# Patient Record
Sex: Female | Born: 1945
Health system: Southern US, Community
[De-identification: ages and names within clinical notes are randomized; demographics above are authoritative.]

## PROBLEM LIST (undated history)

## (undated) DIAGNOSIS — I639 Cerebral infarction, unspecified: Secondary | ICD-10-CM

## (undated) DIAGNOSIS — G629 Polyneuropathy, unspecified: Secondary | ICD-10-CM

## (undated) DIAGNOSIS — I509 Heart failure, unspecified: Secondary | ICD-10-CM

## (undated) DIAGNOSIS — D649 Anemia, unspecified: Secondary | ICD-10-CM

## (undated) DIAGNOSIS — E559 Vitamin D deficiency, unspecified: Secondary | ICD-10-CM

## (undated) DIAGNOSIS — N189 Chronic kidney disease, unspecified: Secondary | ICD-10-CM

## (undated) DIAGNOSIS — M545 Low back pain, unspecified: Secondary | ICD-10-CM

## (undated) DIAGNOSIS — E669 Obesity, unspecified: Secondary | ICD-10-CM

## (undated) DIAGNOSIS — I219 Acute myocardial infarction, unspecified: Secondary | ICD-10-CM

## (undated) DIAGNOSIS — H269 Unspecified cataract: Secondary | ICD-10-CM

## (undated) DIAGNOSIS — M1990 Unspecified osteoarthritis, unspecified site: Secondary | ICD-10-CM

## (undated) DIAGNOSIS — J45909 Unspecified asthma, uncomplicated: Secondary | ICD-10-CM

## (undated) DIAGNOSIS — K219 Gastro-esophageal reflux disease without esophagitis: Secondary | ICD-10-CM

## (undated) DIAGNOSIS — E46 Unspecified protein-calorie malnutrition: Secondary | ICD-10-CM

## (undated) DIAGNOSIS — K5900 Constipation, unspecified: Secondary | ICD-10-CM

## (undated) DIAGNOSIS — G4733 Obstructive sleep apnea (adult) (pediatric): Secondary | ICD-10-CM

## (undated) DIAGNOSIS — R609 Edema, unspecified: Secondary | ICD-10-CM

## (undated) DIAGNOSIS — E785 Hyperlipidemia, unspecified: Secondary | ICD-10-CM

## (undated) DIAGNOSIS — R202 Paresthesia of skin: Secondary | ICD-10-CM

## (undated) DIAGNOSIS — M25572 Pain in left ankle and joints of left foot: Secondary | ICD-10-CM

## (undated) DIAGNOSIS — Z9981 Dependence on supplemental oxygen: Secondary | ICD-10-CM

## (undated) DIAGNOSIS — I10 Essential (primary) hypertension: Secondary | ICD-10-CM

## (undated) DIAGNOSIS — E119 Type 2 diabetes mellitus without complications: Secondary | ICD-10-CM

## (undated) DIAGNOSIS — T7840XA Allergy, unspecified, initial encounter: Secondary | ICD-10-CM

## (undated) HISTORY — DX: Anemia, unspecified: D64.9

## (undated) HISTORY — DX: Unspecified cataract: H26.9

## (undated) HISTORY — DX: Dependence on supplemental oxygen: Z99.81

## (undated) HISTORY — DX: Gastro-esophageal reflux disease without esophagitis: K21.9

## (undated) HISTORY — DX: Low back pain: M54.5

## (undated) HISTORY — DX: Pain in left ankle and joints of left foot: M25.572

## (undated) HISTORY — PX: ANKLE SURGERY: SHX546

## (undated) HISTORY — DX: Obstructive sleep apnea (adult) (pediatric): G47.33

## (undated) HISTORY — PX: FRACTURE SURGERY: SHX138

## (undated) HISTORY — DX: Chronic kidney disease, unspecified: N18.9

## (undated) HISTORY — DX: Unspecified protein-calorie malnutrition: E46

## (undated) HISTORY — DX: Obesity, unspecified: E66.9

## (undated) HISTORY — DX: Constipation, unspecified: K59.00

## (undated) HISTORY — DX: Low back pain, unspecified: M54.50

## (undated) HISTORY — DX: Type 2 diabetes mellitus without complications: E11.9

## (undated) HISTORY — PX: COLONOSCOPY: SHX174

## (undated) HISTORY — DX: Heart failure, unspecified: I50.9

## (undated) HISTORY — DX: Essential (primary) hypertension: I10

## (undated) HISTORY — DX: Unspecified asthma, uncomplicated: J45.909

## (undated) HISTORY — DX: Unspecified osteoarthritis, unspecified site: M19.90

## (undated) HISTORY — DX: Hyperlipidemia, unspecified: E78.5

## (undated) HISTORY — DX: Vitamin D deficiency, unspecified: E55.9

## (undated) HISTORY — DX: Allergy, unspecified, initial encounter: T78.40XA

## (undated) HISTORY — DX: Paresthesia of skin: R20.2

---

## 2006-02-03 ENCOUNTER — Emergency Department: Payer: Self-pay | Admitting: Emergency Medicine

## 2006-02-03 ENCOUNTER — Other Ambulatory Visit: Payer: Self-pay

## 2008-08-16 LAB — HM DEXA SCAN

## 2009-12-18 DIAGNOSIS — E113299 Type 2 diabetes mellitus with mild nonproliferative diabetic retinopathy without macular edema, unspecified eye: Secondary | ICD-10-CM

## 2010-04-16 ENCOUNTER — Ambulatory Visit: Payer: Self-pay | Admitting: Family Medicine

## 2010-04-16 DIAGNOSIS — H269 Unspecified cataract: Secondary | ICD-10-CM

## 2010-04-22 ENCOUNTER — Inpatient Hospital Stay: Payer: Self-pay | Admitting: Internal Medicine

## 2010-05-08 ENCOUNTER — Ambulatory Visit: Payer: Self-pay | Admitting: Family Medicine

## 2010-07-09 ENCOUNTER — Ambulatory Visit: Payer: Self-pay | Admitting: Family Medicine

## 2013-11-23 ENCOUNTER — Inpatient Hospital Stay: Payer: Self-pay | Admitting: Internal Medicine

## 2013-11-23 DIAGNOSIS — N189 Chronic kidney disease, unspecified: Secondary | ICD-10-CM | POA: Insufficient documentation

## 2013-11-23 DIAGNOSIS — N179 Acute kidney failure, unspecified: Secondary | ICD-10-CM | POA: Insufficient documentation

## 2013-11-23 LAB — CBC
HCT: 28.3 % — ABNORMAL LOW (ref 35.0–47.0)
HGB: 8.7 g/dL — AB (ref 12.0–16.0)
MCH: 23.4 pg — ABNORMAL LOW (ref 26.0–34.0)
MCHC: 30.8 g/dL — AB (ref 32.0–36.0)
MCV: 76 fL — ABNORMAL LOW (ref 80–100)
Platelet: 290 10*3/uL (ref 150–440)
RBC: 3.71 10*6/uL — AB (ref 3.80–5.20)
RDW: 17.9 % — ABNORMAL HIGH (ref 11.5–14.5)
WBC: 10.4 10*3/uL (ref 3.6–11.0)

## 2013-11-23 LAB — BASIC METABOLIC PANEL
ANION GAP: 4 — AB (ref 7–16)
BUN: 19 mg/dL — AB (ref 7–18)
Calcium, Total: 8.4 mg/dL — ABNORMAL LOW (ref 8.5–10.1)
Chloride: 106 mmol/L (ref 98–107)
Co2: 32 mmol/L (ref 21–32)
Creatinine: 1.53 mg/dL — ABNORMAL HIGH (ref 0.60–1.30)
EGFR (Non-African Amer.): 35 — ABNORMAL LOW
GFR CALC AF AMER: 40 — AB
Glucose: 197 mg/dL — ABNORMAL HIGH (ref 65–99)
OSMOLALITY: 291 (ref 275–301)
Potassium: 3.7 mmol/L (ref 3.5–5.1)
Sodium: 142 mmol/L (ref 136–145)

## 2013-11-23 LAB — PRO B NATRIURETIC PEPTIDE: B-Type Natriuretic Peptide: 1773 pg/mL — ABNORMAL HIGH (ref 0–125)

## 2013-11-23 LAB — TROPONIN I: Troponin-I: 0.07 ng/mL — ABNORMAL HIGH

## 2013-11-24 LAB — BASIC METABOLIC PANEL
Anion Gap: 3 — ABNORMAL LOW (ref 7–16)
BUN: 19 mg/dL — AB (ref 7–18)
CALCIUM: 8.4 mg/dL — AB (ref 8.5–10.1)
CHLORIDE: 106 mmol/L (ref 98–107)
CO2: 33 mmol/L — AB (ref 21–32)
Creatinine: 1.44 mg/dL — ABNORMAL HIGH (ref 0.60–1.30)
EGFR (African American): 43 — ABNORMAL LOW
EGFR (Non-African Amer.): 37 — ABNORMAL LOW
GLUCOSE: 136 mg/dL — AB (ref 65–99)
Osmolality: 287 (ref 275–301)
Potassium: 4.2 mmol/L (ref 3.5–5.1)
Sodium: 142 mmol/L (ref 136–145)

## 2013-11-24 LAB — CBC WITH DIFFERENTIAL/PLATELET
Basophil #: 0 10*3/uL (ref 0.0–0.1)
Basophil %: 0.3 %
Eosinophil #: 0.1 10*3/uL (ref 0.0–0.7)
Eosinophil %: 1 %
HCT: 23.4 % — ABNORMAL LOW (ref 35.0–47.0)
HGB: 7.3 g/dL — AB (ref 12.0–16.0)
Lymphocyte #: 1.2 10*3/uL (ref 1.0–3.6)
Lymphocyte %: 11.7 %
MCH: 23.5 pg — AB (ref 26.0–34.0)
MCHC: 31 g/dL — ABNORMAL LOW (ref 32.0–36.0)
MCV: 76 fL — ABNORMAL LOW (ref 80–100)
MONO ABS: 0.7 x10 3/mm (ref 0.2–0.9)
MONOS PCT: 7 %
NEUTROS PCT: 80 %
Neutrophil #: 8.4 10*3/uL — ABNORMAL HIGH (ref 1.4–6.5)
Platelet: 259 10*3/uL (ref 150–440)
RBC: 3.08 10*6/uL — AB (ref 3.80–5.20)
RDW: 18 % — AB (ref 11.5–14.5)
WBC: 10.6 10*3/uL (ref 3.6–11.0)

## 2013-11-24 LAB — TROPONIN I
TROPONIN-I: 0.06 ng/mL — AB
TROPONIN-I: 0.06 ng/mL — AB

## 2013-11-24 LAB — CK-MB
CK-MB: 1.1 ng/mL (ref 0.5–3.6)
CK-MB: 0.9 ng/mL (ref 0.5–3.6)
CK-MB: 0.7 ng/mL (ref 0.5–3.6)

## 2013-11-24 LAB — HEMOGLOBIN A1C: Hemoglobin A1C: 7.1 % — ABNORMAL HIGH (ref 4.2–6.3)

## 2013-11-24 LAB — MAGNESIUM: Magnesium: 1.8 mg/dL

## 2013-11-25 LAB — BASIC METABOLIC PANEL
ANION GAP: 4 — AB (ref 7–16)
BUN: 22 mg/dL — AB (ref 7–18)
CALCIUM: 8.3 mg/dL — AB (ref 8.5–10.1)
Chloride: 104 mmol/L (ref 98–107)
Co2: 34 mmol/L — ABNORMAL HIGH (ref 21–32)
Creatinine: 1.76 mg/dL — ABNORMAL HIGH (ref 0.60–1.30)
EGFR (African American): 34 — ABNORMAL LOW
EGFR (Non-African Amer.): 29 — ABNORMAL LOW
GLUCOSE: 135 mg/dL — AB (ref 65–99)
OSMOLALITY: 288 (ref 275–301)
Potassium: 4.1 mmol/L (ref 3.5–5.1)
Sodium: 142 mmol/L (ref 136–145)

## 2013-11-25 LAB — CBC WITH DIFFERENTIAL/PLATELET
Basophil #: 0 10*3/uL (ref 0.0–0.1)
Basophil %: 0.5 %
Eosinophil #: 0.2 10*3/uL (ref 0.0–0.7)
Eosinophil %: 2.6 %
HCT: 24.2 % — ABNORMAL LOW (ref 35.0–47.0)
HGB: 7.4 g/dL — AB (ref 12.0–16.0)
LYMPHS ABS: 1.3 10*3/uL (ref 1.0–3.6)
LYMPHS PCT: 16.2 %
MCH: 23.5 pg — ABNORMAL LOW (ref 26.0–34.0)
MCHC: 30.5 g/dL — AB (ref 32.0–36.0)
MCV: 77 fL — ABNORMAL LOW (ref 80–100)
Monocyte #: 0.7 x10 3/mm (ref 0.2–0.9)
Monocyte %: 8.8 %
NEUTROS PCT: 71.9 %
Neutrophil #: 5.7 10*3/uL (ref 1.4–6.5)
Platelet: 252 10*3/uL (ref 150–440)
RBC: 3.14 10*6/uL — ABNORMAL LOW (ref 3.80–5.20)
RDW: 18.2 % — AB (ref 11.5–14.5)
WBC: 8 10*3/uL (ref 3.6–11.0)

## 2013-11-26 LAB — CBC WITH DIFFERENTIAL/PLATELET
BASOS ABS: 0 10*3/uL (ref 0.0–0.1)
Basophil %: 0.4 %
EOS ABS: 0.3 10*3/uL (ref 0.0–0.7)
EOS PCT: 3.3 %
HCT: 24.9 % — AB (ref 35.0–47.0)
HGB: 7.5 g/dL — ABNORMAL LOW (ref 12.0–16.0)
LYMPHS ABS: 1.3 10*3/uL (ref 1.0–3.6)
LYMPHS PCT: 14.9 %
MCH: 23.2 pg — ABNORMAL LOW (ref 26.0–34.0)
MCHC: 30.3 g/dL — ABNORMAL LOW (ref 32.0–36.0)
MCV: 77 fL — AB (ref 80–100)
Monocyte #: 0.9 x10 3/mm (ref 0.2–0.9)
Monocyte %: 10.1 %
Neutrophil #: 6.1 10*3/uL (ref 1.4–6.5)
Neutrophil %: 71.3 %
PLATELETS: 266 10*3/uL (ref 150–440)
RBC: 3.25 10*6/uL — ABNORMAL LOW (ref 3.80–5.20)
RDW: 17.5 % — ABNORMAL HIGH (ref 11.5–14.5)
WBC: 8.6 10*3/uL (ref 3.6–11.0)

## 2013-11-26 LAB — BASIC METABOLIC PANEL
ANION GAP: 1 — AB (ref 7–16)
BUN: 29 mg/dL — AB (ref 7–18)
CHLORIDE: 104 mmol/L (ref 98–107)
Calcium, Total: 8.2 mg/dL — ABNORMAL LOW (ref 8.5–10.1)
Co2: 33 mmol/L — ABNORMAL HIGH (ref 21–32)
Creatinine: 2.03 mg/dL — ABNORMAL HIGH (ref 0.60–1.30)
EGFR (African American): 29 — ABNORMAL LOW
EGFR (Non-African Amer.): 25 — ABNORMAL LOW
GLUCOSE: 164 mg/dL — AB (ref 65–99)
Osmolality: 285 (ref 275–301)
Potassium: 4.3 mmol/L (ref 3.5–5.1)
Sodium: 138 mmol/L (ref 136–145)

## 2013-11-27 LAB — BASIC METABOLIC PANEL
Anion Gap: 3 — ABNORMAL LOW (ref 7–16)
BUN: 35 mg/dL — AB (ref 7–18)
CHLORIDE: 101 mmol/L (ref 98–107)
CO2: 35 mmol/L — AB (ref 21–32)
Calcium, Total: 8.6 mg/dL (ref 8.5–10.1)
Creatinine: 1.75 mg/dL — ABNORMAL HIGH (ref 0.60–1.30)
GFR CALC AF AMER: 34 — AB
GFR CALC NON AF AMER: 30 — AB
Glucose: 185 mg/dL — ABNORMAL HIGH (ref 65–99)
OSMOLALITY: 290 (ref 275–301)
Potassium: 4.4 mmol/L (ref 3.5–5.1)
Sodium: 139 mmol/L (ref 136–145)

## 2013-11-27 LAB — CBC WITH DIFFERENTIAL/PLATELET
Basophil #: 0 10*3/uL (ref 0.0–0.1)
Basophil %: 0.2 %
EOS ABS: 0 10*3/uL (ref 0.0–0.7)
Eosinophil %: 0.4 %
HCT: 26.7 % — ABNORMAL LOW (ref 35.0–47.0)
HGB: 8.1 g/dL — AB (ref 12.0–16.0)
LYMPHS ABS: 0.3 10*3/uL — AB (ref 1.0–3.6)
Lymphocyte %: 3 %
MCH: 23.4 pg — ABNORMAL LOW (ref 26.0–34.0)
MCHC: 30.5 g/dL — ABNORMAL LOW (ref 32.0–36.0)
MCV: 77 fL — ABNORMAL LOW (ref 80–100)
MONO ABS: 0.2 x10 3/mm (ref 0.2–0.9)
Monocyte %: 1.8 %
NEUTROS ABS: 11.2 10*3/uL — AB (ref 1.4–6.5)
Neutrophil %: 94.6 %
Platelet: 253 10*3/uL (ref 150–440)
RBC: 3.47 10*6/uL — ABNORMAL LOW (ref 3.80–5.20)
RDW: 17.6 % — ABNORMAL HIGH (ref 11.5–14.5)
WBC: 11.8 10*3/uL — ABNORMAL HIGH (ref 3.6–11.0)

## 2013-11-27 LAB — APTT: ACTIVATED PTT: 35.6 s (ref 23.6–35.9)

## 2013-11-28 LAB — CBC WITH DIFFERENTIAL/PLATELET
BASOS PCT: 0.2 %
Basophil #: 0 10*3/uL (ref 0.0–0.1)
EOS PCT: 0.2 %
Eosinophil #: 0 10*3/uL (ref 0.0–0.7)
HCT: 24.6 % — ABNORMAL LOW (ref 35.0–47.0)
HGB: 7.4 g/dL — ABNORMAL LOW (ref 12.0–16.0)
LYMPHS ABS: 0.4 10*3/uL — AB (ref 1.0–3.6)
LYMPHS PCT: 5.4 %
MCH: 23.1 pg — ABNORMAL LOW (ref 26.0–34.0)
MCHC: 30.2 g/dL — AB (ref 32.0–36.0)
MCV: 77 fL — ABNORMAL LOW (ref 80–100)
Monocyte #: 0.1 x10 3/mm — ABNORMAL LOW (ref 0.2–0.9)
Monocyte %: 2.2 %
NEUTROS ABS: 6.3 10*3/uL (ref 1.4–6.5)
Neutrophil %: 92 %
Platelet: 241 10*3/uL (ref 150–440)
RBC: 3.21 10*6/uL — ABNORMAL LOW (ref 3.80–5.20)
RDW: 17.4 % — ABNORMAL HIGH (ref 11.5–14.5)
WBC: 6.8 10*3/uL (ref 3.6–11.0)

## 2013-11-28 LAB — BASIC METABOLIC PANEL
ANION GAP: 4 — AB (ref 7–16)
BUN: 44 mg/dL — ABNORMAL HIGH (ref 7–18)
CREATININE: 1.96 mg/dL — AB (ref 0.60–1.30)
Calcium, Total: 8.7 mg/dL (ref 8.5–10.1)
Chloride: 99 mmol/L (ref 98–107)
Co2: 33 mmol/L — ABNORMAL HIGH (ref 21–32)
EGFR (African American): 30 — ABNORMAL LOW
EGFR (Non-African Amer.): 26 — ABNORMAL LOW
GLUCOSE: 281 mg/dL — AB (ref 65–99)
Osmolality: 293 (ref 275–301)
Potassium: 5 mmol/L (ref 3.5–5.1)
SODIUM: 136 mmol/L (ref 136–145)

## 2014-04-08 ENCOUNTER — Inpatient Hospital Stay: Payer: Self-pay | Admitting: Internal Medicine

## 2014-04-08 LAB — COMPREHENSIVE METABOLIC PANEL
ALBUMIN: 2.7 g/dL — AB (ref 3.4–5.0)
ALT: 24 U/L
Alkaline Phosphatase: 125 U/L — ABNORMAL HIGH
Anion Gap: 7 (ref 7–16)
BUN: 30 mg/dL — AB (ref 7–18)
Bilirubin,Total: 1 mg/dL (ref 0.2–1.0)
CO2: 32 mmol/L (ref 21–32)
CREATININE: 1.44 mg/dL — AB (ref 0.60–1.30)
Calcium, Total: 8.8 mg/dL (ref 8.5–10.1)
Chloride: 107 mmol/L (ref 98–107)
EGFR (African American): 47 — ABNORMAL LOW
EGFR (Non-African Amer.): 38 — ABNORMAL LOW
Glucose: 110 mg/dL — ABNORMAL HIGH (ref 65–99)
Osmolality: 297 (ref 275–301)
Potassium: 3.8 mmol/L (ref 3.5–5.1)
SGOT(AST): 16 U/L (ref 15–37)
Sodium: 146 mmol/L — ABNORMAL HIGH (ref 136–145)
Total Protein: 8.1 g/dL (ref 6.4–8.2)

## 2014-04-08 LAB — IRON AND TIBC
IRON BIND. CAP.(TOTAL): 271 ug/dL (ref 250–450)
IRON: 21 ug/dL — AB (ref 50–170)
Iron Saturation: 8 %
UNBOUND IRON-BIND. CAP.: 250 ug/dL

## 2014-04-08 LAB — CBC
HCT: 28.3 % — ABNORMAL LOW (ref 35.0–47.0)
HGB: 8.5 g/dL — ABNORMAL LOW (ref 12.0–16.0)
MCH: 20.9 pg — AB (ref 26.0–34.0)
MCHC: 30 g/dL — ABNORMAL LOW (ref 32.0–36.0)
MCV: 70 fL — AB (ref 80–100)
PLATELETS: 289 10*3/uL (ref 150–440)
RBC: 4.06 10*6/uL (ref 3.80–5.20)
RDW: 19.7 % — AB (ref 11.5–14.5)
WBC: 14.7 10*3/uL — ABNORMAL HIGH (ref 3.6–11.0)

## 2014-04-08 LAB — CK TOTAL AND CKMB (NOT AT ARMC)
CK, TOTAL: 57 U/L
CK, TOTAL: 52 U/L
CK, Total: 60 U/L
CK-MB: 0.6 ng/mL (ref 0.5–3.6)
CK-MB: 0.8 ng/mL (ref 0.5–3.6)
CK-MB: 0.6 ng/mL (ref 0.5–3.6)

## 2014-04-08 LAB — TROPONIN I
TROPONIN-I: 0.05 ng/mL
Troponin-I: 0.06 ng/mL — ABNORMAL HIGH
Troponin-I: 0.06 ng/mL — ABNORMAL HIGH

## 2014-04-08 LAB — PRO B NATRIURETIC PEPTIDE: B-TYPE NATIURETIC PEPTID: 1974 pg/mL — AB (ref 0–125)

## 2014-04-08 LAB — FERRITIN: Ferritin (ARMC): 203 ng/mL (ref 8–388)

## 2014-04-09 LAB — CBC WITH DIFFERENTIAL/PLATELET
BASOS ABS: 0.1 10*3/uL (ref 0.0–0.1)
BASOS PCT: 0.5 %
Eosinophil #: 0.2 10*3/uL (ref 0.0–0.7)
Eosinophil %: 1.2 %
HCT: 25.6 % — ABNORMAL LOW (ref 35.0–47.0)
HGB: 7.7 g/dL — ABNORMAL LOW (ref 12.0–16.0)
Lymphocyte #: 1.5 10*3/uL (ref 1.0–3.6)
Lymphocyte %: 11.1 %
MCH: 21.2 pg — ABNORMAL LOW (ref 26.0–34.0)
MCHC: 30.3 g/dL — ABNORMAL LOW (ref 32.0–36.0)
MCV: 70 fL — ABNORMAL LOW (ref 80–100)
Monocyte #: 1 x10 3/mm — ABNORMAL HIGH (ref 0.2–0.9)
Monocyte %: 7.6 %
NEUTROS ABS: 10.5 10*3/uL — AB (ref 1.4–6.5)
NEUTROS PCT: 79.6 %
Platelet: 305 10*3/uL (ref 150–440)
RBC: 3.66 10*6/uL — ABNORMAL LOW (ref 3.80–5.20)
RDW: 19.8 % — ABNORMAL HIGH (ref 11.5–14.5)
WBC: 13.1 10*3/uL — ABNORMAL HIGH (ref 3.6–11.0)

## 2014-04-09 LAB — BASIC METABOLIC PANEL
ANION GAP: 6 — AB (ref 7–16)
BUN: 33 mg/dL — AB (ref 7–18)
CO2: 31 mmol/L (ref 21–32)
Calcium, Total: 8.5 mg/dL (ref 8.5–10.1)
Chloride: 107 mmol/L (ref 98–107)
Creatinine: 1.35 mg/dL — ABNORMAL HIGH (ref 0.60–1.30)
EGFR (African American): 50 — ABNORMAL LOW
EGFR (Non-African Amer.): 41 — ABNORMAL LOW
Glucose: 98 mg/dL (ref 65–99)
Osmolality: 294 (ref 275–301)
Potassium: 4 mmol/L (ref 3.5–5.1)
Sodium: 144 mmol/L (ref 136–145)

## 2014-04-09 LAB — HEMOGLOBIN A1C: Hemoglobin A1C: 6.2 % (ref 4.2–6.3)

## 2014-04-10 LAB — BASIC METABOLIC PANEL
ANION GAP: 4 — AB (ref 7–16)
BUN: 35 mg/dL — ABNORMAL HIGH (ref 7–18)
CHLORIDE: 105 mmol/L (ref 98–107)
Calcium, Total: 8.3 mg/dL — ABNORMAL LOW (ref 8.5–10.1)
Co2: 36 mmol/L — ABNORMAL HIGH (ref 21–32)
Creatinine: 1.74 mg/dL — ABNORMAL HIGH (ref 0.60–1.30)
GFR CALC AF AMER: 37 — AB
GFR CALC NON AF AMER: 31 — AB
GLUCOSE: 116 mg/dL — AB (ref 65–99)
Osmolality: 298 (ref 275–301)
POTASSIUM: 3.7 mmol/L (ref 3.5–5.1)
SODIUM: 145 mmol/L (ref 136–145)

## 2014-04-10 LAB — MAGNESIUM: Magnesium: 1.6 mg/dL — ABNORMAL LOW

## 2014-04-11 LAB — BASIC METABOLIC PANEL
Anion Gap: 4 — ABNORMAL LOW (ref 7–16)
BUN: 33 mg/dL — ABNORMAL HIGH (ref 7–18)
CALCIUM: 8.1 mg/dL — AB (ref 8.5–10.1)
CO2: 36 mmol/L — AB (ref 21–32)
Chloride: 103 mmol/L (ref 98–107)
Creatinine: 1.76 mg/dL — ABNORMAL HIGH (ref 0.60–1.30)
EGFR (Non-African Amer.): 31 — ABNORMAL LOW
GFR CALC AF AMER: 37 — AB
Glucose: 128 mg/dL — ABNORMAL HIGH (ref 65–99)
Osmolality: 294 (ref 275–301)
POTASSIUM: 3.7 mmol/L (ref 3.5–5.1)
SODIUM: 143 mmol/L (ref 136–145)

## 2014-04-11 LAB — CBC WITH DIFFERENTIAL/PLATELET
BASOS PCT: 0.3 %
Basophil #: 0 10*3/uL (ref 0.0–0.1)
EOS PCT: 2.3 %
Eosinophil #: 0.2 10*3/uL (ref 0.0–0.7)
HCT: 25.1 % — ABNORMAL LOW (ref 35.0–47.0)
HGB: 7.6 g/dL — AB (ref 12.0–16.0)
LYMPHS PCT: 12.5 %
Lymphocyte #: 1.3 10*3/uL (ref 1.0–3.6)
MCH: 21.2 pg — AB (ref 26.0–34.0)
MCHC: 30.1 g/dL — ABNORMAL LOW (ref 32.0–36.0)
MCV: 70 fL — ABNORMAL LOW (ref 80–100)
MONOS PCT: 8.6 %
Monocyte #: 0.9 x10 3/mm (ref 0.2–0.9)
NEUTROS ABS: 7.8 10*3/uL — AB (ref 1.4–6.5)
Neutrophil %: 76.3 %
Platelet: 309 10*3/uL (ref 150–440)
RBC: 3.57 10*6/uL — AB (ref 3.80–5.20)
RDW: 19.2 % — AB (ref 11.5–14.5)
WBC: 10.2 10*3/uL (ref 3.6–11.0)

## 2014-04-11 LAB — MAGNESIUM: Magnesium: 2 mg/dL

## 2014-07-14 DIAGNOSIS — M545 Low back pain: Secondary | ICD-10-CM | POA: Diagnosis not present

## 2014-07-15 DIAGNOSIS — M545 Low back pain: Secondary | ICD-10-CM | POA: Diagnosis not present

## 2014-07-16 DIAGNOSIS — M545 Low back pain: Secondary | ICD-10-CM | POA: Diagnosis not present

## 2014-07-17 DIAGNOSIS — M545 Low back pain: Secondary | ICD-10-CM | POA: Diagnosis not present

## 2014-07-18 DIAGNOSIS — M545 Low back pain: Secondary | ICD-10-CM | POA: Diagnosis not present

## 2014-07-19 DIAGNOSIS — M545 Low back pain: Secondary | ICD-10-CM | POA: Diagnosis not present

## 2014-07-20 DIAGNOSIS — M545 Low back pain: Secondary | ICD-10-CM | POA: Diagnosis not present

## 2014-07-21 DIAGNOSIS — M545 Low back pain: Secondary | ICD-10-CM | POA: Diagnosis not present

## 2014-07-22 DIAGNOSIS — M545 Low back pain: Secondary | ICD-10-CM | POA: Diagnosis not present

## 2014-07-23 DIAGNOSIS — M545 Low back pain: Secondary | ICD-10-CM | POA: Diagnosis not present

## 2014-07-24 DIAGNOSIS — M545 Low back pain: Secondary | ICD-10-CM | POA: Diagnosis not present

## 2014-07-25 DIAGNOSIS — M545 Low back pain: Secondary | ICD-10-CM | POA: Diagnosis not present

## 2014-07-26 DIAGNOSIS — M545 Low back pain: Secondary | ICD-10-CM | POA: Diagnosis not present

## 2014-07-27 DIAGNOSIS — M545 Low back pain: Secondary | ICD-10-CM | POA: Diagnosis not present

## 2014-07-28 DIAGNOSIS — M545 Low back pain: Secondary | ICD-10-CM | POA: Diagnosis not present

## 2014-07-29 DIAGNOSIS — M545 Low back pain: Secondary | ICD-10-CM | POA: Diagnosis not present

## 2014-07-30 DIAGNOSIS — M545 Low back pain: Secondary | ICD-10-CM | POA: Diagnosis not present

## 2014-07-31 DIAGNOSIS — M545 Low back pain: Secondary | ICD-10-CM | POA: Diagnosis not present

## 2014-08-01 DIAGNOSIS — M545 Low back pain: Secondary | ICD-10-CM | POA: Diagnosis not present

## 2014-08-02 DIAGNOSIS — M545 Low back pain: Secondary | ICD-10-CM | POA: Diagnosis not present

## 2014-08-03 DIAGNOSIS — M545 Low back pain: Secondary | ICD-10-CM | POA: Diagnosis not present

## 2014-08-04 DIAGNOSIS — M545 Low back pain: Secondary | ICD-10-CM | POA: Diagnosis not present

## 2014-08-05 DIAGNOSIS — M545 Low back pain: Secondary | ICD-10-CM | POA: Diagnosis not present

## 2014-08-06 DIAGNOSIS — M545 Low back pain: Secondary | ICD-10-CM | POA: Diagnosis not present

## 2014-08-07 DIAGNOSIS — M545 Low back pain: Secondary | ICD-10-CM | POA: Diagnosis not present

## 2014-08-08 DIAGNOSIS — M545 Low back pain: Secondary | ICD-10-CM | POA: Diagnosis not present

## 2014-08-09 DIAGNOSIS — M545 Low back pain: Secondary | ICD-10-CM | POA: Diagnosis not present

## 2014-08-10 DIAGNOSIS — M545 Low back pain: Secondary | ICD-10-CM | POA: Diagnosis not present

## 2014-08-11 DIAGNOSIS — M545 Low back pain: Secondary | ICD-10-CM | POA: Diagnosis not present

## 2014-08-12 DIAGNOSIS — M545 Low back pain: Secondary | ICD-10-CM | POA: Diagnosis not present

## 2014-08-13 DIAGNOSIS — M545 Low back pain: Secondary | ICD-10-CM | POA: Diagnosis not present

## 2014-08-14 DIAGNOSIS — M545 Low back pain: Secondary | ICD-10-CM | POA: Diagnosis not present

## 2014-08-15 DIAGNOSIS — M545 Low back pain: Secondary | ICD-10-CM | POA: Diagnosis not present

## 2014-08-16 DIAGNOSIS — M545 Low back pain: Secondary | ICD-10-CM | POA: Diagnosis not present

## 2014-08-17 DIAGNOSIS — M545 Low back pain: Secondary | ICD-10-CM | POA: Diagnosis not present

## 2014-08-18 DIAGNOSIS — M545 Low back pain: Secondary | ICD-10-CM | POA: Diagnosis not present

## 2014-08-19 DIAGNOSIS — M545 Low back pain: Secondary | ICD-10-CM | POA: Diagnosis not present

## 2014-08-20 DIAGNOSIS — M545 Low back pain: Secondary | ICD-10-CM | POA: Diagnosis not present

## 2014-08-21 DIAGNOSIS — M545 Low back pain: Secondary | ICD-10-CM | POA: Diagnosis not present

## 2014-08-22 DIAGNOSIS — M545 Low back pain: Secondary | ICD-10-CM | POA: Diagnosis not present

## 2014-08-23 DIAGNOSIS — M545 Low back pain: Secondary | ICD-10-CM | POA: Diagnosis not present

## 2014-08-24 DIAGNOSIS — M545 Low back pain: Secondary | ICD-10-CM | POA: Diagnosis not present

## 2014-08-25 DIAGNOSIS — M545 Low back pain: Secondary | ICD-10-CM | POA: Diagnosis not present

## 2014-08-26 DIAGNOSIS — M545 Low back pain: Secondary | ICD-10-CM | POA: Diagnosis not present

## 2014-08-27 DIAGNOSIS — M545 Low back pain: Secondary | ICD-10-CM | POA: Diagnosis not present

## 2014-08-28 DIAGNOSIS — M545 Low back pain: Secondary | ICD-10-CM | POA: Diagnosis not present

## 2014-08-29 DIAGNOSIS — M545 Low back pain: Secondary | ICD-10-CM | POA: Diagnosis not present

## 2014-08-30 DIAGNOSIS — M545 Low back pain: Secondary | ICD-10-CM | POA: Diagnosis not present

## 2014-08-31 DIAGNOSIS — M545 Low back pain: Secondary | ICD-10-CM | POA: Diagnosis not present

## 2014-09-01 DIAGNOSIS — M545 Low back pain: Secondary | ICD-10-CM | POA: Diagnosis not present

## 2014-09-02 DIAGNOSIS — M545 Low back pain: Secondary | ICD-10-CM | POA: Diagnosis not present

## 2014-09-03 DIAGNOSIS — M545 Low back pain: Secondary | ICD-10-CM | POA: Diagnosis not present

## 2014-09-04 DIAGNOSIS — M545 Low back pain: Secondary | ICD-10-CM | POA: Diagnosis not present

## 2014-09-05 DIAGNOSIS — M545 Low back pain: Secondary | ICD-10-CM | POA: Diagnosis not present

## 2014-09-06 DIAGNOSIS — M545 Low back pain: Secondary | ICD-10-CM | POA: Diagnosis not present

## 2014-09-07 DIAGNOSIS — M545 Low back pain: Secondary | ICD-10-CM | POA: Diagnosis not present

## 2014-09-08 DIAGNOSIS — M545 Low back pain: Secondary | ICD-10-CM | POA: Diagnosis not present

## 2014-09-09 DIAGNOSIS — M545 Low back pain: Secondary | ICD-10-CM | POA: Diagnosis not present

## 2014-09-10 DIAGNOSIS — M545 Low back pain: Secondary | ICD-10-CM | POA: Diagnosis not present

## 2014-09-11 DIAGNOSIS — M545 Low back pain: Secondary | ICD-10-CM | POA: Diagnosis not present

## 2014-09-12 DIAGNOSIS — M545 Low back pain: Secondary | ICD-10-CM | POA: Diagnosis not present

## 2014-09-13 DIAGNOSIS — M545 Low back pain: Secondary | ICD-10-CM | POA: Diagnosis not present

## 2014-09-14 DIAGNOSIS — M545 Low back pain: Secondary | ICD-10-CM | POA: Diagnosis not present

## 2014-09-15 DIAGNOSIS — M545 Low back pain: Secondary | ICD-10-CM | POA: Diagnosis not present

## 2014-09-16 DIAGNOSIS — M545 Low back pain: Secondary | ICD-10-CM | POA: Diagnosis not present

## 2014-09-17 DIAGNOSIS — M545 Low back pain: Secondary | ICD-10-CM | POA: Diagnosis not present

## 2014-09-18 DIAGNOSIS — M545 Low back pain: Secondary | ICD-10-CM | POA: Diagnosis not present

## 2014-09-19 DIAGNOSIS — M545 Low back pain: Secondary | ICD-10-CM | POA: Diagnosis not present

## 2014-09-20 DIAGNOSIS — M545 Low back pain: Secondary | ICD-10-CM | POA: Diagnosis not present

## 2014-09-21 DIAGNOSIS — M545 Low back pain: Secondary | ICD-10-CM | POA: Diagnosis not present

## 2014-09-22 DIAGNOSIS — M545 Low back pain: Secondary | ICD-10-CM | POA: Diagnosis not present

## 2014-09-23 DIAGNOSIS — M545 Low back pain: Secondary | ICD-10-CM | POA: Diagnosis not present

## 2014-09-24 DIAGNOSIS — M545 Low back pain: Secondary | ICD-10-CM | POA: Diagnosis not present

## 2014-09-25 DIAGNOSIS — M545 Low back pain: Secondary | ICD-10-CM | POA: Diagnosis not present

## 2014-09-26 DIAGNOSIS — D638 Anemia in other chronic diseases classified elsewhere: Secondary | ICD-10-CM | POA: Diagnosis not present

## 2014-09-26 DIAGNOSIS — M545 Low back pain: Secondary | ICD-10-CM | POA: Diagnosis not present

## 2014-09-26 DIAGNOSIS — J302 Other seasonal allergic rhinitis: Secondary | ICD-10-CM | POA: Diagnosis not present

## 2014-09-26 DIAGNOSIS — E785 Hyperlipidemia, unspecified: Secondary | ICD-10-CM | POA: Diagnosis not present

## 2014-09-26 DIAGNOSIS — I509 Heart failure, unspecified: Secondary | ICD-10-CM | POA: Diagnosis not present

## 2014-09-26 DIAGNOSIS — I10 Essential (primary) hypertension: Secondary | ICD-10-CM | POA: Diagnosis not present

## 2014-09-26 DIAGNOSIS — E1140 Type 2 diabetes mellitus with diabetic neuropathy, unspecified: Secondary | ICD-10-CM | POA: Diagnosis not present

## 2014-09-26 LAB — LIPID PANEL
CHOLESTEROL: 132 mg/dL (ref 0–200)
HDL: 49 mg/dL (ref 35–70)
LDL Cholesterol: 68 mg/dL
Triglycerides: 73 mg/dL (ref 40–160)

## 2014-09-26 LAB — HEMOGLOBIN A1C: Hgb A1c MFr Bld: 5.3 % (ref 4.0–6.0)

## 2014-09-27 DIAGNOSIS — M545 Low back pain: Secondary | ICD-10-CM | POA: Diagnosis not present

## 2014-09-28 DIAGNOSIS — M545 Low back pain: Secondary | ICD-10-CM | POA: Diagnosis not present

## 2014-09-29 DIAGNOSIS — M545 Low back pain: Secondary | ICD-10-CM | POA: Diagnosis not present

## 2014-09-30 DIAGNOSIS — M545 Low back pain: Secondary | ICD-10-CM | POA: Diagnosis not present

## 2014-10-01 DIAGNOSIS — M545 Low back pain: Secondary | ICD-10-CM | POA: Diagnosis not present

## 2014-10-02 DIAGNOSIS — M545 Low back pain: Secondary | ICD-10-CM | POA: Diagnosis not present

## 2014-10-03 DIAGNOSIS — M545 Low back pain: Secondary | ICD-10-CM | POA: Diagnosis not present

## 2014-10-04 DIAGNOSIS — M545 Low back pain: Secondary | ICD-10-CM | POA: Diagnosis not present

## 2014-10-05 DIAGNOSIS — M545 Low back pain: Secondary | ICD-10-CM | POA: Diagnosis not present

## 2014-10-06 DIAGNOSIS — M545 Low back pain: Secondary | ICD-10-CM | POA: Diagnosis not present

## 2014-10-07 DIAGNOSIS — M545 Low back pain: Secondary | ICD-10-CM | POA: Diagnosis not present

## 2014-10-08 DIAGNOSIS — M545 Low back pain: Secondary | ICD-10-CM | POA: Diagnosis not present

## 2014-10-09 DIAGNOSIS — M545 Low back pain: Secondary | ICD-10-CM | POA: Diagnosis not present

## 2014-10-10 DIAGNOSIS — M545 Low back pain: Secondary | ICD-10-CM | POA: Diagnosis not present

## 2014-10-11 DIAGNOSIS — M545 Low back pain: Secondary | ICD-10-CM | POA: Diagnosis not present

## 2014-10-12 DIAGNOSIS — M545 Low back pain: Secondary | ICD-10-CM | POA: Diagnosis not present

## 2014-10-13 DIAGNOSIS — M545 Low back pain: Secondary | ICD-10-CM | POA: Diagnosis not present

## 2014-10-14 DIAGNOSIS — M545 Low back pain: Secondary | ICD-10-CM | POA: Diagnosis not present

## 2014-10-15 DIAGNOSIS — M545 Low back pain: Secondary | ICD-10-CM | POA: Diagnosis not present

## 2014-10-16 DIAGNOSIS — M545 Low back pain: Secondary | ICD-10-CM | POA: Diagnosis not present

## 2014-10-17 DIAGNOSIS — M545 Low back pain: Secondary | ICD-10-CM | POA: Diagnosis not present

## 2014-10-18 DIAGNOSIS — M545 Low back pain: Secondary | ICD-10-CM | POA: Diagnosis not present

## 2014-10-19 DIAGNOSIS — M545 Low back pain: Secondary | ICD-10-CM | POA: Diagnosis not present

## 2014-10-20 DIAGNOSIS — M545 Low back pain: Secondary | ICD-10-CM | POA: Diagnosis not present

## 2014-10-21 DIAGNOSIS — M545 Low back pain: Secondary | ICD-10-CM | POA: Diagnosis not present

## 2014-10-22 DIAGNOSIS — M545 Low back pain: Secondary | ICD-10-CM | POA: Diagnosis not present

## 2014-10-23 DIAGNOSIS — M545 Low back pain: Secondary | ICD-10-CM | POA: Diagnosis not present

## 2014-10-24 DIAGNOSIS — M545 Low back pain: Secondary | ICD-10-CM | POA: Diagnosis not present

## 2014-10-25 DIAGNOSIS — M545 Low back pain: Secondary | ICD-10-CM | POA: Diagnosis not present

## 2014-10-26 DIAGNOSIS — M545 Low back pain: Secondary | ICD-10-CM | POA: Diagnosis not present

## 2014-10-27 DIAGNOSIS — M545 Low back pain: Secondary | ICD-10-CM | POA: Diagnosis not present

## 2014-10-28 DIAGNOSIS — M545 Low back pain: Secondary | ICD-10-CM | POA: Diagnosis not present

## 2014-10-29 DIAGNOSIS — M545 Low back pain: Secondary | ICD-10-CM | POA: Diagnosis not present

## 2014-10-30 DIAGNOSIS — M545 Low back pain: Secondary | ICD-10-CM | POA: Diagnosis not present

## 2014-10-31 DIAGNOSIS — M545 Low back pain: Secondary | ICD-10-CM | POA: Diagnosis not present

## 2014-11-01 DIAGNOSIS — M545 Low back pain: Secondary | ICD-10-CM | POA: Diagnosis not present

## 2014-11-02 DIAGNOSIS — M545 Low back pain: Secondary | ICD-10-CM | POA: Diagnosis not present

## 2014-11-03 DIAGNOSIS — M545 Low back pain: Secondary | ICD-10-CM | POA: Diagnosis not present

## 2014-11-04 DIAGNOSIS — M545 Low back pain: Secondary | ICD-10-CM | POA: Diagnosis not present

## 2014-11-04 NOTE — H&P (Signed)
PATIENT NAME:  Veronica Anderson, Veronica Anderson MR#:  [id number] DATE OF BIRTH:  04-10-1946  DATE OF ADMISSION:  04/08/2014  PRIMARY CARE PHYSICIAN:  Steele Sizer, MD, [facility].  REFERRING EMERGENCY ROOM PHYSICIAN:  Lavonia Drafts, MD   CHIEF COMPLAINT: Five days of increasing shortness of breath, weakness and lower extremity edema.   HISTORY OF PRESENT ILLNESS: This 69 year old woman with a past medical history of diastolic dysfunction, obstructive sleep apnea on CPAP, chronic kidney disease with a baseline creatinine of about 2.5, obesity and diabetes presents today for about 5 days of worsening shortness of breath.  She does weigh daily and has gained 2 to 3 pounds over this time. She has noticed increasing bilateral lower extremity edema.  She has become so weak that she is unable to walk around the room; at baseline, she is able to walk with a cane.  She denies any fevers, chills, chest pain.  No nausea, vomiting, diarrhea.  She states that she has had a runny nose, and watery eyes, which she has been attributing to allergies.  She denies missing any medications. Denies any dietary indiscretion.   PAST MEDICAL HISTORY:  1.  Diastolic dysfunction with last 2-D echocardiogram in 11/2013 showing a preserved ejection fraction of 60% to 65%.  2.  Diabetes mellitus type 2.  3.  Hypertension.  4.  History of CVA with residual left-sided weakness in 1996.  5.  Hyperlipidemia.  6.  Obesity.  7.  Obstructive sleep apnea on CPAP.  8.  Chronic kidney disease stage III.   PAST SURGICAL HISTORY: Ankle surgery.   ALLERGIES: No known drug allergies.   HOME MEDICATIONS:  1.  Slow iron 1 tablet once a day, 45 mg.  2.  Pravastatin 40 mg 1 tablet once a day.  3.  Omeprazole 40 mg 2 capsules once a day in the morning.  4.  NovoLog Flex Pen 10 units subcutaneously 3 times a day before meals.  5.  Montelukast 10 mg oral tablet 1 tablet once a day.  6.  Metoprolol 100 mg 1 tablet twice a day.  7.  Lantus Solostar  injector pen 52 units once a day at bedtime.  8.  Hydralazine 50 mg 1 tablet 3 times a day.  9.  Gabapentin 300 mg 1 capsule 3 times a day.  10. Drisdol 50,000 international units 1 capsule once a week on Saturday.  11. Catapres-TTS-30.3 mg/24 hour transdermal film, 1 patch transdermally once a week on Monday.  12. Bumetanide 2 mg 1 capsule once a day in the morning as needed for shortness of breath or swelling.  13. Aspirin enteric coated 81 mg 1 capsule once a day.  14. Amlodipine 10 mg 1 capsule once a day.    FAMILY HISTORY: Positive for hypertension, congestive heart failure, diabetes and heart disease.    SOCIAL HISTORY: The patient lives at home with her son and nephew in [place].  She does not smoke cigarettes, drink alcohol or use illicit substances.  She is able to ambulate with a cane only.  She is not on oxygen at home.  She is not currently working. She states that she used to work in a [employer].   REVIEW OF SYSTEMS:  CONSTITUTIONAL: Negative for fevers, chills, sweats. Positive for fatigue, weight gain, weakness.  HEENT: No change in hearing or vision. No pain in eyes or ears. No difficulty swallowing. No sore throat.  RESPIRATORY: Positive for shortness of breath, wheezing, orthopnea, runny nose, postnasal drip. No hemoptysis, cough or  sputum production. CARDIOVASCULAR: Negative for chest pain, palpitations, syncope.  GASTROINTESTINAL: No nausea, vomiting, diarrhea, abdominal pain.  GENITOURINARY: No dysuria or frequency.  MUSCULOSKELETAL: No swollen or tender joints. No myalgias, diffuse muscle weakness and decreased activity.  Positive for lower extremity swelling.  NEUROLOGIC: No focal numbness or weakness.  No confusion, no headache, seizure, vertigo. Does have a history of CVA in the past.  PSYCHIATRIC: No change in mood constriction.   PHYSICAL EXAMINATION:  VITAL SIGNS: Temperature 97.6, pulse 62, respirations 18, blood pressure 159/61, oxygenation 99% on 2  liters nasal cannula.   GENERAL: The patient is obese, calm, in no acute distress, laying comfortably in the exam bed.  HEENT: Pupils are equal, round, and reactive to light. Sclera are clear. No injection. No icterus. Extraocular motions are intact, there is no nasal lesions.  No nasal drainage.  Eyes are watering. There are no oral lesions, poor dentition with loose teeth and many missing teeth.  Posterior oropharynx is clear with no exudate or edema.  Trachea is midline. No cervical lymphadenopathy.  RESPIRATORY: There are coarse breath sounds. Short shallow respirations, crackles throughout.  CARDIOVASCULAR: Heart sounds are distant, regular. There is an S3 gallop.  ABDOMEN: Obese, nontender, bowel sounds are normal.  No guarding, no rebound, no hepatosplenomegaly noted.  MUSCULOSKELETAL: There are no hot, tender or swollen joints. No tenderness or effusion.  SKIN: No rash, no obvious wounds. There are scars in the lower extremities from old wounds.  VASCULAR: Peripheral pulses are 2+, there is also 2+ pitting edema, no clubbing, or cyanosis.  NEUROLOGIC: Cranial nerves II through XII are grossly intact.  PSYCHIATRIC: The patient is alert, oriented x 4.  She has good insight. Normal affect. She seems fatigued.   LABORATORIES: BNP is 1974, sodium 146, potassium 3.8, chloride 107, bicarbonate 32, calcium 8.8, serum glucose 110, BUN is 30, creatinine is 1.4.  LFTs are normal with the exception of a low serum albumin at 2.7.  Troponin is 0.06.  White blood cell count 14.7, hemoglobin 8.5, platelets 289,000, MCV 70.    IMAGING: Chest x-ray shows congestive heart failure with pulmonary interstitial edema and bilateral pulmonary effusion.   ASSESSMENT AND PLAN:  1.  Diastolic congestive heart failure exacerbation. The patient has a history of diastolic congestive heart failure, ejection fraction is preserved at 60% to 65% by echocardiogram in 11/2013.  She has not had any chest pain or specific event.   She denies missing medications or dietary indiscretion.  We will admit to telemetry with congestive heart failure protocol for daily weights, low sodium diet.  We will diuresis with additional Lasix.  She may need adjustment of home medications upon discharge.  I have ordered a physical therapy referral.  She will likely need home health nursing to help with daily weights and medication adjustment once she got home.  I will hold off on a repeat 2-D echocardiogram, as there are no major EKG changes and so far cardiac enzymes are very mildly elevated.  Continue metoprolol and aspirin.  2.  Elevated troponins:  Currently troponin elevated at 0.06.  This may be due to congestive heart failure and obesity.  We will continue to monitor.  3.  Anemia with hemoglobin of 8.5.  This is not significantly changed from last measurement in 11/2013.  She also has MCV at 70, which may also be chronic. We will get iron studies as I do not see, that they have been ordered in the past year.  She is on  iron at home.  4.  Diabetes: She reports that her blood sugars have been well-controlled at home.  We will check hemoglobin A1-c and continue home dose Lantus with sliding scale insulin.  5.  Hypertension: Continue her home regimen of Catapres, hydralazine, metoprolol, amlodipine. Hopefully, diuresis will also help to manage her hypertension.  6.  Hyperlipidemia. Continue pravastatin.  7.  History of cerebrovascular accident: Continue with aspirin.  8.  Prophylaxis: Start heparin for deep vein thrombosis prophylaxis, Protonix for management of gastroesophageal reflux disease.    TIME SPENT ON THIS ADMISSION: 45 minutes.      ____________________________ Earleen Newport. Volanda Napoleon, MD cpw:DT D: 04/08/2014 15:44:30 ET T: 04/08/2014 16:15:59 ET JOB#: [id number]  cc: Barnetta Chapel P. Volanda Napoleon, MD, <Dictator>  Aldean Jewett MD ELECTRONICALLY SIGNED 04/09/2014 15:03

## 2014-11-04 NOTE — Consult Note (Signed)
PATIENT NAME:  Veronica Anderson, Veronica Anderson MR#:  [id number] DATE OF BIRTH:  May 27, 1946  DATE OF CONSULTATION:  11/24/2013  CONSULTING PHYSICIAN:  Shamiah Kahler D. Clayborn Bigness, MD  PRIMARY CARE PHYSICIAN:  Dr. Ancil Boozer at [facility].   INDICATION:  Shortness of breath, heart failure, edema.   HISTORY OF PRESENT ILLNESS:  The patient is a 69 year old, morbid obese female with chronic renal insufficiency, baseline of 2.5, creatinine, obstructive sleep apnea, shortness of breath, hypoxemia, and leg edema who states that over the last 3 weeks or so, she has gotten progressively worse and last 4 or 5 days much, much worse with leg edema, shortness of breath and dyspnea. There is no real chest pain, but she has had cough, congestion, elevated BNP in the Emergency Room of 1700, diagnosed with diastolic heart failure in the past, never had a complete workup, had not been seen in over 4 to 5 years, has been treated with CPAP at home and oxygen. She was found to have sats in the 80s so subsequently was advised to be admitted for further evaluation and care. She started to feel better in the Emergency Room with treatment, oxygen and diuretics. Troponins are slightly elevated and she was subsequently admitted for further evaluation and care.   PAST MEDICAL HISTORY:  Diabetes, hypertension, hyperlipidemia, CVA, obstructive sleep apnea, morbid obesity.  PAST SURGICAL HISTORY:  Ankle surgery.   ALLERGIES:  None.   FAMILY HISTORY:  Hypertension, congestive heart failure, coronary artery disease, diabetes.   SOCIAL HISTORY:  Lives at home with her son in [place], not smoking, quit 20 years ago. No alcohol consumption.   MEDICATIONS:  The patient is on aspirin 81 mg a day, Catapres patch every 7 days, Neurontin 300 mg 3 times a day, Lantus 50 units a day, NovoLog subcutaneous, allopurinol 100 daily, simvastatin 40 at bedtime, metoprolol 100 mg twice a day, Hyzaar 25/100 once a day, triplex 135 daily, amlodipine 15 mg daily, Advair  Diskus 10/60 twice a day, metoprolol 100 mg twice a day, Singulair 10 mg orally, iron once a day, omeprazole 20 mg, Bumex 1 mg daily.   REVIEW OF SYSTEMS:  Denies blackout spells, syncope. No nausea or vomiting. No fever, no chills. No sweats. No weight loss or weight gain. No hemoptysis, hematemesis. Denies bright-red blood per rectum. No vision change or hearing change. She has had some mild sputum production and mild cough, shortness of breath, leg edema, mild weakness, fatigue.   PHYSICAL EXAMINATION:  VITAL SIGNS:  Blood pressure when I saw the patient was 170/80, pulse of 65, respiratory rate of 18, afebrile.  HEENT:  Normocephalic, atraumatic. Pupils equal and reactive to light. NECK:  Supple. No significant JVD, bruits or adenopathy.  LUNGS:  Bilateral rhonchi. No significant wheezes. Mild rales in the bases.  HEART:  Distant. Regular rate and rhythm. Systolic ejection murmur at the apex. Positive S4, soft S3.  ABDOMEN:  Benign. Large. No masses. No rebound. No guarding.  EXTREMITIES:  2 to 3+ edema. Good pulses.  NEUROLOGIC:  Grossly intact.  SKIN:  Normal.   LABORATORIES:  Glucose 197. BNP 1700. BUN 19, creatinine 1.53, sodium 142, potassium 3.7, chloride 106, CO2 of 32. Troponin less than 0.07. White count 10, hemoglobin 8.7, hematocrit 28, platelet count of 290.   CHEST X-RAY:  Cardiomegaly, otherwise negative.   ASSESSMENT:  Acute congestive heart failure, shortness of breath, chronic renal insufficiency, anemia, obesity, obstructive sleep apnea, hypertension, diabetes, hyperlipidemia, a history of cerebrovascular accident, borderline troponins.   PLAN:  1.  Recommend admit. Rule out for myocardial infarction. Follow up cardiac enzymes. Followup EKG. Place on telemetry. 2.  Deep vein thrombosis prophylaxis while the patient is in bed.   3.  Continue diuretics for leg edema.  4.  I would recommend Lasix therapy to help with congestion as well as edema in the legs. 5.  Continue  diabetes therapy, sliding scale insulin as necessary. Continue Lantus as well.  6.  For obstructive sleep apnea, recommend CPAP. Consider pulmonary consult.  7.  Obesity, recommend weight loss, exercise, portion control. 8.  Would consider hydralazine therapy to help with heart failure therapy. Consider ACE inhibitor even with renal insufficiency. We will review the echocardiogram and base further recommendation on the results. Maintain a strict _____ as well.   ____________________________ Loran Senters. Clayborn Bigness, MD ddc:jm D: 11/24/2013 16:46:40 ET T: 11/24/2013 17:40:05 ET JOB#: [id number]  cc: Jadore Mcguffin D. Clayborn Bigness, MD, <Dictator> Yolonda Kida MD ELECTRONICALLY SIGNED 01/02/2014 0:55

## 2014-11-04 NOTE — Discharge Summary (Signed)
PATIENT NAME:  Veronica Anderson, Veronica Anderson MR#:  [id number] DATE OF BIRTH:  03/14/1946  DATE OF ADMISSION:  11/23/2013 DATE OF DISCHARGE: 11/28/2013   ADMISSION DIAGNOSIS: Acute congestive heart failure.   DISCHARGE DIAGNOSES: 1.  Shortness of breath secondary to acute diastolic heart failure.  2. Anemia.  3. Accelerated malignant hypertension on arrival. 4. Obstructive sleep apnea.  5. Borderline troponins. 6. History of cerebrovascular accident. 7. Anemia of chronic disease.  8. Probable community-acquired pneumonia.    CONSULTATIONS: Cardiology, nephrology.   IMAGING: The patient had lower extremity Dopplers which are negative for deep vein thrombosis.   2-D echocardiogram showed normal ejection fraction. She does have diastolic dysfunction.   LABORATORY DATA: Discharge sodium 136, potassium 5.0, chloride 99, bicarbonate 33, BUN 44, creatinine 1.96, glucose 281. White blood cells 6.8, hemoglobin 7.4, hematocrit 25, platelets 241.   HOSPITAL COURSE: This 69 year old female with morbid obesity, diabetes, hypertension, obstructive sleep apnea on CPAP with chronic respiratory failure on p.r.n. oxygen, who presented with shortness of breath and lower extremity edema. For further details, please refer to H and P.  1. Shortness of breath due to acute diastolic heart failure. Echo showed diastolic dysfunction with a normal ejection fraction. She was evaluated by cardiology who recommended diuresis as tolerated. She may have also had a pneumonia which is evident by chest x-ray after some clearing of the fluid on her chest x-ray. She has diuresed over 5 liters. Her edema has much improved. She will need some oxygen in the daytime for now due to her significant diastolic dysfunction and obstructive sleep apnea, and she will continue to wear CPAP at night. I empirically started IV steroids, as she was not progressing as hoped, and she did quite well with the regimen of b.i.d. IV Lasix, steroids and Levaquin.  2.  Anemia due to chronic disease.  3. Accelerated malignant hypertension on arrival. Systolic blood pressure greater than 200 secondary to congestive heart failure. Her blood pressure has improved.  4. HCTZ and losartan were discontinued due to acute renal failure. She is on metoprolol and hydralazine.  5. OSA. I discussed the importance of remaining on CPAP because she has diastolic dysfunction, predominantly due to her OSA. 6. Borderline troponin. No chest pain due to chronic kidney disease and demand ischemia from her congestive heart failure.  7. History of CVA with residual left-sided weakness.  8. Diabetes. The patient's A1c was 7.1.  9. The patient's family did not want the patient to go to rehab as recommended by physical therapy. She will go home with home health.  10. Chronic kidney disease. It appears that the patient's creatinine is 1.6 to 2.0. This is her baseline. We did consult nephrology as her creatinine did increase to 2, but she seems pretty stable at this point as far as her kidney function goes.   DISCHARGE MEDICATIONS: 1. Norvasc 10 mg daily.  2. Slow iron 1 tablet daily.  3. Drisdol 50,000 international units weekly.  4. Allopurinol 100 mg daily.  5. Levaquin 750 mg q. 48 hours the next 5 days.    6. Hydralazine 50 mg t.i.d.  7. Bumex 2 mg daily.  8. Prednisone taper starting at 50 mg, taper x 10 mg every two days.  9. Neurontin 300 mg t.i.d.  10. Trilipix 135 mg daily.  11. Catapres 1 patch every seven days/ 12. Singulair 10 mg daily.  13. Omeprazole 20 mg 2 tablets daily.  14. Lantus 50 units at bedtime.  15. NovoLog sliding scale.  16.  Advair Diskus 100/50 b.i.d.  17. Metoprolol 100 mg t.i.d.  18. Simvastatin 20 mg daily.  19. Aspirin 81 mg daily.   DISCHARGE DIET: ADA diet, low sodium.   DISCHARGE ACTIVITY: As tolerated.   DISCHARGE PLAN: The patient will be discharged home with home health care. She did not want to go to skilled nursing facility.   The  patient is medically stable for discharge. The patient will need a follow-up with her primary care physician, Dr. Ancil Boozer in one week, as well as Dr. Holley Raring in one week.   TIME SPENT: 35 minutes.   The patient is medically stable for discharge.   ____________________________ Patsye Sullivant P. Benjie Karvonen, MD spm:sg D: 11/28/2013 11:28:02 ET T: 11/28/2013 12:30:25 ET JOB#: [id number]  cc: Shawnta Schlegel P. Benjie Karvonen, MD, <Dictator> Bethena Roys. Ancil Boozer, MD Tama High, MD  Donell Beers Aura Bibby MD ELECTRONICALLY SIGNED 11/29/2013 12:29

## 2014-11-04 NOTE — Discharge Summary (Signed)
PATIENT NAME:  Veronica Anderson, Veronica Anderson MR#:  [id number] DATE OF BIRTH:  02-02-1946  DATE OF ADMISSION:  04/08/2014 DATE OF DISCHARGE:  04/11/2014  PRIMARY CARE PHYSICIAN: Bethena Roys. Sowles, M.D.     DISCHARGE DIAGNOSES: Acute on chronic diastolic congestive heart failure with ejection fraction of 60% to 65%, hypertension, diabetes, chronic kidney disease stage III, obstructive sleep apnea, morbid obesity.   CONDITION: Stable.   CODE STATUS: Full Code.   HOME MEDICATIONS: Please refer to the medication reconciliation list.   DISCHARGE INSTRUCTIONS: The patient needs home health with physical therapy and a nurse. The patient needs to continue home oxygen 2 to 3 liters and CPAP at night.   DIET: Low-sodium, low-fat, low-cholesterol, ADA diet.   ACTIVITY: As tolerated.   FOLLOW-UP CARE: Follow up with PCP within 1 to 2 weeks.   REASON FOR ADMISSION: Shortness of breath, weakness, and lower extremity edema for 5 days.   HOSPITAL COURSE: The patient is a 69 year old, African American female, with a history of diastolic CHF, CPAP, obstructive sleep apnea, CKD, who was sent to the ED due to worsening shortness of breath for 5 days with leg edema. For detailed history and physical examination, please refer to the admission note dictated by Dr. Volanda Napoleon.   Laboratory data on admission date showed BNP 1974. Electrolytes were normal, BUN 30, creatinine 1.4. Troponin 0.06. WBC 14.7, hemoglobin 8.5.   Chest x-ray showed CHF with pulmonary interstitial edema and bilateral pulmonary effusions.   1. Acute on chronic diastolic CHF. The patient has been treated with Lasix 40 mg IV b.i.d. Lasix is going to be decreased to 40 mg p.o. daily due to a little bit of increased creatinine. The patient needs a followup BMP as outpatient.  2. The patient's symptoms have much improved. She has no complaints of cough or shortness of breath, and leg edema is much better.  3. Diabetes has been controlled with Levemir and sliding  scale.  4. Hypertension has been controlled.  5. CKD is stable. The patient's creatinine increased to 1.76, but is in her baseline range. The patient needs a followup BMP with PCP as outpatient.   The patient has no complaints. Vital signs are stable. She is clinically stable and will be discharged to home with home health and physical therapy today. I discussed the patient's discharge plan with the patient, the patient's son, nurse and case Freight forwarder.   TIME SPENT: About 38 minutes.    ____________________________ Demetrios Loll, MD qc:JT D: 04/11/2014 11:46:57 ET T: 04/11/2014 12:09:47 ET JOB#: [id number]  cc: Demetrios Loll, MD, <Dictator> Demetrios Loll MD ELECTRONICALLY SIGNED 04/11/2014 16:28

## 2014-11-04 NOTE — H&P (Signed)
PATIENT NAME:  Veronica Anderson, Veronica Anderson MR#:  [id number] DATE OF BIRTH:  10-05-1945  DATE OF ADMISSION:  11/23/2013  REFERRING PHYSICIAN:  Dr. Francene Castle.   PRIMARY CARE PHYSICIAN:  Dr. Ancil Boozer at [facility].   PRIMARY CARDIOLOGIST:  Seen once by Dr. Clayborn Bigness in the past.   CHIEF COMPLAINT:  Shortness of breath, worsening lower extremity edema.   HISTORY OF PRESENT ILLNESS:  This is a 69 year old female with known history of chronic kidney disease, baseline creatinine around 2.5, obstructive sleep apnea on CPAP at home on as needed O2, the patient presents with complaints of worsening shortness of breath over the last 4 to 5 days, the patient reports her symptoms have been going on for weeks, but did get much worse over the last 4 to 5 days, as well she reports worsening lower extremity edema as well, she denies any chest pain, any hemoptysis, any chest tightness, any recent travel, any cough, any productive sputum, any fever, any chills at home, the patient had elevated BNP at 1700, the patient was diagnosed with diastolic CHF in the past, but since then never had a work-up done.  No repeat echocardiogram, was not on any diuresis, the patient's when she came she was saturating at 80% on room air by EMS where she was started on CPAP.  Currently reports she feels much better, hospitalist requested to admit the patient for further treatment, the patient's troponin came back slightly elevated at 0.07.  She denies any chest pain.  It appears to be chronically elevated as well.   PAST MEDICAL HISTORY: 1.  Diabetes.  2.  Hypertension.  3.  Hyperlipidemia.  4.  History of CVA with residual left-sided weakness since 1996.   PAST SURGICAL HISTORY:  Ankle surgery a number of years ago.   ALLERGIES:  No known drug allergies.   FAMILY HISTORY:  Positive for hypertension, congestive heart failure, diabetes and coronary artery disease.   SOCIAL HISTORY:  Lives at home with her son in [place].  The patient  currently nonsmoker, quit smoking 20 years ago.  No alcohol.  No illicit drug use.   HOME MEDICATIONS: 1.  Aspirin 81 mg daily. 2.  Catapres patch every seven days.  3.  Neurontin 300 three times a day.  4.  Lantus 50 units daily.  5.  NovoLog daily before meals subQ.  6.  Allopurinol 100 mg daily.  7.  Simvastatin 40 mg at bedtime.  8.  Metoprolol 100 mg oral 2 times a day.  9.  Hyzaar 25/100 mg oral daily.  10.  Triplex 135 mg oral daily.  11.  Simvastatin 40 mg oral daily.  12.  Amlodipine 15 mg oral daily.  13.  Advair Diskus 100/50 1 puff twice daily.  14.  Metoprolol tartrate 100 mg oral 2 times a day.  15.  Singulair 10 mg oral daily.  16.  Iron oral daily.  17.  Omeprazole 20 mg oral 2 capsules daily.  18.  Bumex 1 mg daily as needed.   REVIEW OF SYSTEMS:  CONSTITUTIONAL:  The patient denies any fever, chills, fatigue, weakness.  Reports weight gain, mainly fluid weight.  EYES:  Denies blurry vision, double vision, inflammation, glaucoma.  EARS, NOSE, THROAT:  Denies tinnitus, ear pain, hearing loss, epistaxis or discharge.  RESPIRATORY:  Denies any cough, wheezing, hemoptysis.  Reports shortness of breath.  CARDIOVASCULAR:  Denies any chest pain, arrhythmia, palpitations, syncope.  Reports edema.  GASTROINTESTINAL:  Denies nausea, vomiting, diarrhea, abdominal pain, hematemesis.  GENITOURINARY:  Denies dysuria, hematuria, renal colic.  ENDOCRINE:  Denies polyuria, polydipsia, heat or cold intolerance. HEMATOLOGY:  Denies anemia, easy bruising, bleeding diathesis.  INTEGUMENTARY:  Denies acne, rash or skin lesion.  MUSCULOSKELETAL:  Denies any cramps, swelling, arthritis.  NEUROLOGIC:  Denies any history of vertigo, tremors, dementia, headache.  Reports history of CVA with residual left-sided weakness.  PSYCHIATRIC:  Denies anxiety, insomnia, or depression.   PHYSICAL EXAMINATION: VITAL SIGNS:  Temperature 97.6, pulse 59, respiratory rate 36, blood pressure 203/90,  saturating 97% on oxygen, this is upon presentation.  Most recent vital signs:  Pulse 56, respiratory rate 20, blood pressure 155/78, saturating 99% on CPAP.  GENERAL:  Morbidly obese female who looks comfortable in bed, in no apparent distress.  HEENT:  Head atraumatic, normocephalic.  Pupils equal, reactive to light.  Pink conjunctivae.  Anicteric sclerae.  Moist oral mucosa.  NECK:  Supple.  No thyromegaly.  No JVD.  No carotid bruits.  CHEST:  The patient had fair air entry bilaterally.  Difficult to auscultate due to morbid obesity, but no significant wheezing could be heard.  CARDIOVASCULAR:  S1, S2 heard.  No rubs, murmurs, or gallops.  ABDOMEN:  Obese, soft, nontender, nondistended.  Bowel sounds present.  EXTREMITIES:  +3 edema bilaterally with weeping skin wounds, minimal.  Pedal and radial pulses felt bilaterally.  No clubbing or cyanosis.  PSYCHIATRIC:  Appropriate affect.  Awake, alert x 3.  Intact judgment and insight.  LYMPHATIC:  No cervical lymphadenopathy could be appreciated.  NEUROLOGIC:  Cranial nerves grossly intact.  Sensation symmetrical and intact to light touch.   PERTINENT LABORATORY DATA:  Glucose 197.  BNP 1773, BUN 19, creatinine 1.53, sodium 142, potassium 3.7, chloride 106, CO2 32.  Troponin 0.07.  White blood cells 10.4, hemoglobin 8.7, hematocrit 28.3, platelet 290.   IMAGING STUDIES:  Chest x-ray showing cardiomegaly without acute disease.   ASSESSMENT AND PLAN: 1.  Acute congestive heart failure, we will check the patient's echocardiogram, will be admitted to telemetry unit, we will start her on intravenous Lasix.  We will check daily weights and ins and outs, we will consult [facility] cardiology for further evaluation.  We will cycle her troponins.   2.  Chronic kidney disease, appears to be improving at her baseline and this is most likely due to volume overload.  We will monitor closely.  3.  Anemia panel.  This is most likely due to anemia and this is most  likely due to chronic kidney disease.  We will continue her with iron supplement.  4.  Hypertension:  Initially uncontrolled, currently much improved.  We will continue her on her home medication.  We will add as needed hydralazine.  5.  Obstructive sleep apnea.  Continue with CPAP.  6.  Diabetes mellitus.  Continue with Lantus, but at a lower dose and we will add insulin sliding scale as well.  7.  Hyperlipidemia.  Continue with statin and triplex.  8.  History of cerebrovascular accident in the past.  Continue with aspirin.  9.  Borderline troponins, the patient denies any chest pain, this is most likely related to her chronic kidney disease.  We will cycle cardiac enzymes and follow the trend.  10.  CODE STATUS:  FULL CODE.   Total time spent on admission and patient care 55 minutes.     ____________________________ Albertine Patricia, MD dse:ea D: 11/24/2013 01:08:31 ET T: 11/24/2013 01:50:20 ET JOB#: [id number]  cc: Albertine Patricia, MD, <Dictator> [place]  MD ELECTRONICALLY SIGNED 11/24/2013 23:36

## 2014-11-05 DIAGNOSIS — M545 Low back pain: Secondary | ICD-10-CM | POA: Diagnosis not present

## 2014-11-06 DIAGNOSIS — M545 Low back pain: Secondary | ICD-10-CM | POA: Diagnosis not present

## 2014-11-07 DIAGNOSIS — M545 Low back pain: Secondary | ICD-10-CM | POA: Diagnosis not present

## 2014-11-08 DIAGNOSIS — M545 Low back pain: Secondary | ICD-10-CM | POA: Diagnosis not present

## 2014-11-09 DIAGNOSIS — M545 Low back pain: Secondary | ICD-10-CM | POA: Diagnosis not present

## 2014-11-10 DIAGNOSIS — M545 Low back pain: Secondary | ICD-10-CM | POA: Diagnosis not present

## 2014-11-11 DIAGNOSIS — M545 Low back pain: Secondary | ICD-10-CM | POA: Diagnosis not present

## 2014-11-12 DIAGNOSIS — M545 Low back pain: Secondary | ICD-10-CM | POA: Diagnosis not present

## 2014-11-13 DIAGNOSIS — M545 Low back pain: Secondary | ICD-10-CM | POA: Diagnosis not present

## 2014-11-14 DIAGNOSIS — M545 Low back pain: Secondary | ICD-10-CM | POA: Diagnosis not present

## 2014-11-15 DIAGNOSIS — M545 Low back pain: Secondary | ICD-10-CM | POA: Diagnosis not present

## 2014-11-16 DIAGNOSIS — M545 Low back pain: Secondary | ICD-10-CM | POA: Diagnosis not present

## 2014-11-17 DIAGNOSIS — M545 Low back pain: Secondary | ICD-10-CM | POA: Diagnosis not present

## 2014-11-18 DIAGNOSIS — M545 Low back pain: Secondary | ICD-10-CM | POA: Diagnosis not present

## 2014-11-19 DIAGNOSIS — M545 Low back pain: Secondary | ICD-10-CM | POA: Diagnosis not present

## 2014-11-20 DIAGNOSIS — Z9981 Dependence on supplemental oxygen: Secondary | ICD-10-CM | POA: Diagnosis not present

## 2014-11-20 DIAGNOSIS — I509 Heart failure, unspecified: Secondary | ICD-10-CM | POA: Diagnosis not present

## 2014-11-20 DIAGNOSIS — M545 Low back pain: Secondary | ICD-10-CM | POA: Diagnosis not present

## 2014-11-20 DIAGNOSIS — G4733 Obstructive sleep apnea (adult) (pediatric): Secondary | ICD-10-CM | POA: Diagnosis not present

## 2014-11-20 DIAGNOSIS — I10 Essential (primary) hypertension: Secondary | ICD-10-CM | POA: Diagnosis not present

## 2014-11-20 DIAGNOSIS — E1140 Type 2 diabetes mellitus with diabetic neuropathy, unspecified: Secondary | ICD-10-CM | POA: Diagnosis not present

## 2014-11-21 DIAGNOSIS — M545 Low back pain: Secondary | ICD-10-CM | POA: Diagnosis not present

## 2014-11-22 DIAGNOSIS — M545 Low back pain: Secondary | ICD-10-CM | POA: Diagnosis not present

## 2014-11-23 DIAGNOSIS — M545 Low back pain: Secondary | ICD-10-CM | POA: Diagnosis not present

## 2014-11-24 DIAGNOSIS — M545 Low back pain: Secondary | ICD-10-CM | POA: Diagnosis not present

## 2014-11-25 DIAGNOSIS — M545 Low back pain: Secondary | ICD-10-CM | POA: Diagnosis not present

## 2014-11-26 DIAGNOSIS — M545 Low back pain: Secondary | ICD-10-CM | POA: Diagnosis not present

## 2014-11-27 DIAGNOSIS — M545 Low back pain: Secondary | ICD-10-CM | POA: Diagnosis not present

## 2014-11-28 DIAGNOSIS — M545 Low back pain: Secondary | ICD-10-CM | POA: Diagnosis not present

## 2014-11-29 DIAGNOSIS — M545 Low back pain: Secondary | ICD-10-CM | POA: Diagnosis not present

## 2014-11-30 DIAGNOSIS — M545 Low back pain: Secondary | ICD-10-CM | POA: Diagnosis not present

## 2014-12-01 DIAGNOSIS — M545 Low back pain: Secondary | ICD-10-CM | POA: Diagnosis not present

## 2014-12-02 DIAGNOSIS — M545 Low back pain: Secondary | ICD-10-CM | POA: Diagnosis not present

## 2014-12-03 DIAGNOSIS — M545 Low back pain: Secondary | ICD-10-CM | POA: Diagnosis not present

## 2014-12-04 DIAGNOSIS — M545 Low back pain: Secondary | ICD-10-CM | POA: Diagnosis not present

## 2014-12-05 DIAGNOSIS — M545 Low back pain: Secondary | ICD-10-CM | POA: Diagnosis not present

## 2014-12-06 DIAGNOSIS — M545 Low back pain: Secondary | ICD-10-CM | POA: Diagnosis not present

## 2014-12-07 DIAGNOSIS — M545 Low back pain: Secondary | ICD-10-CM | POA: Diagnosis not present

## 2014-12-08 DIAGNOSIS — M545 Low back pain: Secondary | ICD-10-CM | POA: Diagnosis not present

## 2014-12-09 DIAGNOSIS — M545 Low back pain: Secondary | ICD-10-CM | POA: Diagnosis not present

## 2014-12-10 DIAGNOSIS — M545 Low back pain: Secondary | ICD-10-CM | POA: Diagnosis not present

## 2014-12-11 DIAGNOSIS — M545 Low back pain: Secondary | ICD-10-CM | POA: Diagnosis not present

## 2014-12-13 DIAGNOSIS — M545 Low back pain: Secondary | ICD-10-CM | POA: Diagnosis not present

## 2014-12-14 DIAGNOSIS — M545 Low back pain: Secondary | ICD-10-CM | POA: Diagnosis not present

## 2014-12-15 DIAGNOSIS — M545 Low back pain: Secondary | ICD-10-CM | POA: Diagnosis not present

## 2014-12-16 DIAGNOSIS — M545 Low back pain: Secondary | ICD-10-CM | POA: Diagnosis not present

## 2014-12-17 DIAGNOSIS — M545 Low back pain: Secondary | ICD-10-CM | POA: Diagnosis not present

## 2014-12-18 DIAGNOSIS — M545 Low back pain: Secondary | ICD-10-CM | POA: Diagnosis not present

## 2014-12-19 DIAGNOSIS — M545 Low back pain: Secondary | ICD-10-CM | POA: Diagnosis not present

## 2014-12-20 DIAGNOSIS — M545 Low back pain: Secondary | ICD-10-CM | POA: Diagnosis not present

## 2014-12-21 DIAGNOSIS — M545 Low back pain: Secondary | ICD-10-CM | POA: Diagnosis not present

## 2014-12-22 DIAGNOSIS — M545 Low back pain: Secondary | ICD-10-CM | POA: Diagnosis not present

## 2014-12-23 DIAGNOSIS — M545 Low back pain: Secondary | ICD-10-CM | POA: Diagnosis not present

## 2014-12-24 DIAGNOSIS — M545 Low back pain: Secondary | ICD-10-CM | POA: Diagnosis not present

## 2014-12-25 DIAGNOSIS — M545 Low back pain: Secondary | ICD-10-CM | POA: Diagnosis not present

## 2014-12-26 DIAGNOSIS — M545 Low back pain: Secondary | ICD-10-CM | POA: Diagnosis not present

## 2014-12-27 DIAGNOSIS — M545 Low back pain: Secondary | ICD-10-CM | POA: Diagnosis not present

## 2014-12-28 DIAGNOSIS — M545 Low back pain: Secondary | ICD-10-CM | POA: Diagnosis not present

## 2014-12-29 DIAGNOSIS — M545 Low back pain: Secondary | ICD-10-CM | POA: Diagnosis not present

## 2014-12-30 DIAGNOSIS — M545 Low back pain: Secondary | ICD-10-CM | POA: Diagnosis not present

## 2014-12-31 DIAGNOSIS — M545 Low back pain: Secondary | ICD-10-CM | POA: Diagnosis not present

## 2015-01-01 DIAGNOSIS — M545 Low back pain: Secondary | ICD-10-CM | POA: Diagnosis not present

## 2015-01-02 DIAGNOSIS — M545 Low back pain: Secondary | ICD-10-CM | POA: Diagnosis not present

## 2015-01-03 DIAGNOSIS — M545 Low back pain: Secondary | ICD-10-CM | POA: Diagnosis not present

## 2015-01-04 DIAGNOSIS — M545 Low back pain: Secondary | ICD-10-CM | POA: Diagnosis not present

## 2015-01-05 DIAGNOSIS — M545 Low back pain: Secondary | ICD-10-CM | POA: Diagnosis not present

## 2015-01-06 DIAGNOSIS — M545 Low back pain: Secondary | ICD-10-CM | POA: Diagnosis not present

## 2015-01-07 DIAGNOSIS — M545 Low back pain: Secondary | ICD-10-CM | POA: Diagnosis not present

## 2015-01-08 DIAGNOSIS — M545 Low back pain: Secondary | ICD-10-CM | POA: Diagnosis not present

## 2015-01-09 DIAGNOSIS — M545 Low back pain: Secondary | ICD-10-CM | POA: Diagnosis not present

## 2015-01-10 ENCOUNTER — Other Ambulatory Visit: Payer: Self-pay | Admitting: Family Medicine

## 2015-01-10 DIAGNOSIS — M545 Low back pain: Secondary | ICD-10-CM | POA: Diagnosis not present

## 2015-01-10 NOTE — Telephone Encounter (Signed)
PT MOTHER CALLED SAYING THAT THERE DAUGHTER IS OUT OF HER BLOOD PRESSURE MEDS AND THE PHARM SAID WHEN SHE CALLED THAT YOU WOULD NEED TO SEND IN RX FOR IT. THE PATIENT IS OUT OF THEM.

## 2015-01-10 NOTE — Telephone Encounter (Signed)
Patient requesting refill. 

## 2015-01-10 NOTE — Telephone Encounter (Signed)
Patient requesting refill on catapres patch and a refill on blood pressure medication (she does not remember the name of it. States she takes it 3 times daily). Please return call.

## 2015-01-11 MED ORDER — CLONIDINE HCL 0.3 MG/24HR TD PTWK: 0.3000 mg | MEDICATED_PATCH | TRANSDERMAL | Status: DC

## 2015-01-11 MED ORDER — HYDRALAZINE HCL 50 MG PO TABS: 50.0000 mg | ORAL_TABLET | Freq: Three times a day (TID) | ORAL | Status: DC

## 2015-01-12 DIAGNOSIS — M545 Low back pain: Secondary | ICD-10-CM | POA: Diagnosis not present

## 2015-01-13 DIAGNOSIS — M545 Low back pain: Secondary | ICD-10-CM | POA: Diagnosis not present

## 2015-01-14 DIAGNOSIS — M545 Low back pain: Secondary | ICD-10-CM | POA: Diagnosis not present

## 2015-01-15 DIAGNOSIS — M545 Low back pain: Secondary | ICD-10-CM | POA: Diagnosis not present

## 2015-01-16 DIAGNOSIS — M545 Low back pain: Secondary | ICD-10-CM | POA: Diagnosis not present

## 2015-01-17 ENCOUNTER — Other Ambulatory Visit: Payer: Self-pay | Admitting: Family Medicine

## 2015-01-17 DIAGNOSIS — M545 Low back pain: Secondary | ICD-10-CM | POA: Diagnosis not present

## 2015-01-18 DIAGNOSIS — M545 Low back pain: Secondary | ICD-10-CM | POA: Diagnosis not present

## 2015-01-19 DIAGNOSIS — M545 Low back pain: Secondary | ICD-10-CM | POA: Diagnosis not present

## 2015-01-20 DIAGNOSIS — M545 Low back pain: Secondary | ICD-10-CM | POA: Diagnosis not present

## 2015-01-21 DIAGNOSIS — M545 Low back pain: Secondary | ICD-10-CM | POA: Diagnosis not present

## 2015-01-22 ENCOUNTER — Ambulatory Visit: Payer: Self-pay | Admitting: Family Medicine

## 2015-01-22 DIAGNOSIS — M545 Low back pain: Secondary | ICD-10-CM | POA: Diagnosis not present

## 2015-01-23 DIAGNOSIS — M545 Low back pain: Secondary | ICD-10-CM | POA: Diagnosis not present

## 2015-01-24 ENCOUNTER — Other Ambulatory Visit: Payer: Self-pay | Admitting: Family Medicine

## 2015-01-24 DIAGNOSIS — M545 Low back pain: Secondary | ICD-10-CM | POA: Diagnosis not present

## 2015-01-24 NOTE — Telephone Encounter (Signed)
Patient requesting refill. 

## 2015-01-25 DIAGNOSIS — M545 Low back pain: Secondary | ICD-10-CM | POA: Diagnosis not present

## 2015-01-26 DIAGNOSIS — M545 Low back pain: Secondary | ICD-10-CM | POA: Diagnosis not present

## 2015-01-27 DIAGNOSIS — M545 Low back pain: Secondary | ICD-10-CM | POA: Diagnosis not present

## 2015-01-28 DIAGNOSIS — M545 Low back pain: Secondary | ICD-10-CM | POA: Diagnosis not present

## 2015-01-29 DIAGNOSIS — M545 Low back pain: Secondary | ICD-10-CM | POA: Diagnosis not present

## 2015-01-30 DIAGNOSIS — M545 Low back pain: Secondary | ICD-10-CM | POA: Diagnosis not present

## 2015-01-31 DIAGNOSIS — M545 Low back pain: Secondary | ICD-10-CM | POA: Diagnosis not present

## 2015-02-01 DIAGNOSIS — M545 Low back pain: Secondary | ICD-10-CM | POA: Diagnosis not present

## 2015-02-02 DIAGNOSIS — M545 Low back pain: Secondary | ICD-10-CM | POA: Diagnosis not present

## 2015-02-03 DIAGNOSIS — M545 Low back pain: Secondary | ICD-10-CM | POA: Diagnosis not present

## 2015-02-04 DIAGNOSIS — M545 Low back pain: Secondary | ICD-10-CM | POA: Diagnosis not present

## 2015-02-05 DIAGNOSIS — M545 Low back pain: Secondary | ICD-10-CM | POA: Diagnosis not present

## 2015-02-06 DIAGNOSIS — M545 Low back pain: Secondary | ICD-10-CM | POA: Diagnosis not present

## 2015-02-07 DIAGNOSIS — M545 Low back pain: Secondary | ICD-10-CM | POA: Diagnosis not present

## 2015-02-08 DIAGNOSIS — M545 Low back pain: Secondary | ICD-10-CM | POA: Diagnosis not present

## 2015-02-09 DIAGNOSIS — M545 Low back pain: Secondary | ICD-10-CM | POA: Diagnosis not present

## 2015-02-10 DIAGNOSIS — M545 Low back pain: Secondary | ICD-10-CM | POA: Diagnosis not present

## 2015-02-12 DIAGNOSIS — M545 Low back pain: Secondary | ICD-10-CM | POA: Diagnosis not present

## 2015-02-13 DIAGNOSIS — M545 Low back pain: Secondary | ICD-10-CM | POA: Diagnosis not present

## 2015-02-14 DIAGNOSIS — M545 Low back pain: Secondary | ICD-10-CM | POA: Diagnosis not present

## 2015-02-15 DIAGNOSIS — M545 Low back pain: Secondary | ICD-10-CM | POA: Diagnosis not present

## 2015-02-16 DIAGNOSIS — M545 Low back pain: Secondary | ICD-10-CM | POA: Diagnosis not present

## 2015-02-17 DIAGNOSIS — M545 Low back pain: Secondary | ICD-10-CM | POA: Diagnosis not present

## 2015-02-18 DIAGNOSIS — M545 Low back pain: Secondary | ICD-10-CM | POA: Diagnosis not present

## 2015-02-19 DIAGNOSIS — M545 Low back pain: Secondary | ICD-10-CM | POA: Diagnosis not present

## 2015-02-20 DIAGNOSIS — M545 Low back pain: Secondary | ICD-10-CM | POA: Diagnosis not present

## 2015-02-21 DIAGNOSIS — M545 Low back pain: Secondary | ICD-10-CM | POA: Diagnosis not present

## 2015-02-22 DIAGNOSIS — M545 Low back pain: Secondary | ICD-10-CM | POA: Diagnosis not present

## 2015-02-23 ENCOUNTER — Encounter: Payer: Self-pay | Admitting: Family Medicine

## 2015-02-23 DIAGNOSIS — M545 Low back pain, unspecified: Secondary | ICD-10-CM | POA: Insufficient documentation

## 2015-02-23 DIAGNOSIS — I129 Hypertensive chronic kidney disease with stage 1 through stage 4 chronic kidney disease, or unspecified chronic kidney disease: Secondary | ICD-10-CM | POA: Insufficient documentation

## 2015-02-23 DIAGNOSIS — R7989 Other specified abnormal findings of blood chemistry: Secondary | ICD-10-CM | POA: Insufficient documentation

## 2015-02-23 DIAGNOSIS — G629 Polyneuropathy, unspecified: Secondary | ICD-10-CM | POA: Insufficient documentation

## 2015-02-23 DIAGNOSIS — E1140 Type 2 diabetes mellitus with diabetic neuropathy, unspecified: Secondary | ICD-10-CM | POA: Insufficient documentation

## 2015-02-23 DIAGNOSIS — K219 Gastro-esophageal reflux disease without esophagitis: Secondary | ICD-10-CM | POA: Insufficient documentation

## 2015-02-23 DIAGNOSIS — E559 Vitamin D deficiency, unspecified: Secondary | ICD-10-CM | POA: Insufficient documentation

## 2015-02-23 DIAGNOSIS — K5900 Constipation, unspecified: Secondary | ICD-10-CM | POA: Insufficient documentation

## 2015-02-23 DIAGNOSIS — G4733 Obstructive sleep apnea (adult) (pediatric): Secondary | ICD-10-CM | POA: Insufficient documentation

## 2015-02-23 DIAGNOSIS — D638 Anemia in other chronic diseases classified elsewhere: Secondary | ICD-10-CM | POA: Insufficient documentation

## 2015-02-23 DIAGNOSIS — Z8701 Personal history of pneumonia (recurrent): Secondary | ICD-10-CM | POA: Insufficient documentation

## 2015-02-23 DIAGNOSIS — Z9981 Dependence on supplemental oxygen: Secondary | ICD-10-CM | POA: Insufficient documentation

## 2015-02-23 DIAGNOSIS — I5030 Unspecified diastolic (congestive) heart failure: Secondary | ICD-10-CM | POA: Insufficient documentation

## 2015-02-23 DIAGNOSIS — M1990 Unspecified osteoarthritis, unspecified site: Secondary | ICD-10-CM | POA: Insufficient documentation

## 2015-02-23 DIAGNOSIS — I10 Essential (primary) hypertension: Secondary | ICD-10-CM | POA: Insufficient documentation

## 2015-02-23 DIAGNOSIS — E46 Unspecified protein-calorie malnutrition: Secondary | ICD-10-CM | POA: Insufficient documentation

## 2015-02-23 DIAGNOSIS — J3089 Other allergic rhinitis: Secondary | ICD-10-CM

## 2015-02-23 DIAGNOSIS — J4520 Mild intermittent asthma, uncomplicated: Secondary | ICD-10-CM | POA: Insufficient documentation

## 2015-02-23 DIAGNOSIS — E785 Hyperlipidemia, unspecified: Secondary | ICD-10-CM | POA: Insufficient documentation

## 2015-02-23 DIAGNOSIS — J302 Other seasonal allergic rhinitis: Secondary | ICD-10-CM | POA: Insufficient documentation

## 2015-02-23 DIAGNOSIS — N183 Chronic kidney disease, stage 3 (moderate): Secondary | ICD-10-CM

## 2015-02-24 DIAGNOSIS — M545 Low back pain: Secondary | ICD-10-CM | POA: Diagnosis not present

## 2015-02-25 DIAGNOSIS — M545 Low back pain: Secondary | ICD-10-CM | POA: Diagnosis not present

## 2015-02-26 ENCOUNTER — Encounter: Payer: Self-pay | Admitting: Family Medicine

## 2015-02-26 ENCOUNTER — Ambulatory Visit (INDEPENDENT_AMBULATORY_CARE_PROVIDER_SITE_OTHER): Payer: Commercial Managed Care - HMO | Admitting: Family Medicine

## 2015-02-26 VITALS — BP 144/72 | HR 64 | Temp 98.8°F | Resp 18 | Ht 62.0 in | Wt 302.9 lb

## 2015-02-26 DIAGNOSIS — N184 Chronic kidney disease, stage 4 (severe): Secondary | ICD-10-CM

## 2015-02-26 DIAGNOSIS — E1140 Type 2 diabetes mellitus with diabetic neuropathy, unspecified: Secondary | ICD-10-CM | POA: Diagnosis not present

## 2015-02-26 DIAGNOSIS — E1144 Type 2 diabetes mellitus with diabetic amyotrophy: Secondary | ICD-10-CM | POA: Diagnosis not present

## 2015-02-26 DIAGNOSIS — I5030 Unspecified diastolic (congestive) heart failure: Secondary | ICD-10-CM

## 2015-02-26 DIAGNOSIS — Z9981 Dependence on supplemental oxygen: Secondary | ICD-10-CM

## 2015-02-26 DIAGNOSIS — M545 Low back pain: Secondary | ICD-10-CM | POA: Diagnosis not present

## 2015-02-26 DIAGNOSIS — G4733 Obstructive sleep apnea (adult) (pediatric): Secondary | ICD-10-CM

## 2015-02-26 DIAGNOSIS — Z23 Encounter for immunization: Secondary | ICD-10-CM | POA: Diagnosis not present

## 2015-02-26 DIAGNOSIS — D649 Anemia, unspecified: Secondary | ICD-10-CM | POA: Diagnosis not present

## 2015-02-26 DIAGNOSIS — I10 Essential (primary) hypertension: Secondary | ICD-10-CM

## 2015-02-26 DIAGNOSIS — E46 Unspecified protein-calorie malnutrition: Secondary | ICD-10-CM

## 2015-02-26 LAB — POCT UA - MICROALBUMIN: MICROALBUMIN (UR) POC: 20 mg/L

## 2015-02-26 LAB — POCT GLYCOSYLATED HEMOGLOBIN (HGB A1C): HEMOGLOBIN A1C: 5.7

## 2015-02-26 NOTE — Progress Notes (Signed)
Name: Veronica Anderson   MRN: [id number]    DOB: 04/25/1946   Date:02/26/2015       Progress Note  Subjective  Chief Complaint  Chief Complaint  Patient presents with  . Diabetes    Checks BG bid low-52, high-221  . Hypertension  . Hyperlipidemia  . Gastrophageal Reflux  . Allergic Rhinitis     HPI  DMII: her appetite has improved and she has gained weight, she is following a diabetic diet and glucose has been at goal, sometimes it drops to 52 when fasting, usually 70-80 fasting, occasionally up to 120.  She has been using Humalog pre meal to 5 units before meals. She is due for eye exam  HTN: she has been taking medication orally, but was out of Catapress for one week, just replaced it before coming in to our office  Hyperlipidemia: taking medication and denies side effects  GERD: doing well at this time, nausea resolved and is gaining weight again  CKI: she was seen by nephrologist, and was advised to continue follow up, urine micro negative today  OSA: uses CPAP every night, all night and is having supplemental oxygen through the CPAP machine  CHF: diastolic, she still has orthopnea, uses 3 pillows at night, using CPAP, no leg swelling, states at this time no SOB with mild activity like she used to  Patient Active Problem List   Diagnosis Date Noted  . Anemia in chronic illness 02/23/2015  . Asthma, mild intermittent 02/23/2015  . Benign essential HTN 02/23/2015  . Diastolic heart failure, NYHA class 3 02/23/2015  . Chronic kidney disease (CKD), stage IV (severe) 02/23/2015  . CN (constipation) 02/23/2015  . Diabetes mellitus with neuropathy 02/23/2015  . Dyslipidemia 02/23/2015  . Elevated ferritin 02/23/2015  . Gastro-esophageal reflux disease without esophagitis 02/23/2015  . LBP (low back pain) 02/23/2015  . Extreme obesity 02/23/2015  . Obstructive apnea 02/23/2015  . Osteoarthritis 02/23/2015  . Neuropathy 02/23/2015  . Perennial allergic rhinitis with seasonal  variation 02/23/2015  . History of pneumonia 02/23/2015  . Dependence on supplemental oxygen 02/23/2015  . Vitamin D deficiency 02/23/2015  . Cataract 04/16/2010  . Background diabetic retinopathy 12/18/2009    Past Surgical History  Procedure Laterality Date  . Ankle surgery Left     History reviewed. No pertinent family history.  Social History   Social History  . Marital Status: Single    Spouse Name: N/A  . Number of Children: N/A  . Years of Education: N/A   Occupational History  . Not on file.   Social History Main Topics  . Smoking status: Former Smoker    Types: Cigarettes    Quit date: 07/14/1994  . Smokeless tobacco: Not on file  . Alcohol Use: No  . Drug Use: No  . Sexual Activity: Not Currently   Other Topics Concern  . Not on file   Social History Narrative     Current outpatient prescriptions:  .  ACCU-CHEK AVIVA PLUS test strip, CHECK BLOOD SUGAR TWICE DAILY, Disp: 100 each, Rfl: 3 .  amLODipine (NORVASC) 10 MG tablet, Take 10 mg by mouth daily., Disp: , Rfl: 5 .  aspirin 81 MG chewable tablet, Chew 1 tablet by mouth daily., Disp: , Rfl:  .  cloNIDine (CATAPRES-TTS-3) 0.3 mg/24hr patch, Place 1 patch (0.3 mg total) onto the skin once a week., Disp: 4 patch, Rfl: 12 .  furosemide (LASIX) 20 MG tablet, Take 20 mg by mouth 2 (two) times daily., Disp: ,  Rfl: 5 .  gabapentin (NEURONTIN) 300 MG capsule, Take 300 mg by mouth 3 (three) times daily., Disp: , Rfl: 6 .  HUMALOG KWIKPEN 100 UNIT/ML KiwkPen, INJECT 5 TO 10 UNITS SUBCUTANEOUSLY BEFORE MEALS, Disp: , Rfl: 2 .  hydrALAZINE (APRESOLINE) 50 MG tablet, Take 1 tablet (50 mg total) by mouth 3 (three) times daily., Disp: 90 tablet, Rfl: 5 .  LANTUS SOLOSTAR 100 UNIT/ML Solostar Pen, INJECT 50 UNITS SUBCUTANEOUSLY DAILY, Disp: 5 pen, Rfl: 2 .  loratadine (CLARITIN) 10 MG tablet, TAKE 1 TABLET BY MOUTH EVERY DAY FOR ALLERGIES, Disp: , Rfl: 5 .  metoprolol (LOPRESSOR) 100 MG tablet, Take 100 mg by mouth 2  (two) times daily., Disp: , Rfl: 5 .  montelukast (SINGULAIR) 10 MG tablet, Take 10 mg by mouth daily., Disp: , Rfl: 5 .  NOVOFINE 32G X 6 MM MISC, USE TO INJECT INSULIN 4 TIMES A DAY, Disp: , Rfl: 5 .  omeprazole (PRILOSEC) 40 MG capsule, Take 40 mg by mouth every morning., Disp: , Rfl: 5 .  pravastatin (PRAVACHOL) 40 MG tablet, Take 1 tablet by mouth daily., Disp: , Rfl: 5 .  Vitamin D, Ergocalciferol, (DRISDOL) 50000 UNITS CAPS capsule, Take 50,000 Units by mouth once a week., Disp: , Rfl: 4  Allergies  Allergen Reactions  . Ace Inhibitors     cough     ROS  Constitutional: Negative for fever positive for weight change - gain  Respiratory: Negative for cough and shortness of breath - using nocturnal oxygen .   Cardiovascular: Negative for chest pain or palpitations.  Gastrointestinal: Negative for abdominal pain, no bowel changes.  Musculoskeletal: positive  for gait problem no  joint swelling.  Skin: Negative for rash.  Neurological: Negative for dizziness or headache.  No other specific complaints in a complete review of systems (except as listed in HPI above).  Objective  Filed Vitals:   02/26/15 1120  BP: 152/78  Pulse: 64  Temp: 98.8 F (37.1 C)  TempSrc: Oral  Resp: 18  Height: 5\' 2"  (1.575 m)  Weight: 302 lb 14.4 oz (137.395 kg)  SpO2: 91%    Body mass index is 55.39 kg/(m^2).  Physical Exam  Constitutional: Patient appears well-developed and well-nourished. Obese No distress.  HEENT: head atraumatic, normocephalic, pupils equal and reactive to light, neck supple, throat within normal limits Cardiovascular: Normal rate, regular rhythm and normal heart sounds.  No murmur heard. No BLE edema. Pulmonary/Chest: Effort normal and breath sounds normal. No respiratory distress. Abdominal: Soft.  There is no tenderness. Psychiatric: Patient has a normal mood and affect. behavior is normal. Judgment and thought content normal.  Recent Results (from the past 2160  hour(s))  POCT HgB A1C     Status: Normal   Collection Time: 02/26/15 11:33 AM  Result Value Ref Range   Hemoglobin A1C 5.7   POCT UA - Microalbumin     Status: Abnormal   Collection Time: 02/26/15 11:33 AM  Result Value Ref Range   Microalbumin Ur, POC 20 mg/L   Creatinine, POC  mg/dL   Albumin/Creatinine Ratio, Urine, POC        PHQ2/9: Depression screen [facility] 2/9 02/26/2015  Decreased Interest 0  Down, Depressed, Hopeless 0  PHQ - 2 Score 0     Fall Risk: Fall Risk  02/26/2015  Falls in the past year? No     Assessment & Plan  1. Type 2 diabetes mellitus with diabetic neuropathy Glucose is low in am's, decrease lantus to  40 units and also may decrease pre meal insulin to 3 units if the meal will be small.  - Ambulatory referral to Ophthalmology  2. 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 Obstructive apnea Continue CPAP machine  4. Chronic kidney disease (CKD), stage IV (severe) Recheck labs -vitamin D -HCT -comp panel  5. Dependence on supplemental oxygen stable  6. Diastolic heart failure, NYHA class 3 stable  7. Protein calorie malnutrition Eating again, doing better, mother is cooking for her, no longer an issue  8. Needs flu shot  - Flu Vaccine QUAD 36+ mos IM  9. Essential hypertension  - Comprehensive metabolic panel

## 2015-02-27 DIAGNOSIS — M545 Low back pain: Secondary | ICD-10-CM | POA: Diagnosis not present

## 2015-02-27 LAB — COMPREHENSIVE METABOLIC PANEL
ALBUMIN: 3.7 g/dL (ref 3.6–4.8)
ALK PHOS: 99 IU/L (ref 39–117)
ALT: 8 IU/L (ref 0–32)
AST: 12 IU/L (ref 0–40)
Albumin/Globulin Ratio: 1 — ABNORMAL LOW (ref 1.1–2.5)
BILIRUBIN TOTAL: 0.6 mg/dL (ref 0.0–1.2)
BUN / CREAT RATIO: 21 (ref 11–26)
BUN: 36 mg/dL — AB (ref 8–27)
CHLORIDE: 99 mmol/L (ref 97–108)
CO2: 29 mmol/L (ref 18–29)
CREATININE: 1.74 mg/dL — AB (ref 0.57–1.00)
Calcium: 9.3 mg/dL (ref 8.7–10.3)
GFR calc Af Amer: 34 mL/min/{1.73_m2} — ABNORMAL LOW (ref >59)
GFR calc non Af Amer: 30 mL/min/{1.73_m2} — ABNORMAL LOW (ref >59)
GLUCOSE: 168 mg/dL — AB (ref 65–99)
Globulin, Total: 3.8 g/dL (ref 1.5–4.5)
Potassium: 4.1 mmol/L (ref 3.5–5.2)
Sodium: 145 mmol/L — ABNORMAL HIGH (ref 134–144)
Total Protein: 7.5 g/dL (ref 6.0–8.5)

## 2015-02-27 LAB — VITAMIN D 25 HYDROXY (VIT D DEFICIENCY, FRACTURES): VIT D 25 HYDROXY: 50.7 ng/mL (ref 30.0–100.0)

## 2015-02-27 LAB — HEMATOCRIT: HEMATOCRIT: 29.3 % — AB (ref 34.0–46.6)

## 2015-02-28 ENCOUNTER — Other Ambulatory Visit: Payer: Self-pay | Admitting: Family Medicine

## 2015-02-28 DIAGNOSIS — M545 Low back pain: Secondary | ICD-10-CM | POA: Diagnosis not present

## 2015-02-28 DIAGNOSIS — D649 Anemia, unspecified: Secondary | ICD-10-CM

## 2015-03-01 DIAGNOSIS — M545 Low back pain: Secondary | ICD-10-CM | POA: Diagnosis not present

## 2015-03-01 NOTE — Progress Notes (Signed)
PT notified ferrtin added

## 2015-03-02 DIAGNOSIS — M545 Low back pain: Secondary | ICD-10-CM | POA: Diagnosis not present

## 2015-03-02 LAB — SPECIMEN STATUS REPORT

## 2015-03-02 LAB — FERRITIN: Ferritin: 34 ng/mL (ref 15–150)

## 2015-03-02 NOTE — Progress Notes (Signed)
Patient notified

## 2015-03-03 DIAGNOSIS — M545 Low back pain: Secondary | ICD-10-CM | POA: Diagnosis not present

## 2015-03-04 DIAGNOSIS — M545 Low back pain: Secondary | ICD-10-CM | POA: Diagnosis not present

## 2015-03-05 DIAGNOSIS — M545 Low back pain: Secondary | ICD-10-CM | POA: Diagnosis not present

## 2015-03-06 DIAGNOSIS — M545 Low back pain: Secondary | ICD-10-CM | POA: Diagnosis not present

## 2015-03-07 DIAGNOSIS — M545 Low back pain: Secondary | ICD-10-CM | POA: Diagnosis not present

## 2015-03-08 DIAGNOSIS — M545 Low back pain: Secondary | ICD-10-CM | POA: Diagnosis not present

## 2015-03-09 DIAGNOSIS — M545 Low back pain: Secondary | ICD-10-CM | POA: Diagnosis not present

## 2015-03-10 DIAGNOSIS — M545 Low back pain: Secondary | ICD-10-CM | POA: Diagnosis not present

## 2015-03-11 DIAGNOSIS — M545 Low back pain: Secondary | ICD-10-CM | POA: Diagnosis not present

## 2015-03-12 DIAGNOSIS — M545 Low back pain: Secondary | ICD-10-CM | POA: Diagnosis not present

## 2015-03-13 DIAGNOSIS — M545 Low back pain: Secondary | ICD-10-CM | POA: Diagnosis not present

## 2015-03-15 DIAGNOSIS — M545 Low back pain: Secondary | ICD-10-CM | POA: Diagnosis not present

## 2015-03-16 DIAGNOSIS — M545 Low back pain: Secondary | ICD-10-CM | POA: Diagnosis not present

## 2015-03-17 DIAGNOSIS — M545 Low back pain: Secondary | ICD-10-CM | POA: Diagnosis not present

## 2015-03-18 DIAGNOSIS — M545 Low back pain: Secondary | ICD-10-CM | POA: Diagnosis not present

## 2015-03-19 DIAGNOSIS — M545 Low back pain: Secondary | ICD-10-CM | POA: Diagnosis not present

## 2015-03-20 DIAGNOSIS — M545 Low back pain: Secondary | ICD-10-CM | POA: Diagnosis not present

## 2015-03-21 DIAGNOSIS — M545 Low back pain: Secondary | ICD-10-CM | POA: Diagnosis not present

## 2015-03-22 DIAGNOSIS — M545 Low back pain: Secondary | ICD-10-CM | POA: Diagnosis not present

## 2015-03-23 DIAGNOSIS — M545 Low back pain: Secondary | ICD-10-CM | POA: Diagnosis not present

## 2015-03-24 DIAGNOSIS — M545 Low back pain: Secondary | ICD-10-CM | POA: Diagnosis not present

## 2015-03-25 DIAGNOSIS — M545 Low back pain: Secondary | ICD-10-CM | POA: Diagnosis not present

## 2015-03-26 DIAGNOSIS — M545 Low back pain: Secondary | ICD-10-CM | POA: Diagnosis not present

## 2015-03-27 ENCOUNTER — Inpatient Hospital Stay
Admission: EM | Admit: 2015-03-27 | Discharge: 2015-03-31 | DRG: 871 | Disposition: A | Discharge status: Home Health | Payer: Medicare Other | Attending: Internal Medicine | Admitting: Internal Medicine

## 2015-03-27 ENCOUNTER — Emergency Department: Payer: Medicare Other

## 2015-03-27 DIAGNOSIS — Z87891 Personal history of nicotine dependence: Secondary | ICD-10-CM | POA: Diagnosis not present

## 2015-03-27 DIAGNOSIS — J9622 Acute and chronic respiratory failure with hypercapnia: Secondary | ICD-10-CM | POA: Diagnosis present

## 2015-03-27 DIAGNOSIS — E785 Hyperlipidemia, unspecified: Secondary | ICD-10-CM | POA: Diagnosis not present

## 2015-03-27 DIAGNOSIS — J9600 Acute respiratory failure, unspecified whether with hypoxia or hypercapnia: Secondary | ICD-10-CM | POA: Diagnosis present

## 2015-03-27 DIAGNOSIS — I129 Hypertensive chronic kidney disease with stage 1 through stage 4 chronic kidney disease, or unspecified chronic kidney disease: Secondary | ICD-10-CM | POA: Diagnosis present

## 2015-03-27 DIAGNOSIS — Z794 Long term (current) use of insulin: Secondary | ICD-10-CM

## 2015-03-27 DIAGNOSIS — J9601 Acute respiratory failure with hypoxia: Secondary | ICD-10-CM | POA: Diagnosis not present

## 2015-03-27 DIAGNOSIS — Z9981 Dependence on supplemental oxygen: Secondary | ICD-10-CM | POA: Diagnosis not present

## 2015-03-27 DIAGNOSIS — R0600 Dyspnea, unspecified: Secondary | ICD-10-CM | POA: Diagnosis not present

## 2015-03-27 DIAGNOSIS — Z7982 Long term (current) use of aspirin: Secondary | ICD-10-CM | POA: Diagnosis not present

## 2015-03-27 DIAGNOSIS — N184 Chronic kidney disease, stage 4 (severe): Secondary | ICD-10-CM | POA: Diagnosis present

## 2015-03-27 DIAGNOSIS — M545 Low back pain: Secondary | ICD-10-CM | POA: Diagnosis not present

## 2015-03-27 DIAGNOSIS — D631 Anemia in chronic kidney disease: Secondary | ICD-10-CM | POA: Diagnosis present

## 2015-03-27 DIAGNOSIS — R531 Weakness: Secondary | ICD-10-CM | POA: Diagnosis not present

## 2015-03-27 DIAGNOSIS — J189 Pneumonia, unspecified organism: Secondary | ICD-10-CM | POA: Diagnosis present

## 2015-03-27 DIAGNOSIS — F17210 Nicotine dependence, cigarettes, uncomplicated: Secondary | ICD-10-CM | POA: Diagnosis not present

## 2015-03-27 DIAGNOSIS — E118 Type 2 diabetes mellitus with unspecified complications: Secondary | ICD-10-CM | POA: Diagnosis not present

## 2015-03-27 DIAGNOSIS — R799 Abnormal finding of blood chemistry, unspecified: Secondary | ICD-10-CM | POA: Diagnosis not present

## 2015-03-27 DIAGNOSIS — J9621 Acute and chronic respiratory failure with hypoxia: Secondary | ICD-10-CM | POA: Diagnosis present

## 2015-03-27 DIAGNOSIS — Z888 Allergy status to other drugs, medicaments and biological substances status: Secondary | ICD-10-CM | POA: Diagnosis not present

## 2015-03-27 DIAGNOSIS — Z79899 Other long term (current) drug therapy: Secondary | ICD-10-CM | POA: Diagnosis not present

## 2015-03-27 DIAGNOSIS — K219 Gastro-esophageal reflux disease without esophagitis: Secondary | ICD-10-CM | POA: Diagnosis present

## 2015-03-27 DIAGNOSIS — R918 Other nonspecific abnormal finding of lung field: Secondary | ICD-10-CM | POA: Diagnosis not present

## 2015-03-27 DIAGNOSIS — I5033 Acute on chronic diastolic (congestive) heart failure: Secondary | ICD-10-CM | POA: Diagnosis present

## 2015-03-27 DIAGNOSIS — J9690 Respiratory failure, unspecified, unspecified whether with hypoxia or hypercapnia: Secondary | ICD-10-CM | POA: Diagnosis not present

## 2015-03-27 DIAGNOSIS — Z6841 Body Mass Index (BMI) 40.0 and over, adult: Secondary | ICD-10-CM | POA: Diagnosis not present

## 2015-03-27 DIAGNOSIS — G4733 Obstructive sleep apnea (adult) (pediatric): Secondary | ICD-10-CM | POA: Diagnosis present

## 2015-03-27 DIAGNOSIS — G4730 Sleep apnea, unspecified: Secondary | ICD-10-CM | POA: Diagnosis not present

## 2015-03-27 DIAGNOSIS — E1122 Type 2 diabetes mellitus with diabetic chronic kidney disease: Secondary | ICD-10-CM | POA: Diagnosis present

## 2015-03-27 DIAGNOSIS — R0602 Shortness of breath: Secondary | ICD-10-CM | POA: Diagnosis not present

## 2015-03-27 DIAGNOSIS — R7881 Bacteremia: Secondary | ICD-10-CM | POA: Diagnosis not present

## 2015-03-27 DIAGNOSIS — N179 Acute kidney failure, unspecified: Secondary | ICD-10-CM | POA: Diagnosis not present

## 2015-03-27 DIAGNOSIS — E119 Type 2 diabetes mellitus without complications: Secondary | ICD-10-CM | POA: Diagnosis not present

## 2015-03-27 DIAGNOSIS — A419 Sepsis, unspecified organism: Principal | ICD-10-CM | POA: Diagnosis present

## 2015-03-27 DIAGNOSIS — I10 Essential (primary) hypertension: Secondary | ICD-10-CM | POA: Diagnosis not present

## 2015-03-27 LAB — LACTIC ACID, PLASMA: Lactic Acid, Venous: 2.7 mmol/L (ref 0.5–2.0)

## 2015-03-27 LAB — CBC WITH DIFFERENTIAL/PLATELET
BASOS ABS: 0 10*3/uL (ref 0–0.1)
Basophils Relative: 0 %
EOS ABS: 0 10*3/uL (ref 0–0.7)
EOS PCT: 0 %
HCT: 26.9 % — ABNORMAL LOW (ref 35.0–47.0)
Hemoglobin: 8.3 g/dL — ABNORMAL LOW (ref 12.0–16.0)
LYMPHS PCT: 3 %
Lymphs Abs: 0.5 10*3/uL — ABNORMAL LOW (ref 1.0–3.6)
MCH: 18.7 pg — ABNORMAL LOW (ref 26.0–34.0)
MCHC: 30.9 g/dL — ABNORMAL LOW (ref 32.0–36.0)
MCV: 60.5 fL — AB (ref 80.0–100.0)
Monocytes Absolute: 0.5 10*3/uL (ref 0.2–0.9)
Monocytes Relative: 3 %
NEUTROS PCT: 94 %
Neutro Abs: 16.5 10*3/uL — ABNORMAL HIGH (ref 1.4–6.5)
PLATELETS: 204 10*3/uL (ref 150–440)
RBC: 4.45 MIL/uL (ref 3.80–5.20)
RDW: 22.9 % — ABNORMAL HIGH (ref 11.5–14.5)
WBC: 17.5 10*3/uL — AB (ref 3.6–11.0)

## 2015-03-27 LAB — TROPONIN I: TROPONIN I: 0.06 ng/mL — AB (ref <0.031)

## 2015-03-27 LAB — BASIC METABOLIC PANEL
ANION GAP: 13 (ref 5–15)
BUN: 48 mg/dL — ABNORMAL HIGH (ref 6–20)
CO2: 26 mmol/L (ref 22–32)
Calcium: 8.7 mg/dL — ABNORMAL LOW (ref 8.9–10.3)
Chloride: 101 mmol/L (ref 101–111)
Creatinine, Ser: 2.1 mg/dL — ABNORMAL HIGH (ref 0.44–1.00)
GFR calc Af Amer: 27 mL/min — ABNORMAL LOW (ref >60)
GFR, EST NON AFRICAN AMERICAN: 23 mL/min — AB (ref >60)
Glucose, Bld: 210 mg/dL — ABNORMAL HIGH (ref 65–99)
POTASSIUM: 4.7 mmol/L (ref 3.5–5.1)
SODIUM: 140 mmol/L (ref 135–145)

## 2015-03-27 LAB — BLOOD GAS, ARTERIAL
ACID-BASE EXCESS: 2.7 mmol/L (ref 0.0–3.0)
ALLENS TEST (PASS/FAIL): POSITIVE — AB
BICARBONATE: 28.9 meq/L — AB (ref 21.0–28.0)
Delivery systems: POSITIVE
Expiratory PAP: 8
FIO2: 0.55
Inspiratory PAP: 15
MECHANICAL RATE: 10
O2 Saturation: 95.8 %
PATIENT TEMPERATURE: 37
PH ART: 7.37 (ref 7.350–7.450)
pCO2 arterial: 50 mmHg — ABNORMAL HIGH (ref 32.0–48.0)
pO2, Arterial: 83 mmHg (ref 83.0–108.0)

## 2015-03-27 LAB — GLUCOSE, CAPILLARY: GLUCOSE-CAPILLARY: 265 mg/dL — AB (ref 65–99)

## 2015-03-27 LAB — BRAIN NATRIURETIC PEPTIDE: B NATRIURETIC PEPTIDE 5: 316 pg/mL — AB (ref 0.0–100.0)

## 2015-03-27 MED ORDER — AMLODIPINE BESYLATE 10 MG PO TABS
10.0000 mg | ORAL_TABLET | Freq: Every day | ORAL | Status: DC
  Administered 2015-03-27 – 2015-03-31 (×5): 10 mg via ORAL
  Filled 2015-03-27: qty 2
  Filled 2015-03-27 (×4): qty 1

## 2015-03-27 MED ORDER — METOPROLOL TARTRATE 100 MG PO TABS
100.0000 mg | ORAL_TABLET | Freq: Two times a day (BID) | ORAL | Status: DC
  Administered 2015-03-27: 100 mg via ORAL
  Filled 2015-03-27: qty 1

## 2015-03-27 MED ORDER — ENOXAPARIN SODIUM 40 MG/0.4ML ~~LOC~~ SOLN
40.0000 mg | Freq: Two times a day (BID) | SUBCUTANEOUS | Status: DC
  Administered 2015-03-27 – 2015-03-31 (×8): 40 mg via SUBCUTANEOUS
  Filled 2015-03-27 (×8): qty 0.4

## 2015-03-27 MED ORDER — PRAVASTATIN SODIUM 20 MG PO TABS
40.0000 mg | ORAL_TABLET | Freq: Every day | ORAL | Status: DC
  Administered 2015-03-28 – 2015-03-30 (×3): 40 mg via ORAL
  Filled 2015-03-27: qty 2
  Filled 2015-03-27: qty 1
  Filled 2015-03-27: qty 2
  Filled 2015-03-27 (×3): qty 1

## 2015-03-27 MED ORDER — IPRATROPIUM-ALBUTEROL 0.5-2.5 (3) MG/3ML IN SOLN: 3.0000 mL | Freq: Once | RESPIRATORY_TRACT | Status: DC

## 2015-03-27 MED ORDER — VITAMIN D (ERGOCALCIFEROL) 1.25 MG (50000 UNIT) PO CAPS
50000.0000 [IU] | ORAL_CAPSULE | ORAL | Status: DC
  Filled 2015-03-27: qty 1

## 2015-03-27 MED ORDER — SODIUM CHLORIDE 0.9 % IV SOLN
INTRAVENOUS | Status: DC
  Administered 2015-03-27: 21:00:00 via INTRAVENOUS

## 2015-03-27 MED ORDER — PANTOPRAZOLE SODIUM 40 MG PO TBEC
40.0000 mg | DELAYED_RELEASE_TABLET | Freq: Every day | ORAL | Status: DC
  Administered 2015-03-27 – 2015-03-31 (×5): 40 mg via ORAL
  Filled 2015-03-27 (×5): qty 1

## 2015-03-27 MED ORDER — HYDRALAZINE HCL 50 MG PO TABS
50.0000 mg | ORAL_TABLET | Freq: Three times a day (TID) | ORAL | Status: DC
  Administered 2015-03-27 – 2015-03-29 (×5): 50 mg via ORAL
  Filled 2015-03-27 (×5): qty 1

## 2015-03-27 MED ORDER — FUROSEMIDE 40 MG PO TABS: 20.0000 mg | ORAL_TABLET | Freq: Two times a day (BID) | ORAL | Status: DC

## 2015-03-27 MED ORDER — CLONIDINE HCL 0.3 MG/24HR TD PTWK
0.3000 mg | MEDICATED_PATCH | TRANSDERMAL | Status: DC
  Administered 2015-03-27: 0.3 mg via TRANSDERMAL
  Filled 2015-03-27: qty 3

## 2015-03-27 MED ORDER — INSULIN GLARGINE 100 UNIT/ML ~~LOC~~ SOLN
42.0000 [IU] | Freq: Every day | SUBCUTANEOUS | Status: DC
  Administered 2015-03-27: 42 [IU] via SUBCUTANEOUS
  Filled 2015-03-27: qty 0.42

## 2015-03-27 MED ORDER — IPRATROPIUM-ALBUTEROL 0.5-2.5 (3) MG/3ML IN SOLN
RESPIRATORY_TRACT | Status: AC
  Filled 2015-03-27: qty 3

## 2015-03-27 MED ORDER — ASPIRIN EC 81 MG PO TBEC
81.0000 mg | DELAYED_RELEASE_TABLET | Freq: Every day | ORAL | Status: DC
  Administered 2015-03-27 – 2015-03-30 (×4): 81 mg via ORAL
  Filled 2015-03-27 (×4): qty 1

## 2015-03-27 MED ORDER — GABAPENTIN 300 MG PO CAPS
300.0000 mg | ORAL_CAPSULE | Freq: Three times a day (TID) | ORAL | Status: DC
  Administered 2015-03-27 – 2015-03-31 (×11): 300 mg via ORAL
  Filled 2015-03-27 (×11): qty 1

## 2015-03-27 MED ORDER — LEVOFLOXACIN IN D5W 750 MG/150ML IV SOLN
750.0000 mg | Freq: Once | INTRAVENOUS | Status: AC
  Administered 2015-03-27: 750 mg via INTRAVENOUS
  Filled 2015-03-27: qty 150

## 2015-03-27 MED ORDER — INSULIN ASPART 100 UNIT/ML ~~LOC~~ SOLN
2.0000 [IU] | SUBCUTANEOUS | Status: DC
  Filled 2015-03-27 (×3): qty 6

## 2015-03-27 MED ORDER — METHYLPREDNISOLONE SODIUM SUCC 125 MG IJ SOLR
125.0000 mg | Freq: Once | INTRAMUSCULAR | Status: AC
Start: 2015-03-27 — End: 2015-03-27
  Administered 2015-03-27: 125 mg via INTRAVENOUS
  Filled 2015-03-27: qty 2

## 2015-03-27 MED ORDER — IPRATROPIUM BROMIDE 0.02 % IN SOLN
0.5000 mg | Freq: Four times a day (QID) | RESPIRATORY_TRACT | Status: DC
  Administered 2015-03-27 – 2015-03-28 (×3): 0.5 mg via RESPIRATORY_TRACT
  Filled 2015-03-27 (×3): qty 2.5

## 2015-03-27 MED ORDER — ACETAMINOPHEN 325 MG PO TABS
650.0000 mg | ORAL_TABLET | Freq: Four times a day (QID) | ORAL | Status: DC | PRN
  Filled 2015-03-27: qty 2

## 2015-03-27 MED ORDER — ACETAMINOPHEN 650 MG RE SUPP
650.0000 mg | Freq: Four times a day (QID) | RECTAL | Status: DC | PRN
Start: 2015-03-27 — End: 2015-03-31

## 2015-03-27 MED ORDER — LEVOFLOXACIN IN D5W 500 MG/100ML IV SOLN
500.0000 mg | INTRAVENOUS | Status: DC
  Administered 2015-03-27: 500 mg via INTRAVENOUS
  Filled 2015-03-27: qty 100

## 2015-03-27 MED ORDER — MONTELUKAST SODIUM 10 MG PO TABS
10.0000 mg | ORAL_TABLET | Freq: Every day | ORAL | Status: DC
  Administered 2015-03-27: 10 mg via ORAL
  Filled 2015-03-27: qty 1

## 2015-03-27 MED ORDER — ONDANSETRON HCL 4 MG PO TABS: 4.0000 mg | ORAL_TABLET | Freq: Four times a day (QID) | ORAL | Status: DC | PRN

## 2015-03-27 MED ORDER — ALBUTEROL SULFATE (2.5 MG/3ML) 0.083% IN NEBU
2.5000 mg | INHALATION_SOLUTION | Freq: Four times a day (QID) | RESPIRATORY_TRACT | Status: DC
  Administered 2015-03-27 – 2015-03-28 (×3): 2.5 mg via RESPIRATORY_TRACT
  Filled 2015-03-27 (×3): qty 3

## 2015-03-27 MED ORDER — NYSTATIN 100000 UNIT/GM EX POWD
Freq: Three times a day (TID) | CUTANEOUS | Status: DC
  Administered 2015-03-28 – 2015-03-31 (×10): via TOPICAL
  Filled 2015-03-27 (×2): qty 15

## 2015-03-27 MED ORDER — ONDANSETRON HCL 4 MG/2ML IJ SOLN: 4.0000 mg | Freq: Four times a day (QID) | INTRAMUSCULAR | Status: DC | PRN

## 2015-03-27 MED ORDER — IPRATROPIUM-ALBUTEROL 0.5-2.5 (3) MG/3ML IN SOLN
RESPIRATORY_TRACT | Status: AC
Start: 2015-03-27 — End: 2015-03-27
  Administered 2015-03-27: 3 mL
  Filled 2015-03-27: qty 6

## 2015-03-27 MED ORDER — LORATADINE 10 MG PO TABS: 10.0000 mg | ORAL_TABLET | Freq: Every day | ORAL | Status: DC | PRN

## 2015-03-27 NOTE — ED Notes (Signed)
Pt has history of CHF, DM. Called EMS. Pt was 64% on 2L [place]. 138/62, CBG 189. NSR. EMS placed patient on CPAP. O2 sat went up to 83%.

## 2015-03-27 NOTE — ED Provider Notes (Signed)
**Note Veronica-Identified via Obfuscation** [facility] Emergency Department Provider Note    ____________________________________________  Time seen: On EMS arrival  I have reviewed the triage vital signs and the nursing notes.   HISTORY  Chief Complaint Shortness of Breath   History limited by: respiratory distress   HPI Veronica Anderson is a 69 y.o. female with history of CHF who presents to the emergency department today via EMS because of concerns for shortness of breath. She states that her shortness of breath started roughly 6 hours ago. She states that it started fairly acutely. It has been getting worse throughout the day. She has not had any associated chest pain. Not coughing up any phlegm. Patient had initial O2 sats in the 72s for EMS. They did put her on BiPAP. Pressure for EMS was systolic of the [id number] diastolic in the 0000000. Patient denied any fevers.   Past Medical History  Diagnosis Date  . GERD (gastroesophageal reflux disease)   . Left ankle pain   . Requires supplemental oxygen   . Obesity   . Vitamin D deficiency   . Chronic kidney disease     stage IV (severe), Dr. Aleene Davidson  . Diabetes mellitus without complication   . Anemia   . Hyperlipidemia   . Hypertension   . CHF (NYHA class II, ACC/AHA stage C)   . Allergy   . Constipation   . Lumbago   . Asthma   . Protein calorie malnutrition   . OA (osteoarthritis)   . Obstructive sleep apnea syndrome   . Paresthesia     Foot  . Cataract     bilateral    Patient Active Problem List   Diagnosis Date Noted  . Anemia in chronic illness 02/23/2015  . Asthma, mild intermittent 02/23/2015  . Benign essential HTN 02/23/2015  . Diastolic heart failure, NYHA class 3 02/23/2015  . Chronic kidney disease (CKD), stage IV (severe) 02/23/2015  . CN (constipation) 02/23/2015  . Diabetes mellitus with neuropathy 02/23/2015  . Dyslipidemia 02/23/2015  . Elevated ferritin 02/23/2015  . Gastro-esophageal reflux disease without esophagitis  02/23/2015  . LBP (low back pain) 02/23/2015  . Extreme obesity 02/23/2015  . Obstructive apnea 02/23/2015  . Osteoarthritis 02/23/2015  . Neuropathy 02/23/2015  . Perennial allergic rhinitis with seasonal variation 02/23/2015  . History of pneumonia 02/23/2015  . Dependence on supplemental oxygen 02/23/2015  . Vitamin D deficiency 02/23/2015  . Cataract 04/16/2010  . Background diabetic retinopathy 12/18/2009    Past Surgical History  Procedure Laterality Date  . Ankle surgery Left     Current Outpatient Rx  Name  Route  Sig  Dispense  Refill  . ACCU-CHEK AVIVA PLUS test strip      CHECK BLOOD SUGAR TWICE DAILY   100 each   3     DX Code Needed  . E11.29   . amLODipine (NORVASC) 10 MG tablet   Oral   Take 10 mg by mouth daily.      5   . aspirin 81 MG chewable tablet   Oral   Chew 1 tablet by mouth daily.         . cloNIDine (CATAPRES-TTS-3) 0.3 mg/24hr patch   Transdermal   Place 1 patch (0.3 mg total) onto the skin once a week.   4 patch   12   . furosemide (LASIX) 20 MG tablet   Oral   Take 20 mg by mouth 2 (two) times daily.      5   .  gabapentin (NEURONTIN) 300 MG capsule   Oral   Take 300 mg by mouth 3 (three) times daily.      6   . HUMALOG KWIKPEN 100 UNIT/ML KiwkPen      INJECT 5 TO 10 UNITS SUBCUTANEOUSLY BEFORE MEALS      2     Dispense as written.   . hydrALAZINE (APRESOLINE) 50 MG tablet   Oral   Take 1 tablet (50 mg total) by mouth 3 (three) times daily.   90 tablet   5   . LANTUS SOLOSTAR 100 UNIT/ML Solostar Pen      INJECT 50 UNITS SUBCUTANEOUSLY DAILY   5 pen   2   . loratadine (CLARITIN) 10 MG tablet      TAKE 1 TABLET BY MOUTH EVERY DAY FOR ALLERGIES      5   . metoprolol (LOPRESSOR) 100 MG tablet   Oral   Take 100 mg by mouth 2 (two) times daily.      5   . montelukast (SINGULAIR) 10 MG tablet   Oral   Take 10 mg by mouth daily.      5   . NOVOFINE 32G X 6 MM MISC      USE TO INJECT INSULIN 4  TIMES A DAY      5     Dispense as written.   Marland Kitchen omeprazole (PRILOSEC) 40 MG capsule   Oral   Take 40 mg by mouth every morning.      5   . pravastatin (PRAVACHOL) 40 MG tablet   Oral   Take 1 tablet by mouth daily.      5   . Vitamin D, Ergocalciferol, (DRISDOL) 50000 UNITS CAPS capsule   Oral   Take 50,000 Units by mouth once a week.      4     Allergies Ace inhibitors  History reviewed. No pertinent family history.  Social History Social History  Substance Use Topics  . Smoking status: Former Smoker    Types: Cigarettes    Quit date: 07/14/1994  . Smokeless tobacco: None  . Alcohol Use: No    Review of Systems  Constitutional: Negative for fever. Cardiovascular: Negative for chest pain. Respiratory: Positive for shortness of breath. Gastrointestinal: Negative for abdominal pain, vomiting and diarrhea. Genitourinary: Negative for dysuria. Musculoskeletal: Negative for back pain. Skin: Negative for rash. Neurological: Negative for headaches, focal weakness or numbness.  10-point ROS otherwise negative.  ____________________________________________   PHYSICAL EXAM:  VITAL SIGNS: ED Triage Vitals  Enc Vitals Group     BP 03/27/15 1658 138/62 mmHg     Pulse Rate 03/27/15 1658 66     Resp --      Temp --      Temp src --      SpO2 03/27/15 1658 83 %     Weight 03/27/15 1658 302 lb (136.986 kg)     Height 03/27/15 1658 5\' 4"  (1.626 m)   Constitutional: Alert and oriented. Moderate respiratory distress, on CPAP Eyes: Conjunctivae are normal. PERRL. Normal extraocular movements. ENT   Head: Normocephalic and atraumatic.   Nose: No congestion/rhinnorhea.   Mouth/Throat: Mucous membranes are moist.   Neck: No stridor. Hematological/Lymphatic/Immunilogical: No cervical lymphadenopathy. Cardiovascular: Normal rate, regular rhythm.  No murmurs, rubs, or gallops. Respiratory: Increased respiratory effort. Moderate respiratory distress.  Diffuse crackles, some wheeze Gastrointestinal: Soft and nontender. No distention.  Genitourinary: Deferred Musculoskeletal: Normal range of motion in all extremities. No joint effusions.  No  lower extremity tenderness nor edema. Neurologic:  Normal speech and language. No gross focal neurologic deficits are appreciated. Speech is normal.  Skin:  Skin is warm, dry and intact. No rash noted. Psychiatric: Mood and affect are normal. Speech and behavior are normal. Patient exhibits appropriate insight and judgment.  ____________________________________________    LABS (pertinent positives/negatives)  Labs Reviewed  TROPONIN I - Abnormal; Notable for the following:    Troponin I 0.06 (*)    All other components within normal limits  CBC WITH DIFFERENTIAL/PLATELET - Abnormal; Notable for the following:    WBC 17.5 (*)    Hemoglobin 8.3 (*)    HCT 26.9 (*)    MCV 60.5 (*)    MCH 18.7 (*)    MCHC 30.9 (*)    RDW 22.9 (*)    Neutro Abs 16.5 (*)    Lymphs Abs 0.5 (*)    All other components within normal limits  BASIC METABOLIC PANEL - Abnormal; Notable for the following:    Glucose, Bld 210 (*)    BUN 48 (*)    Creatinine, Ser 2.10 (*)    Calcium 8.7 (*)    GFR calc non Af Amer 23 (*)    GFR calc Af Amer 27 (*)    All other components within normal limits  BRAIN NATRIURETIC PEPTIDE - Abnormal; Notable for the following:    B Natriuretic Peptide 316.0 (*)    All other components within normal limits  GLUCOSE, CAPILLARY - Abnormal; Notable for the following:    Glucose-Capillary 265 (*)    All other components within normal limits  BLOOD GAS, ARTERIAL - Abnormal; Notable for the following:    pCO2 arterial 50 (*)    Bicarbonate 28.9 (*)    Allens test (pass/fail) POSITIVE (*)    All other components within normal limits  CULTURE, BLOOD (ROUTINE X 2)  CULTURE, BLOOD (ROUTINE X 2)  BASIC METABOLIC PANEL  CBC  LACTIC ACID, PLASMA      ____________________________________________   EKG  I, Nance Pear, attending physician, personally viewed and interpreted this EKG  EKG Time: 1728 Rate: 69 Rhythm: NSR Axis: left axis deviation Intervals: qtc 465 QRS: narrow, LVH ST changes: no st elevation Impression: borderline ECG   ____________________________________________    RADIOLOGY  CXR IMPRESSION: Radiodensity projecting over the right upper hemi thorax is nonspecific. Recommend correlation for being external to the patient.  Cardiomegaly. New diffuse bilateral airspace opacities may represent edema or multi focal infection.  I, Karstyn Birkey, personally viewed and evaluated these images (plain radiographs) as part of my medical decision making.    ____________________________________________   PROCEDURES  Procedure(s) performed: None  Critical Care performed: Yes, see critical care note(s)  CRITICAL CARE Performed by: Nance Pear   Total critical care time: 35  Critical care time was exclusive of separately billable procedures and treating other patients.  Critical care was necessary to treat or prevent imminent or life-threatening deterioration.  Critical care was time spent personally by me on the following activities: development of treatment plan with patient and/or surrogate as well as nursing, discussions with consultants, evaluation of patient's response to treatment, examination of patient, obtaining history from patient or surrogate, ordering and performing treatments and interventions, ordering and review of laboratory studies, ordering and review of radiographic studies, pulse oximetry and re-evaluation of patient's condition.  ____________________________________________   INITIAL IMPRESSION / ASSESSMENT AND PLAN / ED COURSE  Pertinent labs & imaging results that were available during my care  of the patient were reviewed by me and considered in my medical  decision making (see chart for details).  Patient presents to the emergency department today via EMS for respiratory distress - initial O2 sats in the 60s. EMS did place patient on CPAP. Upon arrival here patient in moderate respiratory distress. Patient was placed on BiPAP. Patient does have a history of CHF however blood pressure whilst elevated was not as high as typically seen for flash pulmonary edema. Chest x-ray was obtained which was concerning for possible infiltrates. Additionally patient did have leukocytosis. Additionally patient's troponin and creatinine were elevated. I did have concern for sepsis with multiorgan system failure. Wrote for broad spectrum antibiotics. Will admit patient.   ____________________________________________   FINAL CLINICAL IMPRESSION(S) / ED DIAGNOSES  Final diagnoses:  Community acquired pneumonia  Shortness of breath  Respiratory Distress   Nance Pear, MD 03/27/15 2318

## 2015-03-27 NOTE — H&P (Signed)
[facility] [place] NAME: Veronica Anderson    MR#:  [id number]  DATE OF BIRTH:  12-21-1945  DATE OF ADMISSION:  03/27/2015  PRIMARY CARE PHYSICIAN: Loistine Chance, MD   REQUESTING/REFERRING PHYSICIAN: Nikki Dom  CHIEF COMPLAINT:   Chief Complaint  Patient presents with  . Shortness of Breath    HISTORY OF PRESENT ILLNESS: Veronica Anderson  is a 69 y.o. female with a known history of  morbid obesity CHF chronic kidney disease, hypertension, hyperlipidemia who has not been feeling well for the past 1 week. With shortness of breath. She is chronically on oxygen 2 L at all times and CPAP at nighttime. She started having cough and shortness of breath which is progressively gotten worse. When she arrived to the ED she was having difficulty with breathing and had to be placed on BiPAP. She denies any chest pains or palpitations no nausea vomiting or diarrhea she had some wheezing earlier today.  PAST MEDICAL HISTORY:   Past Medical History  Diagnosis Date  . GERD (gastroesophageal reflux disease)   . Left ankle pain   . Requires supplemental oxygen   . Obesity   . Vitamin D deficiency   . Chronic kidney disease     stage IV (severe), Dr. Aleene Davidson  . Diabetes mellitus without complication   . Anemia   . Hyperlipidemia   . Hypertension   . CHF (NYHA class II, ACC/AHA stage C)   . Allergy   . Constipation   . Lumbago   . Asthma   . Protein calorie malnutrition   . OA (osteoarthritis)   . Obstructive sleep apnea syndrome   . Paresthesia     Foot  . Cataract     bilateral    PAST SURGICAL HISTORY:  Past Surgical History  Procedure Laterality Date  . Ankle surgery Left     SOCIAL HISTORY:  Social History  Substance Use Topics  . Smoking status: Former Smoker    Types: Cigarettes    Quit date: 07/14/1994  . Smokeless tobacco: Not on file  . Alcohol Use: No    FAMILY HISTORY:  Family History  Problem Relation Age of Onset  .  Diabetes type II      DRUG ALLERGIES:  Allergies  Allergen Reactions  . Ace Inhibitors Cough    REVIEW OF SYSTEMS:   CONSTITUTIONAL: No fever, fatigue or weakness.  EYES: No blurred or double vision.  EARS, NOSE, AND THROAT: No tinnitus or ear pain.  RESPIRATORY:Positive  cough,  positive shortness of breath,  positive wheezing or hemoptysis.  CARDIOVASCULAR: No chest pain, orthopnea, edema.  GASTROINTESTINAL: No nausea, vomiting, diarrhea or abdominal pain.  GENITOURINARY: No dysuria, hematuria.  ENDOCRINE: No polyuria, nocturia,  HEMATOLOGY: No anemia, easy bruising or bleeding SKIN: No rash or lesion. MUSCULOSKELETAL: No joint pain or arthritis.   NEUROLOGIC: No tingling, numbness, weakness.  PSYCHIATRY: No anxiety or depression.   MEDICATIONS AT HOME:  Prior to Admission medications   Medication Sig Start Date End Date Taking? Authorizing Provider  amLODipine (NORVASC) 10 MG tablet Take 10 mg by mouth daily.   Yes Historical Provider, MD  aspirin EC 81 MG tablet Take 81 mg by mouth at bedtime.   Yes Historical Provider, MD  cloNIDine (CATAPRES-TTS-3) 0.3 mg/24hr patch Place 1 patch (0.3 mg total) onto the skin once a week. Patient taking differently: Place 0.3 mg onto the skin once a week. Pt applies patch on Sunday. 01/11/15  Yes Steele Sizer, MD  furosemide (LASIX) 20 MG tablet Take 20 mg by mouth 2 (two) times daily.   Yes Historical Provider, MD  gabapentin (NEURONTIN) 300 MG capsule Take 300 mg by mouth 3 (three) times daily.   Yes Historical Provider, MD  hydrALAZINE (APRESOLINE) 50 MG tablet Take 1 tablet (50 mg total) by mouth 3 (three) times daily. 01/11/15  Yes Steele Sizer, MD  insulin glargine (LANTUS) 100 UNIT/ML injection Inject 42 Units into the skin at bedtime.   Yes Historical Provider, MD  insulin lispro (HUMALOG) 100 UNIT/ML injection Inject 5 Units into the skin 3 (three) times daily with meals.   Yes Historical Provider, MD  loratadine (CLARITIN) 10 MG  tablet Take 10 mg by mouth daily as needed for allergies.   Yes Historical Provider, MD  metoprolol (LOPRESSOR) 100 MG tablet Take 100 mg by mouth 2 (two) times daily.   Yes Historical Provider, MD  montelukast (SINGULAIR) 10 MG tablet Take 10 mg by mouth at bedtime.   Yes Historical Provider, MD  omeprazole (PRILOSEC) 40 MG capsule Take 40 mg by mouth daily.   Yes Historical Provider, MD  pravastatin (PRAVACHOL) 40 MG tablet Take 40 mg by mouth at bedtime.   Yes Historical Provider, MD  Vitamin D, Ergocalciferol, (DRISDOL) 50000 UNITS CAPS capsule Take 50,000 Units by mouth every 7 (seven) days. Pt takes on Sunday.   Yes Historical Provider, MD      PHYSICAL EXAMINATION:   VITAL SIGNS: Blood pressure 161/52, pulse 69, resp. rate 16, height 5\' 4"  (1.626 m), weight 136.986 kg (302 lb), SpO2 98 %.  GENERAL:  69 y.o.-year-old patient lying in the bed  Critically ill on BiPAP  EYES: Pupils equal, round, reactive to light and accommodation. No scleral icterus. Extraocular muscles intact.  HEENT: Head atraumatic, normocephalic. Oropharynx and nasopharynx clear.  NECK:  Supple, no jugular venous distention. No thyroid enlargement, no tenderness.  LUNGS: Bilateral wheezing and rhonchi throughout both lungs  CARDIOVASCULAR: S1, S2 normal. No murmurs, rubs, or gallops.  ABDOMEN: Soft, nontender, nondistended. Bowel sounds present. No organomegaly or mass.  EXTREMITIES: No pedal edema, cyanosis, or clubbing.  NEUROLOGIC: Cranial nerves II through XII are intact. Muscle strength 5/5 in all extremities. Sensation intact. Gait not checked.  PSYCHIATRIC: The patient is alert and oriented x 3.  SKIN: No obvious rash, lesion, or ulcer.   LABORATORY PANEL:   CBC  Recent Labs Lab 03/27/15 1710  WBC 17.5*  HGB 8.3*  HCT 26.9*  PLT 204  MCV 60.5*  MCH 18.7*  MCHC 30.9*  RDW 22.9*  LYMPHSABS 0.5*  MONOABS 0.5  EOSABS 0.0  BASOSABS 0.0    ------------------------------------------------------------------------------------------------------------------  Chemistries   Recent Labs Lab 03/27/15 1710  NA 140  K 4.7  CL 101  CO2 26  GLUCOSE 210*  BUN 48*  CREATININE 2.10*  CALCIUM 8.7*   ------------------------------------------------------------------------------------------------------------------ estimated creatinine clearance is 35.5 mL/min (by C-G formula based on Cr of 2.1). ------------------------------------------------------------------------------------------------------------------ No results for input(s): TSH, T4TOTAL, T3FREE, THYROIDAB in the last 72 hours.  Invalid input(s): FREET3   Coagulation profile No results for input(s): INR, PROTIME in the last 168 hours. ------------------------------------------------------------------------------------------------------------------- No results for input(s): DDIMER in the last 72 hours. -------------------------------------------------------------------------------------------------------------------  Cardiac Enzymes  Recent Labs Lab 03/27/15 1710  TROPONINI 0.06*   ------------------------------------------------------------------------------------------------------------------ Invalid input(s): POCBNP  ---------------------------------------------------------------------------------------------------------------  Urinalysis No results found for: COLORURINE, APPEARANCEUR, LABSPEC, PHURINE, GLUCOSEU, HGBUR, BILIRUBINUR, KETONESUR, PROTEINUR, UROBILINOGEN, NITRITE, LEUKOCYTESUR   RADIOLOGY: Dg Chest Portable 1 View  03/27/2015   CLINICAL DATA:  Patient with shortness of breath.  EXAM: PORTABLE CHEST - 1 VIEW  COMPARISON:  Chest radiograph 04/08/2014  FINDINGS: Monitoring leads overlie the patient. Patient is rotated. Cardiomegaly. Interval development of diffuse bilateral heterogeneous pulmonary opacities with pulmonary vascular redistribution.  Possible small bilateral pleural effusions. Radiodensity projecting over right upper hemi thorax.  IMPRESSION: Radiodensity projecting over the right upper hemi thorax is nonspecific. Recommend correlation for being external to the patient.  Cardiomegaly. New diffuse bilateral airspace opacities may represent edema or multi focal infection.   Electronically Signed   By: Lovey Newcomer M.D.   On: 03/27/2015 17:57    EKG: Orders placed or performed during the hospital encounter of 03/27/15  . ED EKG  . ED EKG    IMPRESSION AND PLAN: Patient is a 69 year old African-American female with acute respiratory failure  1. Acute respiratory failure due to bilateral pneumonia: IV Levaquin, continue BiPAP, stepdown unit pulmonary consult, due to wheezing out place her on steroids and and nebulizers  2. Diabetes type 2: Lantus, sliding scale  3. Hypertension continue Norvasc and clonidine  4. Acute renal failure on chronic renal failure hold Lasix low-dose IV fluids  5. Sleep apnea patient currently on the BiPAP changed to CPAP once off BiPAP  6. Miscellaneous heparin for dvt proph    All the records are reviewed and case discussed with ED provider. Management plans discussed with the patient, family and they are in agreement.  CODE STATUS:    Code Status Orders        Start     Ordered   03/27/15 1939  Full code   Continuous     03/27/15 1941       TOTAL TIME TAKING CARE OF THIS PATIENT: 62minutes. Critical care time   Dustin Flock M.D on 03/27/2015 at 7:49 PM  Between 7am to 6pm - Pager - [phone]  After 6pm go to [email or web address] - password [email or web address] [facility]  Office  [phone]  CC: Primary care physician; Loistine Chance, MD

## 2015-03-27 NOTE — Progress Notes (Signed)
[facility] Progress Note Patient Name: Veronica Anderson DOB: 01-04-1946 MRN: [id number]   Date of Service  03/27/2015  HPI/Events of Note  Pt seen   And has bilateral PNA . No temp obtained.  No lactic acid. Pt on approp ABX and bipap. Hx of DM on steroids  eICU Interventions  ABG , lactic acid ordered. SSI ordered  I will ask PCCM to see this patient in AM. elink coverage tonite unless worsens     Intervention Category NEW PATIENT EVALUATION  Major Interventions: Acid-Base disturbance - evaluation and management;Respiratory failure - evaluation and management;Sepsis - evaluation and management;Infection - evaluation and management  Asencion Noble 03/27/2015, 10:23 PM

## 2015-03-27 NOTE — ED Notes (Signed)
Called RT to look at med orders for svn treatments

## 2015-03-27 NOTE — Progress Notes (Signed)
CRITICAL VALUE ALERT  Critical value received:  Lactic acid 2.7  Date of notification: 03/27/15  Time of notification:  2333  Critical value read back:Yes.    Nurse who received alert:  Tess RN  MD notified (1st page):  Dr Deterding  Time of first page: 2334  MD notified (2nd page):  Time of second page:  Responding MD:  Detering  Time MD responded:  2335

## 2015-03-27 NOTE — Progress Notes (Signed)
[facility] Progress Note Patient Name: Veronica Anderson DOB: [birth year] MRN: [id number]   Date of Service  03/27/2015  HPI/Events of Note  Lactate elevated at 2.7 but HD stable and actually hypertensive.  ABG 7.37/50/83/28.9 on BiPAP.  eICU Interventions  Plan: Recheck lactate with AM labs Continue with current fluids/BiPAP Continue to monitor patient     Intervention Category Intermediate Interventions: Diagnostic test evaluation  Tiesha Marich 03/27/2015, 11:34 PM

## 2015-03-27 NOTE — Progress Notes (Signed)
ANTICOAGULATION CONSULT NOTE - Initial Consult  Pharmacy Consult for Lovenox Indication: VTE prophylaxis  Allergies  Allergen Reactions  . Ace Inhibitors Cough    Patient Measurements: Height: 5\' 4"  (162.6 cm) Weight: (!) 302 lb (136.986 kg) IBW/kg (Calculated) : 54.7 Heparin Dosing Weight:   Vital Signs: BP: 172/64 mmHg (09/13 2112) Pulse Rate: 71 (09/13 2100)  Labs:  Recent Labs  03/27/15 1710  HGB 8.3*  HCT 26.9*  PLT 204  CREATININE 2.10*  TROPONINI 0.06*    Estimated Creatinine Clearance: 35.5 mL/min (by C-G formula based on Cr of 2.1).   Medical History: Past Medical History  Diagnosis Date  . GERD (gastroesophageal reflux disease)   . Left ankle pain   . Requires supplemental oxygen   . Obesity   . Vitamin D deficiency   . Chronic kidney disease     stage IV (severe), Dr. Aleene Davidson  . Diabetes mellitus without complication   . Anemia   . Hyperlipidemia   . Hypertension   . CHF (NYHA class II, ACC/AHA stage C)   . Allergy   . Constipation   . Lumbago   . Asthma   . Protein calorie malnutrition   . OA (osteoarthritis)   . Obstructive sleep apnea syndrome   . Paresthesia     Foot  . Cataract     bilateral    Medications:  Prescriptions prior to admission  Medication Sig Dispense Refill Last Dose  . amLODipine (NORVASC) 10 MG tablet Take 10 mg by mouth daily.   03/27/2015 at Unknown time  . aspirin EC 81 MG tablet Take 81 mg by mouth at bedtime.   03/26/2015 at 2000  . cloNIDine (CATAPRES-TTS-3) 0.3 mg/24hr patch Place 1 patch (0.3 mg total) onto the skin once a week. (Patient taking differently: Place 0.3 mg onto the skin once a week. Pt applies patch on Sunday.) 4 patch 12 03/25/2015 at unknown  . furosemide (LASIX) 20 MG tablet Take 20 mg by mouth 2 (two) times daily.   03/27/2015 at Unknown time  . gabapentin (NEURONTIN) 300 MG capsule Take 300 mg by mouth 3 (three) times daily.   03/27/2015 at Unknown time  . hydrALAZINE (APRESOLINE) 50 MG tablet  Take 1 tablet (50 mg total) by mouth 3 (three) times daily. 90 tablet 5 03/27/2015 at Unknown time  . insulin glargine (LANTUS) 100 UNIT/ML injection Inject 42 Units into the skin at bedtime.   03/26/2015 at Unknown time  . insulin lispro (HUMALOG) 100 UNIT/ML injection Inject 5 Units into the skin 3 (three) times daily with meals.   03/27/2015 at Unknown time  . loratadine (CLARITIN) 10 MG tablet Take 10 mg by mouth daily as needed for allergies.   Past Week at Unknown time  . metoprolol (LOPRESSOR) 100 MG tablet Take 100 mg by mouth 2 (two) times daily.   03/27/2015 at 0700  . montelukast (SINGULAIR) 10 MG tablet Take 10 mg by mouth at bedtime.   03/26/2015 at Unknown time  . omeprazole (PRILOSEC) 40 MG capsule Take 40 mg by mouth daily.   03/27/2015 at Unknown time  . pravastatin (PRAVACHOL) 40 MG tablet Take 40 mg by mouth at bedtime.   03/26/2015 at Unknown time  . Vitamin D, Ergocalciferol, (DRISDOL) 50000 UNITS CAPS capsule Take 50,000 Units by mouth every 7 (seven) days. Pt takes on Sunday.   03/25/2015 at unknown    Assessment: CrCl = 35.5 ml/min BMI = 51.9   Goal of Therapy:  dvt prophylaxis  Plan:  Lovenox 40 mg SQ Q24H originally ordered.  Will adjust dose to lovenox 40 mg SQ Q12H based on BMI > 40.   Chayson Charters D 03/27/2015,9:58 PM

## 2015-03-28 ENCOUNTER — Inpatient Hospital Stay
Admit: 2015-03-28 | Discharge: 2015-03-28 | Disposition: A | Discharge status: Home / Self Care | Payer: Medicare Other | Attending: Internal Medicine | Admitting: Internal Medicine

## 2015-03-28 DIAGNOSIS — R918 Other nonspecific abnormal finding of lung field: Secondary | ICD-10-CM

## 2015-03-28 DIAGNOSIS — N179 Acute kidney failure, unspecified: Secondary | ICD-10-CM

## 2015-03-28 DIAGNOSIS — E118 Type 2 diabetes mellitus with unspecified complications: Secondary | ICD-10-CM

## 2015-03-28 DIAGNOSIS — J9621 Acute and chronic respiratory failure with hypoxia: Secondary | ICD-10-CM

## 2015-03-28 LAB — CBC
HCT: 25.2 % — ABNORMAL LOW (ref 35.0–47.0)
HEMOGLOBIN: 7.5 g/dL — AB (ref 12.0–16.0)
MCH: 18.4 pg — AB (ref 26.0–34.0)
MCHC: 29.9 g/dL — ABNORMAL LOW (ref 32.0–36.0)
MCV: 61.6 fL — AB (ref 80.0–100.0)
Platelets: 184 10*3/uL (ref 150–440)
RBC: 4.09 MIL/uL (ref 3.80–5.20)
RDW: 22.9 % — ABNORMAL HIGH (ref 11.5–14.5)
WBC: 10.3 10*3/uL (ref 3.6–11.0)

## 2015-03-28 LAB — BASIC METABOLIC PANEL
ANION GAP: 8 (ref 5–15)
BUN: 47 mg/dL — ABNORMAL HIGH (ref 6–20)
CHLORIDE: 102 mmol/L (ref 101–111)
CO2: 29 mmol/L (ref 22–32)
Calcium: 8.5 mg/dL — ABNORMAL LOW (ref 8.9–10.3)
Creatinine, Ser: 2.04 mg/dL — ABNORMAL HIGH (ref 0.44–1.00)
GFR calc non Af Amer: 24 mL/min — ABNORMAL LOW (ref >60)
GFR, EST AFRICAN AMERICAN: 27 mL/min — AB (ref >60)
Glucose, Bld: 265 mg/dL — ABNORMAL HIGH (ref 65–99)
POTASSIUM: 4.1 mmol/L (ref 3.5–5.1)
Sodium: 139 mmol/L (ref 135–145)

## 2015-03-28 LAB — GLUCOSE, CAPILLARY
GLUCOSE-CAPILLARY: 140 mg/dL — AB (ref 65–99)
GLUCOSE-CAPILLARY: 200 mg/dL — AB (ref 65–99)
GLUCOSE-CAPILLARY: 243 mg/dL — AB (ref 65–99)
GLUCOSE-CAPILLARY: 259 mg/dL — AB (ref 65–99)
GLUCOSE-CAPILLARY: 303 mg/dL — AB (ref 65–99)
GLUCOSE-CAPILLARY: 305 mg/dL — AB (ref 65–99)
GLUCOSE-CAPILLARY: 340 mg/dL — AB (ref 65–99)
Glucose-Capillary: 298 mg/dL — ABNORMAL HIGH (ref 65–99)
Glucose-Capillary: 167 mg/dL — ABNORMAL HIGH (ref 65–99)
Glucose-Capillary: 119 mg/dL — ABNORMAL HIGH (ref 65–99)
Glucose-Capillary: 92 mg/dL (ref 65–99)
Glucose-Capillary: 107 mg/dL — ABNORMAL HIGH (ref 65–99)
Glucose-Capillary: 254 mg/dL — ABNORMAL HIGH (ref 65–99)

## 2015-03-28 LAB — MRSA PCR SCREENING: MRSA BY PCR: NEGATIVE

## 2015-03-28 LAB — LACTIC ACID, PLASMA: Lactic Acid, Venous: 2.1 mmol/L (ref 0.5–2.0)

## 2015-03-28 LAB — PROCALCITONIN: Procalcitonin: 5.29 ng/mL

## 2015-03-28 LAB — BRAIN NATRIURETIC PEPTIDE: B NATRIURETIC PEPTIDE 5: 402 pg/mL — AB (ref 0.0–100.0)

## 2015-03-28 MED ORDER — INSULIN ASPART 100 UNIT/ML ~~LOC~~ SOLN
0.0000 [IU] | Freq: Three times a day (TID) | SUBCUTANEOUS | Status: DC
  Administered 2015-03-28: 3 [IU] via SUBCUTANEOUS
  Administered 2015-03-28: 7 [IU] via SUBCUTANEOUS
  Administered 2015-03-29 – 2015-03-30 (×3): 4 [IU] via SUBCUTANEOUS
  Administered 2015-03-30: 7 [IU] via SUBCUTANEOUS
  Administered 2015-03-30: 3 [IU] via SUBCUTANEOUS
  Administered 2015-03-31: 4 [IU] via SUBCUTANEOUS
  Filled 2015-03-28 (×2): qty 7
  Filled 2015-03-28: qty 4
  Filled 2015-03-28: qty 8
  Filled 2015-03-28: qty 3
  Filled 2015-03-28: qty 4
  Filled 2015-03-28: qty 3
  Filled 2015-03-28: qty 11
  Filled 2015-03-28: qty 4

## 2015-03-28 MED ORDER — INSULIN ASPART 100 UNIT/ML ~~LOC~~ SOLN
4.0000 [IU] | Freq: Three times a day (TID) | SUBCUTANEOUS | Status: DC
  Administered 2015-03-28 – 2015-03-31 (×7): 4 [IU] via SUBCUTANEOUS
  Filled 2015-03-28 (×6): qty 4

## 2015-03-28 MED ORDER — LEVOFLOXACIN IN D5W 250 MG/50ML IV SOLN
250.0000 mg | INTRAVENOUS | Status: DC
  Administered 2015-03-28 – 2015-03-30 (×3): 250 mg via INTRAVENOUS
  Filled 2015-03-28 (×4): qty 50

## 2015-03-28 MED ORDER — INSULIN GLARGINE 100 UNIT/ML ~~LOC~~ SOLN
30.0000 [IU] | Freq: Every day | SUBCUTANEOUS | Status: DC
  Administered 2015-03-28: 30 [IU] via SUBCUTANEOUS
  Filled 2015-03-28 (×2): qty 0.3

## 2015-03-28 MED ORDER — FUROSEMIDE 40 MG PO TABS
40.0000 mg | ORAL_TABLET | Freq: Two times a day (BID) | ORAL | Status: DC
  Administered 2015-03-28 – 2015-03-31 (×7): 40 mg via ORAL
  Filled 2015-03-28 (×7): qty 1

## 2015-03-28 MED ORDER — SODIUM CHLORIDE 0.9 % IV SOLN
INTRAVENOUS | Status: DC
  Administered 2015-03-28: 7.1 [IU]/h via INTRAVENOUS
  Administered 2015-03-28: 8 [IU]/h via INTRAVENOUS
  Administered 2015-03-28: 2.8 [IU]/h via INTRAVENOUS
  Administered 2015-03-28: 4.9 [IU]/h via INTRAVENOUS
  Administered 2015-03-28: 2.4 [IU]/h via INTRAVENOUS
  Administered 2015-03-28: 7 [IU]/h via INTRAVENOUS
  Filled 2015-03-28: qty 2.5

## 2015-03-28 MED ORDER — METOPROLOL TARTRATE 25 MG PO TABS
25.0000 mg | ORAL_TABLET | Freq: Two times a day (BID) | ORAL | Status: DC
  Administered 2015-03-28 – 2015-03-31 (×6): 25 mg via ORAL
  Filled 2015-03-28 (×6): qty 1

## 2015-03-28 MED ORDER — IPRATROPIUM-ALBUTEROL 0.5-2.5 (3) MG/3ML IN SOLN
3.0000 mL | Freq: Four times a day (QID) | RESPIRATORY_TRACT | Status: DC
Start: 2015-03-28 — End: 2015-03-31
  Administered 2015-03-28 – 2015-03-30 (×10): 3 mL via RESPIRATORY_TRACT
  Filled 2015-03-28 (×11): qty 3

## 2015-03-28 MED ORDER — INSULIN ASPART 100 UNIT/ML ~~LOC~~ SOLN
0.0000 [IU] | Freq: Every day | SUBCUTANEOUS | Status: DC
  Administered 2015-03-28: 0.3 [IU] via SUBCUTANEOUS
  Filled 2015-03-28: qty 11

## 2015-03-28 NOTE — Care Management Note (Signed)
Case Management Note  Patient Details  Name: Veronica Anderson MRN: [id number] Date of Birth: [birth year]  Subjective/Objective:    Admitted with acute hypoxic and hypercapnic respiratory failure due to bilateral pneumonia. Met with son, Sumi Lye at bedside. Patient lives at home with [name], his brothers and cousins. She uses O2 PRN and CPAP provided by [facility].  Son states patient has a BSC, wheel chair, walker. No current home health. PCP is Dr. Gwynneth Aliment. Son has concerns that CPAP doesn't work correctly due to a missing chip. Spoke with Will at [facility] and he advised son to take CPAP machine to their retail store and see if they can fix it.  Provided son with a list of local home health agencies for discharge planning.                 Action/Plan: RNCM to follow.   Expected Discharge Date:                  Expected Discharge Plan:  [place]  In-House Referral:     Discharge planning Services  CM Consult  Post Acute Care Choice:    Choice offered to:  Adult Children  DME Arranged:    DME Agency:     HH Arranged:    HH Agency:     Status of Service:  In process, will continue to follow  Medicare Important Message Given:    Date Medicare IM Given:    Medicare IM give by:    Date Additional Medicare IM Given:    Additional Medicare Important Message give by:     If discussed at [place] of Stay Meetings, dates discussed:    Additional Comments:  Jolly Mango, RN 03/28/2015, 9:35 AM

## 2015-03-28 NOTE — Progress Notes (Signed)
ANTIBIOTIC CONSULT NOTE - INITIAL  Pharmacy Consult for Levaquin Indication: pneumonia  Allergies  Allergen Reactions  . Ace Inhibitors Cough    Patient Measurements: Height: 5\' 6"  (167.6 cm) Weight: (!) 302 lb (136.986 kg) IBW/kg (Calculated) : 59.3  Vital Signs: Temp: 98.4 F (36.9 C) (09/14 0500) Temp Source: Axillary (09/14 0500) BP: 155/52 mmHg (09/14 0600) Pulse Rate: 53 (09/14 0600) Intake/Output from previous day: 09/13 0701 - 09/14 0700 In: 476 [I.V.:476] Out: -  Intake/Output from this shift:    Labs:  Recent Labs  03/27/15 1710 03/28/15 0540  WBC 17.5* 10.3  HGB 8.3* 7.5*  PLT 204 184  CREATININE 2.10* 2.04*   Estimated Creatinine Clearance: 37.1 mL/min (by C-G formula based on Cr of 2.04). No results for input(s): VANCOTROUGH, VANCOPEAK, VANCORANDOM, GENTTROUGH, GENTPEAK, GENTRANDOM, TOBRATROUGH, TOBRAPEAK, TOBRARND, AMIKACINPEAK, AMIKACINTROU, AMIKACIN in the last 72 hours.   Microbiology: No results found for this or any previous visit (from the past 720 hour(s)).  Medical History: Past Medical History  Diagnosis Date  . GERD (gastroesophageal reflux disease)   . Left ankle pain   . Requires supplemental oxygen   . Obesity   . Vitamin D deficiency   . Chronic kidney disease     stage IV (severe), Dr. Aleene Davidson  . Diabetes mellitus without complication   . Anemia   . Hyperlipidemia   . Hypertension   . CHF (NYHA class II, ACC/AHA stage C)   . Allergy   . Constipation   . Lumbago   . Asthma   . Protein calorie malnutrition   . OA (osteoarthritis)   . Obstructive sleep apnea syndrome   . Paresthesia     Foot  . Cataract     bilateral    Medications:  Scheduled:  . albuterol  2.5 mg Nebulization Q6H  . amLODipine  10 mg Oral Daily  . aspirin EC  81 mg Oral QHS  . cloNIDine  0.3 mg Transdermal Weekly  . enoxaparin (LOVENOX) injection  40 mg Subcutaneous Q12H  . gabapentin  300 mg Oral TID  . hydrALAZINE  50 mg Oral TID  .  ipratropium  0.5 mg Nebulization Q6H  . ipratropium-albuterol  3 mL Nebulization Once  . ipratropium-albuterol  3 mL Nebulization Once  . ipratropium-albuterol  3 mL Nebulization Once  . levofloxacin (LEVAQUIN) IV  250 mg Intravenous Q24H  . metoprolol  100 mg Oral BID  . montelukast  10 mg Oral QHS  . nystatin   Topical TID  . pantoprazole  40 mg Oral Daily  . pravastatin  40 mg Oral QHS  . Vitamin D (Ergocalciferol)  50,000 Units Oral Q7 days   Infusions:  . sodium chloride 50 mL/hr at 03/27/15 2114  . insulin (NOVOLIN-R) infusion 7 Units/hr (03/28/15 0617)   PRN: acetaminophen **OR** acetaminophen, loratadine, ondansetron **OR** ondansetron (ZOFRAN) IV  Assessment: 69 y/o F admitted with respiratory failure due to B/L PNA.   Plan:  Levaquin doses of 750 mg and 500 mg both given yesterday. Will renally adjust dosing to 250 mg daily and continue to follow renal function.  Follow up culture results  Napoleon Form 03/28/2015,7:11 AM

## 2015-03-28 NOTE — Progress Notes (Signed)
Received from ER. Alert and oriented. No complaints of SOB.  Remains on bi-pap 50%. Lung sound diminished.  NS and Insulin drip is in progress now. Resting comfortably in bed. Continue to observe closely.

## 2015-03-28 NOTE — Consult Note (Signed)
PULMONARY / CRITICAL CARE MEDICINE   Name: Veronica Anderson MRN: [id number] DOB: 15-Jan-1946    ADMISSION DATE:  03/27/2015 CONSULTATION DATE: 9/14  INITIAL PRESENTATION:  53 F with dyspnea and weakness of approx one week duration. Admitted to ICU with acute on chronic hypoxic resp failure and bilateral pulmonary infiltrates. Treated as PNA and pulmonary edema  MAJOR EVENTS/TEST RESULTS:   INDWELLING DEVICES::   MICRO DATA: MRSA PCR 9/14 >> NEG Urine Legionella Ag 9/14 >>  Urine Strep Ag 9/14 >>  Blood 9/13 >>  PCT 9/14: 5.29,    ANTIMICROBIALS:  Levofloxacin 9/13 >>     HISTORY OF PRESENT ILLNESS:   History as above. At the present time, she is comfortable on [place] O2. She denies CP, purulent sputum, hemoptysis or increased LE edema  PAST MEDICAL HISTORY :   has a past medical history of GERD (gastroesophageal reflux disease); Left ankle pain; Requires supplemental oxygen; Obesity; Vitamin D deficiency; Chronic kidney disease; Diabetes mellitus without complication; Anemia; Hyperlipidemia; Hypertension; CHF (NYHA class II, ACC/AHA stage C); Allergy; Constipation; Lumbago; Asthma; Protein calorie malnutrition; OA (osteoarthritis); Obstructive sleep apnea syndrome; Paresthesia; and Cataract.  has past surgical history that includes Ankle surgery (Left). Prior to Admission medications   Medication Sig Start Date End Date Taking? Authorizing Provider  amLODipine (NORVASC) 10 MG tablet Take 10 mg by mouth daily.   Yes Historical Provider, MD  aspirin EC 81 MG tablet Take 81 mg by mouth at bedtime.   Yes Historical Provider, MD  cloNIDine (CATAPRES-TTS-3) 0.3 mg/24hr patch Place 1 patch (0.3 mg total) onto the skin once a week. Patient taking differently: Place 0.3 mg onto the skin once a week. Pt applies patch on Sunday. 01/11/15  Yes Steele Sizer, MD  furosemide (LASIX) 20 MG tablet Take 20 mg by mouth 2 (two) times daily.   Yes Historical Provider, MD  gabapentin (NEURONTIN) 300 MG  capsule Take 300 mg by mouth 3 (three) times daily.   Yes Historical Provider, MD  hydrALAZINE (APRESOLINE) 50 MG tablet Take 1 tablet (50 mg total) by mouth 3 (three) times daily. 01/11/15  Yes Steele Sizer, MD  insulin glargine (LANTUS) 100 UNIT/ML injection Inject 42 Units into the skin at bedtime.   Yes Historical Provider, MD  insulin lispro (HUMALOG) 100 UNIT/ML injection Inject 5 Units into the skin 3 (three) times daily with meals.   Yes Historical Provider, MD  loratadine (CLARITIN) 10 MG tablet Take 10 mg by mouth daily as needed for allergies.   Yes Historical Provider, MD  metoprolol (LOPRESSOR) 100 MG tablet Take 100 mg by mouth 2 (two) times daily.   Yes Historical Provider, MD  montelukast (SINGULAIR) 10 MG tablet Take 10 mg by mouth at bedtime.   Yes Historical Provider, MD  omeprazole (PRILOSEC) 40 MG capsule Take 40 mg by mouth daily.   Yes Historical Provider, MD  pravastatin (PRAVACHOL) 40 MG tablet Take 40 mg by mouth at bedtime.   Yes Historical Provider, MD  Vitamin D, Ergocalciferol, (DRISDOL) 50000 UNITS CAPS capsule Take 50,000 Units by mouth every 7 (seven) days. Pt takes on Sunday.   Yes Historical Provider, MD   Allergies  Allergen Reactions  . Ace Inhibitors Cough    FAMILY HISTORY:  has no family status information on file.  SOCIAL HISTORY:  reports that she quit smoking about 20 years ago. Her smoking use included Cigarettes. She does not have any smokeless tobacco history on file. She reports that she does not drink alcohol or  use illicit drugs.  REVIEW OF SYSTEMS:  As per HPI. Otherwise, a detailed ROS is noncontributory   SUBJECTIVE:   VITAL SIGNS: Temp:  [97.6 F (36.4 C)-99.1 F (37.3 C)] 98.1 F (36.7 C) (09/14 1200) Pulse Rate:  [47-78] 50 (09/14 1200) Resp:  [12-29] 22 (09/14 1200) BP: (120-180)/(48-70) 120/58 mmHg (09/14 1200) SpO2:  [83 %-100 %] 90 % (09/14 1200) FiO2 (%):  [50 %-55 %] 50 % (09/14 0900) Weight:  [136.986 kg (302 lb)]  136.986 kg (302 lb) (09/13 1658) HEMODYNAMICS:   VENTILATOR SETTINGS: Vent Mode:  [-]  FiO2 (%):  [50 %-55 %] 50 % INTAKE / OUTPUT:  Intake/Output Summary (Last 24 hours) at 03/28/15 1515 Last data filed at 03/28/15 1125  Gross per 24 hour  Intake 495.81 ml  Output      0 ml  Net 495.81 ml    PHYSICAL EXAMINATION: General: Chronically ill appearing, NAD, RASS 0, oriented Neuro: CNs intact, motor/sens intact, DTRs symmetric HEENT: NCAT, WNL Cardiovascular: brady, reg, no M Lungs: diffuse bilateral crackles Abdomen: obese, soft, NT, +BS Ext: trace symmetric brawny edema  LABS:  CBC  Recent Labs Lab 03/27/15 1710 03/28/15 0540  WBC 17.5* 10.3  HGB 8.3* 7.5*  HCT 26.9* 25.2*  PLT 204 184   Coag's No results for input(s): APTT, INR in the last 168 hours. BMET  Recent Labs Lab 03/27/15 1710 03/28/15 0540  NA 140 139  K 4.7 4.1  CL 101 102  CO2 26 29  BUN 48* 47*  CREATININE 2.10* 2.04*  GLUCOSE 210* 265*   Electrolytes  Recent Labs Lab 03/27/15 1710 03/28/15 0540  CALCIUM 8.7* 8.5*   Sepsis Markers  Recent Labs Lab 03/27/15 2238 03/28/15 0540  LATICACIDVEN 2.7* 2.1*  PROCALCITON  --  5.29   ABG  Recent Labs Lab 03/27/15 2243  PHART 7.37  PCO2ART 50*  PO2ART 83   Liver Enzymes No results for input(s): AST, ALT, ALKPHOS, BILITOT, ALBUMIN in the last 168 hours. Cardiac Enzymes  Recent Labs Lab 03/27/15 1710  TROPONINI 0.06*   Glucose  Recent Labs Lab 03/28/15 0613 03/28/15 0713 03/28/15 0834 03/28/15 0954 03/28/15 1044 03/28/15 1205  GLUCAP 200* 167* 119* 92 107* 140*    CXR: diffuse infiltrates c/w edema vs ALI    ASSESSMENT / PLAN:  PULMONARY A: Acute on chronic hypoxic respiratory failure Chronic hypercarbic respiratory failure (Mild) OSA on nCPAP @ home Bilateral pulmonary infiltrates c/w edema vs ALI P:   Cont diuresis as permitted by BP and renal function Cont supplemental O2 to maintain SpO2 > 90% Cont  nebulized bronchodilators DC montelukast in setting pulmonary infiltrates Will need re-evaluation of OSA post discharge and I have scheduled for outpt pulm followup in 3-4 wks   CARDIOVASCULAR A:  Hypertension H/O CHF Sinus bradycardia P:  Cont current antihypertensive medications Check TSH in AM 9/15  RENAL A:   AKI CKD - baseline Cr 1.74 P:   Monitor BMET intermittently Monitor I/Os Correct electrolytes as indicated  GASTROINTESTINAL A:   Obesity Chronic PPI use P:   SUP: PO pantoprazole Cont carb mod diet  HEMATOLOGIC A:   Moderate anemia without overt bleeding P:  DVT px: SQ heparin Monitor CBC intermittently Transfuse per usual guidelines  INFECTIOUS A:   Elevated procalcitonin Suspected PNA, NOS P:   Monitor temp, WBC count Micro and abx as above  ENDOCRINE A:  DM 2 P:   Cont Lantus SSI adjusted  NEUROLOGIC A:   Chronic deconditioning P:  RASS goal: 0 Will likely need PT/rehab after discharge   FAMILY: Pt and son updated @ bedside    Merton Border, MD PCCM service Mobile [phone] Pager [phone]   03/28/2015, 3:15 PM

## 2015-03-28 NOTE — Progress Notes (Signed)
Patient alert and oriented, vital signs stable, sinus rhythm on cardiac monitor.  Patient denies pain/discomfort this shift.  Patient currently off bipap, insulin gtt discontinued by Dr Alva Garnet who ordered 30unit of lantus for bedtime,  MD notified that patient states that at home patient takes 45 units when eating well but only 30 units if not eating well. Patient currently resting in no apparent distress with son at bedside.  RN continue to monitor.

## 2015-03-28 NOTE — Progress Notes (Addendum)
Inpatient Diabetes Program Recommendations  AACE/ADA: New Consensus Statement on Inpatient Glycemic Control (2015)  Target Ranges:  Prepandial:   less than 140 mg/dL      Peak postprandial:   less than 180 mg/dL (1-2 hours)      Critically ill patients:  140 - 180 mg/dL  Results for ALEC, MCPHEE (MRN [id number]) as of 03/28/2015 09:26  Ref. Range 03/28/2015 01:58 03/28/2015 02:57 03/28/2015 04:14 03/28/2015 05:04 03/28/2015 06:13 03/28/2015 07:13 03/28/2015 08:34  Glucose-Capillary Latest Ref Range: 65-99 mg/dL 340 (H) 305 (H) 298 (H) 259 (H) 200 (H) 167 (H) 119 (H)   Review of Glycemic Control  Diabetes history: DM2 Outpatient Diabetes medications: Lantus 42 units QHS, Humalog 5 units TID with meals Current orders for Inpatient glycemic control: Insulin drip per ICU Glycemic Control Phase 2 order set  Inpatient Diabetes Program Recommendations: Insulin - IV drip/GlucoStabilizer: Please continue IV insulin drip per Phase 2 ICU Glycemic Control protocol until criteria is met and at that time transition to Phase 3 of ICU Glycemic Control order set.   Insulin - Basal: Note patient received Lantus 42 units on 03/27/15 at 23:50.   Note: Patient was admitted with bilateral pneumonia and received a one time dose of Solumedrol 125 mg on 03/27/15 at 17:22 which is contributing to hyperglycemia. Note patient received Lantus 42 units on 03/27/15 at 23:50 prior to being started on the insulin drip. Agree with continuing insulin drip until parameters are met to transition to Phase 3 of the ICU Glycemic Control order set. Per the protocol, will initiate Phase 3 once all of the following are met:  the insulin infusion rate is < 4 units/hr, tube feeds are at goal rate and stable (if ordered), and there are 6 subsequent CBG reading < 180 mg/dl.    Once criteria is met to transition, would recommend not using the order set for basal insulin since patient has active basal insulin that is still working since patient last  received Lantus 42 units at 23:50 last night. Instead recommend ordering Lantus 42 units QHS. At time of transition to SQ insulin, use Novolog correction scale continue to check glucose Q4H.  Thanks, Barnie Alderman, RN, MSN, CCRN, CDE Diabetes Coordinator Inpatient Diabetes Program [phone] (Team Pager from [place] to [place]) [phone] ([place] office) [phone] [facility] office) [phone] [facility] office)

## 2015-03-28 NOTE — Progress Notes (Signed)
[place] at [place] NAME: Veronica Anderson    MR#:  [id number]  DATE OF BIRTH:  September 17, 1945  SUBJECTIVE:  CHIEF COMPLAINT:   Chief Complaint  Patient presents with  . Shortness of Breath   patient continues to be on BiPAP. Afebrile. Orthopnea positive.  REVIEW OF SYSTEMS:    Review of Systems  Constitutional: Positive for malaise/fatigue. Negative for fever and chills.  HENT: Negative for sore throat.   Eyes: Negative for blurred vision, double vision and pain.  Respiratory: Positive for cough, shortness of breath and wheezing. Negative for hemoptysis.   Cardiovascular: Positive for orthopnea. Negative for chest pain, palpitations and leg swelling.  Gastrointestinal: Negative for heartburn, nausea, vomiting, abdominal pain, diarrhea and constipation.  Genitourinary: Negative for dysuria and hematuria.  Musculoskeletal: Positive for back pain. Negative for joint pain.  Skin: Negative for rash.  Neurological: Positive for weakness. Negative for sensory change, speech change, focal weakness and headaches.  Endo/Heme/Allergies: Does not bruise/bleed easily.  Psychiatric/Behavioral: Negative for depression. The patient is not nervous/anxious.       DRUG ALLERGIES:   Allergies  Allergen Reactions  . Ace Inhibitors Cough    VITALS:  Blood pressure 137/57, pulse 47, temperature 97.6 F (36.4 C), temperature source Axillary, resp. rate 16, height 5\' 6"  (1.676 m), weight 136.986 kg (302 lb), SpO2 99 %.  PHYSICAL EXAMINATION:   Physical Exam  GENERAL:  69 y.o.-year-old patient lying in the bed with no acute distress. Looks critically ill. Morbidly obese EYES: Pupils equal, round, reactive to light and accommodation. No scleral icterus. Extraocular muscles intact.  HEENT: Head atraumatic, normocephalic. Oropharynx and nasopharynx clear.  NECK:  Supple, no jugular venous distention. No thyroid enlargement, no tenderness.  LUNGS:  Increased work of breathing. Coarse breath sounds with wheezing bilaterally. CARDIOVASCULAR: S1, S2 normal. No murmurs, rubs, or gallops.  ABDOMEN: Soft, nontender, nondistended. Bowel sounds present. No organomegaly or mass.  EXTREMITIES: No cyanosis, clubbing or edema b/l.    NEUROLOGIC: Cranial nerves II through XII are intact. No focal Motor or sensory deficits b/l.   PSYCHIATRIC: The patient is alert and oriented x 3.  SKIN: No obvious rash, lesion, or ulcer.    LABORATORY PANEL:   CBC  Recent Labs Lab 03/28/15 0540  WBC 10.3  HGB 7.5*  HCT 25.2*  PLT 184   ------------------------------------------------------------------------------------------------------------------  Chemistries   Recent Labs Lab 03/28/15 0540  NA 139  K 4.1  CL 102  CO2 29  GLUCOSE 265*  BUN 47*  CREATININE 2.04*  CALCIUM 8.5*   ------------------------------------------------------------------------------------------------------------------  Cardiac Enzymes  Recent Labs Lab 03/27/15 1710  TROPONINI 0.06*   ------------------------------------------------------------------------------------------------------------------  RADIOLOGY:  Dg Chest Portable 1 View  03/27/2015   CLINICAL DATA:  Patient with shortness of breath.  EXAM: PORTABLE CHEST - 1 VIEW  COMPARISON:  Chest radiograph 04/08/2014  FINDINGS: Monitoring leads overlie the patient. Patient is rotated. Cardiomegaly. Interval development of diffuse bilateral heterogeneous pulmonary opacities with pulmonary vascular redistribution. Possible small bilateral pleural effusions. Radiodensity projecting over right upper hemi thorax.  IMPRESSION: Radiodensity projecting over the right upper hemi thorax is nonspecific. Recommend correlation for being external to the patient.  Cardiomegaly. New diffuse bilateral airspace opacities may represent edema or multi focal infection.   Electronically Signed   By: Lovey Newcomer M.D.   On: 03/27/2015  17:57     ASSESSMENT AND PLAN:   Patient is a 69 year old African-American female admitted for acute respiratory failure  1. Acute hypoxic and hypercapnic respiratory failure due to bilateral pneumonia On IV antibiotics. Cultures pending. Nebulizers when necessary. Wean off BiPAP onto nasal cannula as tolerated. His case with Dr. Leonidas Romberg of pulmonary who will be seeing the patient.  2. Diabetes type 2: Lantus, sliding scale  3. Hypertension continue Norvasc and clonidine  4. CKD stage IV  Creatinine is stable around 2. Continue Lasix from home and monitor.  5. Sleep apnea patient currently on the BiPAP changed to CPAP once off BiPAP  6. Chronic diastolic congestive heart failure No edema or JVD. Patient will be on her home dose of oral Lasix.  7. Anemia of chronic disease due to CKD4 Need for transfusion.   All the records are reviewed and case discussed with Care Management/Social Workerr. Management plans discussed with the patient, family and they are in agreement.  CODE STATUS: FULL  DVT Prophylaxis: SCDs  TOTAL CC TIME TAKING CARE OF THIS PATIENT: 40 minutes.   POSSIBLE D/C IN 2-3 DAYS, DEPENDING ON CLINICAL CONDITION.   Hillary Bow R M.D on 03/28/2015 at 9:19 AM  Between 7am to 6pm - Pager - [phone]  After 6pm go to [email or web address] - password [email or web address] [place] [facility]  Office  [phone]  CC: Primary care physician; Loistine Chance, MD

## 2015-03-29 ENCOUNTER — Inpatient Hospital Stay: Payer: Medicare Other

## 2015-03-29 DIAGNOSIS — R7881 Bacteremia: Secondary | ICD-10-CM

## 2015-03-29 DIAGNOSIS — J9601 Acute respiratory failure with hypoxia: Secondary | ICD-10-CM

## 2015-03-29 DIAGNOSIS — R799 Abnormal finding of blood chemistry, unspecified: Secondary | ICD-10-CM

## 2015-03-29 LAB — GLUCOSE, CAPILLARY
GLUCOSE-CAPILLARY: 180 mg/dL — AB (ref 65–99)
GLUCOSE-CAPILLARY: 194 mg/dL — AB (ref 65–99)
Glucose-Capillary: 194 mg/dL — ABNORMAL HIGH (ref 65–99)
Glucose-Capillary: 180 mg/dL — ABNORMAL HIGH (ref 65–99)
Glucose-Capillary: 92 mg/dL (ref 65–99)
Glucose-Capillary: 156 mg/dL — ABNORMAL HIGH (ref 65–99)

## 2015-03-29 LAB — BASIC METABOLIC PANEL
ANION GAP: 9 (ref 5–15)
BUN: 55 mg/dL — ABNORMAL HIGH (ref 6–20)
CHLORIDE: 101 mmol/L (ref 101–111)
CO2: 30 mmol/L (ref 22–32)
Calcium: 8.5 mg/dL — ABNORMAL LOW (ref 8.9–10.3)
Creatinine, Ser: 2.11 mg/dL — ABNORMAL HIGH (ref 0.44–1.00)
GFR calc non Af Amer: 23 mL/min — ABNORMAL LOW (ref >60)
GFR, EST AFRICAN AMERICAN: 26 mL/min — AB (ref >60)
GLUCOSE: 195 mg/dL — AB (ref 65–99)
Potassium: 4.2 mmol/L (ref 3.5–5.1)
Sodium: 140 mmol/L (ref 135–145)

## 2015-03-29 LAB — CBC
HEMATOCRIT: 23.8 % — AB (ref 35.0–47.0)
HEMOGLOBIN: 7.1 g/dL — AB (ref 12.0–16.0)
MCH: 18.4 pg — AB (ref 26.0–34.0)
MCHC: 29.9 g/dL — ABNORMAL LOW (ref 32.0–36.0)
MCV: 61.5 fL — ABNORMAL LOW (ref 80.0–100.0)
Platelets: 231 10*3/uL (ref 150–440)
RBC: 3.86 MIL/uL (ref 3.80–5.20)
RDW: 22.9 % — ABNORMAL HIGH (ref 11.5–14.5)
WBC: 15.1 10*3/uL — ABNORMAL HIGH (ref 3.6–11.0)

## 2015-03-29 LAB — IRON AND TIBC
Iron: 19 ug/dL — ABNORMAL LOW (ref 28–170)
SATURATION RATIOS: 6 % — AB (ref 10.4–31.8)
TIBC: 314 ug/dL (ref 250–450)
UIBC: 295 ug/dL

## 2015-03-29 LAB — FERRITIN: FERRITIN: 40 ng/mL (ref 11–307)

## 2015-03-29 LAB — TSH: TSH: 1.974 u[IU]/mL (ref 0.350–4.500)

## 2015-03-29 LAB — PROCALCITONIN: Procalcitonin: 7.05 ng/mL

## 2015-03-29 LAB — LEGIONELLA ANTIGEN, URINE

## 2015-03-29 MED ORDER — VANCOMYCIN HCL 10 G IV SOLR
1500.0000 mg | INTRAVENOUS | Status: DC
  Administered 2015-03-29: 1500 mg via INTRAVENOUS
  Filled 2015-03-29 (×2): qty 1500

## 2015-03-29 MED ORDER — SODIUM CHLORIDE 0.9 % IV SOLN
1500.0000 mg | Freq: Once | INTRAVENOUS | Status: AC
  Filled 2015-03-29: qty 1500

## 2015-03-29 MED ORDER — HYDRALAZINE HCL 50 MG PO TABS
100.0000 mg | ORAL_TABLET | Freq: Three times a day (TID) | ORAL | Status: DC
  Administered 2015-03-29 – 2015-03-31 (×6): 100 mg via ORAL
  Filled 2015-03-29 (×6): qty 2

## 2015-03-29 MED ORDER — VANCOMYCIN HCL 10 G IV SOLR
1500.0000 mg | Freq: Once | INTRAVENOUS | Status: AC
  Administered 2015-03-29: 1500 mg via INTRAVENOUS
  Filled 2015-03-29 (×2): qty 1500

## 2015-03-29 MED ORDER — INSULIN GLARGINE 100 UNIT/ML ~~LOC~~ SOLN
36.0000 [IU] | Freq: Every day | SUBCUTANEOUS | Status: DC
  Filled 2015-03-29 (×3): qty 0.36

## 2015-03-29 NOTE — Progress Notes (Signed)
Patient sitting up in chair in no distress.  Patient with order for transfer to Telemetry.  Room assignment obtained for room 233, patient and son updated.  Belonging's packed ready to go with patient. Reported called to receiving RN, Junious Dresser with no further questions.

## 2015-03-29 NOTE — Progress Notes (Signed)
[facility] [place] NAME: Veronica Anderson    MR#:  [id number]  DATE OF BIRTH:  June 29, 1946  SUBJECTIVE:  CHIEF COMPLAINT:   Chief Complaint  Patient presents with  . Shortness of Breath  Sitting up in a chair. Feels better. Increased urine Output. On O2 at home  REVIEW OF SYSTEMS:    Review of Systems  Constitutional: Positive for malaise/fatigue. Negative for fever and chills.  HENT: Negative for sore throat.   Eyes: Negative for blurred vision, double vision and pain.  Respiratory: Positive for cough, shortness of breath and wheezing. Negative for hemoptysis.   Cardiovascular: Positive for orthopnea. Negative for chest pain, palpitations and leg swelling.  Gastrointestinal: Negative for heartburn, nausea, vomiting, abdominal pain, diarrhea and constipation.  Genitourinary: Negative for dysuria and hematuria.  Musculoskeletal: Positive for back pain. Negative for joint pain.  Skin: Negative for rash.  Neurological: Positive for weakness. Negative for sensory change, speech change, focal weakness and headaches.  Endo/Heme/Allergies: Does not bruise/bleed easily.  Psychiatric/Behavioral: Negative for depression. The patient is not nervous/anxious.       DRUG ALLERGIES:   Allergies  Allergen Reactions  . Ace Inhibitors Cough    VITALS:  Blood pressure 163/74, pulse 67, temperature 98.2 F (36.8 C), temperature source Oral, resp. rate 15, height 5\' 6"  (1.676 m), weight 136.986 kg (302 lb), SpO2 89 %.  PHYSICAL EXAMINATION:   Physical Exam  GENERAL:  69 y.o.-year-old patient lying in the bed with no acute distress. Morbidly obese EYES: Pupils equal, round, reactive to light and accommodation. No scleral icterus. Extraocular muscles intact.  HEENT: Head atraumatic, normocephalic. Oropharynx and nasopharynx clear.  NECK:  Supple, no jugular venous distention. No thyroid enlargement, no tenderness.  LUNGS: Good air entry  bilaterally CARDIOVASCULAR: S1, S2 normal. No murmurs, rubs, or gallops.  ABDOMEN: Soft, nontender, nondistended. Bowel sounds present. No organomegaly or mass.  EXTREMITIES: No cyanosis, clubbing or edema b/l.    NEUROLOGIC: Cranial nerves II through XII are intact. No focal Motor or sensory deficits b/l.   PSYCHIATRIC: The patient is alert and oriented x 3.  SKIN: No obvious rash, lesion, or ulcer.    LABORATORY PANEL:   CBC  Recent Labs Lab 03/29/15 0722  WBC 15.1*  HGB 7.1*  HCT 23.8*  PLT 231   ------------------------------------------------------------------------------------------------------------------  Chemistries   Recent Labs Lab 03/29/15 0424  NA 140  K 4.2  CL 101  CO2 30  GLUCOSE 195*  BUN 55*  CREATININE 2.11*  CALCIUM 8.5*   ------------------------------------------------------------------------------------------------------------------  Cardiac Enzymes  Recent Labs Lab 03/27/15 1710  TROPONINI 0.06*   ------------------------------------------------------------------------------------------------------------------  RADIOLOGY:  Dg Chest Port 1 View  03/29/2015   CLINICAL DATA:  Respiratory failure  EXAM: PORTABLE CHEST - 1 VIEW  COMPARISON:  Portable chest x-ray of 03/27/2015  FINDINGS: There is little change to perhaps very minimal improvement in patchy airspace disease diffusely. Cardiomegaly is stable.  IMPRESSION: Little change to slight improvement in patchy airspace disease diffusely.   Electronically Signed   By: Ivar Drape M.D.   On: 03/29/2015 08:01   Dg Chest Portable 1 View  03/27/2015   CLINICAL DATA:  Patient with shortness of breath.  EXAM: PORTABLE CHEST - 1 VIEW  COMPARISON:  Chest radiograph 04/08/2014  FINDINGS: Monitoring leads overlie the patient. Patient is rotated. Cardiomegaly. Interval development of diffuse bilateral heterogeneous pulmonary opacities with pulmonary vascular redistribution. Possible small bilateral  pleural effusions. Radiodensity projecting over right upper  hemi thorax.  IMPRESSION: Radiodensity projecting over the right upper hemi thorax is nonspecific. Recommend correlation for being external to the patient.  Cardiomegaly. New diffuse bilateral airspace opacities may represent edema or multi focal infection.   Electronically Signed   By: Lovey Newcomer M.D.   On: 03/27/2015 17:57     ASSESSMENT AND PLAN:   Patient is a 69 year old African-American female admitted for acute respiratory failure  1. Acute hypoxic and hypercapnic respiratory failure due to bilateral pneumonia On IV antibiotics. Cultures pending. Nebulizers Wean O2  2. Diabetes type 2: Lantus, sliding scale  3. Hypertension continue Norvasc and clonidine  4. CKD stage IV  Creatinine is stable around 2. Continue Lasix from home and monitor.  5. Sleep apnea patient currently on the BiPAP changed to CPAP once off BiPAP  6. Chronic diastolic congestive heart failure No edema or JVD. Patient will be on her home dose of oral Lasix.  7. Anemia of chronic disease due to CKD4. >7 No Need for transfusion. Check iron studies and stool for occult blood    All the records are reviewed and case discussed with Care Management/Social Workerr. Management plans discussed with the patient, family and they are in agreement.  CODE STATUS: FULL  DVT Prophylaxis: SCDs  TOTAL TIME TAKING CARE OF THIS PATIENT: 35 minutes.   POSSIBLE D/C IN 2-3 DAYS, DEPENDING ON CLINICAL CONDITION.   Hillary Bow R M.D on 03/29/2015 at 9:44 AM  Between 7am to 6pm - Pager - [phone]  After 6pm go to [email or web address] - password [email or web address] [facility]  Office  [phone]  CC: Primary care physician; Loistine Chance, MD

## 2015-03-29 NOTE — Progress Notes (Signed)
PULMONARY / CRITICAL CARE MEDICINE   Name: Veronica Anderson MRN: [id number] DOB: 1946-05-26    ADMISSION DATE:  03/27/2015 CONSULTATION DATE: 9/14  INITIAL PRESENTATION:  50 F with dyspnea and weakness of approx one week duration. Admitted to ICU with acute on chronic hypoxic resp failure and bilateral pulmonary infiltrates. Treated as PNA and pulmonary edema  MAJOR EVENTS/TEST RESULTS:   INDWELLING DEVICES::   MICRO DATA: MRSA PCR 9/14 >> NEG Urine Legionella Ag 9/14 >>  Urine Strep Ag 9/14 >>  Blood 9/13 >> 1/2 GPC clusters >>  PCT 9/14: 5.29,  9/15: 7.05,  9/16:   ANTIMICROBIALS:  Levofloxacin 9/13 >>  Vancomycin 9/15 >>   SUBJECTIVE:  Much improved. No distress. No new complaints  VITAL SIGNS: Temp:  [98 F (36.7 C)-98.4 F (36.9 C)] 98 F (36.7 C) (09/15 1057) Pulse Rate:  [54-76] 67 (09/15 1057) Resp:  [10-24] 17 (09/15 1057) BP: (138-187)/(42-74) 167/46 mmHg (09/15 1102) SpO2:  [89 %-99 %] 89 % (09/15 1057) Weight:  [139.617 kg (307 lb 12.8 oz)] 139.617 kg (307 lb 12.8 oz) (09/15 1057) HEMODYNAMICS:   VENTILATOR SETTINGS:   INTAKE / OUTPUT:  Intake/Output Summary (Last 24 hours) at 03/29/15 1417 Last data filed at 03/29/15 0925  Gross per 24 hour  Intake    480 ml  Output    350 ml  Net    130 ml    PHYSICAL EXAMINATION: General: Chronically ill appearing, NAD, RASS 0, oriented Neuro: CNs intact, motor/sens intact, DTRs symmetric HEENT: NCAT, WNL Cardiovascular: brady, reg, no M Lungs: R basilar crackles Abdomen: obese, soft, NT, +BS Ext: minimal symmetric brawny edema  LABS:  CBC  Recent Labs Lab 03/27/15 1710 03/28/15 0540 03/29/15 0722  WBC 17.5* 10.3 15.1*  HGB 8.3* 7.5* 7.1*  HCT 26.9* 25.2* 23.8*  PLT 204 184 231   Coag's No results for input(s): APTT, INR in the last 168 hours. BMET  Recent Labs Lab 03/27/15 1710 03/28/15 0540 03/29/15 0424  NA 140 139 140  K 4.7 4.1 4.2  CL 101 102 101  CO2 26 29 30   BUN 48* 47* 55*   CREATININE 2.10* 2.04* 2.11*  GLUCOSE 210* 265* 195*   Electrolytes  Recent Labs Lab 03/27/15 1710 03/28/15 0540 03/29/15 0424  CALCIUM 8.7* 8.5* 8.5*   Sepsis Markers  Recent Labs Lab 03/27/15 2238 03/28/15 0540 03/29/15 0424  LATICACIDVEN 2.7* 2.1*  --   PROCALCITON  --  5.29 7.05   ABG  Recent Labs Lab 03/27/15 2243  PHART 7.37  PCO2ART 50*  PO2ART 83   Liver Enzymes No results for input(s): AST, ALT, ALKPHOS, BILITOT, ALBUMIN in the last 168 hours. Cardiac Enzymes  Recent Labs Lab 03/27/15 1710  TROPONINI 0.06*   Glucose  Recent Labs Lab 03/28/15 1628 03/28/15 1949 03/29/15 0016 03/29/15 0401 03/29/15 0737 03/29/15 1142  GLUCAP 243* 254* 194* 194* 180* 180*    CXR: improving infiltrates    ASSESSMENT / PLAN:  PULMONARY A: Acute on chronic hypoxic respiratory failure Chronic hypercarbic respiratory failure (Mild) OSA on nCPAP @ home Bilateral pulmonary infiltrates c/w edema vs ALI P:   Cont diuresis as permitted by BP and renal function Cont supplemental O2 to maintain SpO2 > 90% Cont nebulized bronchodilators DC montelukast in setting pulmonary infiltrates Will need repeat polysomnogram post discharge and I have scheduled for outpt pulm followup in 3-4 wks   CARDIOVASCULAR A:  Hypertension H/O CHF Sinus bradycardia, resolved P:  Cont current antihypertensive medications  RENAL A:  AKI CKD - baseline Cr 1.74 P:   Monitor BMET intermittently Monitor I/Os Correct electrolytes as indicated  GASTROINTESTINAL A:   Obesity Chronic PPI use P:   SUP: PO pantoprazole Cont carb mod diet  HEMATOLOGIC A:   Moderate anemia without overt bleeding P:  DVT px: SQ heparin Monitor CBC intermittently Transfuse per usual guidelines  INFECTIOUS A:   Elevated procalcitonin Suspected PNA, NOS "+" blood cx - likely contaminant Elevated PCT and WBC count - both rising  This is worrisome for an untreated bacterial  infection P:   Monitor temp, WBC count Micro and abx as above Vanc added 9/15  ENDOCRINE A:  DM 2 P:   Cont Lantus Cont SSI   NEUROLOGIC A:   Chronic deconditioning P:   RASS goal: 0 Will likely need PT/rehab after discharge PT eval requested 9/15  Appears ready for transfer to med-surg floor but I am concerned about elevated PCT and rising WBC count are worrisome. Vanc added today. F/U on the "+" blood cx. Repeat PCT in AM 9/16.    Merton Border, MD PCCM service Mobile [phone] Pager [phone]   03/29/2015, 2:17 PM

## 2015-03-29 NOTE — Progress Notes (Signed)
PT Cancellation Note  Patient Details Name: Jesika Harvie MRN: [id number] DOB: May 29, 1946   Cancelled Treatment:    Reason Eval/Treat Not Completed: Patient declined, no reason specified (Patient refused at this time; unable to redirect or encourage.  Will re-attempt at later time/date as medically appropriate and patient agreeable.)   Alby Schwabe H. Owens Shark, PT, DPT, NCS 03/29/2015, 3:22 PM [phone]

## 2015-03-29 NOTE — Clinical Documentation Improvement (Addendum)
Hospitalist  "Sepsis" is documented in the current medical as noted below x 2, but the diagnosis has not been documented in subsequent progress notes.  To assist with accurate code assignment, please document in the progress notes (not on the query form itself) if the diagnosis of Sepsis has been:   - Confirmed and present on admission (POA)  - Ruled out and is not applicable to this admission  - Unable to clinically determine   Clinical Information: 1)  "I did have concern for sepsis with multiorgan system failure. Wrote for broad spectrum antibiotics. Will admit patient." is documented in the ED provider note.  2)  "Sepsis - evaluation and management;Infection - evaluation and management" documented by Dr. Joya Gaskins 03/27/15.  3)   - Patient admitted with bilateral pneumonia       - WBC on admission 17.5       - Lactic Acid trend this admission 2.7, 2.1       - Procalcitonin 5.29 on 03/28/15   Please exercise your independent, professional judgment when responding. A specific answer is not anticipated or expected.   Thank You, Erling Conte  RN BSN [place] [phone]

## 2015-03-29 NOTE — Progress Notes (Signed)
ANTIBIOTIC CONSULT NOTE - INITIAL  Pharmacy Consult for Vancomycin Indication: GPC bacteremia  Allergies  Allergen Reactions  . Ace Inhibitors Cough    Patient Measurements: Height: 5\' 6"  (167.6 cm) Weight: (!) 302 lb (136.986 kg) IBW/kg (Calculated) : 59.3 Adjusted Body Weight: 84.6  Vital Signs: Temp: 98 F (36.7 C) (09/15 1057) Temp Source: Oral (09/15 0800) BP: 187/46 mmHg (09/15 1057) Pulse Rate: 67 (09/15 1057) Intake/Output from previous day: 09/14 0701 - 09/15 0700 In: 259.8 [P.O.:240; I.V.:19.8] Out: 350 [Urine:350]  Labs:  Recent Labs  03/27/15 1710 03/28/15 0540 03/29/15 0424 03/29/15 0722  WBC 17.5* 10.3  --  15.1*  HGB 8.3* 7.5*  --  7.1*  PLT 204 184  --  231  CREATININE 2.10* 2.04* 2.11*  --    Estimated Creatinine Clearance: 35.9 mL/min (by C-G formula based on Cr of 2.11).   Microbiology: Recent Results (from the past 720 hour(s))  Blood culture (routine x 2)     Status: None (Preliminary result)   Collection Time: 03/27/15  6:37 PM  Result Value Ref Range Status   Specimen Description BLOOD RIGHT HAND  Final   Special Requests BOTTLES DRAWN AEROBIC AND ANAEROBIC  1CC  Final   Culture NO GROWTH < 24 HOURS  Final   Report Status PENDING  Incomplete  Blood culture (routine x 2)     Status: None (Preliminary result)   Collection Time: 03/27/15  6:54 PM  Result Value Ref Range Status   Specimen Description BLOOD RIGHT ASSIST CONTROL  Final   Special Requests   Final    BOTTLES DRAWN AEROBIC AND ANAEROBIC  10CC AEROBIC, 5CC ANAEROBIC   Culture  Setup Time   Final    GRAM POSITIVE COCCI IN CLUSTERS AEROBIC BOTTLE ONLY CRITICAL RESULT CALLED TO, READ BACK BY AND VERIFIED WITH: DELL HOPKINS 03/29/2015 0110 [place] VERIFIED BY AJO    Culture   Final    GRAM POSITIVE COCCI IN CLUSTERS AEROBIC BOTTLE ONLY IDENTIFICATION TO FOLLOW    Report Status PENDING  Incomplete  MRSA PCR Screening     Status: None   Collection Time: 03/28/15 10:45 AM   Result Value Ref Range Status   MRSA by PCR NEGATIVE NEGATIVE Final    Comment:        The GeneXpert MRSA Assay (FDA approved for NASAL specimens only), is one component of a comprehensive MRSA colonization surveillance program. It is not intended to diagnose MRSA infection nor to guide or monitor treatment for MRSA infections.     Medical History: Past Medical History  Diagnosis Date  . GERD (gastroesophageal reflux disease)   . Left ankle pain   . Requires supplemental oxygen   . Obesity   . Vitamin D deficiency   . Chronic kidney disease     stage IV (severe), Dr. Aleene Davidson  . Diabetes mellitus without complication   . Anemia   . Hyperlipidemia   . Hypertension   . CHF (NYHA class II, ACC/AHA stage C)   . Allergy   . Constipation   . Lumbago   . Asthma   . Protein calorie malnutrition   . OA (osteoarthritis)   . Obstructive sleep apnea syndrome   . Paresthesia     Foot  . Cataract     bilateral    Medications:  Scheduled:  . amLODipine  10 mg Oral Daily  . aspirin EC  81 mg Oral QHS  . cloNIDine  0.3 mg Transdermal Weekly  . enoxaparin (LOVENOX)  injection  40 mg Subcutaneous Q12H  . furosemide  40 mg Oral BID  . gabapentin  300 mg Oral TID  . hydrALAZINE  50 mg Oral TID  . insulin aspart  0-20 Units Subcutaneous TID WC  . insulin aspart  0-5 Units Subcutaneous QHS  . insulin aspart  4 Units Subcutaneous TID WC  . insulin glargine  36 Units Subcutaneous QHS  . ipratropium-albuterol  3 mL Nebulization Q6H  . levofloxacin (LEVAQUIN) IV  250 mg Intravenous Q24H  . metoprolol  25 mg Oral BID  . nystatin   Topical TID  . pantoprazole  40 mg Oral Daily  . pravastatin  40 mg Oral QHS  . vancomycin  1,500 mg Intravenous Once  . vancomycin  1,500 mg Intravenous Once  . [START ON 03/30/2015] vancomycin  1,500 mg Intravenous Q24H  . Vitamin D (Ergocalciferol)  50,000 Units Oral Q7 days   Assessment: Veronica Anderson is a 69 yo obese female admitted on 9/13 for respiratory  failue 2/2 bilateral PNA. Pt was on levofloxacin 250mg  IV q24hrs (start 9/14) due to renal function.  WBC 17.5-->10.3-->15.1 today 9/13 BCx 1/2 positive for GPC in clusters  Goal of Therapy:  Vancomycin trough level 15-20 mcg/ml  Plan:  Pt has stable renal function (Scr ~2 for length of admission) and morbid obesity (Body mass index is 48.77 kg/(m^2). which complicate vancomycin dosing.  CrCl 33.6 ml/min T1/2= 21.5 hrs Ke= 0.032 hr-1 Vd= 95.9 L  Dose patient at vancomycin 1500mg  x1 followed by stacked dosing 8 hours later and then q24hrs thereafter.  Estimated Cpk= 26.7, estimated Ctr= 13.58. Air on cautious side of dosing due to weight, renal fxn, and risk of accumulation.   Vanc trough scheduled for prior to 5th dose (due to pt's long half life) at 1900 on 9/18.  Pharmacy will continue to monitor and adjust dose accordingly.  Veronica Anderson 03/29/2015,11:01 AM

## 2015-03-29 NOTE — Care Management (Signed)
Patient transferred from icu.  Current 02 requirements are 2-3 liters.  She declined to work with physical therapy today.

## 2015-03-29 NOTE — Progress Notes (Signed)
Son at the bedside. Pt reports no pain. 3 L of oxygen. NSR. FS ares table. Takes meds ok. IV abx for bilat PNA. Refused to work with PT. Slept majority of shift. Incontinent. Pt has no further concerns at this time.

## 2015-03-30 LAB — CBC
HEMATOCRIT: 24.1 % — AB (ref 35.0–47.0)
HEMOGLOBIN: 7.2 g/dL — AB (ref 12.0–16.0)
MCH: 18.1 pg — ABNORMAL LOW (ref 26.0–34.0)
MCHC: 29.8 g/dL — ABNORMAL LOW (ref 32.0–36.0)
MCV: 60.7 fL — AB (ref 80.0–100.0)
Platelets: 241 10*3/uL (ref 150–440)
RBC: 3.97 MIL/uL (ref 3.80–5.20)
RDW: 22.7 % — AB (ref 11.5–14.5)
WBC: 10.9 10*3/uL (ref 3.6–11.0)

## 2015-03-30 LAB — BASIC METABOLIC PANEL
ANION GAP: 8 (ref 5–15)
BUN: 53 mg/dL — ABNORMAL HIGH (ref 6–20)
CHLORIDE: 100 mmol/L — AB (ref 101–111)
CO2: 32 mmol/L (ref 22–32)
Calcium: 8.4 mg/dL — ABNORMAL LOW (ref 8.9–10.3)
Creatinine, Ser: 2.02 mg/dL — ABNORMAL HIGH (ref 0.44–1.00)
GFR calc non Af Amer: 24 mL/min — ABNORMAL LOW (ref >60)
GFR, EST AFRICAN AMERICAN: 28 mL/min — AB (ref >60)
GLUCOSE: 171 mg/dL — AB (ref 65–99)
POTASSIUM: 3.7 mmol/L (ref 3.5–5.1)
Sodium: 140 mmol/L (ref 135–145)

## 2015-03-30 LAB — PROCALCITONIN: PROCALCITONIN: 3.94 ng/mL

## 2015-03-30 LAB — GLUCOSE, CAPILLARY
GLUCOSE-CAPILLARY: 137 mg/dL — AB (ref 65–99)
GLUCOSE-CAPILLARY: 159 mg/dL — AB (ref 65–99)
Glucose-Capillary: 185 mg/dL — ABNORMAL HIGH (ref 65–99)
Glucose-Capillary: 230 mg/dL — ABNORMAL HIGH (ref 65–99)

## 2015-03-30 LAB — LEGIONELLA PNEUMOPHILA SEROGP 1 UR AG: L. pneumophila Serogp 1 Ur Ag: NEGATIVE

## 2015-03-30 MED ORDER — INSULIN GLARGINE 100 UNIT/ML ~~LOC~~ SOLN
40.0000 [IU] | Freq: Every day | SUBCUTANEOUS | Status: DC
  Administered 2015-03-30: 40 [IU] via SUBCUTANEOUS
  Filled 2015-03-30 (×2): qty 0.4

## 2015-03-30 MED ORDER — FERROUS SULFATE 325 (65 FE) MG PO TABS
325.0000 mg | ORAL_TABLET | Freq: Two times a day (BID) | ORAL | Status: DC
  Administered 2015-03-30 – 2015-03-31 (×2): 325 mg via ORAL
  Filled 2015-03-30 (×2): qty 1

## 2015-03-30 NOTE — Care Management (Signed)
Patient is on chronic O2 at home provided by [employer].  Patient states that she used to only wear her O2 at night with her CPAP, however has been wearing in continuously lately.  Patient's son [name] at bedside.  [name] states that the patient has portable O2 tanks I home.  I informed patient and son that a portable tank would need to be brought in for discharge.  [name] agreed.  Patient will need resumption of home health and O2 orders at time of discharge.

## 2015-03-30 NOTE — Care Management (Signed)
Anticipate dc with home health.  Patient provided with home health preference list.  Patient selected [place].  Will need home health order and face to face

## 2015-03-30 NOTE — Evaluation (Signed)
Physical Therapy Evaluation Patient Details Name: Veronica Anderson MRN: [id number] DOB: 11/20/1945 Today's Date: 03/30/2015   History of Present Illness  presensted to ER with worsening cough, SOB requiring BiPAP in ED; admitted with respiratory failure related to bilat PNA.  Currently on 3L supplemental O2.  Clinical Impression  Upon evaluation, patient alert and oriented, follows all commands.  Demonstrates strength and ROM grossly WFL, though generally limited by body habitus.  Currently requiring min assist for bed mobility; cga/min assist for sit/stand, basic transfers and short-distance gait (30') with RW.  Slow, but fairly steady, gait performance without overt buckling or LOB.  Mod SOB with minimal activity with noted desat to 86% on 3L (requiring 60 seconds seated rest for recovery); BORG rating 5/10.  Additional activity deferred due to fatigue (and baseline mobility not much greater than today's performance). Son present throughout session and very supportive/encouraging to patient.  Voices comfort providing continued care upon discharge. Would benefit from skilled PT to address above deficits and promote optimal return to PLOF; Recommend transition to [place] upon discharge from acute hospitalization.     Follow Up Recommendations Home health PT    Equipment Recommendations       Recommendations for Other Services       Precautions / Restrictions Precautions Precautions: Fall Restrictions Weight Bearing Restrictions: No      Mobility  Bed Mobility Overal bed mobility: Needs Assistance Bed Mobility: Supine to Sit     Supine to sit: Min assist     General bed mobility comments: assist for truncal elevation; heavy use of bedrails  Transfers Overall transfer level: Needs assistance Equipment used: Rolling walker (2 wheeled) Transfers: Sit to/from Stand Sit to Stand: Min guard;Min assist         General transfer comment: cuing for hand  placement  Ambulation/Gait Ambulation/Gait assistance: Min guard;Min assist Ambulation Distance (Feet): 30 Feet Assistive device: Rolling walker (2 wheeled)       General Gait Details: broad BOS, mildly forward flexed posture. Slow, but reasonably steady, gait performance without buckling or LOB.  Broad turning radius.  Mod SOB with minimal activity with desat to 86% on 3L, requiring approx 60 seconds of seated rest for recovery to >92%.  Stairs            Wheelchair Mobility    Modified Rankin (Stroke Patients Only)       Balance Overall balance assessment: Needs assistance Sitting-balance support: No upper extremity supported;Feet supported Sitting balance-Leahy Scale: Good     Standing balance support: Bilateral upper extremity supported Standing balance-Leahy Scale: Fair                               Pertinent Vitals/Pain Pain Assessment: No/denies pain    Home Living Family/patient expects to be discharged to:: Private residence Living Arrangements: Children (2 sons and additional family members; able to coordinate 24 hour sup/assist as needed) Available Help at Discharge: Family Type of Home: House Home Access: Stairs to enter Entrance Stairs-Rails: Can reach both Entrance Stairs-Number of Steps: 2 Home Layout: Two level;Able to live on main level with bedroom/bathroom (no essential needs on upper level of home) Home Equipment: Walker - 2 wheels;Cane - single point      Prior Function Level of Independence: Needs assistance         Comments: intermittent assist from family as needed for ADLs, household mobility (limited to approx 25-50' at a time) and chores; denies  fall history.  On home O2 "as needed"     Hand Dominance        Extremity/Trunk Assessment   Upper Extremity Assessment: Overall WFL for tasks assessed           Lower Extremity Assessment: Overall WFL for tasks assessed         Communication   Communication:  No difficulties  Cognition Arousal/Alertness: Awake/alert Behavior During Therapy: WFL for tasks assessed/performed Overall Cognitive Status: Within Functional Limits for tasks assessed                      General Comments      Exercises        Assessment/Plan    PT Assessment Patient needs continued PT services  PT Diagnosis Difficulty walking;Generalized weakness   PT Problem List Decreased strength;Decreased activity tolerance;Decreased balance;Decreased mobility;Decreased safety awareness;Decreased knowledge of precautions;Cardiopulmonary status limiting activity;Obesity  PT Treatment Interventions DME instruction;Gait training;Stair training;Functional mobility training;Therapeutic activities;Therapeutic exercise;Balance training;Patient/family education   PT Goals (Current goals can be found in the Care Plan section) Acute Rehab PT Goals Patient Stated Goal: "to try to do a little" PT Goal Formulation: With patient/family Time For Goal Achievement: 04/13/15 Potential to Achieve Goals: Fair    Frequency Min 2X/week   Barriers to discharge        Co-evaluation               End of Session Equipment Utilized During Treatment: Gait belt Activity Tolerance: Patient tolerated treatment well;Patient limited by fatigue Patient left: in chair;with call bell/phone within reach;with family/visitor present (patient/son aware of need for nursing assist with retrun to bed; to call nursing as needed)           Time: [id number] PT Time Calculation (min) (ACUTE ONLY): 17 min   Charges:   PT Evaluation $Initial PT Evaluation Tier I: 1 Procedure     PT G Codes:        Kristen H. Owens Shark, PT, DPT, NCS 03/30/2015, 12:02 PM [phone]

## 2015-03-30 NOTE — Progress Notes (Signed)
PCT now coming down The positive BC appears to be a contaminant (1/2 coag neg staph)  Vanc has been stopped The leukocytosis is improving She continues to slowly improve  PCCM will sign off. Please call if we can be of further assistance  Merton Border, MD PCCM service Mobile [phone] Pager [phone]

## 2015-03-30 NOTE — Progress Notes (Signed)
ANTIBIOTIC CONSULT NOTE - Follow Up  Pharmacy Consult for Levaquin Indication: pneumonia  Allergies  Allergen Reactions  . Ace Inhibitors Cough    Patient Measurements: Height: 5\' 6"  (167.6 cm) Weight: (!) 307 lb 12.8 oz (139.617 kg) IBW/kg (Calculated) : 59.3  Vital Signs: Temp: 99.5 F (37.5 C) (09/16 1606) Temp Source: Oral (09/16 1606) BP: 145/48 mmHg (09/16 1606) Pulse Rate: 76 (09/16 1606) Intake/Output from previous day: 09/15 0701 - 09/16 0700 In: 840 [P.O.:240; IV Piggyback:600] Out: -  Intake/Output from this shift: Total I/O In: 240 [P.O.:240] Out: -   Labs:  Recent Labs  03/28/15 0540 03/29/15 0424 03/29/15 0722 03/30/15 0407  WBC 10.3  --  15.1* 10.9  HGB 7.5*  --  7.1* 7.2*  PLT 184  --  231 241  CREATININE 2.04* 2.11*  --  2.02*   Estimated Creatinine Clearance: 37.9 mL/min (by C-G formula based on Cr of 2.02). No results for input(s): VANCOTROUGH, VANCOPEAK, VANCORANDOM, GENTTROUGH, GENTPEAK, GENTRANDOM, TOBRATROUGH, TOBRAPEAK, TOBRARND, AMIKACINPEAK, AMIKACINTROU, AMIKACIN in the last 72 hours.   Microbiology: Recent Results (from the past 720 hour(s))  Blood culture (routine x 2)     Status: None (Preliminary result)   Collection Time: 03/27/15  6:37 PM  Result Value Ref Range Status   Specimen Description BLOOD RIGHT HAND  Final   Special Requests BOTTLES DRAWN AEROBIC AND ANAEROBIC  1CC  Final   Culture NO GROWTH 3 DAYS  Final   Report Status PENDING  Incomplete  Blood culture (routine x 2)     Status: None (Preliminary result)   Collection Time: 03/27/15  6:54 PM  Result Value Ref Range Status   Specimen Description BLOOD RIGHT ASSIST CONTROL  Final   Special Requests   Final    BOTTLES DRAWN AEROBIC AND ANAEROBIC  10CC AEROBIC, 5CC ANAEROBIC   Culture  Setup Time   Final    GRAM POSITIVE COCCI IN CLUSTERS AEROBIC BOTTLE ONLY CRITICAL RESULT CALLED TO, READ BACK BY AND VERIFIED WITH: DELL HOPKINS 03/29/2015 0110 [place] VERIFIED BY  AJO    Culture   Final    COAGULASE NEGATIVE STAPHYLOCOCCUS AEROBIC BOTTLE ONLY Results consistent with contamination.    Report Status PENDING  Incomplete  MRSA PCR Screening     Status: None   Collection Time: 03/28/15 10:45 AM  Result Value Ref Range Status   MRSA by PCR NEGATIVE NEGATIVE Final    Comment:        The GeneXpert MRSA Assay (FDA approved for NASAL specimens only), is one component of a comprehensive MRSA colonization surveillance program. It is not intended to diagnose MRSA infection nor to guide or monitor treatment for MRSA infections.     Medical History: Past Medical History  Diagnosis Date  . GERD (gastroesophageal reflux disease)   . Left ankle pain   . Requires supplemental oxygen   . Obesity   . Vitamin D deficiency   . Chronic kidney disease     stage IV (severe), Dr. Aleene Davidson  . Diabetes mellitus without complication   . Anemia   . Hyperlipidemia   . Hypertension   . CHF (NYHA class II, ACC/AHA stage C)   . Allergy   . Constipation   . Lumbago   . Asthma   . Protein calorie malnutrition   . OA (osteoarthritis)   . Obstructive sleep apnea syndrome   . Paresthesia     Foot  . Cataract     bilateral    Medications:  Scheduled:  . amLODipine  10 mg Oral Daily  . aspirin EC  81 mg Oral QHS  . cloNIDine  0.3 mg Transdermal Weekly  . enoxaparin (LOVENOX) injection  40 mg Subcutaneous Q12H  . ferrous sulfate  325 mg Oral BID WC  . furosemide  40 mg Oral BID  . gabapentin  300 mg Oral TID  . hydrALAZINE  100 mg Oral TID  . insulin aspart  0-20 Units Subcutaneous TID WC  . insulin aspart  0-5 Units Subcutaneous QHS  . insulin aspart  4 Units Subcutaneous TID WC  . insulin glargine  40 Units Subcutaneous QHS  . ipratropium-albuterol  3 mL Nebulization Q6H  . levofloxacin (LEVAQUIN) IV  250 mg Intravenous Q24H  . metoprolol  25 mg Oral BID  . nystatin   Topical TID  . pantoprazole  40 mg Oral Daily  . pravastatin  40 mg Oral QHS  .  Vitamin D (Ergocalciferol)  50,000 Units Oral Q7 days   Infusions:    PRN: acetaminophen **OR** acetaminophen, loratadine, ondansetron **OR** ondansetron (ZOFRAN) IV  Assessment: 69 y/o F admitted with respiratory failure due to B/L PNA.   Est CrCl~37.9 mL/min  Plan:  Will  Continue renally adjust dosing of 250 mg daily and continue to follow renal function.  Follow up culture results  Scarpena,Crystal G 03/30/2015,4:10 PM

## 2015-03-30 NOTE — Progress Notes (Signed)
[facility] at [place] NAME: Veronica Anderson    MR#:  [id number]  DATE OF BIRTH:  [birth year]  SUBJECTIVE:  CHIEF COMPLAINT:   Chief Complaint  Patient presents with  . Shortness of Breath  Slowly getting btter. Dry cough. Son at bedside. Started using home O2 a week prior to admission. Uses CPAP at night.  REVIEW OF SYSTEMS:    Review of Systems  Constitutional: Positive for malaise/fatigue. Negative for fever and chills.  HENT: Negative for sore throat.   Eyes: Negative for blurred vision, double vision and pain.  Respiratory: Positive for cough, shortness of breath and wheezing. Negative for hemoptysis.   Cardiovascular: Positive for orthopnea. Negative for chest pain, palpitations and leg swelling.  Gastrointestinal: Negative for heartburn, nausea, vomiting, abdominal pain, diarrhea and constipation.  Genitourinary: Negative for dysuria and hematuria.  Musculoskeletal: Positive for back pain. Negative for joint pain.  Skin: Negative for rash.  Neurological: Positive for weakness. Negative for sensory change, speech change, focal weakness and headaches.  Endo/Heme/Allergies: Does not bruise/bleed easily.  Psychiatric/Behavioral: Negative for depression. The patient is not nervous/anxious.       DRUG ALLERGIES:   Allergies  Allergen Reactions  . Ace Inhibitors Cough    VITALS:  Blood pressure 130/70, pulse 70, temperature 98.2 F (36.8 C), temperature source Oral, resp. rate 22, height 5\' 6"  (1.676 m), weight 139.617 kg (307 lb 12.8 oz), SpO2 98 %.  PHYSICAL EXAMINATION:   Physical Exam  GENERAL:  69 y.o.-year-old patient lying in the bed with no acute distress. Morbidly obese EYES: Pupils equal, round, reactive to light and accommodation. No scleral icterus. Extraocular muscles intact.  HEENT: Head atraumatic, normocephalic. Oropharynx and nasopharynx clear.  NECK:  Supple, no jugular venous distention. No thyroid  enlargement, no tenderness.  LUNGS: Good air entry bilaterally CARDIOVASCULAR: S1, S2 normal. No murmurs, rubs, or gallops.  ABDOMEN: Soft, nontender, nondistended. Bowel sounds present. No organomegaly or mass.  EXTREMITIES: No cyanosis, clubbing or edema b/l.    NEUROLOGIC: Cranial nerves II through XII are intact. No focal Motor or sensory deficits b/l.   PSYCHIATRIC: The patient is alert and oriented x 3.  SKIN: No obvious rash, lesion, or ulcer.    LABORATORY PANEL:   CBC  Recent Labs Lab 03/30/15 0407  WBC 10.9  HGB 7.2*  HCT 24.1*  PLT 241   ------------------------------------------------------------------------------------------------------------------  Chemistries   Recent Labs Lab 03/30/15 0407  NA 140  K 3.7  CL 100*  CO2 32  GLUCOSE 171*  BUN 53*  CREATININE 2.02*  CALCIUM 8.4*   ------------------------------------------------------------------------------------------------------------------  Cardiac Enzymes  Recent Labs Lab 03/27/15 1710  TROPONINI 0.06*   ------------------------------------------------------------------------------------------------------------------  RADIOLOGY:  Dg Chest Port 1 View  03/29/2015   CLINICAL DATA:  Respiratory failure  EXAM: PORTABLE CHEST - 1 VIEW  COMPARISON:  Portable chest x-ray of 03/27/2015  FINDINGS: There is little change to perhaps very minimal improvement in patchy airspace disease diffusely. Cardiomegaly is stable.  IMPRESSION: Little change to slight improvement in patchy airspace disease diffusely.   Electronically Signed   By: Ivar Drape M.D.   On: 03/29/2015 08:01     ASSESSMENT AND PLAN:   Patient is a 69 year old African-American female admitted for acute respiratory failure  1. Acute hypoxic and hypercapnic respiratory failure due to bilateral pneumonia On IV antibiotics. Cultures negative. Nebulizers Wean O2. Bipap PRN and at night.  2. Diabetes type 2: Lantus, sliding scale  3.  Hypertension  continue Norvasc and clonidine  4. CKD stage IV  Creatinine is stable around 2. Continue Lasix from home and monitor.  5. Sleep apnea patient currently on the BiPAP changed to CPAP once off BiPAP  6. Chronic diastolic congestive heart failure No edema or JVD. Patient will be on her home dose of oral Lasix.  7. Anemia of chronic disease due to CKD4. >7 No Need for transfusion. Low iron levels. Start supplements. Will need OP w/u.   All the records are reviewed and case discussed with Care Management/Social Workerr. Management plans discussed with the patient, family and they are in agreement.  CODE STATUS: FULL  DVT Prophylaxis: SCDs  TOTAL TIME TAKING CARE OF THIS PATIENT: 35 minutes.   Likely d/c tomorrow   Hillary Bow R M.D on 03/30/2015 at 11:46 AM  Between 7am to 6pm - Pager - [phone]  After 6pm go to [email or web address] - password [email or web address] [facility]  Office  [phone]  CC: Primary care physician; Loistine Chance, MD

## 2015-03-30 NOTE — Progress Notes (Signed)
Alert and oriented. Sinus Rhythm 60's on telemetry. Rested quietly most of the day. No complaints. Son at bedside today. Full assessment as charted. Worked with PT today.

## 2015-03-30 NOTE — Care Management Important Message (Signed)
Important Message  Patient Details  Name: Veronica Anderson MRN: [id number] Date of Birth: 01/19/1946   Medicare Important Message Given:  Yes-second notification given    Darius Bump Allmond 03/30/2015, 10:44 AM

## 2015-03-31 DIAGNOSIS — M545 Low back pain: Secondary | ICD-10-CM | POA: Diagnosis not present

## 2015-03-31 LAB — GLUCOSE, CAPILLARY
GLUCOSE-CAPILLARY: 203 mg/dL — AB (ref 65–99)
Glucose-Capillary: 170 mg/dL — ABNORMAL HIGH (ref 65–99)

## 2015-03-31 MED ORDER — LEVOFLOXACIN 250 MG PO TABS: 250.0000 mg | ORAL_TABLET | Freq: Every day | ORAL | Status: DC

## 2015-03-31 MED ORDER — NYSTATIN 100000 UNIT/GM EX POWD: 1.0000 | Freq: Three times a day (TID) | CUTANEOUS | Status: DC

## 2015-03-31 MED ORDER — FERROUS SULFATE 325 (65 FE) MG PO TABS: 325.0000 mg | ORAL_TABLET | Freq: Two times a day (BID) | ORAL | Status: DC

## 2015-03-31 MED ORDER — IPRATROPIUM-ALBUTEROL 0.5-2.5 (3) MG/3ML IN SOLN: 3.0000 mL | Freq: Four times a day (QID) | RESPIRATORY_TRACT | Status: DC | PRN

## 2015-03-31 NOTE — Progress Notes (Signed)
Patient discharged to home with home health. Instructions given, son present for teaching. Printed/Electronic prescriptions sent. IV and tele removed at discharge. No more needs from care management and/or social work. Patient stable, on 2L. Earleen Reaper, RN

## 2015-03-31 NOTE — Discharge Summary (Signed)
[facility] at [place] NAME: Veronica Anderson    MR#:  [id number]  DATE OF BIRTH:  [birth year]  DATE OF ADMISSION:  03/27/2015 ADMITTING PHYSICIAN: Dustin Flock, MD  DATE OF DISCHARGE: 03/31/2015 PRIMARY CARE PHYSICIAN: Loistine Chance, MD    ADMISSION DIAGNOSIS:  Shortness of breath [R06.02] Community acquired pneumonia [J18.9]  DISCHARGE DIAGNOSIS:  Active Problems:   Acute respiratory failure   SECONDARY DIAGNOSIS:   Past Medical History  Diagnosis Date  . GERD (gastroesophageal reflux disease)   . Left ankle pain   . Requires supplemental oxygen   . Obesity   . Vitamin D deficiency   . Chronic kidney disease     stage IV (severe), Dr. Aleene Davidson  . Diabetes mellitus without complication   . Anemia   . Hyperlipidemia   . Hypertension   . CHF (NYHA class II, ACC/AHA stage C)   . Allergy   . Constipation   . Lumbago   . Asthma   . Protein calorie malnutrition   . OA (osteoarthritis)   . Obstructive sleep apnea syndrome   . Paresthesia     Foot  . Cataract     bilateral    HOSPITAL COURSE:  69 year old female with chronic kidney disease stage 4, diabetes and OSA who presented with acute respiratory failure on chronic respiratory failure secondary to bilateral pneumonia. For further details please refer the H&P.   1. Acute hypoxic and hypercapnic procedure failure on chronic respiratory failure: This was due to bilateral pneumonia along with acute on chronic diastolic heart failure. .Patient does wear 2 L oxygen at home. Patient's chest x-ray was consistent with bilateral pneumonia. Patient was placed on broad-spectrum antibiotics. Pulmonary was also consulted due to the amount of respiratory distress. Patient was continued on diuresis and supplemental oxygen along with antibiotics.  2. Bilateral pneumonia: Patient was placed on Levaquin. She did well with this. Levaquin was renally dosed.  3. Acute on chronic diastolic  heart failure: Patient was diuresis and is now back at her baseline oxygen.  4. Sepsis: Patient presented with leukocytosis elevated lactic acid and pneumonia along with fever. Sepsis secondary to bilateral pneumonia and has improved.  5. OSA: Patient will continue on her BiPAP machine.  6. Essential hypertension: Continue metoprolol.  7. Diabetes patient will resume her outpatient medications and ADA diet.  8. Hyperlipidemia: Patient continue statin medication.   DISCHARGE CONDITIONS AND DIET:  She is being discharged home in stable condition on a diabetic/heart healthy diet. Patient will be discharged with home health care.  CONSULTS OBTAINED:  Treatment Team:  Dustin Flock, MD  DRUG ALLERGIES:   Allergies  Allergen Reactions  . Ace Inhibitors Cough    DISCHARGE MEDICATIONS:   Current Discharge Medication List    START taking these medications   Details  ferrous sulfate 325 (65 FE) MG tablet Take 1 tablet (325 mg total) by mouth 2 (two) times daily with a meal. Qty: 60 tablet, Refills: 3    levofloxacin (LEVAQUIN) 250 MG tablet Take 1 tablet (250 mg total) by mouth daily. Qty: 5 tablet, Refills: 0    nystatin (MYCOSTATIN/NYSTOP) 100000 UNIT/GM POWD Apply 1 Bottle topically 3 (three) times daily. Qty: 1 Bottle, Refills: 0      CONTINUE these medications which have NOT CHANGED   Details  amLODipine (NORVASC) 10 MG tablet Take 10 mg by mouth daily.    aspirin EC 81 MG tablet Take 81 mg by mouth at bedtime.  cloNIDine (CATAPRES-TTS-3) 0.3 mg/24hr patch Place 1 patch (0.3 mg total) onto the skin once a week. Qty: 4 patch, Refills: 12    furosemide (LASIX) 20 MG tablet Take 20 mg by mouth 2 (two) times daily.    gabapentin (NEURONTIN) 300 MG capsule Take 300 mg by mouth 3 (three) times daily.    hydrALAZINE (APRESOLINE) 50 MG tablet Take 1 tablet (50 mg total) by mouth 3 (three) times daily. Qty: 90 tablet, Refills: 5    insulin glargine (LANTUS) 100 UNIT/ML  injection Inject 42 Units into the skin at bedtime.    insulin lispro (HUMALOG) 100 UNIT/ML injection Inject 5 Units into the skin 3 (three) times daily with meals.    loratadine (CLARITIN) 10 MG tablet Take 10 mg by mouth daily as needed for allergies.    metoprolol (LOPRESSOR) 100 MG tablet Take 100 mg by mouth 2 (two) times daily.    montelukast (SINGULAIR) 10 MG tablet Take 10 mg by mouth at bedtime.    omeprazole (PRILOSEC) 40 MG capsule Take 40 mg by mouth daily.    pravastatin (PRAVACHOL) 40 MG tablet Take 40 mg by mouth at bedtime.    Vitamin D, Ergocalciferol, (DRISDOL) 50000 UNITS CAPS capsule Take 50,000 Units by mouth every 7 (seven) days. Pt takes on Sunday.              Today   CHIEF COMPLAINT:  Patient is doing well this morning. Patient reports no increased shortness of breath or chest pain. Patient is at her baseline.   VITAL SIGNS:  Blood pressure 130/53, pulse 77, temperature 98.2 F (36.8 C), temperature source Oral, resp. rate 18, height 5\' 6"  (1.676 m), weight 139.617 kg (307 lb 12.8 oz), SpO2 98 %.   REVIEW OF SYSTEMS:  Review of Systems  Constitutional: Negative for fever, chills and malaise/fatigue.  HENT: Negative for sore throat.   Eyes: Negative for blurred vision.  Respiratory: Negative for cough, hemoptysis, shortness of breath and wheezing.   Cardiovascular: Negative for chest pain, palpitations and leg swelling.  Gastrointestinal: Negative for nausea, vomiting, abdominal pain, diarrhea and blood in stool.  Genitourinary: Negative for dysuria.  Musculoskeletal: Negative for back pain.  Neurological: Negative for dizziness, tremors and headaches.  Endo/Heme/Allergies: Does not bruise/bleed easily.     PHYSICAL EXAMINATION:  GENERAL:  69 y.o.-year-old morbidly obese patient lying in the bed with no acute distress.  NECK:  Supple, no jugular venous distention. No thyroid enlargement, no tenderness.  LUNGS: Normal breath sounds  bilaterally, no wheezing, rales,rhonchi  No use of accessory muscles of respiration.  CARDIOVASCULAR: S1, S2 normal. No murmurs, rubs, or gallops.  ABDOMEN: Soft, non-tender, non-distended. Bowel sounds present. No organomegaly or mass.  EXTREMITIES: No pedal edema, cyanosis, or clubbing.  PSYCHIATRIC: The patient is alert and oriented x 3.  SKIN: No obvious rash, lesion, or ulcer.   DATA REVIEW:   CBC  Recent Labs Lab 03/30/15 0407  WBC 10.9  HGB 7.2*  HCT 24.1*  PLT 241    Chemistries   Recent Labs Lab 03/30/15 0407  NA 140  K 3.7  CL 100*  CO2 32  GLUCOSE 171*  BUN 53*  CREATININE 2.02*  CALCIUM 8.4*    Cardiac Enzymes  Recent Labs Lab 03/27/15 1710  TROPONINI 0.06*    Microbiology Results  @[email or web address] @  RADIOLOGY:  No results found.    Management plans discussed with the patient and she is in agreement. Stable for discharge home with home health care  Patient should follow up with PCP in one week and pulmonary in 1 week  CODE STATUS:     Code Status Orders        Start     Ordered   03/27/15 1939  Full code   Continuous     03/27/15 1941      TOTAL TIME TAKING CARE OF THIS PATIENT: 35 minutes.    MODY, SITAL M.D on 03/31/2015 at 11:17 AM  Between 7am to 6pm - Pager - [phone] After 6pm go to [email or web address] - password [email or web address] [facility]  Office  [phone]  CC: Primary care physician; Loistine Chance, MD

## 2015-03-31 NOTE — Progress Notes (Signed)
Spoke with Lenora Boys, Care Management, regarding discharge plan for home health. She is working on it and will get back to me. Patient already has home O2 and portable tanks.

## 2015-03-31 NOTE — Care Management Note (Signed)
Case Management Note  Patient Details  Name: Veronica Anderson MRN: [id number] Date of Birth: 1946-01-19  Subjective/Objective:         Has home oxygen and a portable tank. A referral was faxed to [facility] requesting home health PT and RN.            Action/Plan:   Expected Discharge Date:                  Expected Discharge Plan:  [place]  In-House Referral:     Discharge planning Services  CM Consult  Post Acute Care Choice:    Choice offered to:  Adult Children  DME Arranged:    DME Agency:     HH Arranged:    HH Agency:     Status of Service:  In process, will continue to follow  Medicare Important Message Given:  Yes-second notification given Date Medicare IM Given:    Medicare IM give by:    Date Additional Medicare IM Given:    Additional Medicare Important Message give by:     If discussed at [place] of Stay Meetings, dates discussed:    Additional Comments:  Rockett,Marilyn A, RN 03/31/2015, 12:26 PM

## 2015-03-31 NOTE — Progress Notes (Signed)
Spoke with care management, no further needs. Will proceed with discharge. Earleen Reaper, RN

## 2015-04-01 DIAGNOSIS — M545 Low back pain: Secondary | ICD-10-CM | POA: Diagnosis not present

## 2015-04-01 LAB — CULTURE, BLOOD (ROUTINE X 2): Culture: NO GROWTH

## 2015-04-02 DIAGNOSIS — I509 Heart failure, unspecified: Secondary | ICD-10-CM | POA: Diagnosis not present

## 2015-04-02 DIAGNOSIS — I129 Hypertensive chronic kidney disease with stage 1 through stage 4 chronic kidney disease, or unspecified chronic kidney disease: Secondary | ICD-10-CM | POA: Diagnosis not present

## 2015-04-02 DIAGNOSIS — N184 Chronic kidney disease, stage 4 (severe): Secondary | ICD-10-CM | POA: Diagnosis not present

## 2015-04-02 DIAGNOSIS — J189 Pneumonia, unspecified organism: Secondary | ICD-10-CM | POA: Diagnosis not present

## 2015-04-02 DIAGNOSIS — E1122 Type 2 diabetes mellitus with diabetic chronic kidney disease: Secondary | ICD-10-CM | POA: Diagnosis not present

## 2015-04-02 DIAGNOSIS — M545 Low back pain: Secondary | ICD-10-CM | POA: Diagnosis not present

## 2015-04-02 LAB — CULTURE, BLOOD (ROUTINE X 2)

## 2015-04-03 DIAGNOSIS — M545 Low back pain: Secondary | ICD-10-CM | POA: Diagnosis not present

## 2015-04-04 ENCOUNTER — Ambulatory Visit: Payer: Commercial Managed Care - HMO | Admitting: Family Medicine

## 2015-04-04 DIAGNOSIS — M545 Low back pain: Secondary | ICD-10-CM | POA: Diagnosis not present

## 2015-04-05 DIAGNOSIS — I129 Hypertensive chronic kidney disease with stage 1 through stage 4 chronic kidney disease, or unspecified chronic kidney disease: Secondary | ICD-10-CM | POA: Diagnosis not present

## 2015-04-05 DIAGNOSIS — J189 Pneumonia, unspecified organism: Secondary | ICD-10-CM | POA: Diagnosis not present

## 2015-04-05 DIAGNOSIS — I509 Heart failure, unspecified: Secondary | ICD-10-CM | POA: Diagnosis not present

## 2015-04-05 DIAGNOSIS — M545 Low back pain: Secondary | ICD-10-CM | POA: Diagnosis not present

## 2015-04-05 DIAGNOSIS — E1122 Type 2 diabetes mellitus with diabetic chronic kidney disease: Secondary | ICD-10-CM | POA: Diagnosis not present

## 2015-04-05 DIAGNOSIS — N184 Chronic kidney disease, stage 4 (severe): Secondary | ICD-10-CM | POA: Diagnosis not present

## 2015-04-06 DIAGNOSIS — M545 Low back pain: Secondary | ICD-10-CM | POA: Diagnosis not present

## 2015-04-07 DIAGNOSIS — M545 Low back pain: Secondary | ICD-10-CM | POA: Diagnosis not present

## 2015-04-08 DIAGNOSIS — M545 Low back pain: Secondary | ICD-10-CM | POA: Diagnosis not present

## 2015-04-09 DIAGNOSIS — M545 Low back pain: Secondary | ICD-10-CM | POA: Diagnosis not present

## 2015-04-10 DIAGNOSIS — M545 Low back pain: Secondary | ICD-10-CM | POA: Diagnosis not present

## 2015-04-11 DIAGNOSIS — M545 Low back pain: Secondary | ICD-10-CM | POA: Diagnosis not present

## 2015-04-11 DIAGNOSIS — E1122 Type 2 diabetes mellitus with diabetic chronic kidney disease: Secondary | ICD-10-CM | POA: Diagnosis not present

## 2015-04-11 DIAGNOSIS — J189 Pneumonia, unspecified organism: Secondary | ICD-10-CM | POA: Diagnosis not present

## 2015-04-11 DIAGNOSIS — I129 Hypertensive chronic kidney disease with stage 1 through stage 4 chronic kidney disease, or unspecified chronic kidney disease: Secondary | ICD-10-CM | POA: Diagnosis not present

## 2015-04-11 DIAGNOSIS — N184 Chronic kidney disease, stage 4 (severe): Secondary | ICD-10-CM | POA: Diagnosis not present

## 2015-04-11 DIAGNOSIS — I509 Heart failure, unspecified: Secondary | ICD-10-CM | POA: Diagnosis not present

## 2015-04-12 DIAGNOSIS — M545 Low back pain: Secondary | ICD-10-CM | POA: Diagnosis not present

## 2015-04-13 DIAGNOSIS — M545 Low back pain: Secondary | ICD-10-CM | POA: Diagnosis not present

## 2015-04-14 ENCOUNTER — Other Ambulatory Visit: Payer: Self-pay | Admitting: Family Medicine

## 2015-04-14 DIAGNOSIS — M545 Low back pain: Secondary | ICD-10-CM | POA: Diagnosis not present

## 2015-04-15 DIAGNOSIS — M545 Low back pain: Secondary | ICD-10-CM | POA: Diagnosis not present

## 2015-04-16 DIAGNOSIS — M545 Low back pain: Secondary | ICD-10-CM | POA: Diagnosis not present

## 2015-04-17 DIAGNOSIS — M545 Low back pain: Secondary | ICD-10-CM | POA: Diagnosis not present

## 2015-04-18 ENCOUNTER — Encounter: Payer: Self-pay | Admitting: *Deleted

## 2015-04-18 ENCOUNTER — Inpatient Hospital Stay: Payer: Self-pay | Admitting: Pulmonary Disease

## 2015-04-18 DIAGNOSIS — M545 Low back pain: Secondary | ICD-10-CM | POA: Diagnosis not present

## 2015-04-19 DIAGNOSIS — M545 Low back pain: Secondary | ICD-10-CM | POA: Diagnosis not present

## 2015-04-20 DIAGNOSIS — M545 Low back pain: Secondary | ICD-10-CM | POA: Diagnosis not present

## 2015-04-21 DIAGNOSIS — M545 Low back pain: Secondary | ICD-10-CM | POA: Diagnosis not present

## 2015-04-22 ENCOUNTER — Other Ambulatory Visit: Payer: Self-pay | Admitting: Family Medicine

## 2015-04-22 DIAGNOSIS — M545 Low back pain: Secondary | ICD-10-CM | POA: Diagnosis not present

## 2015-04-23 DIAGNOSIS — I129 Hypertensive chronic kidney disease with stage 1 through stage 4 chronic kidney disease, or unspecified chronic kidney disease: Secondary | ICD-10-CM | POA: Diagnosis not present

## 2015-04-23 DIAGNOSIS — I509 Heart failure, unspecified: Secondary | ICD-10-CM | POA: Diagnosis not present

## 2015-04-23 DIAGNOSIS — E1122 Type 2 diabetes mellitus with diabetic chronic kidney disease: Secondary | ICD-10-CM | POA: Diagnosis not present

## 2015-04-23 DIAGNOSIS — N184 Chronic kidney disease, stage 4 (severe): Secondary | ICD-10-CM | POA: Diagnosis not present

## 2015-04-23 DIAGNOSIS — J189 Pneumonia, unspecified organism: Secondary | ICD-10-CM | POA: Diagnosis not present

## 2015-04-23 DIAGNOSIS — M545 Low back pain: Secondary | ICD-10-CM | POA: Diagnosis not present

## 2015-04-24 DIAGNOSIS — M545 Low back pain: Secondary | ICD-10-CM | POA: Diagnosis not present

## 2015-04-25 DIAGNOSIS — M545 Low back pain: Secondary | ICD-10-CM | POA: Diagnosis not present

## 2015-04-26 ENCOUNTER — Ambulatory Visit (INDEPENDENT_AMBULATORY_CARE_PROVIDER_SITE_OTHER): Payer: Commercial Managed Care - HMO | Admitting: Family Medicine

## 2015-04-26 ENCOUNTER — Encounter: Payer: Self-pay | Admitting: Family Medicine

## 2015-04-26 VITALS — BP 142/84 | HR 56 | Temp 97.9°F | Resp 16 | Ht 64.0 in | Wt 301.1 lb

## 2015-04-26 DIAGNOSIS — N184 Chronic kidney disease, stage 4 (severe): Secondary | ICD-10-CM

## 2015-04-26 DIAGNOSIS — J9602 Acute respiratory failure with hypercapnia: Secondary | ICD-10-CM | POA: Diagnosis not present

## 2015-04-26 DIAGNOSIS — J9601 Acute respiratory failure with hypoxia: Secondary | ICD-10-CM

## 2015-04-26 DIAGNOSIS — J189 Pneumonia, unspecified organism: Secondary | ICD-10-CM | POA: Diagnosis not present

## 2015-04-26 DIAGNOSIS — Z9981 Dependence on supplemental oxygen: Secondary | ICD-10-CM | POA: Diagnosis not present

## 2015-04-26 DIAGNOSIS — M545 Low back pain: Secondary | ICD-10-CM | POA: Diagnosis not present

## 2015-04-26 DIAGNOSIS — I5030 Unspecified diastolic (congestive) heart failure: Secondary | ICD-10-CM

## 2015-04-26 NOTE — Progress Notes (Signed)
Name: Veronica Anderson   MRN: [id number]    DOB: [birth year]   Date:04/26/2015       Progress Note  Subjective  Chief Complaint  Chief Complaint  Patient presents with  . Hospitalization Follow-up    pneumonia and staph infection    HPI  Hospital follow up: patient developed acute onset of cough, SOB and confusion one month ago, transported to [facility] by EMS, diagnosed with hypoxemia, CAP, acute on chronic CHF, and sepsis. She was treated with antibiotics and discharge home after 5 days, she went home still feeling very weak - could not get up by herself, and needed 24 hours assistance from her son [name] ). She missed follow up with me and with pulmonologist because of her son's job -requiring him to go back.  She is feeling much better now, only using oxygen as needed, and came in today without it. No chest pain, no cough, no SOB, no fever, appetite is back to normal.    CKI: GFR dropped during hospital stay and we will recheck levels today  Patient Active Problem List   Diagnosis Date Noted  . Anemia in chronic illness 02/23/2015  . Asthma, mild intermittent 02/23/2015  . Benign essential HTN 02/23/2015  . Diastolic heart failure, NYHA class 3 ([place]) 02/23/2015  . Chronic kidney disease (CKD), stage IV (severe) ([place]) 02/23/2015  . CN (constipation) 02/23/2015  . Diabetes mellitus with neuropathy ([place]) 02/23/2015  . Dyslipidemia 02/23/2015  . Elevated ferritin 02/23/2015  . Gastro-esophageal reflux disease without esophagitis 02/23/2015  . LBP (low back pain) 02/23/2015  . Extreme obesity ([place]) 02/23/2015  . Obstructive apnea 02/23/2015  . Osteoarthritis 02/23/2015  . Neuropathy ([place]) 02/23/2015  . Perennial allergic rhinitis with seasonal variation 02/23/2015  . History of pneumonia 02/23/2015  . Dependence on supplemental oxygen 02/23/2015  . Vitamin D deficiency 02/23/2015  . Cataract 04/16/2010  . Background diabetic retinopathy ([place]) 12/18/2009    Past Surgical History   Procedure Laterality Date  . Ankle surgery Left     Family History  Problem Relation Age of Onset  . Diabetes type II      Social History   Social History  . Marital Status: Single    Spouse Name: N/A  . Number of Children: N/A  . Years of Education: N/A   Occupational History  . Not on file.   Social History Main Topics  . Smoking status: Former Smoker    Types: Cigarettes    Quit date: 07/14/1994  . Smokeless tobacco: Not on file  . Alcohol Use: No  . Drug Use: No  . Sexual Activity: Not Currently   Other Topics Concern  . Not on file   Social History Narrative     Current outpatient prescriptions:  .  amLODipine (NORVASC) 10 MG tablet, Take 10 mg by mouth daily., Disp: , Rfl:  .  aspirin EC 81 MG tablet, Take 81 mg by mouth at bedtime., Disp: , Rfl:  .  cloNIDine (CATAPRES-TTS-3) 0.3 mg/24hr patch, Place 1 patch (0.3 mg total) onto the skin once a week. (Patient taking differently: Place 0.3 mg onto the skin once a week. Pt applies patch on Sunday.), Disp: 4 patch, Rfl: 12 .  ferrous sulfate 325 (65 FE) MG tablet, Take 1 tablet (325 mg total) by mouth 2 (two) times daily with a meal., Disp: 60 tablet, Rfl: 3 .  furosemide (LASIX) 20 MG tablet, TAKE 1 TABLET BY MOUTH TWICE A DAY, Disp: 60 tablet, Rfl: 5 .  gabapentin (NEURONTIN) 300 MG capsule, Take 300 mg by mouth 3 (three) times daily., Disp: , Rfl:  .  hydrALAZINE (APRESOLINE) 50 MG tablet, Take 1 tablet (50 mg total) by mouth 3 (three) times daily., Disp: 90 tablet, Rfl: 5 .  insulin glargine (LANTUS) 100 UNIT/ML injection, Inject 42 Units into the skin at bedtime., Disp: , Rfl:  .  insulin lispro (HUMALOG) 100 UNIT/ML injection, Inject 5 Units into the skin 3 (three) times daily with meals., Disp: , Rfl:  .  loratadine (CLARITIN) 10 MG tablet, Take 10 mg by mouth daily as needed for allergies., Disp: , Rfl:  .  metoprolol (LOPRESSOR) 100 MG tablet, TAKE 1 TABLET BY MOUTH TWICE A DAY, Disp: 60 tablet, Rfl: 5 .   nystatin (MYCOSTATIN/NYSTOP) 100000 UNIT/GM POWD, Apply 1 Bottle topically 3 (three) times daily., Disp: 1 Bottle, Rfl: 0 .  omeprazole (PRILOSEC) 40 MG capsule, Take 40 mg by mouth daily., Disp: , Rfl:  .  pravastatin (PRAVACHOL) 40 MG tablet, Take 40 mg by mouth at bedtime., Disp: , Rfl:  .  Vitamin D, Ergocalciferol, (DRISDOL) 50000 UNITS CAPS capsule, TAKE ONE CAPSULE BY MOUTH ONCE A WEEK, Disp: 4 capsule, Rfl: 4  Allergies  Allergen Reactions  . Ace Inhibitors Cough     ROS  Constitutional: Negative for fever or significant weight change.  Respiratory: Negative for cough and shortness of breath.   Cardiovascular: Negative for chest pain or palpitations.  Gastrointestinal: Negative for abdominal pain, no bowel changes.  Musculoskeletal: Positive for gait problem - using wheelchair - here and at home a walker or cane or joint swelling.  Skin: Rash on face from CPAP mask  Neurological: Negative for dizziness or headache.  No other specific complaints in a complete review of systems (except as listed in HPI above).  Objective  Filed Vitals:   04/26/15 1003  BP: 146/82  Pulse: 56  Temp: 97.9 F (36.6 C)  TempSrc: Oral  Resp: 16  SpO2: 97%    There is no weight on file to calculate BMI.  Physical Exam  Constitutional: Patient appears well-developed . Obese No distress.  HEENT: head atraumatic, normocephalic, pupils equal and reactive to light,neck supple, throat within normal limits Cardiovascular: Normal rate, regular rhythm and normal heart sounds.  2/6 holosystolic murmur audible on 2nd left intercostal space. No BLE edema. Pulmonary/Chest: Effort normal and breath sounds normal. No respiratory distress. Abdominal: Soft.  There is no tenderness. Psychiatric: Patient has a normal mood and affect. behavior is normal. Judgment and thought content normal.  Recent Results (from the past 2160 hour(s))  POCT HgB A1C     Status: Normal   Collection Time: 02/26/15 11:33 AM   Result Value Ref Range   Hemoglobin A1C 5.7   POCT UA - Microalbumin     Status: Abnormal   Collection Time: 02/26/15 11:33 AM  Result Value Ref Range   Microalbumin Ur, POC 20 mg/L   Creatinine, POC  mg/dL   Albumin/Creatinine Ratio, Urine, POC    Comprehensive metabolic panel     Status: Abnormal   Collection Time: 02/26/15 12:20 PM  Result Value Ref Range   Glucose 168 (H) 65 - 99 mg/dL   BUN 36 (H) 8 - 27 mg/dL   Creatinine, Ser 1.74 (H) 0.57 - 1.00 mg/dL   GFR calc non Af Amer 30 (L) >59 mL/min/1.73   GFR calc Af Amer 34 (L) >59 mL/min/1.73   BUN/Creatinine Ratio 21 11 - 26   Sodium 145 (H)  134 - 144 mmol/L   Potassium 4.1 3.5 - 5.2 mmol/L   Chloride 99 97 - 108 mmol/L   CO2 29 18 - 29 mmol/L   Calcium 9.3 8.7 - 10.3 mg/dL   Total Protein 7.5 6.0 - 8.5 g/dL   Albumin 3.7 3.6 - 4.8 g/dL   Globulin, Total 3.8 1.5 - 4.5 g/dL   Albumin/Globulin Ratio 1.0 (L) 1.1 - 2.5   Bilirubin Total 0.6 0.0 - 1.2 mg/dL   Alkaline Phosphatase 99 39 - 117 IU/L   AST 12 0 - 40 IU/L   ALT 8 0 - 32 IU/L  Hematocrit     Status: Abnormal   Collection Time: 02/26/15 12:20 PM  Result Value Ref Range   Hematocrit 29.3 (L) 34.0 - 46.6 %  Vit D  25 hydroxy (rtn osteoporosis monitoring)     Status: None   Collection Time: 02/26/15 12:20 PM  Result Value Ref Range   Vit D, 25-Hydroxy 50.7 30.0 - 100.0 ng/mL    Comment: Vitamin D deficiency has been defined by the Institute of Medicine and an Endocrine Society practice guideline as a level of serum 25-OH vitamin D less than 20 ng/mL (1,2). The Endocrine Society went on to further define vitamin D insufficiency as a level between 21 and 29 ng/mL (2). 1. IOM (Institute of Medicine). 2010. Dietary reference    intakes for calcium and D. [place]: The    [employer]. 2. Holick MF, Binkley [place], Bischoff-Ferrari HA, et al.    Evaluation, treatment, and prevention of vitamin D    deficiency: an Endocrine Society clinical practice     guideline. JCEM. 2011 Jul; 96(7):1911-30.   Ferritin     Status: None   Collection Time: 02/26/15 12:20 PM  Result Value Ref Range   Ferritin 34 15 - 150 ng/mL  Specimen status report     Status: None   Collection Time: 02/26/15 12:20 PM  Result Value Ref Range   specimen status report Comment     Comment: Written Authorization Written Authorization Written Authorization Received. Authorization received from [place] 03-02-2015 Logged by Nicholaus Corolla   Troponin I     Status: Abnormal   Collection Time: 03/27/15  5:10 PM  Result Value Ref Range   Troponin I 0.06 (H) <0.031 ng/mL    Comment: READ BACK AND VERIFIED WITH DERRICK POTTS ON 03/27/15 AT 1902PM BY TB        PERSISTENTLY INCREASED TROPONIN VALUES IN THE RANGE OF 0.04-0.49 ng/mL CAN BE SEEN IN:       -UNSTABLE ANGINA       -CONGESTIVE HEART FAILURE       -MYOCARDITIS       -CHEST TRAUMA       -ARRYHTHMIAS       -LATE PRESENTING MYOCARDIAL INFARCTION       -COPD   CLINICAL FOLLOW-UP RECOMMENDED.   CBC with Differential     Status: Abnormal   Collection Time: 03/27/15  5:10 PM  Result Value Ref Range   WBC 17.5 (H) 3.6 - 11.0 K/uL   RBC 4.45 3.80 - 5.20 MIL/uL   Hemoglobin 8.3 (L) 12.0 - 16.0 g/dL   HCT 26.9 (L) 35.0 - 47.0 %   MCV 60.5 (L) 80.0 - 100.0 fL   MCH 18.7 (L) 26.0 - 34.0 pg   MCHC 30.9 (L) 32.0 - 36.0 g/dL   RDW 22.9 (H) 11.5 - 14.5 %   Platelets 204 150 - 440 K/uL  Neutrophils Relative % 94 %   Neutro Abs 16.5 (H) 1.4 - 6.5 K/uL   Lymphocytes Relative 3 %   Lymphs Abs 0.5 (L) 1.0 - 3.6 K/uL   Monocytes Relative 3 %   Monocytes Absolute 0.5 0.2 - 0.9 K/uL   Eosinophils Relative 0 %   Eosinophils Absolute 0.0 0 - 0.7 K/uL   Basophils Relative 0 %   Basophils Absolute 0.0 0 - 0.1 K/uL  Basic metabolic panel     Status: Abnormal   Collection Time: 03/27/15  5:10 PM  Result Value Ref Range   Sodium 140 135 - 145 mmol/L   Potassium 4.7 3.5 - 5.1 mmol/L   Chloride 101 101 - 111 mmol/L    CO2 26 22 - 32 mmol/L   Glucose, Bld 210 (H) 65 - 99 mg/dL   BUN 48 (H) 6 - 20 mg/dL   Creatinine, Ser 2.10 (H) 0.44 - 1.00 mg/dL   Calcium 8.7 (L) 8.9 - 10.3 mg/dL   GFR calc non Af Amer 23 (L) >60 mL/min   GFR calc Af Amer 27 (L) >60 mL/min    Comment: (NOTE) The eGFR has been calculated using the CKD EPI equation. This calculation has not been validated in all clinical situations. eGFR's persistently <60 mL/min signify possible Chronic Kidney Disease.    Anion gap 13 5 - 15  Brain natriuretic peptide     Status: Abnormal   Collection Time: 03/27/15  5:10 PM  Result Value Ref Range   B Natriuretic Peptide 316.0 (H) 0.0 - 100.0 pg/mL  Blood culture (routine x 2)     Status: None   Collection Time: 03/27/15  6:37 PM  Result Value Ref Range   Specimen Description BLOOD RIGHT HAND    Special Requests BOTTLES DRAWN AEROBIC AND ANAEROBIC  1CC    Culture NO GROWTH 5 DAYS    Report Status 04/01/2015 FINAL   Blood culture (routine x 2)     Status: None   Collection Time: 03/27/15  6:54 PM  Result Value Ref Range   Specimen Description BLOOD RIGHT ASSIST CONTROL    Special Requests      BOTTLES DRAWN AEROBIC AND ANAEROBIC  10CC AEROBIC, 5CC ANAEROBIC   Culture  Setup Time      GRAM POSITIVE COCCI IN CLUSTERS AEROBIC BOTTLE ONLY CRITICAL RESULT CALLED TO, READ BACK BY AND VERIFIED WITH: DELL HOPKINS 03/29/2015 0110 [place] VERIFIED BY AJO    Culture      COAGULASE NEGATIVE STAPHYLOCOCCUS AEROBIC BOTTLE ONLY Results consistent with contamination.    Report Status 04/02/2015 FINAL   Glucose, capillary     Status: Abnormal   Collection Time: 03/27/15  9:59 PM  Result Value Ref Range   Glucose-Capillary 265 (H) 65 - 99 mg/dL  Lactic acid, plasma     Status: Abnormal   Collection Time: 03/27/15 10:38 PM  Result Value Ref Range   Lactic Acid, Venous 2.7 (HH) 0.5 - 2.0 mmol/L    Comment: CRITICAL RESULT CALLED TO, READ BACK BY AND VERIFIED WITH TES THOMAS 03/27/2015 [facility]  Blood gas,  arterial     Status: Abnormal   Collection Time: 03/27/15 10:43 PM  Result Value Ref Range   FIO2 0.55    Delivery systems BILEVEL POSITIVE AIRWAY PRESSURE    Inspiratory PAP 15    Expiratory PAP 8.0    pH, Arterial 7.37 7.350 - 7.450   pCO2 arterial 50 (H) 32.0 - 48.0 mmHg   pO2, Arterial 83 83.0 -  108.0 mmHg   Bicarbonate 28.9 (H) 21.0 - 28.0 mEq/L   Acid-Base Excess 2.7 0.0 - 3.0 mmol/L   O2 Saturation 95.8 %   Patient temperature 37.0    Collection site RIGHT RADIAL    Sample type ARTERIAL DRAW    Allens test (pass/fail) POSITIVE (A) PASS   Mechanical Rate 10   Glucose, capillary     Status: Abnormal   Collection Time: 03/28/15 12:00 AM  Result Value Ref Range   Glucose-Capillary 303 (H) 65 - 99 mg/dL  Glucose, capillary     Status: Abnormal   Collection Time: 03/28/15  1:58 AM  Result Value Ref Range   Glucose-Capillary 340 (H) 65 - 99 mg/dL  Glucose, capillary     Status: Abnormal   Collection Time: 03/28/15  2:57 AM  Result Value Ref Range   Glucose-Capillary 305 (H) 65 - 99 mg/dL  Glucose, capillary     Status: Abnormal   Collection Time: 03/28/15  4:14 AM  Result Value Ref Range   Glucose-Capillary 298 (H) 65 - 99 mg/dL  Glucose, capillary     Status: Abnormal   Collection Time: 03/28/15  5:04 AM  Result Value Ref Range   Glucose-Capillary 259 (H) 65 - 99 mg/dL  Basic metabolic panel     Status: Abnormal   Collection Time: 03/28/15  5:40 AM  Result Value Ref Range   Sodium 139 135 - 145 mmol/L   Potassium 4.1 3.5 - 5.1 mmol/L   Chloride 102 101 - 111 mmol/L   CO2 29 22 - 32 mmol/L   Glucose, Bld 265 (H) 65 - 99 mg/dL   BUN 47 (H) 6 - 20 mg/dL   Creatinine, Ser 2.04 (H) 0.44 - 1.00 mg/dL   Calcium 8.5 (L) 8.9 - 10.3 mg/dL   GFR calc non Af Amer 24 (L) >60 mL/min   GFR calc Af Amer 27 (L) >60 mL/min    Comment: (NOTE) The eGFR has been calculated using the CKD EPI equation. This calculation has not been validated in all clinical situations. eGFR's  persistently <60 mL/min signify possible Chronic Kidney Disease.    Anion gap 8 5 - 15  CBC     Status: Abnormal   Collection Time: 03/28/15  5:40 AM  Result Value Ref Range   WBC 10.3 3.6 - 11.0 K/uL   RBC 4.09 3.80 - 5.20 MIL/uL   Hemoglobin 7.5 (L) 12.0 - 16.0 g/dL   HCT 25.2 (L) 35.0 - 47.0 %   MCV 61.6 (L) 80.0 - 100.0 fL   MCH 18.4 (L) 26.0 - 34.0 pg   MCHC 29.9 (L) 32.0 - 36.0 g/dL   RDW 22.9 (H) 11.5 - 14.5 %   Platelets 184 150 - 440 K/uL  Lactic acid, plasma     Status: Abnormal   Collection Time: 03/28/15  5:40 AM  Result Value Ref Range   Lactic Acid, Venous 2.1 (HH) 0.5 - 2.0 mmol/L    Comment: CRITICAL RESULT CALLED TO, READ BACK BY AND VERIFIED WITH EMMA ETWUATU 03/28/2015 0623 [facility]  Procalcitonin - Baseline     Status: None   Collection Time: 03/28/15  5:40 AM  Result Value Ref Range   Procalcitonin 5.29 ng/mL    Comment:        Interpretation: PCT > 2 ng/mL: Systemic infection (sepsis) is likely, unless other causes are known. (NOTE)         ICU PCT Algorithm  Non ICU PCT Algorithm    ----------------------------     ------------------------------         PCT < 0.25 ng/mL                 PCT < 0.1 ng/mL     Stopping of antibiotics            Stopping of antibiotics       strongly encouraged.               strongly encouraged.    ----------------------------     ------------------------------       PCT level decrease by               PCT < 0.25 ng/mL       >= 80% from peak PCT       OR PCT 0.25 - 0.5 ng/mL          Stopping of antibiotics                                             encouraged.     Stopping of antibiotics           encouraged.    ----------------------------     ------------------------------       PCT level decrease by              PCT >= 0.25 ng/mL       < 80% from peak PCT        AND PCT >= 0.5 ng/mL            Continuing antibiotics                                               encouraged.       Continuing antibiotics             encouraged.    ----------------------------     ------------------------------     PCT level increase compared          PCT > 0.5 ng/mL         with peak PCT AND          PCT >= 0.5 ng/mL             Escalation of antibiotics                                          strongly encouraged.      Escalation of antibiotics        strongly encouraged.   Brain natriuretic peptide     Status: Abnormal   Collection Time: 03/28/15  5:40 AM  Result Value Ref Range   B Natriuretic Peptide 402.0 (H) 0.0 - 100.0 pg/mL  Glucose, capillary     Status: Abnormal   Collection Time: 03/28/15  6:13 AM  Result Value Ref Range   Glucose-Capillary 200 (H) 65 - 99 mg/dL  Glucose, capillary     Status: Abnormal   Collection Time: 03/28/15  7:13 AM  Result Value Ref Range   Glucose-Capillary 167 (H) 65 - 99 mg/dL  Glucose, capillary     Status:  Abnormal   Collection Time: 03/28/15  8:34 AM  Result Value Ref Range   Glucose-Capillary 119 (H) 65 - 99 mg/dL  Glucose, capillary     Status: None   Collection Time: 03/28/15  9:54 AM  Result Value Ref Range   Glucose-Capillary 92 65 - 99 mg/dL  Glucose, capillary     Status: Abnormal   Collection Time: 03/28/15 10:44 AM  Result Value Ref Range   Glucose-Capillary 107 (H) 65 - 99 mg/dL  MRSA PCR Screening     Status: None   Collection Time: 03/28/15 10:45 AM  Result Value Ref Range   MRSA by PCR NEGATIVE NEGATIVE    Comment:        The GeneXpert MRSA Assay (FDA approved for NASAL specimens only), is one component of a comprehensive MRSA colonization surveillance program. It is not intended to diagnose MRSA infection nor to guide or monitor treatment for MRSA infections.   Glucose, capillary     Status: Abnormal   Collection Time: 03/28/15 12:05 PM  Result Value Ref Range   Glucose-Capillary 140 (H) 65 - 99 mg/dL  Glucose, capillary     Status: Abnormal   Collection Time: 03/28/15  4:28 PM  Result Value Ref Range   Glucose-Capillary 243  (H) 65 - 99 mg/dL  Glucose, capillary     Status: Abnormal   Collection Time: 03/28/15  7:49 PM  Result Value Ref Range   Glucose-Capillary 254 (H) 65 - 99 mg/dL  Glucose, capillary     Status: Abnormal   Collection Time: 03/29/15 12:16 AM  Result Value Ref Range   Glucose-Capillary 194 (H) 65 - 99 mg/dL  Legionella antigen, urine     Status: None   Collection Time: 03/29/15  2:08 AM  Result Value Ref Range   Specimen Description URINE, RANDOM    Special Requests NONE    Report Status 03/29/2015 FINAL   L. Pneumophila Serogp 1 Ur Ag     Status: None   Collection Time: 03/29/15  2:08 AM  Result Value Ref Range   L. pneumophila Serogp 1 Ur Ag Negative Negative    Comment: (NOTE) Performed At: [facility] [place], [place] [id number] Lindon Romp MD EM:[id number]    Source of Sample URINE, RANDOM   Glucose, capillary     Status: Abnormal   Collection Time: 03/29/15  4:01 AM  Result Value Ref Range   Glucose-Capillary 194 (H) 65 - 99 mg/dL  Basic metabolic panel     Status: Abnormal   Collection Time: 03/29/15  4:24 AM  Result Value Ref Range   Sodium 140 135 - 145 mmol/L   Potassium 4.2 3.5 - 5.1 mmol/L   Chloride 101 101 - 111 mmol/L   CO2 30 22 - 32 mmol/L   Glucose, Bld 195 (H) 65 - 99 mg/dL   BUN 55 (H) 6 - 20 mg/dL   Creatinine, Ser 2.11 (H) 0.44 - 1.00 mg/dL   Calcium 8.5 (L) 8.9 - 10.3 mg/dL   GFR calc non Af Amer 23 (L) >60 mL/min   GFR calc Af Amer 26 (L) >60 mL/min    Comment: (NOTE) The eGFR has been calculated using the CKD EPI equation. This calculation has not been validated in all clinical situations. eGFR's persistently <60 mL/min signify possible Chronic Kidney Disease.    Anion gap 9 5 - 15  Procalcitonin     Status: None   Collection Time: 03/29/15  4:24 AM  Result Value  Ref Range   Procalcitonin 7.05 ng/mL    Comment:        Interpretation: PCT > 2 ng/mL: Systemic infection (sepsis) is likely, unless other causes are  known. (NOTE)         ICU PCT Algorithm               Non ICU PCT Algorithm    ----------------------------     ------------------------------         PCT < 0.25 ng/mL                 PCT < 0.1 ng/mL     Stopping of antibiotics            Stopping of antibiotics       strongly encouraged.               strongly encouraged.    ----------------------------     ------------------------------       PCT level decrease by               PCT < 0.25 ng/mL       >= 80% from peak PCT       OR PCT 0.25 - 0.5 ng/mL          Stopping of antibiotics                                             encouraged.     Stopping of antibiotics           encouraged.    ----------------------------     ------------------------------       PCT level decrease by              PCT >= 0.25 ng/mL       < 80% from peak PCT        AND PCT >= 0.5 ng/mL            Continuing antibiotics                                               encouraged.       Continuing antibiotics            encouraged.    ----------------------------     ------------------------------     PCT level increase compared          PCT > 0.5 ng/mL         with peak PCT AND          PCT >= 0.5 ng/mL             Escalation of antibiotics                                          strongly encouraged.      Escalation of antibiotics        strongly encouraged.   TSH     Status: None   Collection Time: 03/29/15  4:24 AM  Result Value Ref Range   TSH 1.974 0.350 - 4.500 uIU/mL  Iron and TIBC     Status: Abnormal   Collection Time: 03/29/15  4:24 AM  Result Value Ref Range   Iron 19 (L) 28 - 170 ug/dL   TIBC 314 250 - 450 ug/dL   Saturation Ratios 6 (L) 10.4 - 31.8 %   UIBC 295 ug/dL  Ferritin     Status: None   Collection Time: 03/29/15  4:24 AM  Result Value Ref Range   Ferritin 40 11 - 307 ng/mL  CBC     Status: Abnormal   Collection Time: 03/29/15  7:22 AM  Result Value Ref Range   WBC 15.1 (H) 3.6 - 11.0 K/uL   RBC 3.86 3.80 - 5.20 MIL/uL    Hemoglobin 7.1 (L) 12.0 - 16.0 g/dL   HCT 23.8 (L) 35.0 - 47.0 %   MCV 61.5 (L) 80.0 - 100.0 fL   MCH 18.4 (L) 26.0 - 34.0 pg   MCHC 29.9 (L) 32.0 - 36.0 g/dL   RDW 22.9 (H) 11.5 - 14.5 %   Platelets 231 150 - 440 K/uL  Glucose, capillary     Status: Abnormal   Collection Time: 03/29/15  7:37 AM  Result Value Ref Range   Glucose-Capillary 180 (H) 65 - 99 mg/dL  Glucose, capillary     Status: Abnormal   Collection Time: 03/29/15 11:42 AM  Result Value Ref Range   Glucose-Capillary 180 (H) 65 - 99 mg/dL   Comment 1 Notify RN   Glucose, capillary     Status: None   Collection Time: 03/29/15  4:17 PM  Result Value Ref Range   Glucose-Capillary 92 65 - 99 mg/dL  Glucose, capillary     Status: Abnormal   Collection Time: 03/29/15  9:34 PM  Result Value Ref Range   Glucose-Capillary 156 (H) 65 - 99 mg/dL  Procalcitonin     Status: None   Collection Time: 03/30/15  4:07 AM  Result Value Ref Range   Procalcitonin 3.94 ng/mL  CBC     Status: Abnormal   Collection Time: 03/30/15  4:07 AM  Result Value Ref Range   WBC 10.9 3.6 - 11.0 K/uL   RBC 3.97 3.80 - 5.20 MIL/uL   Hemoglobin 7.2 (L) 12.0 - 16.0 g/dL   HCT 24.1 (L) 35.0 - 47.0 %   MCV 60.7 (L) 80.0 - 100.0 fL   MCH 18.1 (L) 26.0 - 34.0 pg   MCHC 29.8 (L) 32.0 - 36.0 g/dL   RDW 22.7 (H) 11.5 - 14.5 %   Platelets 241 150 - 440 K/uL  Basic metabolic panel     Status: Abnormal   Collection Time: 03/30/15  4:07 AM  Result Value Ref Range   Sodium 140 135 - 145 mmol/L   Potassium 3.7 3.5 - 5.1 mmol/L   Chloride 100 (L) 101 - 111 mmol/L   CO2 32 22 - 32 mmol/L   Glucose, Bld 171 (H) 65 - 99 mg/dL   BUN 53 (H) 6 - 20 mg/dL   Creatinine, Ser 2.02 (H) 0.44 - 1.00 mg/dL   Calcium 8.4 (L) 8.9 - 10.3 mg/dL   GFR calc non Af Amer 24 (L) >60 mL/min   GFR calc Af Amer 28 (L) >60 mL/min    Comment: (NOTE) The eGFR has been calculated using the CKD EPI equation. This calculation has not been validated in all clinical situations. eGFR's  persistently <60 mL/min signify possible Chronic Kidney Disease.    Anion gap 8 5 - 15  Glucose, capillary     Status: Abnormal   Collection Time: 03/30/15  7:45 AM  Result Value Ref Range   Glucose-Capillary 185 (H) 65 - 99 mg/dL   Comment 1 Notify RN   Glucose, capillary     Status: Abnormal   Collection Time: 03/30/15 11:17 AM  Result Value Ref Range   Glucose-Capillary 230 (H) 65 - 99 mg/dL   Comment 1 Notify RN   Glucose, capillary     Status: Abnormal   Collection Time: 03/30/15  4:24 PM  Result Value Ref Range   Glucose-Capillary 137 (H) 65 - 99 mg/dL   Comment 1 Notify RN   Glucose, capillary     Status: Abnormal   Collection Time: 03/30/15  7:39 PM  Result Value Ref Range   Glucose-Capillary 159 (H) 65 - 99 mg/dL   Comment 1 Notify RN   Glucose, capillary     Status: Abnormal   Collection Time: 03/31/15  7:24 AM  Result Value Ref Range   Glucose-Capillary 170 (H) 65 - 99 mg/dL  Glucose, capillary     Status: Abnormal   Collection Time: 03/31/15 11:08 AM  Result Value Ref Range   Glucose-Capillary 203 (H) 65 - 99 mg/dL   PHQ2/9: Depression screen PHQ 2/9 02/26/2015  Decreased Interest 0  Down, Depressed, Hopeless 0  PHQ - 2 Score 0     Fall Risk: Fall Risk  02/26/2015  Falls in the past year? No     Assessment & Plan  1. Acute respiratory failure with hypoxia and hypercapnia (HCC)  resolved  2. Diastolic heart failure, NYHA class 3 ([place])  She was in acute heart failure, but now back to baseline, SOB with mild activity   3. Dependence on supplemental oxygen   Currently using it prn  4. CAP (community acquired pneumonia)  Admitted 09/13 we will recheck labs and CXR - CBC with Differential/Platelet - DG Chest 2 View; Future  5. Chronic kidney disease (CKD), stage IV (severe) (HCC)  - Basic metabolic panel; Future

## 2015-04-27 DIAGNOSIS — M545 Low back pain: Secondary | ICD-10-CM | POA: Diagnosis not present

## 2015-04-27 LAB — CBC WITH DIFFERENTIAL/PLATELET
BASOS ABS: 0 10*3/uL (ref 0.0–0.2)
Basos: 0 %
EOS (ABSOLUTE): 0.1 10*3/uL (ref 0.0–0.4)
Eos: 1 %
Hematocrit: 36.6 % (ref 34.0–46.6)
Hemoglobin: 10.1 g/dL — ABNORMAL LOW (ref 11.1–15.9)
IMMATURE GRANS (ABS): 0 10*3/uL (ref 0.0–0.1)
Immature Granulocytes: 0 %
LYMPHS ABS: 1.6 10*3/uL (ref 0.7–3.1)
LYMPHS: 17 %
MCH: 19.4 pg — AB (ref 26.6–33.0)
MCHC: 27.6 g/dL — ABNORMAL LOW (ref 31.5–35.7)
MCV: 70 fL — AB (ref 79–97)
MONOS ABS: 0.6 10*3/uL (ref 0.1–0.9)
Monocytes: 7 %
NEUTROS ABS: 6.9 10*3/uL (ref 1.4–7.0)
Neutrophils: 75 %
PLATELETS: 234 10*3/uL (ref 150–379)
RBC: 5.2 x10E6/uL (ref 3.77–5.28)
RDW: 22.5 % — ABNORMAL HIGH (ref 12.3–15.4)
WBC: 9.2 10*3/uL (ref 3.4–10.8)

## 2015-04-28 DIAGNOSIS — M545 Low back pain: Secondary | ICD-10-CM | POA: Diagnosis not present

## 2015-04-29 DIAGNOSIS — M545 Low back pain: Secondary | ICD-10-CM | POA: Diagnosis not present

## 2015-04-30 DIAGNOSIS — M545 Low back pain: Secondary | ICD-10-CM | POA: Diagnosis not present

## 2015-05-01 DIAGNOSIS — M545 Low back pain: Secondary | ICD-10-CM | POA: Diagnosis not present

## 2015-05-02 DIAGNOSIS — M545 Low back pain: Secondary | ICD-10-CM | POA: Diagnosis not present

## 2015-05-03 DIAGNOSIS — M545 Low back pain: Secondary | ICD-10-CM | POA: Diagnosis not present

## 2015-05-04 DIAGNOSIS — M545 Low back pain: Secondary | ICD-10-CM | POA: Diagnosis not present

## 2015-05-05 DIAGNOSIS — M545 Low back pain: Secondary | ICD-10-CM | POA: Diagnosis not present

## 2015-05-06 DIAGNOSIS — M545 Low back pain: Secondary | ICD-10-CM | POA: Diagnosis not present

## 2015-05-07 DIAGNOSIS — M545 Low back pain: Secondary | ICD-10-CM | POA: Diagnosis not present

## 2015-05-08 DIAGNOSIS — M545 Low back pain: Secondary | ICD-10-CM | POA: Diagnosis not present

## 2015-05-09 DIAGNOSIS — M545 Low back pain: Secondary | ICD-10-CM | POA: Diagnosis not present

## 2015-05-10 DIAGNOSIS — M545 Low back pain: Secondary | ICD-10-CM | POA: Diagnosis not present

## 2015-05-11 DIAGNOSIS — M545 Low back pain: Secondary | ICD-10-CM | POA: Diagnosis not present

## 2015-05-12 DIAGNOSIS — M545 Low back pain: Secondary | ICD-10-CM | POA: Diagnosis not present

## 2015-05-13 DIAGNOSIS — M545 Low back pain: Secondary | ICD-10-CM | POA: Diagnosis not present

## 2015-05-15 DIAGNOSIS — M545 Low back pain: Secondary | ICD-10-CM | POA: Diagnosis not present

## 2015-05-16 DIAGNOSIS — M545 Low back pain: Secondary | ICD-10-CM | POA: Diagnosis not present

## 2015-05-17 DIAGNOSIS — M545 Low back pain: Secondary | ICD-10-CM | POA: Diagnosis not present

## 2015-05-18 DIAGNOSIS — M545 Low back pain: Secondary | ICD-10-CM | POA: Diagnosis not present

## 2015-05-19 DIAGNOSIS — M545 Low back pain: Secondary | ICD-10-CM | POA: Diagnosis not present

## 2015-05-20 DIAGNOSIS — M545 Low back pain: Secondary | ICD-10-CM | POA: Diagnosis not present

## 2015-05-21 DIAGNOSIS — M545 Low back pain: Secondary | ICD-10-CM | POA: Diagnosis not present

## 2015-05-22 DIAGNOSIS — M545 Low back pain: Secondary | ICD-10-CM | POA: Diagnosis not present

## 2015-05-23 DIAGNOSIS — M545 Low back pain: Secondary | ICD-10-CM | POA: Diagnosis not present

## 2015-05-24 DIAGNOSIS — M545 Low back pain: Secondary | ICD-10-CM | POA: Diagnosis not present

## 2015-05-25 DIAGNOSIS — M545 Low back pain: Secondary | ICD-10-CM | POA: Diagnosis not present

## 2015-05-26 DIAGNOSIS — M545 Low back pain: Secondary | ICD-10-CM | POA: Diagnosis not present

## 2015-05-27 DIAGNOSIS — M545 Low back pain: Secondary | ICD-10-CM | POA: Diagnosis not present

## 2015-05-28 DIAGNOSIS — M545 Low back pain: Secondary | ICD-10-CM | POA: Diagnosis not present

## 2015-05-29 DIAGNOSIS — M545 Low back pain: Secondary | ICD-10-CM | POA: Diagnosis not present

## 2015-05-30 ENCOUNTER — Ambulatory Visit: Payer: Self-pay | Admitting: Family Medicine

## 2015-05-30 DIAGNOSIS — M545 Low back pain: Secondary | ICD-10-CM | POA: Diagnosis not present

## 2015-05-31 DIAGNOSIS — M545 Low back pain: Secondary | ICD-10-CM | POA: Diagnosis not present

## 2015-06-01 DIAGNOSIS — M545 Low back pain: Secondary | ICD-10-CM | POA: Diagnosis not present

## 2015-06-02 DIAGNOSIS — M545 Low back pain: Secondary | ICD-10-CM | POA: Diagnosis not present

## 2015-06-03 DIAGNOSIS — M545 Low back pain: Secondary | ICD-10-CM | POA: Diagnosis not present

## 2015-06-18 ENCOUNTER — Other Ambulatory Visit: Payer: Self-pay | Admitting: Family Medicine

## 2015-06-26 ENCOUNTER — Ambulatory Visit (INDEPENDENT_AMBULATORY_CARE_PROVIDER_SITE_OTHER): Payer: Commercial Managed Care - HMO | Admitting: Family Medicine

## 2015-06-26 ENCOUNTER — Encounter: Payer: Self-pay | Admitting: Family Medicine

## 2015-06-26 VITALS — BP 132/84 | HR 60 | Temp 97.3°F | Resp 16 | Ht 64.0 in | Wt 297.6 lb

## 2015-06-26 DIAGNOSIS — E785 Hyperlipidemia, unspecified: Secondary | ICD-10-CM

## 2015-06-26 DIAGNOSIS — G4733 Obstructive sleep apnea (adult) (pediatric): Secondary | ICD-10-CM

## 2015-06-26 DIAGNOSIS — J3089 Other allergic rhinitis: Secondary | ICD-10-CM

## 2015-06-26 DIAGNOSIS — J309 Allergic rhinitis, unspecified: Secondary | ICD-10-CM

## 2015-06-26 DIAGNOSIS — I10 Essential (primary) hypertension: Secondary | ICD-10-CM | POA: Diagnosis not present

## 2015-06-26 DIAGNOSIS — E1140 Type 2 diabetes mellitus with diabetic neuropathy, unspecified: Secondary | ICD-10-CM | POA: Diagnosis not present

## 2015-06-26 DIAGNOSIS — Z23 Encounter for immunization: Secondary | ICD-10-CM | POA: Diagnosis not present

## 2015-06-26 DIAGNOSIS — Z794 Long term (current) use of insulin: Secondary | ICD-10-CM

## 2015-06-26 DIAGNOSIS — J302 Other seasonal allergic rhinitis: Secondary | ICD-10-CM

## 2015-06-26 DIAGNOSIS — G4734 Idiopathic sleep related nonobstructive alveolar hypoventilation: Secondary | ICD-10-CM

## 2015-06-26 DIAGNOSIS — I5030 Unspecified diastolic (congestive) heart failure: Secondary | ICD-10-CM

## 2015-06-26 DIAGNOSIS — K219 Gastro-esophageal reflux disease without esophagitis: Secondary | ICD-10-CM

## 2015-06-26 DIAGNOSIS — J069 Acute upper respiratory infection, unspecified: Secondary | ICD-10-CM

## 2015-06-26 DIAGNOSIS — N184 Chronic kidney disease, stage 4 (severe): Secondary | ICD-10-CM | POA: Diagnosis not present

## 2015-06-26 LAB — POCT GLYCOSYLATED HEMOGLOBIN (HGB A1C): Hemoglobin A1C: 6.7

## 2015-06-26 MED ORDER — OMEPRAZOLE 40 MG PO CPDR: 40.0000 mg | DELAYED_RELEASE_CAPSULE | Freq: Every day | ORAL | Status: DC

## 2015-06-26 MED ORDER — MONTELUKAST SODIUM 10 MG PO TABS: 10.0000 mg | ORAL_TABLET | Freq: Every day | ORAL | Status: DC

## 2015-06-26 MED ORDER — GABAPENTIN 300 MG PO CAPS: 300.0000 mg | ORAL_CAPSULE | Freq: Three times a day (TID) | ORAL | Status: DC

## 2015-06-26 MED ORDER — BENZONATATE 100 MG PO CAPS: 100.0000 mg | ORAL_CAPSULE | Freq: Three times a day (TID) | ORAL | Status: DC | PRN

## 2015-06-26 MED ORDER — AMLODIPINE BESYLATE 10 MG PO TABS: 10.0000 mg | ORAL_TABLET | Freq: Every day | ORAL | Status: DC

## 2015-06-26 NOTE — Progress Notes (Signed)
Name: Veronica Anderson   MRN: [id number]    DOB: [birth year]   Date:06/26/2015       Progress Note  Subjective  Chief Complaint  Chief Complaint  Patient presents with  . Medication Refill    follow-up 3 months  . Diabetes    checks glucose 2x day low-50's, high-220's  . Hypertension  . Hyperlipidemia  . Gastroesophageal Reflux  . URI    onset 2 weeks, syptoms include congestion, runny nose, headache, sweating and cough    HPI  DMII: no appetite, but she has been eating. She has lost 4 lbs since last visit. She is following a diabetic diet and glucose has been at goal, glucose around 90-100's fasting.  She has been using Humalog 5 units before meals. She is due for eye exam - son will try to schedule an appointment for her now. Using Lantus 42 units daily, but she is adjusting based on how much she eats. Explained it is a basal insulin and should not be adjusted that way. She has retinopathy and needs to follow up with Ophthalmologist, she states neuropathy is under control with Gabapentin  HTN: she has been taking medication , no chest pain or palpitation. Complaint with medication and denies side effects  Hyperlipidemia: taking medication and denies side effects, including no myalgia  GERD: doing well at this time, nausea, no heartburn or regurgitation. Taking Omeprazole as prescribed  CKI: she was seen by nephrologist, and was advised to continue follow up, son has not made her a follow up appointment yet.   OSA: uses CPAP every night, all night and is having supplemental oxygen through the CPAP machine  CHF: diastolic, she still has orthopnea, uses 3 pillows at night, using CPAP, no leg swelling, states she denies dyspnea. Feeling well. On beta-blocker, hydralazine, no on ACE/ARB because of allergies  URI: symptoms started two weeks ago, symptoms started with rhinorrhea, nasal congestion, but has progressed, has some facial pressure and chest congestion. She was taking Coricidin  HBP with mild improvement of symptoms. She had some chills and subjective fever initially but it has resolved. Symptoms are improving  Obesity: she has protein malnutrition, lost a few lbs since last visit. Discussed importance of eating healthier choices.   Patient Active Problem List   Diagnosis Date Noted  . Nocturnal oxygen desaturation 06/26/2015  . Anemia in chronic illness 02/23/2015  . Asthma, mild intermittent 02/23/2015  . Benign essential HTN 02/23/2015  . Diastolic heart failure, NYHA class 3 ([place]) 02/23/2015  . Chronic kidney disease (CKD), stage IV (severe) ([place]) 02/23/2015  . CN (constipation) 02/23/2015  . Diabetes mellitus with neuropathy ([place]) 02/23/2015  . Dyslipidemia 02/23/2015  . Elevated ferritin 02/23/2015  . Gastro-esophageal reflux disease without esophagitis 02/23/2015  . LBP (low back pain) 02/23/2015  . Extreme obesity ([place]) 02/23/2015  . Obstructive apnea 02/23/2015  . Osteoarthritis 02/23/2015  . Neuropathy ([place]) 02/23/2015  . Perennial allergic rhinitis with seasonal variation 02/23/2015  . History of pneumonia 02/23/2015  . Vitamin D deficiency 02/23/2015  . Cataract 04/16/2010  . Background diabetic retinopathy ([place]) 12/18/2009    Past Surgical History  Procedure Laterality Date  . Ankle surgery Left     Family History  Problem Relation Age of Onset  . Diabetes type II      Social History   Social History  . Marital Status: Single    Spouse Name: N/A  . Number of Children: N/A  . Years of Education: N/A   Occupational History  .  Not on file.   Social History Main Topics  . Smoking status: Former Smoker    Types: Cigarettes    Quit date: 07/14/1994  . Smokeless tobacco: Not on file  . Alcohol Use: No  . Drug Use: No  . Sexual Activity: Not Currently   Other Topics Concern  . Not on file   Social History Narrative     Current outpatient prescriptions:  .  amLODipine (NORVASC) 10 MG tablet, Take 1 tablet (10 mg total)  by mouth daily., Disp: 30 tablet, Rfl: 5 .  aspirin EC 81 MG tablet, Take 81 mg by mouth at bedtime., Disp: , Rfl:  .  cloNIDine (CATAPRES-TTS-3) 0.3 mg/24hr patch, Place 1 patch (0.3 mg total) onto the skin once a week. (Patient taking differently: Place 0.3 mg onto the skin once a week. Pt applies patch on Sunday.), Disp: 4 patch, Rfl: 12 .  ferrous sulfate 325 (65 FE) MG tablet, Take 1 tablet (325 mg total) by mouth 2 (two) times daily with a meal., Disp: 60 tablet, Rfl: 3 .  fluticasone (FLONASE) 50 MCG/ACT nasal spray, USE 2 SPRAYS IN EACH NOSTRIL EVERY NIGHT AT BEDTIME, Disp: 16 g, Rfl: 2 .  furosemide (LASIX) 20 MG tablet, TAKE 1 TABLET BY MOUTH TWICE A DAY, Disp: 60 tablet, Rfl: 5 .  gabapentin (NEURONTIN) 300 MG capsule, Take 1 capsule (300 mg total) by mouth 3 (three) times daily., Disp: 90 capsule, Rfl: 5 .  hydrALAZINE (APRESOLINE) 50 MG tablet, Take 1 tablet (50 mg total) by mouth 3 (three) times daily., Disp: 90 tablet, Rfl: 5 .  insulin glargine (LANTUS) 100 UNIT/ML injection, Inject 42 Units into the skin at bedtime., Disp: , Rfl:  .  insulin lispro (HUMALOG) 100 UNIT/ML injection, Inject 5 Units into the skin 3 (three) times daily with meals., Disp: , Rfl:  .  loratadine (CLARITIN) 10 MG tablet, Take 10 mg by mouth daily as needed for allergies., Disp: , Rfl:  .  metoprolol (LOPRESSOR) 100 MG tablet, TAKE 1 TABLET BY MOUTH TWICE A DAY, Disp: 60 tablet, Rfl: 5 .  montelukast (SINGULAIR) 10 MG tablet, Take 1 tablet (10 mg total) by mouth daily., Disp: 30 tablet, Rfl: 5 .  nystatin (MYCOSTATIN/NYSTOP) 100000 UNIT/GM POWD, Apply 1 Bottle topically 3 (three) times daily., Disp: 1 Bottle, Rfl: 0 .  omeprazole (PRILOSEC) 40 MG capsule, Take 1 capsule (40 mg total) by mouth daily., Disp: 30 capsule, Rfl: 5 .  pravastatin (PRAVACHOL) 40 MG tablet, TAKE 1 TABLET BY MOUTH ONCE A DAY FOR CHOLESTEROL, Disp: 30 tablet, Rfl: 5 .  Vitamin D, Ergocalciferol, (DRISDOL) 50000 UNITS CAPS capsule, TAKE  ONE CAPSULE BY MOUTH ONCE A WEEK, Disp: 4 capsule, Rfl: 4  Allergies  Allergen Reactions  . Ace Inhibitors Cough     ROS  Constitutional: Negative for fever , mild  weight change.  Respiratory: Positive for cough no shortness of breath.   Cardiovascular: Negative for chest pain or palpitations.  Gastrointestinal: Negative for abdominal pain, no bowel changes.  Musculoskeletal: Positive for gait problem - using wheelchair because of chronic leg and back pain , no  joint swelling.  Skin: Negative for rash.  Neurological: Negative for dizziness or headache.  No other specific complaints in a complete review of systems (except as listed in HPI above).  Objective  Filed Vitals:   06/26/15 1103  BP: 132/84  Pulse: 60  Temp: 97.3 F (36.3 C)  TempSrc: Oral  Resp: 16  Height: 5\' 4"  (1.626  m)  Weight: 297 lb 9.6 oz (134.99 kg)  SpO2: 96%    Body mass index is 51.06 kg/(m^2).  Physical Exam  Constitutional: Patient appears well-developed and well-nourished. Obese  No distress.  HEENT: head atraumatic, normocephalic, pupils equal and reactive to light, ears TM normal bilaterally, neck supple, throat within normal limits. Mild tenderness during palpation of maxillary sinus. Light yellow drainage from nostrils.  Cardiovascular: Normal rate, regular rhythm and normal heart sounds.  No murmur heard. No BLE edema. Pulmonary/Chest: Effort normal and breath sounds normal. No respiratory distress. Abdominal: Soft.  There is no tenderness. Psychiatric: Patient has a normal mood and affect. behavior is normal. Judgment and thought content normal.. Muscular Skeletal: sitting on wheelchair  PHQ2/9: Depression screen [facility] 2/9 06/26/2015 02/26/2015  Decreased Interest 0 0  Down, Depressed, Hopeless 0 0  PHQ - 2 Score 0 0    Fall Risk: Fall Risk  06/26/2015 02/26/2015  Falls in the past year? No No     Functional Status Survey: Is the patient deaf or have difficulty hearing?: No Does  the patient have difficulty seeing, even when wearing glasses/contacts?: No Does the patient have difficulty concentrating, remembering, or making decisions?: No Does the patient have difficulty walking or climbing stairs?: Yes Does the patient have difficulty dressing or bathing?: Yes Does the patient have difficulty doing errands alone such as visiting a doctor's office or shopping?: Yes    Assessment & Plan  1. Controlled type 2 diabetes mellitus with diabetic neuropathy, with long-term current use of insulin (HCC)  Continue medication, use same dose of Lantus daily, only to adjust after 3 days, not based on what she eats.  - POCT HgB A1C - gabapentin (NEURONTIN) 300 MG capsule; Take 1 capsule (300 mg total) by mouth 3 (three) times daily.  Dispense: 90 capsule; Refill: 5  2. Obstructive apnea  Continue CPAP every night  3. Diastolic heart failure, NYHA class 3 (HCC)  Continue current regiment  4. Essential hypertension  - amLODipine (NORVASC) 10 MG tablet; Take 1 tablet (10 mg total) by mouth daily.  Dispense: 30 tablet; Refill: 5  5. Chronic kidney disease (CKD), stage IV (severe) ([place])  Needs follow up with nephrologist  6. Nocturnal oxygen desaturation   7. Perennial allergic rhinitis with seasonal variation  - montelukast (SINGULAIR) 10 MG tablet; Take 1 tablet (10 mg total) by mouth daily.  Dispense: 30 tablet; Refill: 5  8. Upper respiratory infection  Getting better, advised nasal saline, continue flonase, we will give her medication for cough, and call back if worsening of symptoms. Lung exam is normal today, likely viral since she is getting better.  She would like something for cough: Benzonate -100-200 mg up to three times daily   9. Morbid obesity due to excess calories [facility])  Discussed with the patient the risk posed by an increased BMI. Discussed importance of portion control, calorie counting and at least 150 minutes of physical activity weekly - it  can be while sitting down, maybe lifting weights, or moving legs up and down . Avoid sweet beverages and drink more water. Eat at least 6 servings of fruit and vegetables daily   10. Gastro-esophageal reflux disease without esophagitis  - omeprazole (PRILOSEC) 40 MG capsule; Take 1 capsule (40 mg total) by mouth daily.  Dispense: 30 capsule; Refill: 5  11. Dyslipidemia  Continue statin therapy.    12. Need for pneumococcal vaccination  - Pneumococcal conjugate vaccine 13-valent

## 2015-08-06 DIAGNOSIS — M545 Low back pain: Secondary | ICD-10-CM | POA: Diagnosis not present

## 2015-08-07 DIAGNOSIS — M545 Low back pain: Secondary | ICD-10-CM | POA: Diagnosis not present

## 2015-08-08 DIAGNOSIS — M545 Low back pain: Secondary | ICD-10-CM | POA: Diagnosis not present

## 2015-08-09 DIAGNOSIS — M545 Low back pain: Secondary | ICD-10-CM | POA: Diagnosis not present

## 2015-08-10 DIAGNOSIS — M545 Low back pain: Secondary | ICD-10-CM | POA: Diagnosis not present

## 2015-08-11 DIAGNOSIS — M545 Low back pain: Secondary | ICD-10-CM | POA: Diagnosis not present

## 2015-08-12 DIAGNOSIS — M545 Low back pain: Secondary | ICD-10-CM | POA: Diagnosis not present

## 2015-08-13 DIAGNOSIS — M545 Low back pain: Secondary | ICD-10-CM | POA: Diagnosis not present

## 2015-08-15 DIAGNOSIS — M545 Low back pain: Secondary | ICD-10-CM | POA: Diagnosis not present

## 2015-08-16 DIAGNOSIS — M545 Low back pain: Secondary | ICD-10-CM | POA: Diagnosis not present

## 2015-08-17 DIAGNOSIS — M545 Low back pain: Secondary | ICD-10-CM | POA: Diagnosis not present

## 2015-08-18 DIAGNOSIS — M545 Low back pain: Secondary | ICD-10-CM | POA: Diagnosis not present

## 2015-08-19 DIAGNOSIS — M545 Low back pain: Secondary | ICD-10-CM | POA: Diagnosis not present

## 2015-08-20 DIAGNOSIS — M545 Low back pain: Secondary | ICD-10-CM | POA: Diagnosis not present

## 2015-08-21 DIAGNOSIS — M545 Low back pain: Secondary | ICD-10-CM | POA: Diagnosis not present

## 2015-08-22 DIAGNOSIS — M545 Low back pain: Secondary | ICD-10-CM | POA: Diagnosis not present

## 2015-08-23 ENCOUNTER — Other Ambulatory Visit: Payer: Self-pay

## 2015-08-23 DIAGNOSIS — E559 Vitamin D deficiency, unspecified: Secondary | ICD-10-CM

## 2015-08-23 DIAGNOSIS — M545 Low back pain: Secondary | ICD-10-CM | POA: Diagnosis not present

## 2015-08-23 MED ORDER — VITAMIN D (ERGOCALCIFEROL) 1.25 MG (50000 UNIT) PO CAPS: 50000.0000 [IU] | ORAL_CAPSULE | ORAL | Status: DC

## 2015-08-23 NOTE — Telephone Encounter (Signed)
Got a fax form [facility]) requesting a refill of this patient's Vitamin D.  Refill request was sent to Dr. Steele Sizer for approval and submission.

## 2015-08-24 DIAGNOSIS — M545 Low back pain: Secondary | ICD-10-CM | POA: Diagnosis not present

## 2015-08-25 DIAGNOSIS — M545 Low back pain: Secondary | ICD-10-CM | POA: Diagnosis not present

## 2015-08-26 ENCOUNTER — Other Ambulatory Visit: Payer: Self-pay | Admitting: Family Medicine

## 2015-08-26 DIAGNOSIS — M545 Low back pain: Secondary | ICD-10-CM | POA: Diagnosis not present

## 2015-08-27 ENCOUNTER — Other Ambulatory Visit: Payer: Self-pay

## 2015-08-27 DIAGNOSIS — E559 Vitamin D deficiency, unspecified: Secondary | ICD-10-CM

## 2015-08-27 DIAGNOSIS — M545 Low back pain: Secondary | ICD-10-CM | POA: Diagnosis not present

## 2015-08-27 MED ORDER — VITAMIN D (ERGOCALCIFEROL) 1.25 MG (50000 UNIT) PO CAPS: 50000.0000 [IU] | ORAL_CAPSULE | ORAL | Status: DC

## 2015-08-27 NOTE — Telephone Encounter (Signed)
Patient requesting refill. 

## 2015-08-28 ENCOUNTER — Other Ambulatory Visit: Payer: Self-pay | Admitting: Family Medicine

## 2015-08-28 DIAGNOSIS — M545 Low back pain: Secondary | ICD-10-CM | POA: Diagnosis not present

## 2015-08-29 DIAGNOSIS — M545 Low back pain: Secondary | ICD-10-CM | POA: Diagnosis not present

## 2015-08-30 DIAGNOSIS — M545 Low back pain: Secondary | ICD-10-CM | POA: Diagnosis not present

## 2015-08-31 DIAGNOSIS — M545 Low back pain: Secondary | ICD-10-CM | POA: Diagnosis not present

## 2015-09-01 DIAGNOSIS — M545 Low back pain: Secondary | ICD-10-CM | POA: Diagnosis not present

## 2015-09-02 DIAGNOSIS — M545 Low back pain: Secondary | ICD-10-CM | POA: Diagnosis not present

## 2015-09-03 DIAGNOSIS — M545 Low back pain: Secondary | ICD-10-CM | POA: Diagnosis not present

## 2015-09-04 DIAGNOSIS — M545 Low back pain: Secondary | ICD-10-CM | POA: Diagnosis not present

## 2015-09-05 DIAGNOSIS — M545 Low back pain: Secondary | ICD-10-CM | POA: Diagnosis not present

## 2015-09-06 DIAGNOSIS — M545 Low back pain: Secondary | ICD-10-CM | POA: Diagnosis not present

## 2015-09-07 DIAGNOSIS — M545 Low back pain: Secondary | ICD-10-CM | POA: Diagnosis not present

## 2015-09-08 DIAGNOSIS — M545 Low back pain: Secondary | ICD-10-CM | POA: Diagnosis not present

## 2015-09-09 DIAGNOSIS — M545 Low back pain: Secondary | ICD-10-CM | POA: Diagnosis not present

## 2015-09-14 ENCOUNTER — Other Ambulatory Visit: Payer: Self-pay

## 2015-09-14 DIAGNOSIS — I10 Essential (primary) hypertension: Secondary | ICD-10-CM

## 2015-09-14 MED ORDER — AMLODIPINE BESYLATE 10 MG PO TABS: 10.0000 mg | ORAL_TABLET | Freq: Every day | ORAL | Status: DC

## 2015-09-14 NOTE — Telephone Encounter (Signed)
Patient requesting refill. 

## 2015-09-17 ENCOUNTER — Other Ambulatory Visit: Payer: Self-pay

## 2015-09-17 DIAGNOSIS — I10 Essential (primary) hypertension: Secondary | ICD-10-CM

## 2015-09-17 DIAGNOSIS — K219 Gastro-esophageal reflux disease without esophagitis: Secondary | ICD-10-CM

## 2015-09-17 DIAGNOSIS — M545 Low back pain: Secondary | ICD-10-CM | POA: Diagnosis not present

## 2015-09-17 MED ORDER — AMLODIPINE BESYLATE 10 MG PO TABS
10.0000 mg | ORAL_TABLET | Freq: Every day | ORAL | Status: DC
Start: 2015-09-17 — End: 2015-11-14

## 2015-09-17 MED ORDER — OMEPRAZOLE 40 MG PO CPDR: 40.0000 mg | DELAYED_RELEASE_CAPSULE | Freq: Every day | ORAL | Status: DC

## 2015-09-17 NOTE — Telephone Encounter (Signed)
Patient requesting refill. 

## 2015-09-18 DIAGNOSIS — M545 Low back pain: Secondary | ICD-10-CM | POA: Diagnosis not present

## 2015-09-19 DIAGNOSIS — M545 Low back pain: Secondary | ICD-10-CM | POA: Diagnosis not present

## 2015-09-20 DIAGNOSIS — M545 Low back pain: Secondary | ICD-10-CM | POA: Diagnosis not present

## 2015-09-21 DIAGNOSIS — M545 Low back pain: Secondary | ICD-10-CM | POA: Diagnosis not present

## 2015-09-22 DIAGNOSIS — M545 Low back pain: Secondary | ICD-10-CM | POA: Diagnosis not present

## 2015-09-23 DIAGNOSIS — M545 Low back pain: Secondary | ICD-10-CM | POA: Diagnosis not present

## 2015-10-05 ENCOUNTER — Other Ambulatory Visit: Payer: Self-pay | Admitting: Family Medicine

## 2015-10-08 ENCOUNTER — Telehealth: Payer: Self-pay

## 2015-10-08 NOTE — Telephone Encounter (Signed)
Got a fax from the dail a nurse stating that this patient needed refills of her medication, but after they called the pharmacy they found that she had refills.

## 2015-10-24 ENCOUNTER — Other Ambulatory Visit: Payer: Self-pay | Admitting: Family Medicine

## 2015-10-24 NOTE — Telephone Encounter (Signed)
Patient requesting refill. 

## 2015-10-29 ENCOUNTER — Ambulatory Visit: Payer: Commercial Managed Care - HMO | Admitting: Family Medicine

## 2015-11-10 ENCOUNTER — Other Ambulatory Visit: Payer: Self-pay | Admitting: Family Medicine

## 2015-11-14 ENCOUNTER — Encounter: Payer: Self-pay | Admitting: Family Medicine

## 2015-11-14 ENCOUNTER — Ambulatory Visit (INDEPENDENT_AMBULATORY_CARE_PROVIDER_SITE_OTHER): Payer: Medicare Other | Admitting: Family Medicine

## 2015-11-14 VITALS — BP 138/82 | HR 64 | Temp 97.7°F | Resp 16 | Wt 304.1 lb

## 2015-11-14 DIAGNOSIS — I5030 Unspecified diastolic (congestive) heart failure: Secondary | ICD-10-CM

## 2015-11-14 DIAGNOSIS — L309 Dermatitis, unspecified: Secondary | ICD-10-CM

## 2015-11-14 DIAGNOSIS — E785 Hyperlipidemia, unspecified: Secondary | ICD-10-CM | POA: Diagnosis not present

## 2015-11-14 DIAGNOSIS — J3089 Other allergic rhinitis: Secondary | ICD-10-CM

## 2015-11-14 DIAGNOSIS — E1140 Type 2 diabetes mellitus with diabetic neuropathy, unspecified: Secondary | ICD-10-CM | POA: Diagnosis not present

## 2015-11-14 DIAGNOSIS — G4733 Obstructive sleep apnea (adult) (pediatric): Secondary | ICD-10-CM | POA: Diagnosis not present

## 2015-11-14 DIAGNOSIS — N184 Chronic kidney disease, stage 4 (severe): Secondary | ICD-10-CM

## 2015-11-14 DIAGNOSIS — E1122 Type 2 diabetes mellitus with diabetic chronic kidney disease: Secondary | ICD-10-CM

## 2015-11-14 DIAGNOSIS — Z794 Long term (current) use of insulin: Secondary | ICD-10-CM | POA: Diagnosis not present

## 2015-11-14 DIAGNOSIS — J302 Other seasonal allergic rhinitis: Secondary | ICD-10-CM

## 2015-11-14 DIAGNOSIS — I10 Essential (primary) hypertension: Secondary | ICD-10-CM

## 2015-11-14 DIAGNOSIS — J309 Allergic rhinitis, unspecified: Secondary | ICD-10-CM

## 2015-11-14 DIAGNOSIS — K219 Gastro-esophageal reflux disease without esophagitis: Secondary | ICD-10-CM | POA: Diagnosis not present

## 2015-11-14 LAB — POCT GLYCOSYLATED HEMOGLOBIN (HGB A1C): Hemoglobin A1C: 6.8

## 2015-11-14 MED ORDER — CLONIDINE HCL 0.3 MG/24HR TD PTWK: 0.3000 mg | MEDICATED_PATCH | TRANSDERMAL | Status: DC

## 2015-11-14 MED ORDER — PRAVASTATIN SODIUM 40 MG PO TABS: ORAL_TABLET | ORAL | Status: DC

## 2015-11-14 MED ORDER — METOPROLOL TARTRATE 100 MG PO TABS: 100.0000 mg | ORAL_TABLET | Freq: Two times a day (BID) | ORAL | Status: DC

## 2015-11-14 MED ORDER — GABAPENTIN 300 MG PO CAPS: 300.0000 mg | ORAL_CAPSULE | Freq: Three times a day (TID) | ORAL | Status: DC

## 2015-11-14 MED ORDER — FUROSEMIDE 20 MG PO TABS: 20.0000 mg | ORAL_TABLET | Freq: Two times a day (BID) | ORAL | Status: DC

## 2015-11-14 MED ORDER — PIMECROLIMUS 1 % EX CREA: TOPICAL_CREAM | Freq: Two times a day (BID) | CUTANEOUS | Status: DC

## 2015-11-14 MED ORDER — AMLODIPINE BESYLATE 10 MG PO TABS: 10.0000 mg | ORAL_TABLET | Freq: Every day | ORAL | Status: DC

## 2015-11-14 MED ORDER — MONTELUKAST SODIUM 10 MG PO TABS: 10.0000 mg | ORAL_TABLET | Freq: Every day | ORAL | Status: DC

## 2015-11-14 MED ORDER — OMEPRAZOLE 40 MG PO CPDR: 40.0000 mg | DELAYED_RELEASE_CAPSULE | Freq: Every day | ORAL | Status: DC

## 2015-11-14 NOTE — Progress Notes (Signed)
Name: Veronica Anderson   MRN: [id number]    DOB: [birth year]   Date:11/14/2015       Progress Note  Subjective  Chief Complaint  Chief Complaint  Patient presents with  . Medication Refill    4 month F/U  . Diabetes    patient checks her blood sugar BID. highest: 234 & lowest: 49.  . Gastroesophageal Reflux  . Hyperlipidemia  . Hypertension  . Sleep Apnea    HPI  DMII: no appetite, but she has been eating. She has gained weight since last visit. She is following a diabetic diet and glucose has been at goal, glucose around 90-100's fasting, but it drops to 60's once a week at most, and only happens when she skips meals.  She has been using Humalog 5 units before meals. She is due for eye exam - son will re-schedule her visit. Using Lantus 42 units daily.  She has retinopathy and needs to follow up with Ophthalmologist, she states neuropathy is under control with Gabapentin, she also has CKI and is not on ACE because of history of allergy.   HTN: she has been taking medication , no chest pain or palpitation. Complaint with medication and denies side effects  Hyperlipidemia: taking medication and denies side effects, including no myalgia  GERD: doing well at this time, nausea, no heartburn or regurgitation. Taking Omeprazole as prescribed  CKI: she was seen by nephrologist, and was advised to continue follow up, son has not made her a follow up appointment yet. She is due for lab work , urine micro 20 back in August 2016  OSA: uses CPAP every night, all night and is having supplemental oxygen through the CPAP machine. She has a rash on her face from the mask.   CHF: diastolic, she still has orthopnea, uses 3 pillows at night, using CPAP, no leg swelling, states she denies dyspnea.  On beta-blocker, hydralazine, not on ACE/ARB because of allergies   Patient Active Problem List   Diagnosis Date Noted  . Nocturnal oxygen desaturation 06/26/2015  . Anemia in chronic illness 02/23/2015  .  Asthma, mild intermittent 02/23/2015  . Benign essential HTN 02/23/2015  . Diastolic heart failure, NYHA class 3 ([place]) 02/23/2015  . Chronic kidney disease (CKD), stage IV (severe) ([place]) 02/23/2015  . CN (constipation) 02/23/2015  . Diabetes mellitus with neuropathy ([place]) 02/23/2015  . Dyslipidemia 02/23/2015  . Elevated ferritin 02/23/2015  . Gastro-esophageal reflux disease without esophagitis 02/23/2015  . LBP (low back pain) 02/23/2015  . Extreme obesity ([place]) 02/23/2015  . Obstructive apnea 02/23/2015  . Osteoarthritis 02/23/2015  . Neuropathy ([place]) 02/23/2015  . Perennial allergic rhinitis with seasonal variation 02/23/2015  . History of pneumonia 02/23/2015  . Vitamin D deficiency 02/23/2015  . Cataract 04/16/2010  . Background diabetic retinopathy ([place]) 12/18/2009    Past Surgical History  Procedure Laterality Date  . Ankle surgery Left     Family History  Problem Relation Age of Onset  . Diabetes type II      Social History   Social History  . Marital Status: Single    Spouse Name: N/A  . Number of Children: N/A  . Years of Education: N/A   Occupational History  . Not on file.   Social History Main Topics  . Smoking status: Former Smoker    Types: Cigarettes    Quit date: 07/14/1994  . Smokeless tobacco: Not on file  . Alcohol Use: No  . Drug Use: No  . Sexual Activity:  Not Currently   Other Topics Concern  . Not on file   Social History Narrative     Current outpatient prescriptions:  .  ACCU-CHEK AVIVA PLUS test strip, CHECK BLOOD SUGAR TWICE DAILY, Disp: 200 each, Rfl: 3 .  amLODipine (NORVASC) 10 MG tablet, Take 1 tablet (10 mg total) by mouth daily., Disp: 90 tablet, Rfl: 2 .  aspirin EC 81 MG tablet, Take 81 mg by mouth at bedtime., Disp: , Rfl:  .  cloNIDine (CATAPRES-TTS-3) 0.3 mg/24hr patch, Place 1 patch (0.3 mg total) onto the skin once a week. Pt applies patch on Sunday., Disp: 12 patch, Rfl: 2 .  fluticasone (FLONASE) 50 MCG/ACT  nasal spray, USE 2 SPRAYS IN EACH NOSTRIL EVERY NIGHT AT BEDTIME, Disp: 17 g, Rfl: 2 .  furosemide (LASIX) 20 MG tablet, Take 1 tablet (20 mg total) by mouth 2 (two) times daily., Disp: 180 tablet, Rfl: 2 .  gabapentin (NEURONTIN) 300 MG capsule, Take 1 capsule (300 mg total) by mouth 3 (three) times daily., Disp: 270 capsule, Rfl: 2 .  hydrALAZINE (APRESOLINE) 50 MG tablet, TAKE 1 TABLET BY MOUTH 3 TIMES A DAY, Disp: 90 tablet, Rfl: 5 .  insulin lispro (HUMALOG) 100 UNIT/ML injection, Inject 5 Units into the skin 3 (three) times daily with meals., Disp: , Rfl:  .  LANTUS SOLOSTAR 100 UNIT/ML Solostar Pen, INJECT 50 UNITS SUBCUTANEOUSLY DAILY, Disp: 5 pen, Rfl: 5 .  loratadine (CLARITIN) 10 MG tablet, TAKE 1 TABLET BY MOUTH EVERY DAY FOR ALLERGIES, Disp: 30 tablet, Rfl: 5 .  metoprolol (LOPRESSOR) 100 MG tablet, Take 1 tablet (100 mg total) by mouth 2 (two) times daily., Disp: 180 tablet, Rfl: 2 .  montelukast (SINGULAIR) 10 MG tablet, Take 1 tablet (10 mg total) by mouth daily., Disp: 90 tablet, Rfl: 2 .  omeprazole (PRILOSEC) 40 MG capsule, Take 1 capsule (40 mg total) by mouth daily., Disp: 90 capsule, Rfl: 2 .  pravastatin (PRAVACHOL) 40 MG tablet, TAKE 1 TABLET BY MOUTH ONCE A DAY FOR CHOLESTEROL, Disp: 90 tablet, Rfl: 2 .  Vitamin D, Ergocalciferol, (DRISDOL) 50000 units CAPS capsule, Take 1 capsule (50,000 Units total) by mouth once a week., Disp: 4 capsule, Rfl: 4 .  nystatin (MYCOSTATIN/NYSTOP) 100000 UNIT/GM POWD, Apply 1 Bottle topically 3 (three) times daily., Disp: 1 Bottle, Rfl: 0  Allergies  Allergen Reactions  . Ace Inhibitors Cough     ROS  Constitutional: Negative for fever , positive  weight change. Picky eater, eats well when son cooks or when eating out.  Respiratory: Negative for cough , mild  shortness of breath with activity .   Cardiovascular: Negative for chest pain or palpitations.  Gastrointestinal: Negative for abdominal pain, no bowel changes.  Musculoskeletal:  Positive  for gait problem ( uses a cane at home and wheelchair when going long distance) no  joint swelling.  Skin: Positive  for rash.  Neurological: Negative for dizziness or headache.  No other specific complaints in a complete review of systems (except as listed in HPI above).  Objective  Filed Vitals:   11/14/15 0908  BP: 138/82  Pulse: 64  Temp: 97.7 F (36.5 C)  TempSrc: Oral  Resp: 16  Weight: 304 lb 1.6 oz (137.939 kg)  SpO2: 94%    Body mass index is 52.17 kg/(m^2).  Physical Exam  Constitutional: Patient appears well-developed and well-nourished. Obese  No distress.  HEENT: head atraumatic, normocephalic, pupils equal and reactive to light,  neck supple, throat within normal  limits Cardiovascular: Normal rate, regular rhythm and normal heart sounds.  No murmur heard. Trace of  BLE edema. Pulmonary/Chest: Effort normal and breath sounds normal. No respiratory distress. Abdominal: Soft.  There is no tenderness. Psychiatric: Patient has a normal mood and affect. behavior is normal. Judgment and thought content normal. Skin: rash on the area of CPAP on her face  Recent Results (from the past 2160 hour(s))  POCT glycosylated hemoglobin (Hb A1C)     Status: Abnormal   Collection Time: 11/14/15  9:27 AM  Result Value Ref Range   Hemoglobin A1C 6.8      PHQ2/9: Depression screen [facility] 2/9 11/14/2015 06/26/2015 02/26/2015  Decreased Interest 0 0 0  Down, Depressed, Hopeless 0 0 0  PHQ - 2 Score 0 0 0    Fall Risk: Fall Risk  11/14/2015 06/26/2015 02/26/2015  Falls in the past year? No No No    Functional Status Survey: Is the patient deaf or have difficulty hearing?: No Does the patient have difficulty seeing, even when wearing glasses/contacts?: Yes Does the patient have difficulty concentrating, remembering, or making decisions?: No Does the patient have difficulty walking or climbing stairs?: Yes Does the patient have difficulty dressing or bathing?: Yes Does  the patient have difficulty doing errands alone such as visiting a doctor's office or shopping?: Yes    Assessment & Plan  1. Type 2 diabetes mellitus with diabetic neuropathy, with long-term current use of insulin (HCC)  - POCT glycosylated hemoglobin (Hb A1C) - gabapentin (NEURONTIN) 300 MG capsule; Take 1 capsule (300 mg total) by mouth 3 (three) times daily.  Dispense: 270 capsule; Refill: 2  2. Obstructive apnea  Continue CPAP machine  3. Diastolic heart failure, NYHA class 3 (HCC)  - furosemide (LASIX) 20 MG tablet; Take 1 tablet (20 mg total) by mouth 2 (two) times daily.  Dispense: 180 tablet; Refill: 2  4. Essential hypertension  - amLODipine (NORVASC) 10 MG tablet; Take 1 tablet (10 mg total) by mouth daily.  Dispense: 90 tablet; Refill: 2 - cloNIDine (CATAPRES-TTS-3) 0.3 mg/24hr patch; Place 1 patch (0.3 mg total) onto the skin once a week. Pt applies patch on Sunday.  Dispense: 12 patch; Refill: 2 - metoprolol (LOPRESSOR) 100 MG tablet; Take 1 tablet (100 mg total) by mouth 2 (two) times daily.  Dispense: 180 tablet; Refill: 2 - Comprehensive metabolic panel  5. Chronic kidney disease (CKD), stage IV (severe) (HCC)  - VITAMIN D 25 Hydroxy (Vit-D Deficiency, Fractures) - Parathyroid hormone, intact (no Ca) - CBC with Differential/Platelet - Phosphorus  6. Type 2 diabetes mellitus with stage 4 chronic kidney disease, with long-term current use of insulin ([place])   7. Perennial allergic rhinitis with seasonal variation  - montelukast (SINGULAIR) 10 MG tablet; Take 1 tablet (10 mg total) by mouth daily.  Dispense: 90 tablet; Refill: 2  8. Gastro-esophageal reflux disease without esophagitis  - omeprazole (PRILOSEC) 40 MG capsule; Take 1 capsule (40 mg total) by mouth daily.  Dispense: 90 capsule; Refill: 2  9. Dyslipidemia  - pravastatin (PRAVACHOL) 40 MG tablet; TAKE 1 TABLET BY MOUTH ONCE A DAY FOR CHOLESTEROL  Dispense: 90 tablet; Refill: 2 - Lipid panel

## 2015-11-15 LAB — COMPREHENSIVE METABOLIC PANEL
ALBUMIN: 3.7 g/dL (ref 3.6–4.8)
ALT: 12 IU/L (ref 0–32)
AST: 15 IU/L (ref 0–40)
Albumin/Globulin Ratio: 0.9 — ABNORMAL LOW (ref 1.2–2.2)
Alkaline Phosphatase: 116 IU/L (ref 39–117)
BILIRUBIN TOTAL: 0.4 mg/dL (ref 0.0–1.2)
BUN / CREAT RATIO: 18 (ref 12–28)
BUN: 31 mg/dL — AB (ref 8–27)
CALCIUM: 9.1 mg/dL (ref 8.7–10.3)
CHLORIDE: 100 mmol/L (ref 96–106)
CO2: 27 mmol/L (ref 18–29)
CREATININE: 1.68 mg/dL — AB (ref 0.57–1.00)
GFR, EST AFRICAN AMERICAN: 35 mL/min/{1.73_m2} — AB (ref >59)
GFR, EST NON AFRICAN AMERICAN: 31 mL/min/{1.73_m2} — AB (ref >59)
GLUCOSE: 176 mg/dL — AB (ref 65–99)
Globulin, Total: 3.9 g/dL (ref 1.5–4.5)
Potassium: 4.6 mmol/L (ref 3.5–5.2)
Sodium: 145 mmol/L — ABNORMAL HIGH (ref 134–144)
TOTAL PROTEIN: 7.6 g/dL (ref 6.0–8.5)

## 2015-11-15 LAB — CBC WITH DIFFERENTIAL/PLATELET
Basophils Absolute: 0 10*3/uL (ref 0.0–0.2)
Basos: 0 %
EOS (ABSOLUTE): 0 10*3/uL (ref 0.0–0.4)
EOS: 0 %
HEMOGLOBIN: 11.8 g/dL (ref 11.1–15.9)
Hematocrit: 38 % (ref 34.0–46.6)
IMMATURE GRANS (ABS): 0 10*3/uL (ref 0.0–0.1)
IMMATURE GRANULOCYTES: 0 %
LYMPHS: 13 %
Lymphocytes Absolute: 1.5 10*3/uL (ref 0.7–3.1)
MCH: 23.1 pg — ABNORMAL LOW (ref 26.6–33.0)
MCHC: 31.1 g/dL — ABNORMAL LOW (ref 31.5–35.7)
MCV: 75 fL — AB (ref 79–97)
MONOCYTES: 4 %
Monocytes Absolute: 0.5 10*3/uL (ref 0.1–0.9)
Neutrophils Absolute: 9.6 10*3/uL — ABNORMAL HIGH (ref 1.4–7.0)
Neutrophils: 83 %
Platelets: 328 10*3/uL (ref 150–379)
RBC: 5.1 x10E6/uL (ref 3.77–5.28)
RDW: 15.3 % (ref 12.3–15.4)
WBC: 11.6 10*3/uL — AB (ref 3.4–10.8)

## 2015-11-15 LAB — LIPID PANEL
Chol/HDL Ratio: 2.8 ratio units (ref 0.0–4.4)
Cholesterol, Total: 116 mg/dL (ref 100–199)
HDL: 41 mg/dL (ref >39)
LDL Calculated: 59 mg/dL (ref 0–99)
Triglycerides: 79 mg/dL (ref 0–149)
VLDL Cholesterol Cal: 16 mg/dL (ref 5–40)

## 2015-11-15 LAB — PARATHYROID HORMONE, INTACT (NO CA): PTH: 43 pg/mL (ref 15–65)

## 2015-11-15 LAB — VITAMIN D 25 HYDROXY (VIT D DEFICIENCY, FRACTURES): Vit D, 25-Hydroxy: 51 ng/mL (ref 30.0–100.0)

## 2015-11-15 LAB — PHOSPHORUS: Phosphorus: 3.4 mg/dL (ref 2.5–4.5)

## 2015-11-19 ENCOUNTER — Other Ambulatory Visit: Payer: Self-pay | Admitting: Family Medicine

## 2015-11-19 NOTE — Telephone Encounter (Signed)
Patient requesting refill. 

## 2015-11-20 DIAGNOSIS — H2589 Other age-related cataract: Secondary | ICD-10-CM | POA: Diagnosis not present

## 2015-11-28 ENCOUNTER — Other Ambulatory Visit: Payer: Self-pay | Admitting: Family Medicine

## 2015-12-07 ENCOUNTER — Encounter: Payer: Self-pay | Admitting: Family Medicine

## 2015-12-13 DIAGNOSIS — H2513 Age-related nuclear cataract, bilateral: Secondary | ICD-10-CM | POA: Diagnosis not present

## 2015-12-18 ENCOUNTER — Encounter: Payer: Self-pay | Admitting: *Deleted

## 2015-12-20 ENCOUNTER — Ambulatory Visit: Payer: Medicare Other | Admitting: Anesthesiology

## 2015-12-20 ENCOUNTER — Encounter
Admission: RE | Disposition: A | Discharge status: Home / Self Care | Payer: Self-pay | Source: Ambulatory Visit | Attending: Ophthalmology

## 2015-12-20 ENCOUNTER — Ambulatory Visit
Admission: RE | Admit: 2015-12-20 | Discharge: 2015-12-20 | Disposition: A | Discharge status: Home / Self Care | Payer: Medicare Other | Source: Ambulatory Visit | Attending: Ophthalmology | Admitting: Ophthalmology

## 2015-12-20 ENCOUNTER — Encounter: Payer: Self-pay | Admitting: *Deleted

## 2015-12-20 DIAGNOSIS — E785 Hyperlipidemia, unspecified: Secondary | ICD-10-CM | POA: Insufficient documentation

## 2015-12-20 DIAGNOSIS — G4730 Sleep apnea, unspecified: Secondary | ICD-10-CM | POA: Diagnosis not present

## 2015-12-20 DIAGNOSIS — E559 Vitamin D deficiency, unspecified: Secondary | ICD-10-CM | POA: Insufficient documentation

## 2015-12-20 DIAGNOSIS — K219 Gastro-esophageal reflux disease without esophagitis: Secondary | ICD-10-CM | POA: Diagnosis not present

## 2015-12-20 DIAGNOSIS — E1122 Type 2 diabetes mellitus with diabetic chronic kidney disease: Secondary | ICD-10-CM | POA: Diagnosis not present

## 2015-12-20 DIAGNOSIS — H2513 Age-related nuclear cataract, bilateral: Secondary | ICD-10-CM | POA: Diagnosis not present

## 2015-12-20 DIAGNOSIS — Z8673 Personal history of transient ischemic attack (TIA), and cerebral infarction without residual deficits: Secondary | ICD-10-CM | POA: Insufficient documentation

## 2015-12-20 DIAGNOSIS — Z862 Personal history of diseases of the blood and blood-forming organs and certain disorders involving the immune mechanism: Secondary | ICD-10-CM | POA: Diagnosis not present

## 2015-12-20 DIAGNOSIS — I252 Old myocardial infarction: Secondary | ICD-10-CM | POA: Diagnosis not present

## 2015-12-20 DIAGNOSIS — Z87891 Personal history of nicotine dependence: Secondary | ICD-10-CM | POA: Diagnosis not present

## 2015-12-20 DIAGNOSIS — N184 Chronic kidney disease, stage 4 (severe): Secondary | ICD-10-CM | POA: Diagnosis not present

## 2015-12-20 DIAGNOSIS — R0602 Shortness of breath: Secondary | ICD-10-CM | POA: Insufficient documentation

## 2015-12-20 DIAGNOSIS — M1990 Unspecified osteoarthritis, unspecified site: Secondary | ICD-10-CM | POA: Diagnosis not present

## 2015-12-20 DIAGNOSIS — Z794 Long term (current) use of insulin: Secondary | ICD-10-CM | POA: Diagnosis not present

## 2015-12-20 DIAGNOSIS — G4733 Obstructive sleep apnea (adult) (pediatric): Secondary | ICD-10-CM | POA: Insufficient documentation

## 2015-12-20 DIAGNOSIS — I2510 Atherosclerotic heart disease of native coronary artery without angina pectoris: Secondary | ICD-10-CM | POA: Insufficient documentation

## 2015-12-20 DIAGNOSIS — H2511 Age-related nuclear cataract, right eye: Secondary | ICD-10-CM | POA: Diagnosis not present

## 2015-12-20 DIAGNOSIS — J45909 Unspecified asthma, uncomplicated: Secondary | ICD-10-CM | POA: Diagnosis not present

## 2015-12-20 DIAGNOSIS — E119 Type 2 diabetes mellitus without complications: Secondary | ICD-10-CM | POA: Diagnosis not present

## 2015-12-20 DIAGNOSIS — I130 Hypertensive heart and chronic kidney disease with heart failure and stage 1 through stage 4 chronic kidney disease, or unspecified chronic kidney disease: Secondary | ICD-10-CM | POA: Insufficient documentation

## 2015-12-20 DIAGNOSIS — E669 Obesity, unspecified: Secondary | ICD-10-CM | POA: Diagnosis not present

## 2015-12-20 DIAGNOSIS — I509 Heart failure, unspecified: Secondary | ICD-10-CM | POA: Insufficient documentation

## 2015-12-20 DIAGNOSIS — I10 Essential (primary) hypertension: Secondary | ICD-10-CM | POA: Diagnosis not present

## 2015-12-20 HISTORY — DX: Polyneuropathy, unspecified: G62.9

## 2015-12-20 HISTORY — DX: Cerebral infarction, unspecified: I63.9

## 2015-12-20 HISTORY — PX: CATARACT EXTRACTION W/PHACO: SHX586

## 2015-12-20 LAB — GLUCOSE, CAPILLARY: Glucose-Capillary: 152 mg/dL — ABNORMAL HIGH (ref 65–99)

## 2015-12-20 SURGERY — PHACOEMULSIFICATION, CATARACT, WITH IOL INSERTION
Anesthesia: Monitor Anesthesia Care | Site: Eye | Laterality: Right | Wound class: Clean

## 2015-12-20 MED ORDER — TRYPAN BLUE 0.06 % OP SOLN
OPHTHALMIC | Status: AC
  Filled 2015-12-20: qty 0.5

## 2015-12-20 MED ORDER — BSS IO SOLN
INTRAOCULAR | Status: DC | PRN
  Administered 2015-12-20: 09:00:00 via OPHTHALMIC

## 2015-12-20 MED ORDER — MIDAZOLAM HCL 2 MG/2ML IJ SOLN
INTRAMUSCULAR | Status: DC | PRN
  Administered 2015-12-20: 1 mg via INTRAVENOUS

## 2015-12-20 MED ORDER — NA HYALUR & NA CHOND-NA HYALUR 0.55-0.5 ML IO KIT
PACK | INTRAOCULAR | Status: DC | PRN
  Administered 2015-12-20: 1 via OPHTHALMIC

## 2015-12-20 MED ORDER — TETRACAINE HCL 0.5 % OP SOLN
1.0000 [drp] | Freq: Once | OPHTHALMIC | Status: AC
  Administered 2015-12-20: 1 [drp] via OPHTHALMIC

## 2015-12-20 MED ORDER — FENTANYL CITRATE (PF) 100 MCG/2ML IJ SOLN
INTRAMUSCULAR | Status: DC | PRN
  Administered 2015-12-20: 50 ug via INTRAVENOUS

## 2015-12-20 MED ORDER — POVIDONE-IODINE 5 % OP SOLN
1.0000 "application " | Freq: Once | OPHTHALMIC | Status: AC
  Administered 2015-12-20: 1 via OPHTHALMIC

## 2015-12-20 MED ORDER — ONDANSETRON HCL 4 MG/2ML IJ SOLN
INTRAMUSCULAR | Status: DC | PRN
  Administered 2015-12-20: 4 mg via INTRAVENOUS

## 2015-12-20 MED ORDER — POVIDONE-IODINE 5 % OP SOLN
OPHTHALMIC | Status: AC
  Administered 2015-12-20: 1 via OPHTHALMIC
  Filled 2015-12-20: qty 30

## 2015-12-20 MED ORDER — NEOMYCIN-POLYMYXIN-DEXAMETH 3.5-10000-0.1 OP OINT
TOPICAL_OINTMENT | OPHTHALMIC | Status: AC
  Filled 2015-12-20: qty 3.5

## 2015-12-20 MED ORDER — TETRACAINE HCL 0.5 % OP SOLN
OPHTHALMIC | Status: AC
  Administered 2015-12-20: 1 [drp] via OPHTHALMIC
  Filled 2015-12-20: qty 2

## 2015-12-20 MED ORDER — ARMC OPHTHALMIC DILATING GEL
1.0000 "application " | OPHTHALMIC | Status: AC | PRN
  Administered 2015-12-20 (×2): 1 via OPHTHALMIC

## 2015-12-20 MED ORDER — NA HYALUR & NA CHOND-NA HYALUR 0.55-0.5 ML IO KIT
PACK | INTRAOCULAR | Status: AC
  Filled 2015-12-20: qty 1.05

## 2015-12-20 MED ORDER — MOXIFLOXACIN HCL 0.5 % OP SOLN
OPHTHALMIC | Status: DC | PRN
  Administered 2015-12-20: 1 [drp] via OPHTHALMIC

## 2015-12-20 MED ORDER — LIDOCAINE HCL (PF) 4 % IJ SOLN
INTRAMUSCULAR | Status: AC
  Filled 2015-12-20: qty 5

## 2015-12-20 MED ORDER — MOXIFLOXACIN HCL 0.5 % OP SOLN
OPHTHALMIC | Status: AC
  Filled 2015-12-20: qty 3

## 2015-12-20 MED ORDER — EPINEPHRINE HCL 1 MG/ML IJ SOLN
INTRAMUSCULAR | Status: AC
  Filled 2015-12-20: qty 1

## 2015-12-20 MED ORDER — LIDOCAINE HCL (PF) 4 % IJ SOLN
INTRAMUSCULAR | Status: DC | PRN
  Administered 2015-12-20: 09:00:00 via OPHTHALMIC

## 2015-12-20 MED ORDER — SODIUM CHLORIDE 0.9 % IV SOLN
INTRAVENOUS | Status: DC
  Administered 2015-12-20 (×2): via INTRAVENOUS

## 2015-12-20 MED ORDER — TRYPAN BLUE 0.06 % OP SOLN
OPHTHALMIC | Status: DC | PRN
  Administered 2015-12-20: 0.5 mL via INTRAOCULAR

## 2015-12-20 MED ORDER — CEFUROXIME OPHTHALMIC INJECTION 1 MG/0.1 ML
INJECTION | OPHTHALMIC | Status: DC | PRN
  Administered 2015-12-20: 0.1 mL via INTRACAMERAL

## 2015-12-20 MED ORDER — CEFUROXIME OPHTHALMIC INJECTION 1 MG/0.1 ML
INJECTION | OPHTHALMIC | Status: AC
  Filled 2015-12-20: qty 0.1

## 2015-12-20 MED ORDER — MOXIFLOXACIN HCL 0.5 % OP SOLN: 1.0000 [drp] | OPHTHALMIC | Status: DC | PRN

## 2015-12-20 MED ORDER — ARMC OPHTHALMIC DILATING GEL
OPHTHALMIC | Status: AC
  Administered 2015-12-20: 1 via OPHTHALMIC
  Filled 2015-12-20: qty 0.25

## 2015-12-20 SURGICAL SUPPLY — 24 items
CANNULA ANT/CHMB 27GA (MISCELLANEOUS) ×9 IMPLANT
CUP MEDICINE 2OZ PLAST GRAD ST (MISCELLANEOUS) ×3 IMPLANT
FILTER MILLEX .045 (MISCELLANEOUS) ×1 IMPLANT
FLTR MILLEX .045 (MISCELLANEOUS) ×3
GLOVE BIO SURGEON STRL SZ8 (GLOVE) ×3 IMPLANT
GLOVE BIOGEL M 6.5 STRL (GLOVE) ×3 IMPLANT
GLOVE SURG LX 7.5 STRW (GLOVE) ×2
GLOVE SURG LX STRL 7.5 STRW (GLOVE) ×1 IMPLANT
GOWN STRL REUS W/ TWL LRG LVL3 (GOWN DISPOSABLE) ×2 IMPLANT
GOWN STRL REUS W/TWL LRG LVL3 (GOWN DISPOSABLE) ×4
LENS IOL ACRSF IQ PC 19.0 (Intraocular Lens) ×1 IMPLANT
LENS IOL ACRYSOF IQ POST 19.0 (Intraocular Lens) ×3 IMPLANT
PACK CATARACT (MISCELLANEOUS) ×3 IMPLANT
PACK CATARACT BRASINGTON LX (MISCELLANEOUS) ×3 IMPLANT
PACK EYE AFTER SURG (MISCELLANEOUS) ×3 IMPLANT
SOL BSS BAG (MISCELLANEOUS) ×3
SOL PREP PVP 2OZ (MISCELLANEOUS) ×3
SOLUTION BSS BAG (MISCELLANEOUS) ×1 IMPLANT
SOLUTION PREP PVP 2OZ (MISCELLANEOUS) ×1 IMPLANT
SYR 3ML LL SCALE MARK (SYRINGE) ×3 IMPLANT
SYR 5ML LL (SYRINGE) ×3 IMPLANT
SYR TB 1ML 27GX1/2 LL (SYRINGE) ×3 IMPLANT
WATER STERILE IRR 1000ML POUR (IV SOLUTION) ×3 IMPLANT
WIPE NON LINTING 3.25X3.25 (MISCELLANEOUS) ×3 IMPLANT

## 2015-12-20 NOTE — Anesthesia Preprocedure Evaluation (Signed)
Anesthesia Evaluation  Patient identified by MRN, date of birth, ID band Patient awake    Reviewed: Allergy & Precautions, H&P , NPO status , Patient's Chart, lab work & pertinent test results  History of Anesthesia Complications Negative for: history of anesthetic complications  Airway Mallampati: III  TM Distance: <3 FB Neck ROM: limited    Dental  (+) Poor Dentition, Chipped, Missing, Loose   Pulmonary shortness of breath, asthma , sleep apnea , former smoker,    Pulmonary exam normal breath sounds clear to auscultation       Cardiovascular Exercise Tolerance: Poor hypertension, (-) angina+ CAD, + Past MI, +CHF and + DOE  negative cardio ROS Normal cardiovascular exam Rhythm:regular Rate:Normal     Neuro/Psych CVA, Residual Symptoms negative psych ROS   GI/Hepatic Neg liver ROS, GERD  Controlled,  Endo/Other  diabetes, Type 2, Insulin Dependent  Renal/GU Renal disease  negative genitourinary   Musculoskeletal  (+) Arthritis ,   Abdominal   Peds  Hematology negative hematology ROS (+)   Anesthesia Other Findings Past Medical History:   GERD (gastroesophageal reflux disease)                       Left ankle pain                                              Requires supplemental oxygen                                 Obesity                                                      Vitamin D deficiency                                         Diabetes mellitus without complication (HCC)                 Anemia                                                       Hyperlipidemia                                               Hypertension                                                 CHF (NYHA class II, ACC/AHA stage C) ([place])                   Allergy  Constipation                                                 Lumbago                                                       Asthma                                                       Protein calorie malnutrition (HCC)                           OA (osteoarthritis)                                          Paresthesia                                                    Comment:Foot   Cataract                                                       Comment:bilateral   Obstructive sleep apnea syndrome                               Comment:CPAP   Stroke (HCC)                                                 Neuropathy (HCC)                                             Chronic kidney disease                                         Comment:stage IV (severe), Dr. Aleene Davidson  Past Surgical History:   ANKLE SURGERY                                   Left              COLONOSCOPY  FRACTURE SURGERY                                Left                Comment:ankle  BMI    Body Mass Index   51.64 kg/m 2      Reproductive/Obstetrics negative OB ROS                             Anesthesia Physical Anesthesia Plan  ASA: IV  Anesthesia Plan: MAC   Post-op Pain Management:    Induction:   Airway Management Planned:   Additional Equipment:   Intra-op Plan:   Post-operative Plan:   Informed Consent: I have reviewed the patients History and Physical, chart, labs and discussed the procedure including the risks, benefits and alternatives for the proposed anesthesia with the patient or authorized representative who has indicated his/her understanding and acceptance.   Dental Advisory Given  Plan Discussed with: Anesthesiologist, CRNA and Surgeon  Anesthesia Plan Comments:         Anesthesia Quick Evaluation

## 2015-12-20 NOTE — OR Nursing (Signed)
Dr. Janett Billow anesthesia notified of HR of 57 BPM. He does not want patient to have her metoprolol this am.

## 2015-12-20 NOTE — Anesthesia Procedure Notes (Signed)
Date/Time: 12/20/2015 8:57 AM Performed by: Doreen Salvage Pre-anesthesia Checklist: Patient identified, Emergency Drugs available, Suction available, Patient being monitored and Timeout performed Oxygen Delivery Method: Nasal cannula

## 2015-12-20 NOTE — H&P (Signed)
  The History and Physical notes are on paper, have been signed, and are to be scanned. The patient remains stable and unchanged from the H&P.   Previous H&P reviewed, patient examined, and there are no changes.  Benay Pillow 12/20/2015 8:42 AM

## 2015-12-20 NOTE — Op Note (Signed)
OPERATIVE NOTE  Veronica Anderson [id number] 12/20/2015   PREOPERATIVE DIAGNOSIS:  Mature Nuclear sclerotic cataract right eye.  H25.11   POSTOPERATIVE DIAGNOSIS:    Mature Nuclear sclerotic cataract right eye.     PROCEDURE:  Phacoemusification with posterior chamber intraocular lens placement of the right eye, complex requiring trypan blue for visualization of the anterior capsule.  LENS:   Implant Name Type Inv. Item Serial No. Manufacturer Lot No. LRB No. Used  IMPLANT LENS - [id number] Intraocular Lens IMPLANT LENS [id number] ALCON   Right 1       [id number] 19.0   ULTRASOUND TIME: 2 minutes 50 seconds.  CDE 37.04   SURGEON:  Benay Pillow, MD, MPH  ANESTHESIOLOGIST: Anesthesiologist: Andria Frames, MD CRNA: Doreen Salvage, CRNA   ANESTHESIA:  Topical with tetracaine drops and 2% Xylocaine jelly, augmented with 1% preservative-free intracameral lidocaine.  ESTIMATED BLOOD LOSS: less than 1 mL.   COMPLICATIONS:  None.   DESCRIPTION OF PROCEDURE:  The patient was identified in the holding room and transported to the operating room and placed in the supine position under the operating microscope.  The right eye was identified as the operative eye and it was prepped and draped in the usual sterile ophthalmic fashion.    A 1.0 millimeter clear-corneal paracentesis was made at the 10:30 position. 0.5 ml of preservative-free 1% lidocaine with epinephrine was injected into the anterior chamber.  Vision blue was instilled in the standard fashion and rinsed out with BSS.  The anterior chamber was filled with Viscoat viscoelastic.  A 2.4 millimeter keratome was used to make a near-clear corneal incision at the 8:00 position.  A curvilinear capsulorrhexis was made with a cystotome and capsulorrhexis forceps.  Balanced salt solution was used to hydrodissect and hydrodelineate the nucleus.  The lens was extremely dense and there was a fibrotic posterior plate which made the case challenging  but there were no complications.   Phacoemulsification was then used in stop and chop fashion to remove the lens nucleus and epinucleus.  The remaining cortex was then removed using the irrigation and aspiration handpiece. Provisc was then placed into the capsular bag to distend it for lens placement.  A lens was then injected into the capsular bag.  The remaining viscoelastic was aspirated.   Wounds were hydrated with balanced salt solution.  The anterior chamber was inflated to a physiologic pressure with balanced salt solution. No wound leaks were noted.  Topical Vigamox drops were applied to the eye.  The patient was taken to the recovery room in stable condition without complications of anesthesia or surgery  Benay Pillow 12/20/2015, 9:31 AM

## 2015-12-20 NOTE — Discharge Instructions (Signed)
Eye Surgery Discharge Instructions  Expect mild scratchy sensation or mild soreness. DO NOT RUB YOUR EYE!  The day of surgery:  Minimal physical activity, but bed rest is not required  No reading, computer work, or close hand work  No bending, lifting, or straining.  May watch TV  For 24 hours:  No driving, legal decisions, or alcoholic beverages  Safety precautions  Eat anything you prefer: It is better to start with liquids, then soup then solid foods.  _____ Eye patch should be worn until postoperative exam tomorrow.  ____ Solar shield eyeglasses should be worn for comfort in the sunlight/patch while sleeping  Resume all regular medications including aspirin or Coumadin if these were discontinued prior to surgery. You may shower, bathe, shave, or wash your hair. Tylenol may be taken for mild discomfort.  Call your doctor if you experience significant pain, nausea, or vomiting, fever > 101 or other signs of infection. [phone] or [phone] Specific instructions:  Follow-up Information    Follow up with Benay Pillow, MD.   Specialty:  Ophthalmology   Why:  follow up 6/9 at [place] information:   [place] [address] [phone]     AMBULATORY SURGERY  DISCHARGE INSTRUCTIONS   1) The drugs that you were given will stay in your system until tomorrow so for the next 24 hours you should not:  A) Drive an automobile B) Make any legal decisions C) Drink any alcoholic beverage   2) You may resume regular meals tomorrow.  Today it is better to start with liquids and gradually work up to solid foods.  You may eat anything you prefer, but it is better to start with liquids, then soup and crackers, and gradually work up to solid foods.   3) Please notify your doctor immediately if you have any unusual bleeding, trouble breathing, redness and pain at the surgery site, drainage, fever, or pain not relieved by  medication.    4) Additional Instructions:        Please contact your physician with any problems or Same Day Surgery at [phone], Monday through Friday 6 am to 4 pm, or [place] at [facility] number at [phone].

## 2015-12-20 NOTE — Transfer of Care (Signed)
Immediate Anesthesia Transfer of Care Note  Patient: [name]  Procedure(s) Performed: Procedure(s) with comments: CATARACT EXTRACTION PHACO AND INTRAOCULAR LENS PLACEMENT (IOC) (Right) - [place] 2.50 AP% 21.8 CDE 37.04 Fluid pack lot # [id number]  Patient Location: PACU  Anesthesia Type:Spinal  Level of Consciousness: awake, alert  and oriented  Airway & Oxygen Therapy: Patient Spontanous Breathing and Patient connected to face mask oxygen  Post-op Assessment: Report given to RN and Post -op Vital signs reviewed and stable  Post vital signs: Reviewed and stable  Last Vitals:  Filed Vitals:   12/20/15 0728 12/20/15 0935  BP: 183/55 113/85  Pulse: 57   Temp: 36.8 C 36.7 C  Resp: 18 14    Complications: No apparent anesthesia complications

## 2015-12-20 NOTE — Anesthesia Postprocedure Evaluation (Signed)
Anesthesia Post Note  Patient: Veronica Anderson  Procedure(s) Performed: Procedure(s) (LRB): CATARACT EXTRACTION PHACO AND INTRAOCULAR LENS PLACEMENT (IOC) (Right)  Patient location during evaluation: Other Anesthesia Type: MAC Level of consciousness: awake and alert Pain management: pain level controlled Vital Signs Assessment: post-procedure vital signs reviewed and stable Respiratory status: spontaneous breathing, nonlabored ventilation, respiratory function stable and patient connected to nasal cannula oxygen Cardiovascular status: stable and blood pressure returned to baseline Anesthetic complications: no    Last Vitals:  Filed Vitals:   12/20/15 0728 12/20/15 0935  BP: 183/55 113/85  Pulse: 57   Temp: 36.8 C 36.7 C  Resp: 18 14    Last Pain:  Filed Vitals:   12/20/15 0935  PainSc: 0-No pain                 Alison Stalling

## 2016-02-05 ENCOUNTER — Telehealth: Payer: Self-pay | Admitting: Family Medicine

## 2016-02-05 NOTE — Telephone Encounter (Signed)
okay

## 2016-02-07 DIAGNOSIS — H2512 Age-related nuclear cataract, left eye: Secondary | ICD-10-CM | POA: Diagnosis not present

## 2016-02-11 ENCOUNTER — Encounter: Payer: Self-pay | Admitting: *Deleted

## 2016-02-12 ENCOUNTER — Other Ambulatory Visit: Payer: Self-pay | Admitting: Family Medicine

## 2016-02-12 DIAGNOSIS — E559 Vitamin D deficiency, unspecified: Secondary | ICD-10-CM

## 2016-02-13 NOTE — Telephone Encounter (Signed)
Patient requesting refill.  Vitamin D

## 2016-02-14 ENCOUNTER — Ambulatory Visit
Admission: RE | Admit: 2016-02-14 | Discharge: 2016-02-14 | Disposition: A | Discharge status: Home / Self Care | Payer: Medicare Other | Source: Ambulatory Visit | Attending: Ophthalmology | Admitting: Ophthalmology

## 2016-02-14 ENCOUNTER — Ambulatory Visit: Payer: Medicare Other | Admitting: Anesthesiology

## 2016-02-14 ENCOUNTER — Encounter
Admission: RE | Disposition: A | Discharge status: Home / Self Care | Payer: Self-pay | Source: Ambulatory Visit | Attending: Ophthalmology

## 2016-02-14 DIAGNOSIS — D649 Anemia, unspecified: Secondary | ICD-10-CM | POA: Diagnosis not present

## 2016-02-14 DIAGNOSIS — R0602 Shortness of breath: Secondary | ICD-10-CM | POA: Insufficient documentation

## 2016-02-14 DIAGNOSIS — Z9841 Cataract extraction status, right eye: Secondary | ICD-10-CM | POA: Diagnosis not present

## 2016-02-14 DIAGNOSIS — M1990 Unspecified osteoarthritis, unspecified site: Secondary | ICD-10-CM | POA: Insufficient documentation

## 2016-02-14 DIAGNOSIS — I110 Hypertensive heart disease with heart failure: Secondary | ICD-10-CM | POA: Diagnosis not present

## 2016-02-14 DIAGNOSIS — E1122 Type 2 diabetes mellitus with diabetic chronic kidney disease: Secondary | ICD-10-CM | POA: Insufficient documentation

## 2016-02-14 DIAGNOSIS — I509 Heart failure, unspecified: Secondary | ICD-10-CM | POA: Diagnosis not present

## 2016-02-14 DIAGNOSIS — E785 Hyperlipidemia, unspecified: Secondary | ICD-10-CM | POA: Insufficient documentation

## 2016-02-14 DIAGNOSIS — G4730 Sleep apnea, unspecified: Secondary | ICD-10-CM | POA: Insufficient documentation

## 2016-02-14 DIAGNOSIS — I252 Old myocardial infarction: Secondary | ICD-10-CM | POA: Insufficient documentation

## 2016-02-14 DIAGNOSIS — E46 Unspecified protein-calorie malnutrition: Secondary | ICD-10-CM | POA: Insufficient documentation

## 2016-02-14 DIAGNOSIS — E669 Obesity, unspecified: Secondary | ICD-10-CM | POA: Insufficient documentation

## 2016-02-14 DIAGNOSIS — Z8673 Personal history of transient ischemic attack (TIA), and cerebral infarction without residual deficits: Secondary | ICD-10-CM | POA: Diagnosis not present

## 2016-02-14 DIAGNOSIS — Z794 Long term (current) use of insulin: Secondary | ICD-10-CM | POA: Insufficient documentation

## 2016-02-14 DIAGNOSIS — I130 Hypertensive heart and chronic kidney disease with heart failure and stage 1 through stage 4 chronic kidney disease, or unspecified chronic kidney disease: Secondary | ICD-10-CM | POA: Insufficient documentation

## 2016-02-14 DIAGNOSIS — Z87891 Personal history of nicotine dependence: Secondary | ICD-10-CM | POA: Insufficient documentation

## 2016-02-14 DIAGNOSIS — E1136 Type 2 diabetes mellitus with diabetic cataract: Secondary | ICD-10-CM | POA: Insufficient documentation

## 2016-02-14 DIAGNOSIS — K219 Gastro-esophageal reflux disease without esophagitis: Secondary | ICD-10-CM | POA: Diagnosis not present

## 2016-02-14 DIAGNOSIS — J45909 Unspecified asthma, uncomplicated: Secondary | ICD-10-CM | POA: Diagnosis not present

## 2016-02-14 DIAGNOSIS — E1140 Type 2 diabetes mellitus with diabetic neuropathy, unspecified: Secondary | ICD-10-CM | POA: Diagnosis not present

## 2016-02-14 DIAGNOSIS — Z6841 Body Mass Index (BMI) 40.0 and over, adult: Secondary | ICD-10-CM | POA: Insufficient documentation

## 2016-02-14 DIAGNOSIS — I2510 Atherosclerotic heart disease of native coronary artery without angina pectoris: Secondary | ICD-10-CM | POA: Diagnosis not present

## 2016-02-14 DIAGNOSIS — H2512 Age-related nuclear cataract, left eye: Secondary | ICD-10-CM | POA: Diagnosis not present

## 2016-02-14 DIAGNOSIS — E7800 Pure hypercholesterolemia, unspecified: Secondary | ICD-10-CM | POA: Diagnosis not present

## 2016-02-14 DIAGNOSIS — E559 Vitamin D deficiency, unspecified: Secondary | ICD-10-CM | POA: Insufficient documentation

## 2016-02-14 DIAGNOSIS — N184 Chronic kidney disease, stage 4 (severe): Secondary | ICD-10-CM | POA: Insufficient documentation

## 2016-02-14 HISTORY — DX: Edema, unspecified: R60.9

## 2016-02-14 HISTORY — DX: Acute myocardial infarction, unspecified: I21.9

## 2016-02-14 HISTORY — PX: CATARACT EXTRACTION W/PHACO: SHX586

## 2016-02-14 LAB — GLUCOSE, CAPILLARY: Glucose-Capillary: 200 mg/dL — ABNORMAL HIGH (ref 65–99)

## 2016-02-14 SURGERY — PHACOEMULSIFICATION, CATARACT, WITH IOL INSERTION
Anesthesia: Monitor Anesthesia Care | Site: Eye | Laterality: Left | Wound class: Clean

## 2016-02-14 MED ORDER — POVIDONE-IODINE 5 % OP SOLN
OPHTHALMIC | Status: AC
  Administered 2016-02-14: 1 via OPHTHALMIC
  Filled 2016-02-14: qty 30

## 2016-02-14 MED ORDER — CEFUROXIME OPHTHALMIC INJECTION 1 MG/0.1 ML
INJECTION | OPHTHALMIC | Status: AC
  Filled 2016-02-14: qty 0.1

## 2016-02-14 MED ORDER — MOXIFLOXACIN HCL 0.5 % OP SOLN
OPHTHALMIC | Status: AC
  Filled 2016-02-14: qty 3

## 2016-02-14 MED ORDER — TRYPAN BLUE 0.06 % OP SOLN
OPHTHALMIC | Status: DC | PRN
  Administered 2016-02-14: 0.5 mL via INTRAOCULAR

## 2016-02-14 MED ORDER — MOXIFLOXACIN HCL 0.5 % OP SOLN: 1.0000 [drp] | OPHTHALMIC | Status: DC | PRN

## 2016-02-14 MED ORDER — TETRACAINE HCL 0.5 % OP SOLN
1.0000 [drp] | Freq: Once | OPHTHALMIC | Status: AC
  Administered 2016-02-14: 1 [drp] via OPHTHALMIC

## 2016-02-14 MED ORDER — CEFUROXIME OPHTHALMIC INJECTION 1 MG/0.1 ML
INJECTION | OPHTHALMIC | Status: DC | PRN
  Administered 2016-02-14: 0.1 mL via INTRACAMERAL

## 2016-02-14 MED ORDER — MOXIFLOXACIN HCL 0.5 % OP SOLN
OPHTHALMIC | Status: DC | PRN
  Administered 2016-02-14: 1 [drp] via OPHTHALMIC

## 2016-02-14 MED ORDER — SODIUM CHLORIDE 0.9 % IV SOLN
INTRAVENOUS | Status: DC
  Administered 2016-02-14: 07:00:00 via INTRAVENOUS

## 2016-02-14 MED ORDER — TRYPAN BLUE 0.06 % OP SOLN
OPHTHALMIC | Status: AC
  Filled 2016-02-14: qty 0.5

## 2016-02-14 MED ORDER — ARMC OPHTHALMIC DILATING GEL
OPHTHALMIC | Status: AC
  Administered 2016-02-14: 1 via OPHTHALMIC
  Filled 2016-02-14: qty 0.25

## 2016-02-14 MED ORDER — NA CHONDROIT SULF-NA HYALURON 40-30 MG/ML IO SOLN
INTRAOCULAR | Status: AC
  Filled 2016-02-14: qty 0.5

## 2016-02-14 MED ORDER — EPINEPHRINE HCL 1 MG/ML IJ SOLN
INTRAMUSCULAR | Status: AC
  Filled 2016-02-14: qty 1

## 2016-02-14 MED ORDER — LIDOCAINE HCL (PF) 4 % IJ SOLN
INTRAMUSCULAR | Status: AC
  Filled 2016-02-14: qty 5

## 2016-02-14 MED ORDER — NA HYALUR & NA CHOND-NA HYALUR 0.55-0.5 ML IO KIT
PACK | INTRAOCULAR | Status: AC
  Filled 2016-02-14: qty 1.05

## 2016-02-14 MED ORDER — SODIUM HYALURONATE 23 MG/ML IO SOLN
INTRAOCULAR | Status: DC | PRN
  Administered 2016-02-14: 0.6 mL via INTRAOCULAR

## 2016-02-14 MED ORDER — FENTANYL CITRATE (PF) 100 MCG/2ML IJ SOLN
INTRAMUSCULAR | Status: DC | PRN
  Administered 2016-02-14: 25 ug via INTRAVENOUS

## 2016-02-14 MED ORDER — MIDAZOLAM HCL 2 MG/2ML IJ SOLN
INTRAMUSCULAR | Status: DC | PRN
  Administered 2016-02-14: 1 mg via INTRAVENOUS

## 2016-02-14 MED ORDER — LIDOCAINE HCL (PF) 4 % IJ SOLN
INTRAOCULAR | Status: DC | PRN
  Administered 2016-02-14: 08:00:00 via OPHTHALMIC

## 2016-02-14 MED ORDER — POVIDONE-IODINE 5 % OP SOLN
1.0000 "application " | Freq: Once | OPHTHALMIC | Status: AC
  Administered 2016-02-14: 1 via OPHTHALMIC

## 2016-02-14 MED ORDER — EPINEPHRINE HCL 1 MG/ML IJ SOLN
INTRAOCULAR | Status: DC | PRN
  Administered 2016-02-14: 1 mL via OPHTHALMIC

## 2016-02-14 MED ORDER — SODIUM HYALURONATE 10 MG/ML IO SOLN
INTRAOCULAR | Status: DC | PRN
  Administered 2016-02-14: 0.85 mL via INTRAOCULAR

## 2016-02-14 MED ORDER — ACETYLCHOLINE CHLORIDE 20 MG IO SOLR
INTRAOCULAR | Status: AC
  Filled 2016-02-14: qty 2

## 2016-02-14 MED ORDER — TETRACAINE HCL 0.5 % OP SOLN
OPHTHALMIC | Status: AC
  Administered 2016-02-14: 1 [drp] via OPHTHALMIC
  Filled 2016-02-14: qty 2

## 2016-02-14 MED ORDER — ARMC OPHTHALMIC DILATING GEL
1.0000 "application " | OPHTHALMIC | Status: AC | PRN
  Administered 2016-02-14 (×2): 1 via OPHTHALMIC

## 2016-02-14 SURGICAL SUPPLY — 24 items
CANNULA ANT/CHMB 27GA (MISCELLANEOUS) ×12 IMPLANT
CUP MEDICINE 2OZ PLAST GRAD ST (MISCELLANEOUS) ×3 IMPLANT
FILTER MILLEX .045 (MISCELLANEOUS) ×1 IMPLANT
FLTR MILLEX .045 (MISCELLANEOUS) ×3
GLOVE BIO SURGEON STRL SZ8 (GLOVE) ×3 IMPLANT
GLOVE BIOGEL M 6.5 STRL (GLOVE) ×3 IMPLANT
GLOVE SURG LX 7.5 STRW (GLOVE) ×2
GLOVE SURG LX STRL 7.5 STRW (GLOVE) ×1 IMPLANT
GOWN STRL REUS W/ TWL LRG LVL3 (GOWN DISPOSABLE) ×2 IMPLANT
GOWN STRL REUS W/TWL LRG LVL3 (GOWN DISPOSABLE) ×4
LENS IOL ACRSF IQ PC 15.0 (Intraocular Lens) ×1 IMPLANT
LENS IOL ACRYSOF IQ POST 15.0 (Intraocular Lens) ×3 IMPLANT
PACK CATARACT (MISCELLANEOUS) ×3 IMPLANT
PACK CATARACT BRASINGTON LX (MISCELLANEOUS) ×3 IMPLANT
PACK EYE AFTER SURG (MISCELLANEOUS) ×3 IMPLANT
SOL BSS BAG (MISCELLANEOUS) ×3
SOL PREP PVP 2OZ (MISCELLANEOUS) ×3
SOLUTION BSS BAG (MISCELLANEOUS) ×1 IMPLANT
SOLUTION PREP PVP 2OZ (MISCELLANEOUS) ×1 IMPLANT
SYR 3ML LL SCALE MARK (SYRINGE) ×9 IMPLANT
SYR 5ML LL (SYRINGE) ×3 IMPLANT
SYR TB 1ML 27GX1/2 LL (SYRINGE) ×3 IMPLANT
WATER STERILE IRR 1000ML POUR (IV SOLUTION) ×3 IMPLANT
WIPE NON LINTING 3.25X3.25 (MISCELLANEOUS) ×3 IMPLANT

## 2016-02-14 NOTE — Op Note (Signed)
OPERATIVE NOTE  Veronica Anderson [id number] 02/14/2016   PREOPERATIVE DIAGNOSIS:  Nuclear sclerotic cataract left eye.  H25.12   POSTOPERATIVE DIAGNOSIS:    Nuclear sclerotic cataract left eye.     PROCEDURE:  Phacoemusification with posterior chamber intraocular lens placement of the left eye   LENS:   Implant Name Type Inv. Item Serial No. Manufacturer Lot No. LRB No. Used  LENS IOL [id number] 15.0 - [id number] Intraocular Lens LENS IOL SN60WF 15.0 [id number] ALCON   Left 1        ULTRASOUND TIME: 3 minutes 12 seconds.  CDE 47.12   SURGEON:  Benay Pillow, MD, MPH   ANESTHESIA:  Topical with tetracaine drops and 2% Xylocaine jelly, augmented with 1% preservative-free intracameral lidocaine.   COMPLICATIONS:  None.   DESCRIPTION OF PROCEDURE:  The patient was identified in the holding room and transported to the operating room and placed in the supine position under the operating microscope.  The left eye was identified as the operative eye and it was prepped and draped in the usual sterile ophthalmic fashion.   A 1.0 millimeter clear-corneal paracentesis was made at the 5:00 position.  There was a poor red reflex, so Trypan blue was instilled under an air bubble and rinsed out with BSS to stain the capsule for improved visualization.  0.5 ml of preservative-free 1% lidocaine with epinephrine was injected into the anterior chamber.  The anterior chamber was filled with Healon5 viscoelastic.  A 2.4 millimeter keratome was used to make a near-clear corneal incision at the 2:00 position.  A curvilinear capsulorrhexis was made with a cystotome and capsulorrhexis forceps.  Balanced salt solution was used to hydrodissect and hydrodelineate the nucleus.   Phacoemulsification was then used in stop and chop fashion to remove the lens nucleus and epinucleus.  The remaining cortex was then removed using the irrigation and aspiration handpiece. Healon was then placed into the capsular bag to distend it  for lens placement.  A lens was then injected into the capsular bag.  The remaining viscoelastic was aspirated.   Wounds were hydrated with balanced salt solution.  The anterior chamber was inflated to a physiologic pressure with balanced salt solution.   Intracameral cefuroxime 0.1 mL at 10mg /mL was injected into the eye.  No wound leaks were noted.  Topical Vigamox drops were applied to the eye.  The patient was taken to the recovery room in stable condition without complications of anesthesia or surgery  Benay Pillow 02/14/2016, 8:40 AM

## 2016-02-14 NOTE — Transfer of Care (Signed)
Immediate Anesthesia Transfer of Care Note  Patient: Lenzi Klipp  Procedure(s) Performed: Procedure(s) with comments: CATARACT EXTRACTION PHACO AND INTRAOCULAR LENS PLACEMENT (IOC) (Left) - [place] 3.12 AP% 42.1 CDE 47.12 Fluid Pack lot # [id number]  Patient Location: Short Stay  Anesthesia Type:MAC  Level of Consciousness: awake, alert  and oriented  Airway & Oxygen Therapy: Patient Spontanous Breathing  Post-op Assessment: Report given to RN and Post -op Vital signs reviewed and stable  Post vital signs: Reviewed and stable  Last Vitals:  Vitals:   02/14/16 0613 02/14/16 0641  BP: (!) 189/118 (!) 144/48  Pulse:    Resp:    Temp:      Last Pain:  Vitals:   02/14/16 0611  TempSrc: Tympanic         Complications: No apparent anesthesia complications

## 2016-02-14 NOTE — Discharge Instructions (Signed)
Eye Surgery Discharge Instructions  Expect mild scratchy sensation or mild soreness. DO NOT RUB YOUR EYE!  The day of surgery:  Minimal physical activity, but bed rest is not required  No reading, computer work, or close hand work  No bending, lifting, or straining.  May watch TV  For 24 hours:  No driving, legal decisions, or alcoholic beverages  Safety precautions  Eat anything you prefer: It is better to start with liquids, then soup then solid foods.  _____ Eye patch should be worn until postoperative exam tomorrow.  ____ Solar shield eyeglasses should be worn for comfort in the sunlight/patch while sleeping  Resume all regular medications including aspirin or Coumadin if these were discontinued prior to surgery. You may shower, bathe, shave, or wash your hair. Tylenol may be taken for mild discomfort.  Call your doctor if you experience significant pain, nausea, or vomiting, fever > 101 or other signs of infection. [phone] or [phone] Specific instructions:  Follow-up Information    Benay Pillow, MD .   Specialty:  Ophthalmology Why:  August 4 at 10:45am Contact information: [address] [place] [address] [phone]

## 2016-02-14 NOTE — H&P (Signed)
  The History and Physical notes are on paper, have been signed, and are to be scanned. The patient remains stable and unchanged from the H&P.   Previous H&P reviewed, patient examined, and there are no changes.  Veronica Anderson 02/14/2016 7:45 AM

## 2016-02-14 NOTE — Anesthesia Postprocedure Evaluation (Signed)
Anesthesia Post Note  Patient: Veronica Anderson  Procedure(s) Performed: Procedure(s) (LRB): CATARACT EXTRACTION PHACO AND INTRAOCULAR LENS PLACEMENT (IOC) (Left)  Patient location during evaluation: Short Stay Anesthesia Type: MAC Level of consciousness: oriented and awake and alert Pain management: satisfactory to patient Vital Signs Assessment: post-procedure vital signs reviewed and stable Respiratory status: respiratory function stable Cardiovascular status: stable Anesthetic complications: no    Last Vitals:  Vitals:   02/14/16 0613 02/14/16 0641  BP: (!) 189/118 (!) 144/48  Pulse:    Resp:    Temp:      Last Pain:  Vitals:   02/14/16 0611  TempSrc: Tympanic                 Blima Singer

## 2016-02-14 NOTE — Anesthesia Preprocedure Evaluation (Signed)
Anesthesia Evaluation  Patient identified by MRN, date of birth, ID band Patient awake    Reviewed: Allergy & Precautions, H&P , NPO status , Patient's Chart, lab work & pertinent test results  History of Anesthesia Complications Negative for: history of anesthetic complications  Airway Mallampati: III  TM Distance: <3 FB Neck ROM: limited    Dental  (+) Poor Dentition, Chipped, Missing, Loose   Pulmonary shortness of breath, asthma , sleep apnea , former smoker,    Pulmonary exam normal breath sounds clear to auscultation       Cardiovascular Exercise Tolerance: Poor hypertension, (-) angina+ CAD, + Past MI, +CHF and + DOE  negative cardio ROS Normal cardiovascular exam Rhythm:regular Rate:Normal     Neuro/Psych CVA, Residual Symptoms negative psych ROS   GI/Hepatic Neg liver ROS, GERD  Controlled,  Endo/Other  diabetes, Type 2, Insulin Dependent  Renal/GU Renal disease  negative genitourinary   Musculoskeletal  (+) Arthritis ,   Abdominal   Peds  Hematology negative hematology ROS (+)   Anesthesia Other Findings Past Medical History:   GERD (gastroesophageal reflux disease)                       Left ankle pain                                              Requires supplemental oxygen                                 Obesity                                                      Vitamin D deficiency                                         Diabetes mellitus without complication (HCC)                 Anemia                                                       Hyperlipidemia                                               Hypertension                                                 CHF (NYHA class II, ACC/AHA stage C) ([place])                   Allergy  Constipation                                                 Lumbago                                                       Asthma                                                       Protein calorie malnutrition (HCC)                           OA (osteoarthritis)                                          Paresthesia                                                    Comment:Foot   Cataract                                                       Comment:bilateral   Obstructive sleep apnea syndrome                               Comment:CPAP   Stroke (HCC)                                                 Neuropathy (HCC)                                             Chronic kidney disease                                         Comment:stage IV (severe), Dr. Aleene Davidson  Past Surgical History:   ANKLE SURGERY                                   Left              COLONOSCOPY  FRACTURE SURGERY                                Left                Comment:ankle  BMI    Body Mass Index   51.64 kg/m 2      Reproductive/Obstetrics negative OB ROS                             Anesthesia Physical  Anesthesia Plan  ASA: IV  Anesthesia Plan: MAC   Post-op Pain Management:    Induction:   Airway Management Planned:   Additional Equipment:   Intra-op Plan:   Post-operative Plan:   Informed Consent:   Dental Advisory Given  Plan Discussed with: Anesthesiologist, CRNA and Surgeon  Anesthesia Plan Comments:         Anesthesia Quick Evaluation

## 2016-02-14 NOTE — Anesthesia Procedure Notes (Signed)
Procedure Name: MAC Performed by: Britaney Espaillat Pre-anesthesia Checklist: Patient identified, Emergency Drugs available, Suction available, Patient being monitored and Timeout performed Oxygen Delivery Method: Nasal cannula       

## 2016-03-07 ENCOUNTER — Encounter: Payer: Self-pay | Admitting: Ophthalmology

## 2016-03-18 ENCOUNTER — Ambulatory Visit: Payer: Commercial Managed Care - HMO | Admitting: Family Medicine

## 2016-04-15 ENCOUNTER — Encounter: Payer: Self-pay | Admitting: Family Medicine

## 2016-04-15 ENCOUNTER — Ambulatory Visit (INDEPENDENT_AMBULATORY_CARE_PROVIDER_SITE_OTHER): Payer: Commercial Managed Care - HMO | Admitting: Family Medicine

## 2016-04-15 VITALS — BP 134/72 | HR 67 | Temp 98.2°F | Resp 16 | Ht 64.0 in | Wt 312.6 lb

## 2016-04-15 DIAGNOSIS — E1140 Type 2 diabetes mellitus with diabetic neuropathy, unspecified: Secondary | ICD-10-CM

## 2016-04-15 DIAGNOSIS — G4733 Obstructive sleep apnea (adult) (pediatric): Secondary | ICD-10-CM | POA: Diagnosis not present

## 2016-04-15 DIAGNOSIS — Z23 Encounter for immunization: Secondary | ICD-10-CM | POA: Diagnosis not present

## 2016-04-15 DIAGNOSIS — E441 Mild protein-calorie malnutrition: Secondary | ICD-10-CM

## 2016-04-15 DIAGNOSIS — N184 Chronic kidney disease, stage 4 (severe): Secondary | ICD-10-CM | POA: Diagnosis not present

## 2016-04-15 DIAGNOSIS — L309 Dermatitis, unspecified: Secondary | ICD-10-CM

## 2016-04-15 DIAGNOSIS — Z794 Long term (current) use of insulin: Secondary | ICD-10-CM

## 2016-04-15 DIAGNOSIS — J3089 Other allergic rhinitis: Secondary | ICD-10-CM | POA: Diagnosis not present

## 2016-04-15 DIAGNOSIS — E1144 Type 2 diabetes mellitus with diabetic amyotrophy: Secondary | ICD-10-CM

## 2016-04-15 DIAGNOSIS — I10 Essential (primary) hypertension: Secondary | ICD-10-CM

## 2016-04-15 DIAGNOSIS — J302 Other seasonal allergic rhinitis: Secondary | ICD-10-CM

## 2016-04-15 DIAGNOSIS — I5030 Unspecified diastolic (congestive) heart failure: Secondary | ICD-10-CM | POA: Diagnosis not present

## 2016-04-15 DIAGNOSIS — E1122 Type 2 diabetes mellitus with diabetic chronic kidney disease: Secondary | ICD-10-CM

## 2016-04-15 LAB — POCT GLYCOSYLATED HEMOGLOBIN (HGB A1C): Hemoglobin A1C: 6.2

## 2016-04-15 MED ORDER — INSULIN PEN NEEDLE 32G X 6 MM MISC: 1.0000 | Freq: Four times a day (QID) | 5 refills | Status: DC

## 2016-04-15 MED ORDER — PIMECROLIMUS 1 % EX CREA: TOPICAL_CREAM | Freq: Two times a day (BID) | CUTANEOUS | 0 refills | Status: DC

## 2016-04-15 NOTE — Progress Notes (Signed)
Name: Veronica Anderson   MRN: [id number]    DOB: Sep 17, 1945   Date:04/15/2016       Progress Note  Subjective  Chief Complaint  Chief Complaint  Patient presents with  . Medication Refill    5 month F/U  . Diabetes    Checks 2x a day, Lowest-62 Highest in the morning is 157 in the afternon is 178  . Hypertension  . Hyperlipidemia  . Gastroesophageal Reflux  . Sleep Apnea    Still using CPAP nightly and sleeps around 8 hours nightly  . Allergic Rhinitis     Need medication refill, runny eye and nose has been without medication    HPI   DMII: no appetite, but she has been eating. She has gained weight since last visit, 11 lbs . She is following a diabetic diet and glucose has been at goal, glucose around 90-100's fasting, but it drops to 57 once, and only happens when she gives herself a shot and does not eat.  She has been using Humalog 5 units before meals. She went for an eye exam and had surgery, seeing better. Using Lantus 42 units daily.  She has retinopathy and needs to follow up with Ophthalmologist, she states neuropathy is under control with Gabapentin, she also has CKI and is not on ACE because of history of allergy.   HTN: she has been taking medication as prescribed , no chest pain or palpitation. Complaint with medication and denies side effects.   Hyperlipidemia: taking medication and denies side effects, including no myalgia  CKI: she was seen by nephrologist, and was advised to continue follow up, son has not made her a follow up appointment yet. She is due for follow up, but son is having problems getting FMLA approval  OSA: uses CPAP every night, all night and is having supplemental oxygen through the CPAP machine. She has a rash on her face from the mask. We will try Elidel  CHF: diastolic, she still has orthopnea, uses 3 pillows at night, using CPAP, no leg swelling, states she has some dyspnea.  On beta-blocker, hydralazine, not on ACE/ARB because of allergies.    Protein malnutrition: gaining weight again, but not a very balanced diet, increase protein intake  Perennial allergic rhinitis: out of singulair and rhinorrhea is worse, explained rx already at pharmacy  Patient Active Problem List   Diagnosis Date Noted  . Nocturnal oxygen desaturation 06/26/2015  . Anemia in chronic illness 02/23/2015  . Asthma, mild intermittent 02/23/2015  . Benign essential HTN 02/23/2015  . Diastolic heart failure, NYHA class 3 ([place]) 02/23/2015  . Chronic kidney disease (CKD), stage IV (severe) ([place]) 02/23/2015  . CN (constipation) 02/23/2015  . Diabetes mellitus with neuropathy ([place]) 02/23/2015  . Dyslipidemia 02/23/2015  . Elevated ferritin 02/23/2015  . Gastro-esophageal reflux disease without esophagitis 02/23/2015  . LBP (low back pain) 02/23/2015  . Extreme obesity ([place]) 02/23/2015  . Obstructive apnea 02/23/2015  . Osteoarthritis 02/23/2015  . Neuropathy ([place]) 02/23/2015  . Perennial allergic rhinitis with seasonal variation 02/23/2015  . History of pneumonia 02/23/2015  . Vitamin D deficiency 02/23/2015  . Cataract 04/16/2010  . Background diabetic retinopathy ([place]) 12/18/2009    Past Surgical History:  Procedure Laterality Date  . ANKLE SURGERY Left   . CATARACT EXTRACTION W/PHACO Right 12/20/2015   Procedure: CATARACT EXTRACTION PHACO AND INTRAOCULAR LENS PLACEMENT (IOC);  Surgeon: Eulogio Bear, MD;  Location: [facility] ORS;  Service: Ophthalmology;  Laterality: Right;  [place] 2.50 AP% 21.8  CDE 37.04 Fluid pack lot # [id number]  . CATARACT EXTRACTION W/PHACO Left 02/14/2016   Procedure: CATARACT EXTRACTION PHACO AND INTRAOCULAR LENS PLACEMENT (IOC);  Surgeon: Eulogio Bear, MD;  Location: [facility] ORS;  Service: Ophthalmology;  Laterality: Left;  [place] 3.12 AP% 42.1 CDE 47.12 Fluid Pack lot # [id number]  . COLONOSCOPY    . FRACTURE SURGERY Left    ankle    Family History  Problem Relation Age of Onset  . Diabetes type II      Social History    Social History  . Marital status: Single    Spouse name: N/A  . Number of children: N/A  . Years of education: N/A   Occupational History  . Not on file.   Social History Main Topics  . Smoking status: Former Smoker    Types: Cigarettes    Quit date: 07/14/1994  . Smokeless tobacco: Never Used  . Alcohol use No  . Drug use: No  . Sexual activity: Not Currently   Other Topics Concern  . Not on file   Social History Narrative  . No narrative on file     Current Outpatient Prescriptions:  .  ACCU-CHEK AVIVA PLUS test strip, CHECK BLOOD SUGAR TWICE DAILY, Disp: 200 each, Rfl: 3 .  amLODipine (NORVASC) 10 MG tablet, Take 1 tablet (10 mg total) by mouth daily., Disp: 90 tablet, Rfl: 2 .  aspirin EC 81 MG tablet, Take 81 mg by mouth at bedtime., Disp: , Rfl:  .  cloNIDine (CATAPRES-TTS-3) 0.3 mg/24hr patch, Place 1 patch (0.3 mg total) onto the skin once a week. Pt applies patch on Sunday., Disp: 12 patch, Rfl: 2 .  fluticasone (FLONASE) 50 MCG/ACT nasal spray, USE 2 SPRAYS IN EACH NOSTRIL EVERY NIGHT AT BEDTIME, Disp: 16 g, Rfl: 2 .  furosemide (LASIX) 20 MG tablet, TAKE 1 TABLET BY MOUTH TWICE A DAY, Disp: 60 tablet, Rfl: 5 .  gabapentin (NEURONTIN) 300 MG capsule, Take 1 capsule (300 mg total) by mouth 3 (three) times daily., Disp: 270 capsule, Rfl: 2 .  HUMALOG KWIKPEN 100 UNIT/ML KiwkPen, INJECT 5 TO 10 UNITS SUBCUTANEOUSLY BEFORE MEALS, Disp: 6 mL, Rfl: 5 .  hydrALAZINE (APRESOLINE) 50 MG tablet, TAKE 1 TABLET BY MOUTH 3 TIMES A DAY, Disp: 90 tablet, Rfl: 4 .  LANTUS SOLOSTAR 100 UNIT/ML Solostar Pen, INJECT 50 UNITS SUBCUTANEOUSLY DAILY, Disp: 5 pen, Rfl: 5 .  loratadine (CLARITIN) 10 MG tablet, TAKE 1 TABLET BY MOUTH EVERY DAY FOR ALLERGIES, Disp: 30 tablet, Rfl: 5 .  metoprolol (LOPRESSOR) 100 MG tablet, Take 1 tablet (100 mg total) by mouth 2 (two) times daily., Disp: 180 tablet, Rfl: 2 .  montelukast (SINGULAIR) 10 MG tablet, Take 1 tablet (10 mg total) by mouth daily.,  Disp: 90 tablet, Rfl: 2 .  nystatin (MYCOSTATIN/NYSTOP) 100000 UNIT/GM POWD, Apply 1 Bottle topically 3 (three) times daily., Disp: 1 Bottle, Rfl: 0 .  omeprazole (PRILOSEC) 40 MG capsule, Take 1 capsule (40 mg total) by mouth daily., Disp: 90 capsule, Rfl: 2 .  pravastatin (PRAVACHOL) 40 MG tablet, TAKE 1 TABLET BY MOUTH ONCE A DAY FOR CHOLESTEROL, Disp: 90 tablet, Rfl: 2 .  Vitamin D, Ergocalciferol, (DRISDOL) 50000 units CAPS capsule, TAKE 1 CAPSULE (50,000 UNITS TOTAL) BY MOUTH ONCE A WEEK., Disp: 4 capsule, Rfl: 4 .  Insulin Pen Needle 32G X 6 MM MISC, 1 each by Does not apply route 4 (four) times daily., Disp: 120 each, Rfl: 5 .  pimecrolimus (ELIDEL) 1 %  cream, Apply topically 2 (two) times daily., Disp: 30 g, Rfl: 0  No Known Allergies   ROS  Ten systems reviewed and is negative except as mentioned in HPI   Objective  Vitals:   04/15/16 1111  BP: 134/72  Pulse: 67  Resp: 16  Temp: 98.2 F (36.8 C)  TempSrc: Oral  SpO2: 90%  Weight: (!) 312 lb 9.6 oz (141.8 kg)  Height: 5\' 4"  (1.626 m)    Body mass index is 53.66 kg/m.  Physical Exam  Constitutional: Patient appears well-developed and well-nourished. Obese No distress.  HEENT: head atraumatic, normocephalic, pupils equal and reactive to light,neck supple, throat within normal limits Cardiovascular: Normal rate, regular rhythm and normal heart sounds.  No murmur heard. No BLE edema. Pulmonary/Chest: Effort normal and breath sounds normal. No respiratory distress. Abdominal: Soft.  There is no tenderness. Psychiatric: Patient has a normal mood and affect. behavior is normal. Judgment and thought content normal. Skin: facial dryness on the area of CPAP machine  Recent Results (from the past 2160 hour(s))  Glucose, capillary     Status: Abnormal   Collection Time: 02/14/16  6:11 AM  Result Value Ref Range   Glucose-Capillary 200 (H) 65 - 99 mg/dL  POCT HgB A1C     Status: None   Collection Time: 04/15/16 11:20 AM   Result Value Ref Range   Hemoglobin A1C 6.2       PHQ2/9: Depression screen [facility] 2/9 04/15/2016 11/14/2015 06/26/2015 02/26/2015  Decreased Interest 0 0 0 0  Down, Depressed, Hopeless 0 0 0 0  PHQ - 2 Score 0 0 0 0    Fall Risk: Fall Risk  04/15/2016 11/14/2015 06/26/2015 02/26/2015  Falls in the past year? No No No No     Functional Status Survey: Is the patient deaf or have difficulty hearing?: No Does the patient have difficulty seeing, even when wearing glasses/contacts?: No Does the patient have difficulty concentrating, remembering, or making decisions?: No Does the patient have difficulty walking or climbing stairs?: Yes (Patient is a wheelchair) Does the patient have difficulty dressing or bathing?: No Does the patient have difficulty doing errands alone such as visiting a doctor's office or shopping?: Yes (Patient does not drive)    Assessment & Plan  1. Type 2 diabetes mellitus with diabetic neuropathy, with long-term current use of insulin (HCC)  Consider going down on Lantus if fasting glucose stays below 90, can't skip meals but needs to stop drinking regular sodas - POCT HgB A1C - Insulin Pen Needle 32G X 6 MM MISC; 1 each by Does not apply route 4 (four) times daily.  Dispense: 120 each; Refill: 5  2. Needs flu shot   - Flu vaccine HIGH DOSE PF (Fluzone High dose)  3. Obstructive apnea  Continue CPAP   4. Essential hypertension  Well controlled  5. Type 2 diabetes mellitus with stage 4 chronic kidney disease, with long-term current use of insulin (HCC)  - Insulin Pen Needle 32G X 6 MM MISC; 1 each by Does not apply route 4 (four) times daily.  Dispense: 120 each; Refill: 5  6. Diastolic heart failure, NYHA class 3 (HCC)  - Insulin Pen Needle 32G X 6 MM MISC; 1 each by Does not apply route 4 (four) times daily.  Dispense: 120 each; Refill: 5  7. 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  Dispense: 120 each; Refill: 5  8. Mild protein-calorie malnutrition ([place])  Needs to stop eating fastin food, and sodas and eat real food  9. Eczema of face  - pimecrolimus (ELIDEL) 1 % cream; Apply topically 2 (two) times daily.  Dispense: 30 g; Refill: 0

## 2016-04-30 ENCOUNTER — Other Ambulatory Visit: Payer: Self-pay

## 2016-04-30 NOTE — Telephone Encounter (Signed)
Patient requesting refill of Hydralazine and Lantus.

## 2016-05-01 MED ORDER — HYDRALAZINE HCL 50 MG PO TABS: 50.0000 mg | ORAL_TABLET | Freq: Three times a day (TID) | ORAL | 4 refills | Status: DC

## 2016-05-01 MED ORDER — INSULIN GLARGINE 100 UNIT/ML SOLOSTAR PEN: PEN_INJECTOR | SUBCUTANEOUS | 5 refills | Status: DC

## 2016-05-04 ENCOUNTER — Other Ambulatory Visit: Payer: Self-pay | Admitting: Family Medicine

## 2016-05-17 ENCOUNTER — Other Ambulatory Visit: Payer: Self-pay | Admitting: Family Medicine

## 2016-05-17 DIAGNOSIS — I10 Essential (primary) hypertension: Secondary | ICD-10-CM

## 2016-05-22 ENCOUNTER — Other Ambulatory Visit: Payer: Self-pay | Admitting: Family Medicine

## 2016-05-22 DIAGNOSIS — I10 Essential (primary) hypertension: Secondary | ICD-10-CM

## 2016-05-23 NOTE — Telephone Encounter (Signed)
Patient requesting refill of [facility] to [facility].

## 2016-06-08 ENCOUNTER — Other Ambulatory Visit: Payer: Self-pay | Admitting: Family Medicine

## 2016-06-08 DIAGNOSIS — I5030 Unspecified diastolic (congestive) heart failure: Secondary | ICD-10-CM

## 2016-08-18 ENCOUNTER — Ambulatory Visit: Payer: Commercial Managed Care - HMO | Admitting: Family Medicine

## 2016-08-21 ENCOUNTER — Encounter: Payer: Self-pay | Admitting: Family Medicine

## 2016-08-21 ENCOUNTER — Other Ambulatory Visit: Payer: Self-pay | Admitting: Family Medicine

## 2016-08-21 ENCOUNTER — Ambulatory Visit (INDEPENDENT_AMBULATORY_CARE_PROVIDER_SITE_OTHER): Payer: Medicare Other | Admitting: Family Medicine

## 2016-08-21 VITALS — BP 136/78 | HR 77 | Temp 97.7°F | Resp 18 | Ht 64.0 in | Wt 313.1 lb

## 2016-08-21 DIAGNOSIS — N183 Chronic kidney disease, stage 3 unspecified: Secondary | ICD-10-CM

## 2016-08-21 DIAGNOSIS — G4733 Obstructive sleep apnea (adult) (pediatric): Secondary | ICD-10-CM

## 2016-08-21 DIAGNOSIS — E1121 Type 2 diabetes mellitus with diabetic nephropathy: Secondary | ICD-10-CM

## 2016-08-21 DIAGNOSIS — K219 Gastro-esophageal reflux disease without esophagitis: Secondary | ICD-10-CM | POA: Diagnosis not present

## 2016-08-21 DIAGNOSIS — E1140 Type 2 diabetes mellitus with diabetic neuropathy, unspecified: Secondary | ICD-10-CM | POA: Diagnosis not present

## 2016-08-21 DIAGNOSIS — J302 Other seasonal allergic rhinitis: Secondary | ICD-10-CM | POA: Diagnosis not present

## 2016-08-21 DIAGNOSIS — I129 Hypertensive chronic kidney disease with stage 1 through stage 4 chronic kidney disease, or unspecified chronic kidney disease: Secondary | ICD-10-CM | POA: Diagnosis not present

## 2016-08-21 DIAGNOSIS — I10 Essential (primary) hypertension: Secondary | ICD-10-CM

## 2016-08-21 DIAGNOSIS — E1144 Type 2 diabetes mellitus with diabetic amyotrophy: Secondary | ICD-10-CM | POA: Diagnosis not present

## 2016-08-21 DIAGNOSIS — Z794 Long term (current) use of insulin: Secondary | ICD-10-CM

## 2016-08-21 DIAGNOSIS — L309 Dermatitis, unspecified: Secondary | ICD-10-CM | POA: Diagnosis not present

## 2016-08-21 DIAGNOSIS — E785 Hyperlipidemia, unspecified: Secondary | ICD-10-CM

## 2016-08-21 DIAGNOSIS — I5030 Unspecified diastolic (congestive) heart failure: Secondary | ICD-10-CM

## 2016-08-21 DIAGNOSIS — E559 Vitamin D deficiency, unspecified: Secondary | ICD-10-CM | POA: Diagnosis not present

## 2016-08-21 DIAGNOSIS — J3089 Other allergic rhinitis: Secondary | ICD-10-CM | POA: Diagnosis not present

## 2016-08-21 LAB — LIPID PANEL
Cholesterol: 139 mg/dL (ref <200)
HDL: 43 mg/dL — ABNORMAL LOW (ref >50)
LDL CALC: 80 mg/dL (ref <100)
Total CHOL/HDL Ratio: 3.2 Ratio (ref <5.0)
Triglycerides: 80 mg/dL (ref <150)
VLDL: 16 mg/dL (ref <30)

## 2016-08-21 LAB — CBC WITH DIFFERENTIAL/PLATELET
BASOS PCT: 0 %
Basophils Absolute: 0 cells/uL (ref 0–200)
Eosinophils Absolute: 86 cells/uL (ref 15–500)
Eosinophils Relative: 1 %
HEMATOCRIT: 31.8 % — AB (ref 35.0–45.0)
Hemoglobin: 9.4 g/dL — ABNORMAL LOW (ref 11.7–15.5)
LYMPHS PCT: 13 %
Lymphs Abs: 1118 cells/uL (ref 850–3900)
MCH: 22.4 pg — ABNORMAL LOW (ref 27.0–33.0)
MCHC: 29.6 g/dL — ABNORMAL LOW (ref 32.0–36.0)
MCV: 75.7 fL — AB (ref 80.0–100.0)
MONOS PCT: 7 %
MPV: 9 fL (ref 7.5–12.5)
Monocytes Absolute: 602 cells/uL (ref 200–950)
NEUTROS PCT: 79 %
Neutro Abs: 6794 cells/uL (ref 1500–7800)
PLATELETS: 270 10*3/uL (ref 140–400)
RBC: 4.2 MIL/uL (ref 3.80–5.10)
RDW: 16.8 % — AB (ref 11.0–15.0)
WBC: 8.6 10*3/uL (ref 3.8–10.8)

## 2016-08-21 LAB — COMPLETE METABOLIC PANEL WITH GFR
ALT: 9 U/L (ref 6–29)
AST: 12 U/L (ref 10–35)
Albumin: 3.2 g/dL — ABNORMAL LOW (ref 3.6–5.1)
Alkaline Phosphatase: 78 U/L (ref 33–130)
BUN: 17 mg/dL (ref 7–25)
CALCIUM: 8.2 mg/dL — AB (ref 8.6–10.4)
CHLORIDE: 100 mmol/L (ref 98–110)
CO2: 33 mmol/L — AB (ref 20–31)
CREATININE: 1.52 mg/dL — AB (ref 0.60–0.93)
GFR, EST AFRICAN AMERICAN: 40 mL/min — AB (ref >=60)
GFR, Est Non African American: 34 mL/min — ABNORMAL LOW (ref >=60)
Glucose, Bld: 157 mg/dL — ABNORMAL HIGH (ref 65–99)
POTASSIUM: 3.7 mmol/L (ref 3.5–5.3)
Sodium: 145 mmol/L (ref 135–146)
Total Bilirubin: 0.6 mg/dL (ref 0.2–1.2)
Total Protein: 7 g/dL (ref 6.1–8.1)

## 2016-08-21 LAB — POCT GLYCOSYLATED HEMOGLOBIN (HGB A1C): HEMOGLOBIN A1C: 6.4

## 2016-08-21 MED ORDER — PRAVASTATIN SODIUM 40 MG PO TABS: ORAL_TABLET | ORAL | 2 refills | Status: DC

## 2016-08-21 MED ORDER — OMEPRAZOLE 40 MG PO CPDR: 40.0000 mg | DELAYED_RELEASE_CAPSULE | Freq: Every day | ORAL | 2 refills | Status: DC

## 2016-08-21 MED ORDER — METOPROLOL TARTRATE 100 MG PO TABS: 100.0000 mg | ORAL_TABLET | Freq: Two times a day (BID) | ORAL | 2 refills | Status: DC

## 2016-08-21 MED ORDER — INSULIN LISPRO 100 UNIT/ML (KWIKPEN): PEN_INJECTOR | SUBCUTANEOUS | 5 refills | Status: DC

## 2016-08-21 MED ORDER — RANITIDINE HCL 150 MG PO TABS: 150.0000 mg | ORAL_TABLET | Freq: Two times a day (BID) | ORAL | 1 refills | Status: DC

## 2016-08-21 MED ORDER — FUROSEMIDE 20 MG PO TABS: 20.0000 mg | ORAL_TABLET | Freq: Two times a day (BID) | ORAL | 1 refills | Status: DC

## 2016-08-21 MED ORDER — AMLODIPINE BESYLATE 10 MG PO TABS: 10.0000 mg | ORAL_TABLET | Freq: Every day | ORAL | 1 refills | Status: DC

## 2016-08-21 MED ORDER — PIMECROLIMUS 1 % EX CREA: TOPICAL_CREAM | Freq: Two times a day (BID) | CUTANEOUS | 0 refills | Status: DC

## 2016-08-21 MED ORDER — MONTELUKAST SODIUM 10 MG PO TABS: 10.0000 mg | ORAL_TABLET | Freq: Every day | ORAL | 2 refills | Status: DC

## 2016-08-21 NOTE — Telephone Encounter (Signed)
Patient requesting refill of [facility] to [facility].

## 2016-08-21 NOTE — Progress Notes (Signed)
Name: Veronica Anderson   MRN: [id number]    DOB: [birth year]   Date:08/21/2016       Progress Note  Subjective  Chief Complaint  Chief Complaint  Patient presents with  . Diabetes    4 month follow up  . Hypertension  . Hyperlipidemia  . Sleep Apnea  . Gastroesophageal Reflux    HPI  DMII: her weight has been stable.  She is following a diabetic diet and glucose has been at goal, glucose around 90-100's fasting, down to 30 units of Lantus and no longer has hypoglycemia.   She has been using Humalog 5 units before meals. She went for an eye exam and had surgery, seeing better. she states neuropathy is under control with Gabapentin - no pain, but still had decrease in sensation of both feet.  she also has CKI and is not on ACE because of history of allergy.   HTN: she has been taking medication as prescribed , no chest pain or palpitation. Complaint with medication and denies side effects.   Hyperlipidemia: taking medication and denies side effects, including no myalgia  CKI: she was seen by nephrologist, and was advised to continue follow up, son has not made her a follow up appointment yet. She states she does not want to go back right now since her kidney function improved, we will recheck labs today and decide once results are back   OSA: uses CPAP every night, all night and is having supplemental oxygen through the CPAP machine. She has a rash on her face from the mask. Elidel was sent to pharmacy but son states it was not there to be picked up  CHF: diastolic, she still has orthopnea, uses 3 pillows at night, using CPAP, mild  leg swelling, states she has SOB with activity. On beta-blocker, hydralazine, not on ACE/ARB - she is not sure if she had angioedema, but afraid to try it  GERD: under control, she is on Omeprazole and discussed long term risk of medication, advised to wean self off.    Patient Active Problem List   Diagnosis Date Noted  . Nocturnal oxygen desaturation  06/26/2015  . Anemia in chronic illness 02/23/2015  . Asthma, mild intermittent 02/23/2015  . Benign essential HTN 02/23/2015  . Diastolic heart failure, NYHA class 3 ([place]) 02/23/2015  . Benign hypertension with chronic kidney disease, stage III 02/23/2015  . CN (constipation) 02/23/2015  . Diabetes mellitus with neuropathy ([place]) 02/23/2015  . Dyslipidemia 02/23/2015  . Elevated ferritin 02/23/2015  . Gastro-esophageal reflux disease without esophagitis 02/23/2015  . LBP (low back pain) 02/23/2015  . Extreme obesity ([place]) 02/23/2015  . Obstructive apnea 02/23/2015  . Osteoarthritis 02/23/2015  . Neuropathy ([place]) 02/23/2015  . Perennial allergic rhinitis with seasonal variation 02/23/2015  . History of pneumonia 02/23/2015  . Vitamin D deficiency 02/23/2015  . Cataract 04/16/2010  . Background diabetic retinopathy ([place]) 12/18/2009    Past Surgical History:  Procedure Laterality Date  . ANKLE SURGERY Left   . CATARACT EXTRACTION W/PHACO Right 12/20/2015   Procedure: CATARACT EXTRACTION PHACO AND INTRAOCULAR LENS PLACEMENT (IOC);  Surgeon: Eulogio Bear, MD;  Location: [facility] ORS;  Service: Ophthalmology;  Laterality: Right;  [place] 2.50 AP% 21.8 CDE 37.04 Fluid pack lot # [id number]  . CATARACT EXTRACTION W/PHACO Left 02/14/2016   Procedure: CATARACT EXTRACTION PHACO AND INTRAOCULAR LENS PLACEMENT (IOC);  Surgeon: Eulogio Bear, MD;  Location: [facility] ORS;  Service: Ophthalmology;  Laterality: Left;  [place] 3.12 AP% 42.1 CDE 47.12  Fluid Pack lot # [id number]  . COLONOSCOPY    . FRACTURE SURGERY Left    ankle    Family History  Problem Relation Age of Onset  . Diabetes type II      Social History   Social History  . Marital status: Single    Spouse name: N/A  . Number of children: N/A  . Years of education: N/A   Occupational History  . Not on file.   Social History Main Topics  . Smoking status: Former Smoker    Types: Cigarettes    Quit date: 07/14/1994  . Smokeless  tobacco: Never Used  . Alcohol use No  . Drug use: No  . Sexual activity: Not Currently   Other Topics Concern  . Not on file   Social History Narrative  . No narrative on file     Current Outpatient Prescriptions:  .  ACCU-CHEK AVIVA PLUS test strip, CHECK BLOOD SUGAR TWICE DAILY, Disp: 200 each, Rfl: 3 .  amLODipine (NORVASC) 10 MG tablet, Take 1 tablet (10 mg total) by mouth daily., Disp: 90 tablet, Rfl: 1 .  aspirin EC 81 MG tablet, Take 81 mg by mouth at bedtime., Disp: , Rfl:  .  cloNIDine (CATAPRES-TTS-3) 0.3 mg/24hr patch, Place 1 patch (0.3 mg total) onto the skin once a week. Pt applies patch on Sunday., Disp: 12 patch, Rfl: 2 .  fluticasone (FLONASE) 50 MCG/ACT nasal spray, USE 2 SPRAYS IN EACH NOSTRIL EVERY NIGHT AT BEDTIME, Disp: 16 g, Rfl: 2 .  furosemide (LASIX) 20 MG tablet, Take 1 tablet (20 mg total) by mouth 2 (two) times daily., Disp: 180 tablet, Rfl: 1 .  gabapentin (NEURONTIN) 300 MG capsule, Take 1 capsule (300 mg total) by mouth 3 (three) times daily., Disp: 270 capsule, Rfl: 2 .  hydrALAZINE (APRESOLINE) 50 MG tablet, Take 1 tablet (50 mg total) by mouth 3 (three) times daily., Disp: 90 tablet, Rfl: 4 .  Insulin Glargine (LANTUS SOLOSTAR) 100 UNIT/ML Solostar Pen, INJECT 50 UNITS SUBCUTANEOUSLY DAILY, Disp: 5 pen, Rfl: 5 .  insulin lispro (HUMALOG KWIKPEN) 100 UNIT/ML KiwkPen, INJECT 5 TO 10 UNITS SUBCUTANEOUSLY BEFORE MEALS, Disp: 6 mL, Rfl: 5 .  Insulin Pen Needle 32G X 6 MM MISC, 1 each by Does not apply route 4 (four) times daily., Disp: 120 each, Rfl: 5 .  loratadine (CLARITIN) 10 MG tablet, TAKE 1 TABLET BY MOUTH EVERY DAY FOR ALLERGIES, Disp: 30 tablet, Rfl: 5 .  metoprolol (LOPRESSOR) 100 MG tablet, Take 1 tablet (100 mg total) by mouth 2 (two) times daily., Disp: 180 tablet, Rfl: 2 .  montelukast (SINGULAIR) 10 MG tablet, Take 1 tablet (10 mg total) by mouth daily., Disp: 90 tablet, Rfl: 2 .  nystatin (MYCOSTATIN/NYSTOP) 100000 UNIT/GM POWD, Apply 1  Bottle topically 3 (three) times daily., Disp: 1 Bottle, Rfl: 0 .  omeprazole (PRILOSEC) 40 MG capsule, Take 1 capsule (40 mg total) by mouth daily., Disp: 90 capsule, Rfl: 2 .  pimecrolimus (ELIDEL) 1 % cream, Apply topically 2 (two) times daily., Disp: 30 g, Rfl: 0 .  pravastatin (PRAVACHOL) 40 MG tablet, TAKE 1 TABLET BY MOUTH ONCE A DAY FOR CHOLESTEROL, Disp: 90 tablet, Rfl: 2 .  Vitamin D, Ergocalciferol, (DRISDOL) 50000 units CAPS capsule, TAKE 1 CAPSULE (50,000 UNITS TOTAL) BY MOUTH ONCE A WEEK., Disp: 4 capsule, Rfl: 4  Allergies  Allergen Reactions  . Ace Inhibitors Cough     ROS  Constitutional: Negative for fever or weight change.  Respiratory:  Negative for cough , positive for shortness of breath with mild activity .   Cardiovascular: Negative for chest pain or palpitations.  Gastrointestinal: Negative for abdominal pain, no bowel changes.  Musculoskeletal: Positive for gait problem and  joint swelling - bilateral knee.  Skin: Positive  for rash - face .  Neurological: Negative for dizziness or headache.  No other specific complaints in a complete review of systems (except as listed in HPI above).  Objective  Vitals:   08/21/16 1050  BP: 136/78  Pulse: 77  Resp: 18  Temp: 97.7 F (36.5 C)  SpO2: 94%  Weight: (!) 313 lb 1 oz (142 kg)  Height: 5\' 4"  (1.626 m)    Body mass index is 53.74 kg/m.  Physical Exam  Constitutional: Patient appears well-developed Obese No distress.  HEENT: head atraumatic, normocephalic, pupils equal and reactive to light,neck supple, throat within normal limits Cardiovascular: Normal rate, regular rhythm and normal heart sounds.  No murmur heard. Trace  BLE edema. Pulmonary/Chest: Effort normal and breath sounds normal. No respiratory distress. Abdominal: Soft.  There is no tenderness. Psychiatric: Patient has a normal mood and affect. behavior is normal. Judgment and thought content normal. Skin: facial dryness on the area of CPAP  machine , hyperpigmentation  Recent Results (from the past 2160 hour(s))  POCT HgB A1C     Status: Abnormal   Collection Time: 08/21/16 10:53 AM  Result Value Ref Range   Hemoglobin A1C 6.4     Diabetic Foot Exam: Diabetic Foot Exam - Simple   Simple Foot Form Diabetic Foot exam was performed with the following findings:  Yes 08/21/2016 11:36 AM  Visual Inspection See comments:  Yes Sensation Testing See comments:  Yes Pulse Check Posterior Tibialis and Dorsalis pulse intact bilaterally:  Yes Comments Dry and cracked skin, also decrease sensation       PHQ2/9: Depression screen [facility] 2/9 04/15/2016 11/14/2015 06/26/2015 02/26/2015  Decreased Interest 0 0 0 0  Down, Depressed, Hopeless 0 0 0 0  PHQ - 2 Score 0 0 0 0     Fall Risk: Fall Risk  04/15/2016 11/14/2015 06/26/2015 02/26/2015  Falls in the past year? No No No No     Assessment & Plan  1. Type 2 diabetes mellitus with nephropathy (HCC)  At goal, she is down to 30 units of Lantus daily  - POCT HgB A1C - insulin lispro (HUMALOG KWIKPEN) 100 UNIT/ML KiwkPen; INJECT 5 TO 10 UNITS SUBCUTANEOUSLY BEFORE MEALS  Dispense: 6 mL; Refill: 5 - Urine Microalbumin w/creat. ratio - CBC with Differential/Platelet - COMPLETE METABOLIC PANEL WITH GFR - VITAMIN D 25 Hydroxy (Vit-D Deficiency, Fractures) - Parathyroid hormone, intact (no Ca)  2. Obstructive apnea  She needs new supplies  3. Benign hypertension with CKD (chronic kidney disease) stage III  - amLODipine (NORVASC) 10 MG tablet; Take 1 tablet (10 mg total) by mouth daily.  Dispense: 90 tablet; Refill: 1 - metoprolol (LOPRESSOR) 100 MG tablet; Take 1 tablet (100 mg total) by mouth 2 (two) times daily.  Dispense: 180 tablet; Refill: 2  4. 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 Diastolic heart failure,  NYHA class 3 (HCC)  - furosemide (LASIX) 20 MG tablet; Take 1 tablet (20 mg total) by mouth 2 (two) times daily.  Dispense: 180 tablet; Refill: 1  6. Controlled type 2  diabetes mellitus with diabetic neuropathy, with long-term current use of insulin ([place])   7. Essential hypertension   8. Perennial allergic rhinitis with seasonal variation  - montelukast (SINGULAIR) 10 MG tablet; Take 1 tablet (10 mg total) by mouth daily.  Dispense: 90 tablet; Refill: 2  9. Gastro-esophageal reflux disease without esophagitis  - omeprazole (PRILOSEC) 40 MG capsule; Take 1 capsule (40 mg total) by mouth daily.  Dispense: 90 capsule; Refill: 2 Try to wean off by alternating with Ranitidine  10. Dyslipidemia  - pravastatin (PRAVACHOL) 40 MG tablet; TAKE 1 TABLET BY MOUTH ONCE A DAY FOR CHOLESTEROL  Dispense: 90 tablet; Refill: 2 - Lipid panel  11. Eczema of face  - pimecrolimus (ELIDEL) 1 % cream; Apply topically 2 (two) times daily.  Dispense: 30 g; Refill: 0

## 2016-08-22 ENCOUNTER — Other Ambulatory Visit: Payer: Self-pay

## 2016-08-22 DIAGNOSIS — L309 Dermatitis, unspecified: Secondary | ICD-10-CM

## 2016-08-22 LAB — VITAMIN D 25 HYDROXY (VIT D DEFICIENCY, FRACTURES): VIT D 25 HYDROXY: 53 ng/mL (ref 30–100)

## 2016-08-22 LAB — PARATHYROID HORMONE, INTACT (NO CA): PTH: 77 pg/mL — AB (ref 14–64)

## 2016-08-22 MED ORDER — DESONIDE 0.05 % EX OINT: 1.0000 "application " | TOPICAL_OINTMENT | Freq: Two times a day (BID) | CUTANEOUS | 0 refills | Status: DC

## 2016-08-22 NOTE — Telephone Encounter (Signed)
PA for Elidel was denied due to it being non-preferred. She has to try and fail one of the following:  Augmented Betamethasone Desonide Ointment Fluocinonide  Dr. Ancil Boozer stated that she could try Desonide Ointment.  Refill request was sent to Dr. Steele Sizer for approval and submission.

## 2016-09-10 ENCOUNTER — Other Ambulatory Visit: Payer: Self-pay | Admitting: Family Medicine

## 2016-09-10 DIAGNOSIS — I509 Heart failure, unspecified: Secondary | ICD-10-CM | POA: Diagnosis not present

## 2016-09-10 DIAGNOSIS — E559 Vitamin D deficiency, unspecified: Secondary | ICD-10-CM

## 2016-09-11 NOTE — Telephone Encounter (Signed)
[facility] 

## 2016-09-12 LAB — MICROALBUMIN / CREATININE URINE RATIO

## 2016-09-27 ENCOUNTER — Other Ambulatory Visit: Payer: Self-pay | Admitting: Family Medicine

## 2016-10-03 ENCOUNTER — Other Ambulatory Visit: Payer: Self-pay | Admitting: Family Medicine

## 2016-10-08 DIAGNOSIS — I509 Heart failure, unspecified: Secondary | ICD-10-CM | POA: Diagnosis not present

## 2016-11-03 ENCOUNTER — Other Ambulatory Visit: Payer: Self-pay

## 2016-11-03 MED ORDER — INSULIN GLARGINE 100 UNIT/ML SOLOSTAR PEN: PEN_INJECTOR | SUBCUTANEOUS | 1 refills | Status: DC

## 2016-11-03 NOTE — Telephone Encounter (Signed)
Patient requesting [facility]antus [facility] with a 90 day supply.

## 2016-11-07 ENCOUNTER — Inpatient Hospital Stay
Admission: EM | Admit: 2016-11-07 | Discharge: 2016-11-10 | DRG: 291 | Disposition: A | Discharge status: Home Health | Payer: Medicare Other | Attending: Internal Medicine | Admitting: Internal Medicine

## 2016-11-07 ENCOUNTER — Encounter: Payer: Self-pay | Admitting: Emergency Medicine

## 2016-11-07 ENCOUNTER — Inpatient Hospital Stay (HOSPITAL_COMMUNITY)
Admit: 2016-11-07 | Discharge: 2016-11-07 | Disposition: A | Discharge status: Home / Self Care | Payer: Medicare Other | Attending: Internal Medicine | Admitting: Internal Medicine

## 2016-11-07 ENCOUNTER — Emergency Department: Payer: Medicare Other

## 2016-11-07 DIAGNOSIS — Z888 Allergy status to other drugs, medicaments and biological substances status: Secondary | ICD-10-CM

## 2016-11-07 DIAGNOSIS — Z87891 Personal history of nicotine dependence: Secondary | ICD-10-CM | POA: Diagnosis not present

## 2016-11-07 DIAGNOSIS — E1122 Type 2 diabetes mellitus with diabetic chronic kidney disease: Secondary | ICD-10-CM | POA: Diagnosis not present

## 2016-11-07 DIAGNOSIS — I5033 Acute on chronic diastolic (congestive) heart failure: Secondary | ICD-10-CM | POA: Diagnosis not present

## 2016-11-07 DIAGNOSIS — Z7982 Long term (current) use of aspirin: Secondary | ICD-10-CM

## 2016-11-07 DIAGNOSIS — N179 Acute kidney failure, unspecified: Secondary | ICD-10-CM | POA: Diagnosis present

## 2016-11-07 DIAGNOSIS — I130 Hypertensive heart and chronic kidney disease with heart failure and stage 1 through stage 4 chronic kidney disease, or unspecified chronic kidney disease: Secondary | ICD-10-CM | POA: Diagnosis not present

## 2016-11-07 DIAGNOSIS — G4733 Obstructive sleep apnea (adult) (pediatric): Secondary | ICD-10-CM | POA: Diagnosis present

## 2016-11-07 DIAGNOSIS — I509 Heart failure, unspecified: Secondary | ICD-10-CM

## 2016-11-07 DIAGNOSIS — Z833 Family history of diabetes mellitus: Secondary | ICD-10-CM

## 2016-11-07 DIAGNOSIS — Z6841 Body Mass Index (BMI) 40.0 and over, adult: Secondary | ICD-10-CM

## 2016-11-07 DIAGNOSIS — Z794 Long term (current) use of insulin: Secondary | ICD-10-CM | POA: Diagnosis not present

## 2016-11-07 DIAGNOSIS — J9620 Acute and chronic respiratory failure, unspecified whether with hypoxia or hypercapnia: Secondary | ICD-10-CM | POA: Diagnosis not present

## 2016-11-07 DIAGNOSIS — N184 Chronic kidney disease, stage 4 (severe): Secondary | ICD-10-CM | POA: Diagnosis present

## 2016-11-07 DIAGNOSIS — R0602 Shortness of breath: Secondary | ICD-10-CM | POA: Diagnosis not present

## 2016-11-07 DIAGNOSIS — I110 Hypertensive heart disease with heart failure: Secondary | ICD-10-CM | POA: Diagnosis not present

## 2016-11-07 DIAGNOSIS — D649 Anemia, unspecified: Secondary | ICD-10-CM | POA: Diagnosis not present

## 2016-11-07 DIAGNOSIS — J9621 Acute and chronic respiratory failure with hypoxia: Secondary | ICD-10-CM | POA: Diagnosis not present

## 2016-11-07 DIAGNOSIS — D631 Anemia in chronic kidney disease: Secondary | ICD-10-CM | POA: Diagnosis present

## 2016-11-07 DIAGNOSIS — Z9981 Dependence on supplemental oxygen: Secondary | ICD-10-CM

## 2016-11-07 DIAGNOSIS — R0600 Dyspnea, unspecified: Secondary | ICD-10-CM | POA: Diagnosis not present

## 2016-11-07 LAB — CBC WITH DIFFERENTIAL/PLATELET
BASOS ABS: 0 10*3/uL (ref 0–0.1)
Basophils Relative: 0 %
EOS PCT: 0 %
Eosinophils Absolute: 0 10*3/uL (ref 0–0.7)
HCT: 26.3 % — ABNORMAL LOW (ref 35.0–47.0)
HEMOGLOBIN: 8.3 g/dL — AB (ref 12.0–16.0)
LYMPHS ABS: 0.7 10*3/uL — AB (ref 1.0–3.6)
LYMPHS PCT: 6 %
MCH: 22.5 pg — ABNORMAL LOW (ref 26.0–34.0)
MCHC: 31.4 g/dL — ABNORMAL LOW (ref 32.0–36.0)
MCV: 71.7 fL — AB (ref 80.0–100.0)
Monocytes Absolute: 0.8 10*3/uL (ref 0.2–0.9)
Monocytes Relative: 7 %
NEUTROS ABS: 10.1 10*3/uL — AB (ref 1.4–6.5)
NEUTROS PCT: 87 %
PLATELETS: 260 10*3/uL (ref 150–440)
RBC: 3.67 MIL/uL — AB (ref 3.80–5.20)
RDW: 18.4 % — ABNORMAL HIGH (ref 11.5–14.5)
WBC: 11.6 10*3/uL — AB (ref 3.6–11.0)

## 2016-11-07 LAB — BASIC METABOLIC PANEL
ANION GAP: 12 (ref 5–15)
BUN: 45 mg/dL — AB (ref 6–20)
CALCIUM: 8.7 mg/dL — AB (ref 8.9–10.3)
CO2: 29 mmol/L (ref 22–32)
CREATININE: 2.2 mg/dL — AB (ref 0.44–1.00)
Chloride: 102 mmol/L (ref 101–111)
GFR, EST AFRICAN AMERICAN: 25 mL/min — AB (ref >60)
GFR, EST NON AFRICAN AMERICAN: 21 mL/min — AB (ref >60)
GLUCOSE: 176 mg/dL — AB (ref 65–99)
POTASSIUM: 4.5 mmol/L (ref 3.5–5.1)
Sodium: 143 mmol/L (ref 135–145)

## 2016-11-07 LAB — TROPONIN I: TROPONIN I: 0.03 ng/mL — AB (ref <0.03)

## 2016-11-07 LAB — RETICULOCYTES
RBC.: 3.65 MIL/uL — AB (ref 3.80–5.20)
RETIC COUNT ABSOLUTE: 94.9 10*3/uL (ref 19.0–183.0)
RETIC CT PCT: 2.6 % (ref 0.4–3.1)

## 2016-11-07 LAB — IRON AND TIBC
IRON: 17 ug/dL — AB (ref 28–170)
Saturation Ratios: 6 % — ABNORMAL LOW (ref 10.4–31.8)
TIBC: 269 ug/dL (ref 250–450)
UIBC: 252 ug/dL

## 2016-11-07 LAB — FOLATE: Folate: 10.5 ng/mL (ref >5.9)

## 2016-11-07 LAB — VITAMIN B12: Vitamin B-12: 439 pg/mL (ref 180–914)

## 2016-11-07 LAB — GLUCOSE, CAPILLARY
Glucose-Capillary: 140 mg/dL — ABNORMAL HIGH (ref 65–99)
Glucose-Capillary: 76 mg/dL (ref 65–99)
Glucose-Capillary: 118 mg/dL — ABNORMAL HIGH (ref 65–99)

## 2016-11-07 LAB — BRAIN NATRIURETIC PEPTIDE: B Natriuretic Peptide: 722 pg/mL — ABNORMAL HIGH (ref 0.0–100.0)

## 2016-11-07 LAB — FERRITIN: FERRITIN: 331 ng/mL — AB (ref 11–307)

## 2016-11-07 MED ORDER — AMLODIPINE BESYLATE 10 MG PO TABS
10.0000 mg | ORAL_TABLET | Freq: Every day | ORAL | Status: DC
  Administered 2016-11-07 – 2016-11-10 (×4): 10 mg via ORAL
  Filled 2016-11-07 (×4): qty 1

## 2016-11-07 MED ORDER — SODIUM CHLORIDE 0.9% FLUSH
3.0000 mL | Freq: Two times a day (BID) | INTRAVENOUS | Status: DC
  Administered 2016-11-07 – 2016-11-10 (×7): 3 mL via INTRAVENOUS

## 2016-11-07 MED ORDER — ASPIRIN EC 81 MG PO TBEC
81.0000 mg | DELAYED_RELEASE_TABLET | Freq: Every day | ORAL | Status: DC
  Administered 2016-11-07 – 2016-11-09 (×3): 81 mg via ORAL
  Filled 2016-11-07 (×3): qty 1

## 2016-11-07 MED ORDER — ONDANSETRON HCL 4 MG PO TABS: 4.0000 mg | ORAL_TABLET | Freq: Four times a day (QID) | ORAL | Status: DC | PRN

## 2016-11-07 MED ORDER — ALBUTEROL SULFATE (2.5 MG/3ML) 0.083% IN NEBU: 2.5000 mg | INHALATION_SOLUTION | RESPIRATORY_TRACT | Status: DC | PRN

## 2016-11-07 MED ORDER — METOPROLOL TARTRATE 100 MG PO TABS
100.0000 mg | ORAL_TABLET | Freq: Two times a day (BID) | ORAL | Status: DC
  Administered 2016-11-07 – 2016-11-10 (×6): 100 mg via ORAL
  Filled 2016-11-07 (×7): qty 1

## 2016-11-07 MED ORDER — POLYETHYLENE GLYCOL 3350 17 G PO PACK: 17.0000 g | PACK | Freq: Every day | ORAL | Status: DC | PRN

## 2016-11-07 MED ORDER — FUROSEMIDE 10 MG/ML IJ SOLN
60.0000 mg | Freq: Three times a day (TID) | INTRAMUSCULAR | Status: DC
  Administered 2016-11-07 – 2016-11-10 (×9): 60 mg via INTRAVENOUS
  Filled 2016-11-07 (×10): qty 6

## 2016-11-07 MED ORDER — ACETAMINOPHEN 325 MG PO TABS
650.0000 mg | ORAL_TABLET | Freq: Four times a day (QID) | ORAL | Status: DC | PRN
  Administered 2016-11-09: 650 mg via ORAL
  Filled 2016-11-07: qty 2

## 2016-11-07 MED ORDER — FAMOTIDINE 20 MG PO TABS
20.0000 mg | ORAL_TABLET | Freq: Two times a day (BID) | ORAL | Status: DC
  Administered 2016-11-07 – 2016-11-10 (×7): 20 mg via ORAL
  Filled 2016-11-07 (×7): qty 1

## 2016-11-07 MED ORDER — ACETAMINOPHEN 650 MG RE SUPP: 650.0000 mg | Freq: Four times a day (QID) | RECTAL | Status: DC | PRN

## 2016-11-07 MED ORDER — MONTELUKAST SODIUM 10 MG PO TABS
10.0000 mg | ORAL_TABLET | Freq: Every day | ORAL | Status: DC
  Administered 2016-11-07 – 2016-11-10 (×4): 10 mg via ORAL
  Filled 2016-11-07 (×4): qty 1

## 2016-11-07 MED ORDER — ENOXAPARIN SODIUM 40 MG/0.4ML ~~LOC~~ SOLN
40.0000 mg | Freq: Two times a day (BID) | SUBCUTANEOUS | Status: DC
  Administered 2016-11-07 – 2016-11-10 (×7): 40 mg via SUBCUTANEOUS
  Filled 2016-11-07 (×7): qty 0.4

## 2016-11-07 MED ORDER — INSULIN ASPART 100 UNIT/ML ~~LOC~~ SOLN
0.0000 [IU] | Freq: Three times a day (TID) | SUBCUTANEOUS | Status: DC
  Administered 2016-11-07 – 2016-11-09 (×3): 1 [IU] via SUBCUTANEOUS
  Administered 2016-11-09: 2 [IU] via SUBCUTANEOUS
  Administered 2016-11-09: 1 [IU] via SUBCUTANEOUS
  Filled 2016-11-07: qty 1
  Filled 2016-11-07: qty 2
  Filled 2016-11-07 (×3): qty 1

## 2016-11-07 MED ORDER — CLONIDINE HCL 0.3 MG/24HR TD PTWK
0.3000 mg | MEDICATED_PATCH | TRANSDERMAL | Status: DC
  Administered 2016-11-09: 0.3 mg via TRANSDERMAL
  Filled 2016-11-07: qty 1

## 2016-11-07 MED ORDER — FERROUS SULFATE 325 (65 FE) MG PO TABS
325.0000 mg | ORAL_TABLET | Freq: Two times a day (BID) | ORAL | Status: DC
  Administered 2016-11-07 – 2016-11-10 (×6): 325 mg via ORAL
  Filled 2016-11-07 (×6): qty 1

## 2016-11-07 MED ORDER — FUROSEMIDE 10 MG/ML IJ SOLN
40.0000 mg | Freq: Once | INTRAMUSCULAR | Status: AC
  Administered 2016-11-07: 40 mg via INTRAVENOUS
  Filled 2016-11-07: qty 4

## 2016-11-07 MED ORDER — HYDRALAZINE HCL 50 MG PO TABS
50.0000 mg | ORAL_TABLET | Freq: Three times a day (TID) | ORAL | Status: DC
  Administered 2016-11-07 – 2016-11-10 (×10): 50 mg via ORAL
  Filled 2016-11-07 (×10): qty 1

## 2016-11-07 MED ORDER — INSULIN ASPART 100 UNIT/ML ~~LOC~~ SOLN
0.0000 [IU] | Freq: Every day | SUBCUTANEOUS | Status: DC
  Filled 2016-11-07: qty 5

## 2016-11-07 MED ORDER — GABAPENTIN 100 MG PO CAPS
200.0000 mg | ORAL_CAPSULE | Freq: Three times a day (TID) | ORAL | Status: DC
  Administered 2016-11-07 – 2016-11-10 (×10): 200 mg via ORAL
  Filled 2016-11-07 (×10): qty 2

## 2016-11-07 MED ORDER — IPRATROPIUM-ALBUTEROL 0.5-2.5 (3) MG/3ML IN SOLN
3.0000 mL | Freq: Once | RESPIRATORY_TRACT | Status: AC
  Administered 2016-11-07: 3 mL via RESPIRATORY_TRACT
  Filled 2016-11-07: qty 3

## 2016-11-07 MED ORDER — INSULIN GLARGINE 100 UNIT/ML ~~LOC~~ SOLN
35.0000 [IU] | Freq: Every day | SUBCUTANEOUS | Status: DC
  Administered 2016-11-09: 35 [IU] via SUBCUTANEOUS
  Filled 2016-11-07 (×4): qty 0.35

## 2016-11-07 MED ORDER — PRAVASTATIN SODIUM 20 MG PO TABS
40.0000 mg | ORAL_TABLET | Freq: Every day | ORAL | Status: DC
  Administered 2016-11-07 – 2016-11-09 (×3): 40 mg via ORAL
  Filled 2016-11-07 (×3): qty 2

## 2016-11-07 MED ORDER — ONDANSETRON HCL 4 MG/2ML IJ SOLN: 4.0000 mg | Freq: Four times a day (QID) | INTRAMUSCULAR | Status: DC | PRN

## 2016-11-07 MED ORDER — INSULIN ASPART 100 UNIT/ML ~~LOC~~ SOLN
4.0000 [IU] | Freq: Three times a day (TID) | SUBCUTANEOUS | Status: DC
  Administered 2016-11-07 – 2016-11-10 (×9): 4 [IU] via SUBCUTANEOUS
  Filled 2016-11-07 (×8): qty 4

## 2016-11-07 MED ORDER — ORAL CARE MOUTH RINSE
15.0000 mL | Freq: Two times a day (BID) | OROMUCOSAL | Status: DC
  Administered 2016-11-07 – 2016-11-09 (×3): 15 mL via OROMUCOSAL

## 2016-11-07 NOTE — Progress Notes (Signed)
PT Cancellation Note  Patient Details Name: Veronica Anderson MRN: [id number] DOB: 1946/07/08   Cancelled Treatment:     Pt states "Can't we just try this tomorrow, I didn't sleep well and need my nap."     Kreg Shropshire, DPT 11/07/2016, 4:00 PM

## 2016-11-07 NOTE — ED Provider Notes (Signed)
[facility] Emergency Department Provider Note  ____________________________________________   I have reviewed the triage vital signs and the nursing notes.   HISTORY  Chief Complaint Shortness of Breath and Weakness    HPI Veronica Anderson is a 71 y.o. female who has a history of morbid obesity retention diabetes home oxygen requirement of 2 L, states she's been feeling slightly more short of breath last few days. Her legs are more swollen than normal. She is taking her medications as indicated except for this morning. Every morning, they take her off her oxygen go to the bathroom this 21 needed that however she became very respiratorily compromised, and had sats in the 70s with EMS arrived. They placed her on oxygen she is doing better but she still feels short of breath. Patient has no chest pain, denies productive cough. She denies rectal bleeding.    Past Medical History:  Diagnosis Date  . Allergy   . Anemia   . Asthma   . Cataract    bilateral  . CHF (NYHA class II, ACC/AHA stage C) ([place])   . Chronic kidney disease    stage IV (severe), Dr. Aleene Davidson  . Constipation   . Diabetes mellitus without complication ([place])   . Edema    feet/ankles  . GERD (gastroesophageal reflux disease)   . Hyperlipidemia   . Hypertension   . Left ankle pain   . Lumbago   . Myocardial infarction ([place])   . Neuropathy   . Neuropathy   . OA (osteoarthritis)   . Obesity   . Obstructive sleep apnea syndrome    CPAP  . Paresthesia    Foot  . Protein calorie malnutrition ([place])   . Requires supplemental oxygen   . Stroke ([place])   . Vitamin D deficiency     Patient Active Problem List   Diagnosis Date Noted  . Nocturnal oxygen desaturation 06/26/2015  . Anemia in chronic illness 02/23/2015  . Asthma, mild intermittent 02/23/2015  . Benign essential HTN 02/23/2015  . Diastolic heart failure, NYHA class 3 ([place]) 02/23/2015  . Benign hypertension with chronic kidney  disease, stage III 02/23/2015  . CN (constipation) 02/23/2015  . Diabetes mellitus with neuropathy ([employer]) 02/23/2015  . Dyslipidemia 02/23/2015  . Elevated ferritin 02/23/2015  . Gastro-esophageal reflux disease without esophagitis 02/23/2015  . LBP (low back pain) 02/23/2015  . Extreme obesity 02/23/2015  . Obstructive apnea 02/23/2015  . Osteoarthritis 02/23/2015  . Neuropathy 02/23/2015  . Perennial allergic rhinitis with seasonal variation 02/23/2015  . History of pneumonia 02/23/2015  . Vitamin D deficiency 02/23/2015  . Cataract 04/16/2010  . Background diabetic retinopathy ([place]) 12/18/2009    Past Surgical History:  Procedure Laterality Date  . ANKLE SURGERY Left   . CATARACT EXTRACTION W/PHACO Right 12/20/2015   Procedure: CATARACT EXTRACTION PHACO AND INTRAOCULAR LENS PLACEMENT (IOC);  Surgeon: Eulogio Bear, MD;  Location: [facility] ORS;  Service: Ophthalmology;  Laterality: Right;  [place] 2.50 AP% 21.8 CDE 37.04 Fluid pack lot # [id number]  . CATARACT EXTRACTION W/PHACO Left 02/14/2016   Procedure: CATARACT EXTRACTION PHACO AND INTRAOCULAR LENS PLACEMENT (IOC);  Surgeon: Eulogio Bear, MD;  Location: [facility] ORS;  Service: Ophthalmology;  Laterality: Left;  [place] 3.12 AP% 42.1 CDE 47.12 Fluid Pack lot # [id number]  . COLONOSCOPY    . FRACTURE SURGERY Left    ankle    Prior to Admission medications   Medication Sig Start Date End Date Taking? Authorizing Provider  ACCU-CHEK AVIVA PLUS test  strip CHECK BLOOD SUGAR TWICE DAILY 10/03/16   Steele Sizer, MD  amLODipine (NORVASC) 10 MG tablet Take 1 tablet (10 mg total) by mouth daily. 08/21/16   Steele Sizer, MD  aspirin EC 81 MG tablet Take 81 mg by mouth at bedtime.    Historical Provider, MD  cloNIDine (CATAPRES-TTS-3) 0.3 mg/24hr patch Place 1 patch (0.3 mg total) onto the skin once a week. Pt applies patch on Sunday. 11/14/15   Steele Sizer, MD  desonide (DESOWEN) 0.05 % ointment Apply 1 application topically 2 (two) times daily.  08/22/16   Steele Sizer, MD  fluticasone (FLONASE) 50 MCG/ACT nasal spray USE 2 SPRAYS IN EACH NOSTRIL EVERY NIGHT AT BEDTIME 11/19/15   Steele Sizer, MD  furosemide (LASIX) 20 MG tablet Take 1 tablet (20 mg total) by mouth 2 (two) times daily. 08/21/16   Steele Sizer, MD  gabapentin (NEURONTIN) 300 MG capsule Take 1 capsule (300 mg total) by mouth 3 (three) times daily. 11/14/15   Steele Sizer, MD  hydrALAZINE (APRESOLINE) 50 MG tablet TAKE 1 TABLET (50 MG TOTAL) BY MOUTH 3 (THREE) TIMES DAILY. 09/28/16   Steele Sizer, MD  Insulin Glargine (LANTUS SOLOSTAR) 100 UNIT/ML Solostar Pen INJECT 50 UNITS SUBCUTANEOUSLY DAILY 11/03/16   Steele Sizer, MD  insulin lispro (HUMALOG KWIKPEN) 100 UNIT/ML KiwkPen INJECT 5 TO 10 UNITS SUBCUTANEOUSLY BEFORE MEALS 08/21/16   Steele Sizer, MD  Insulin Pen Needle 32G X 6 MM MISC 1 each by Does not apply route 4 (four) times daily. 04/15/16   Steele Sizer, MD  loratadine (CLARITIN) 10 MG tablet TAKE 1 TABLET BY MOUTH EVERY DAY FOR ALLERGIES 11/11/15   Steele Sizer, MD  metoprolol (LOPRESSOR) 100 MG tablet Take 1 tablet (100 mg total) by mouth 2 (two) times daily. 08/21/16   Steele Sizer, MD  montelukast (SINGULAIR) 10 MG tablet Take 1 tablet (10 mg total) by mouth daily. 08/21/16   Steele Sizer, MD  nystatin (MYCOSTATIN/NYSTOP) 100000 UNIT/GM POWD Apply 1 Bottle topically 3 (three) times daily. 03/31/15   Bettey Costa, MD  omeprazole (PRILOSEC) 40 MG capsule Take 1 capsule (40 mg total) by mouth daily. 08/21/16   Steele Sizer, MD  pravastatin (PRAVACHOL) 40 MG tablet TAKE 1 TABLET BY MOUTH ONCE A DAY FOR CHOLESTEROL 08/21/16   Steele Sizer, MD  ranitidine (ZANTAC) 150 MG tablet Take 1 tablet (150 mg total) by mouth 2 (two) times daily. Trying to wean off Omeprazole 08/21/16   Steele Sizer, MD  Vitamin D, Ergocalciferol, (DRISDOL) 50000 units CAPS capsule TAKE 1 CAPSULE (50,000 UNITS TOTAL) BY MOUTH ONCE A WEEK. 09/11/16   Steele Sizer, MD    Allergies Ace  inhibitors  Family History  Problem Relation Age of Onset  . Diabetes type II      Social History Social History  Substance Use Topics  . Smoking status: Former Smoker    Types: Cigarettes    Quit date: 07/14/1994  . Smokeless tobacco: Never Used  . Alcohol use No    Review of Systems Constitutional: No fever/chills Eyes: No visual changes. ENT: No sore throat. No stiff neck no neck pain Cardiovascular: Denies chest pain. Respiratory: Positive shortness of breath. Gastrointestinal:   no vomiting.  No diarrhea.  No constipation. Genitourinary: Negative for dysuria. Musculoskeletal: Positive acute on chronic lower extremity swelling Skin: Negative for rash. Neurological: Negative for severe headaches, focal weakness or numbness. 10-point ROS otherwise negative.  ____________________________________________   PHYSICAL EXAM:  VITAL SIGNS: ED Triage Vitals  Enc Vitals Group  BP 11/07/16 0723 (!) 154/73     Pulse Rate 11/07/16 0710 (!) 58     Resp 11/07/16 0710 (!) 22     Temp 11/07/16 0710 98.1 F (36.7 C)     Temp src --      SpO2 11/07/16 0710 98 %     Weight 11/07/16 0713 (!) 312 lb (141.5 kg)     Height 11/07/16 0713 5\' 4"  (1.626 m)     Head Circumference --      Peak Flow --      Pain Score 11/07/16 0708 8     Pain Loc --      Pain Edu? --      Excl. in [place]? --     Constitutional: Alert and oriented. Well appearing and in no acute distress. Eyes: Conjunctivae are normal. PERRL. EOMI. Head: Atraumatic. Nose: No congestion/rhinnorhea. Mouth/Throat: Mucous membranes are moist.  Oropharynx non-erythematous. Neck: No stridor.   Nontender with no meningismus Cardiovascular: Normal rate, regular rhythm. Grossly normal heart sounds.  Good peripheral circulation. Respiratory: Patient with bibasilar rails, no rhonchi, not wheezing Abdominal: Soft and nontender. No distention. No guarding no rebound Back:  There is no focal tenderness or step off.  there is no  midline tenderness there are no lesions noted. there is no CVA tenderness Rectal exam: Female nurse chaperone present, guaiac negative brown stool Musculoskeletal: No lower extremity tenderness, no upper extremity tenderness. No joint effusions, no DVT signs strong distal pulses 2-3+ bilateral symmetric  pitting edema Neurologic:  Normal speech and language. No gross focal neurologic deficits are appreciated.  Skin:  Skin is warm, dry and intact. No rash noted. Psychiatric: Mood and affect are normal. Speech and behavior are normal.  ____________________________________________   LABS (all labs ordered are listed, but only abnormal results are displayed)  Labs Reviewed  TROPONIN I - Abnormal; Notable for the following:       Result Value   Troponin I 0.03 (*)    All other components within normal limits  CBC WITH DIFFERENTIAL/PLATELET - Abnormal; Notable for the following:    WBC 11.6 (*)    RBC 3.67 (*)    Hemoglobin 8.3 (*)    HCT 26.3 (*)    MCV 71.7 (*)    MCH 22.5 (*)    MCHC 31.4 (*)    RDW 18.4 (*)    Neutro Abs 10.1 (*)    Lymphs Abs 0.7 (*)    All other components within normal limits  BRAIN NATRIURETIC PEPTIDE - Abnormal; Notable for the following:    B Natriuretic Peptide 722.0 (*)    All other components within normal limits  BASIC METABOLIC PANEL - Abnormal; Notable for the following:    Glucose, Bld 176 (*)    BUN 45 (*)    Creatinine, Ser 2.20 (*)    Calcium 8.7 (*)    GFR calc non Af Amer 21 (*)    GFR calc Af Amer 25 (*)    All other components within normal limits   ____________________________________________  EKG  I personally interpreted any EKGs ordered by me or triage Sinus rhythm rate 62 bpm, IVCD CD, borderline prolonged QT, no acute ischemic changes ____________________________________________  RADIOLOGY  I reviewed any imaging ordered by me or triage that were performed during my shift and, if possible, patient and/or family made aware of  any abnormal findings. ____________________________________________   PROCEDURES  Procedure(s) performed: None  Procedures  Critical Care performed: None  ____________________________________________  INITIAL IMPRESSION / ASSESSMENT AND PLAN / ED COURSE  Pertinent labs & imaging results that were available during my care of the patient were reviewed by me and considered in my medical decision making (see chart for details).  Patient here with some evidence of CHF, she apparently descended very rapidly which is unusual for her this morning, she is doing better on oxygen here. She is guaiac negative. There are multiple different issues today first is her hypoxia. I feel this is likely mediated by CHF. Patient has pulmonary edema on chest x-ray, and lower extremity edema which is worsened normal and a BNP which is elevated. No evidence of pneumonia PE or ACS at this time. In addition, patient has anemia which likely is not helping. She is guaiac-negative, cause of the anemia is not clear. Looks to be a gradually worsening process over time. I feel with all of these pathologies present, patient likely will need to be admitted to the hospital for diuresis. I'm starting Lasix.    ____________________________________________   FINAL CLINICAL IMPRESSION(S) / ED DIAGNOSES  Final diagnoses:  None      This chart was dictated using voice recognition software.  Despite best efforts to proofread,  errors can occur which can change meaning.      Schuyler Amor, MD 11/07/16 [phone]

## 2016-11-07 NOTE — Progress Notes (Signed)
Patient admitted to unit. Oriented to room, call bell, and staff. Bed in lowest position. Fall safety plan reviewed. Full assessment to Epic. Skin assessment verified with Serenity K. RN. Telemetry box verification with tele clerk and Natalie NT- Box#: [id number]. Son at bedside. Will continue to monitor.

## 2016-11-07 NOTE — Progress Notes (Signed)
Chaplain educated the patient regarding an Forensic scientist. Patient said that she will complete it when she will be ready.   11/07/16 1700  Clinical Encounter Type  Visited With Patient  Visit Type Initial  Referral From Nurse  Consult/Referral To Chaplain  Spiritual Encounters  Spiritual Needs Literature;Brochure

## 2016-11-07 NOTE — H&P (Signed)
[place] [facility] [place] NAME: Veronica Anderson    MR#:  [id number]  DATE OF BIRTH:  27-Jul-1945  DATE OF ADMISSION:  11/07/2016  PRIMARY CARE PHYSICIAN: Loistine Chance, MD   REQUESTING/REFERRING PHYSICIAN: Dr. Burlene Arnt  CHIEF COMPLAINT:   Chief Complaint  Patient presents with  . Shortness of Breath  . Weakness    HISTORY OF PRESENT ILLNESS:  Veronica Anderson  is a 71 y.o. female with a known history of DM, HTN, diastolic chf, chronic resp failure, CKD 4 here with SOB for 1 week with orthopnea and edema. Tried increased lasix at home due to weight gain but still worsened. CXR shows pulm edema.  PAST MEDICAL HISTORY:   Past Medical History:  Diagnosis Date  . Allergy   . Anemia   . Asthma   . Cataract    bilateral  . CHF (NYHA class II, ACC/AHA stage C) ([place])   . Chronic kidney disease    stage IV (severe), Dr. Aleene Davidson  . Constipation   . Diabetes mellitus without complication ([place])   . Edema    feet/ankles  . GERD (gastroesophageal reflux disease)   . Hyperlipidemia   . Hypertension   . Left ankle pain   . Lumbago   . Myocardial infarction ([place])   . Neuropathy   . Neuropathy   . OA (osteoarthritis)   . Obesity   . Obstructive sleep apnea syndrome    CPAP  . Paresthesia    Foot  . Protein calorie malnutrition ([place])   . Requires supplemental oxygen   . Stroke ([place])   . Vitamin D deficiency     PAST SURGICAL HISTORY:   Past Surgical History:  Procedure Laterality Date  . ANKLE SURGERY Left   . CATARACT EXTRACTION W/PHACO Right 12/20/2015   Procedure: CATARACT EXTRACTION PHACO AND INTRAOCULAR LENS PLACEMENT (IOC);  Surgeon: Eulogio Bear, MD;  Location: [facility] ORS;  Service: Ophthalmology;  Laterality: Right;  [place] 2.50 AP% 21.8 CDE 37.04 Fluid pack lot # [id number]  . CATARACT EXTRACTION W/PHACO Left 02/14/2016   Procedure: CATARACT EXTRACTION PHACO AND INTRAOCULAR LENS PLACEMENT (IOC);  Surgeon: Eulogio Bear, MD;   Location: [facility] ORS;  Service: Ophthalmology;  Laterality: Left;  [place] 3.12 AP% 42.1 CDE 47.12 Fluid Pack lot # [id number]  . COLONOSCOPY    . FRACTURE SURGERY Left    ankle    SOCIAL HISTORY:   Social History  Substance Use Topics  . Smoking status: Former Smoker    Types: Cigarettes    Quit date: 07/14/1994  . Smokeless tobacco: Never Used  . Alcohol use No    FAMILY HISTORY:   Family History  Problem Relation Age of Onset  . Diabetes type II      DRUG ALLERGIES:   Allergies  Allergen Reactions  . Ace Inhibitors Cough    REVIEW OF SYSTEMS:   Review of Systems  Constitutional: Positive for malaise/fatigue. Negative for chills and fever.  HENT: Negative for sore throat.   Eyes: Negative for blurred vision, double vision and pain.  Respiratory: Positive for cough and shortness of breath. Negative for hemoptysis and wheezing.   Cardiovascular: Positive for orthopnea and leg swelling. Negative for chest pain and palpitations.  Gastrointestinal: Negative for abdominal pain, constipation, diarrhea, heartburn, nausea and vomiting.  Genitourinary: Negative for dysuria and hematuria.  Musculoskeletal: Negative for back pain and joint pain.  Skin: Negative for rash.  Neurological: Positive for weakness. Negative for sensory change,  speech change, focal weakness and headaches.  Endo/Heme/Allergies: Does not bruise/bleed easily.  Psychiatric/Behavioral: Negative for depression. The patient is not nervous/anxious.     MEDICATIONS AT HOME:   Prior to Admission medications   Medication Sig Start Date End Date Taking? Authorizing Provider  amLODipine (NORVASC) 10 MG tablet Take 1 tablet (10 mg total) by mouth daily. 08/21/16  Yes Steele Sizer, MD  aspirin EC 81 MG tablet Take 81 mg by mouth at bedtime.   Yes Historical Provider, MD  cloNIDine (CATAPRES-TTS-3) 0.3 mg/24hr patch Place 1 patch (0.3 mg total) onto the skin once a week. Pt applies patch on Sunday. 11/14/15  Yes Steele Sizer, MD  desonide (DESOWEN) 0.05 % ointment Apply 1 application topically 2 (two) times daily. 08/22/16  Yes Steele Sizer, MD  furosemide (LASIX) 20 MG tablet Take 1 tablet (20 mg total) by mouth 2 (two) times daily. 08/21/16  Yes Steele Sizer, MD  gabapentin (NEURONTIN) 300 MG capsule Take 1 capsule (300 mg total) by mouth 3 (three) times daily. 11/14/15  Yes Steele Sizer, MD  hydrALAZINE (APRESOLINE) 50 MG tablet TAKE 1 TABLET (50 MG TOTAL) BY MOUTH 3 (THREE) TIMES DAILY. 09/28/16  Yes Steele Sizer, MD  Insulin Glargine (LANTUS SOLOSTAR) 100 UNIT/ML Solostar Pen INJECT 50 UNITS SUBCUTANEOUSLY DAILY Patient taking differently: Inject 42 Units into the skin daily at 10 pm.  11/03/16  Yes Steele Sizer, MD  insulin lispro (HUMALOG KWIKPEN) 100 UNIT/ML KiwkPen INJECT 5 TO 10 UNITS SUBCUTANEOUSLY BEFORE MEALS Patient taking differently: Inject 5 Units into the skin 3 (three) times daily.  08/21/16  Yes Steele Sizer, MD  loratadine (CLARITIN) 10 MG tablet TAKE 1 TABLET BY MOUTH EVERY DAY FOR ALLERGIES 11/11/15  Yes Steele Sizer, MD  metoprolol (LOPRESSOR) 100 MG tablet Take 1 tablet (100 mg total) by mouth 2 (two) times daily. 08/21/16  Yes Steele Sizer, MD  montelukast (SINGULAIR) 10 MG tablet Take 1 tablet (10 mg total) by mouth daily. 08/21/16  Yes Steele Sizer, MD  pravastatin (PRAVACHOL) 40 MG tablet TAKE 1 TABLET BY MOUTH ONCE A DAY FOR CHOLESTEROL 08/21/16  Yes Steele Sizer, MD  ranitidine (ZANTAC) 150 MG tablet Take 1 tablet (150 mg total) by mouth 2 (two) times daily. Trying to wean off Omeprazole 08/21/16  Yes Steele Sizer, MD  Vitamin D, Ergocalciferol, (DRISDOL) 50000 units CAPS capsule TAKE 1 CAPSULE (50,000 UNITS TOTAL) BY MOUTH ONCE A WEEK. 09/11/16  Yes Steele Sizer, MD  ACCU-CHEK AVIVA PLUS test strip CHECK BLOOD SUGAR TWICE DAILY 10/03/16   Steele Sizer, MD  fluticasone (FLONASE) 50 MCG/ACT nasal spray USE 2 SPRAYS IN EACH NOSTRIL EVERY NIGHT AT BEDTIME Patient not taking:  Reported on 11/07/2016 11/19/15   Steele Sizer, MD  Insulin Pen Needle 32G X 6 MM MISC 1 each by Does not apply route 4 (four) times daily. 04/15/16   Steele Sizer, MD  nystatin (MYCOSTATIN/NYSTOP) 100000 UNIT/GM POWD Apply 1 Bottle topically 3 (three) times daily. Patient not taking: Reported on 11/07/2016 03/31/15   Bettey Costa, MD  omeprazole (PRILOSEC) 40 MG capsule Take 1 capsule (40 mg total) by mouth daily. Patient not taking: Reported on 11/07/2016 08/21/16   Steele Sizer, MD     VITAL SIGNS:  Blood pressure (!) 154/73, pulse (!) 58, temperature 98.1 F (36.7 C), resp. rate (!) 22, height 5\' 4"  (1.626 m), weight (!) 141.5 kg (312 lb), SpO2 98 %.  PHYSICAL EXAMINATION:  Physical Exam  GENERAL:  71 y.o.-year-old patient lying in the bed  with no acute distress. Morbidly obese EYES: Pupils equal, round, reactive to light and accommodation. No scleral icterus. Extraocular muscles intact.  HEENT: Head atraumatic, normocephalic. Oropharynx and nasopharynx clear. No oropharyngeal erythema, moist oral mucosa  NECK:  Supple, no jugular venous distention. No thyroid enlargement, no tenderness.  LUNGS: clear. Increased WOB CARDIOVASCULAR: S1, S2 normal. No murmurs, rubs, or gallops.  ABDOMEN: Soft, nontender, nondistended. Bowel sounds present. No organomegaly or mass.  EXTREMITIES: No pedal edema, cyanosis, or clubbing. + 2 pedal & radial pulses b/l.   NEUROLOGIC: Cranial nerves II through XII are intact. No focal Motor or sensory deficits appreciated b/l PSYCHIATRIC: The patient is alert and oriented x 3. Good affect.  SKIN: No obvious rash, lesion, or ulcer.   LABORATORY PANEL:   CBC  Recent Labs Lab 11/07/16 0713  WBC 11.6*  HGB 8.3*  HCT 26.3*  PLT 260   ------------------------------------------------------------------------------------------------------------------  Chemistries   Recent Labs Lab 11/07/16 0713  NA 143  K 4.5  CL 102  CO2 29  GLUCOSE 176*  BUN 45*   CREATININE 2.20*  CALCIUM 8.7*   ------------------------------------------------------------------------------------------------------------------  Cardiac Enzymes  Recent Labs Lab 11/07/16 0713  TROPONINI 0.03*   ------------------------------------------------------------------------------------------------------------------  RADIOLOGY:  Dg Chest 2 View  Result Date: 11/07/2016 CLINICAL DATA:  Shortness of breath.  CHF.  History of pneumonia. EXAM: CHEST  2 VIEW COMPARISON:  03/29/2015 . FINDINGS: Cardiomegaly with mild bilateral interstitial prominence consistent with CHF. Pneumonitis cannot be excluded. Small bilateral pleural effusions cannot be excluded. No pneumothorax. IMPRESSION: Congestive heart failure with mild bilateral pulmonary interstitial edema. Small bilateral pleural effusions cannot be excluded . Electronically Signed   By: Marcello Moores  Register   On: 11/07/2016 08:01     IMPRESSION AND PLAN:   * Acute on chronic diastolic chf with acute on chronic resp failure - IV Lasix, Beta blockers - Input and Output - Counseled to limit fluids and Salt - Monitor Bun/Cr and Potassium - Echo -Cardiology follow up after discharge  * AKI over CKD4 likely cardiorenal syndrome Monitor while diuresing Needs nephrology f/u after discharge  * HTN Continue home meds  * DVT prophylaxis Lovenox  All the records are reviewed and case discussed with ED provider. Management plans discussed with the patient, family and they are in agreement.  CODE STATUS: FULL CODE  TOTAL TIME TAKING CARE OF THIS PATIENT: 40 minutes.   Hillary Bow R M.D on 11/07/2016 at 9:52 AM  Between 7am to 6pm - Pager - [phone]  After 6pm go to [email or web address] - password [email or web address]  Office  [phone]  CC: Primary care physician; Loistine Chance, MD  Note: This dictation was prepared with Dragon dictation along with smaller phrase technology. Any  transcriptional errors that result from this process are unintentional.

## 2016-11-07 NOTE — ED Triage Notes (Signed)
Pt ems from home for SOB, weakness. Per pt she was up to bathroom without O2 and got weak. Per ems pt O2 sats were in 70'S RA when they arrived. Sats improved on 4LNC.

## 2016-11-07 NOTE — Progress Notes (Signed)
Anticoagulation monitoring(Lovenox):  71yo female ordered Lovenox 30 mg Q24h  Filed Weights   11/07/16 0713  Weight: (!) 312 lb (141.5 kg)   BMI 53.3   Lab Results  Component Value Date   CREATININE 2.20 (H) 11/07/2016   CREATININE 1.52 (H) 08/21/2016   CREATININE 1.68 (H) 11/14/2015   Estimated Creatinine Clearance: 33.6 mL/min (A) (by C-G formula based on SCr of 2.2 mg/dL (H)). Hemoglobin & Hematocrit     Component Value Date/Time   HGB 8.3 (L) 11/07/2016 0713   HGB 7.6 (L) 04/11/2014 0427   HCT 26.3 (L) 11/07/2016 0713   HCT 38.0 11/14/2015 1023     Per Protocol for Patient with estCrcl > 30 ml/min and BMI > 40, will transition to Lovenox 40 mg Q12h.

## 2016-11-08 LAB — BASIC METABOLIC PANEL
ANION GAP: 7 (ref 5–15)
BUN: 45 mg/dL — ABNORMAL HIGH (ref 6–20)
CHLORIDE: 103 mmol/L (ref 101–111)
CO2: 34 mmol/L — ABNORMAL HIGH (ref 22–32)
CREATININE: 1.93 mg/dL — AB (ref 0.44–1.00)
Calcium: 8.7 mg/dL — ABNORMAL LOW (ref 8.9–10.3)
GFR calc non Af Amer: 25 mL/min — ABNORMAL LOW (ref >60)
GFR, EST AFRICAN AMERICAN: 29 mL/min — AB (ref >60)
Glucose, Bld: 102 mg/dL — ABNORMAL HIGH (ref 65–99)
POTASSIUM: 3.9 mmol/L (ref 3.5–5.1)
SODIUM: 144 mmol/L (ref 135–145)

## 2016-11-08 LAB — ECHOCARDIOGRAM COMPLETE
Height: 64 in
WEIGHTICAEL: 4992 [oz_av]

## 2016-11-08 LAB — CBC
HEMATOCRIT: 24 % — AB (ref 35.0–47.0)
Hemoglobin: 7.4 g/dL — ABNORMAL LOW (ref 12.0–16.0)
MCH: 21.8 pg — ABNORMAL LOW (ref 26.0–34.0)
MCHC: 31 g/dL — ABNORMAL LOW (ref 32.0–36.0)
MCV: 70.5 fL — AB (ref 80.0–100.0)
PLATELETS: 257 10*3/uL (ref 150–440)
RBC: 3.41 MIL/uL — ABNORMAL LOW (ref 3.80–5.20)
RDW: 18.5 % — ABNORMAL HIGH (ref 11.5–14.5)
WBC: 9.8 10*3/uL (ref 3.6–11.0)

## 2016-11-08 LAB — GLUCOSE, CAPILLARY
GLUCOSE-CAPILLARY: 129 mg/dL — AB (ref 65–99)
Glucose-Capillary: 105 mg/dL — ABNORMAL HIGH (ref 65–99)
Glucose-Capillary: 97 mg/dL (ref 65–99)

## 2016-11-08 NOTE — NC FL2 (Signed)
[place] LEVEL OF CARE SCREENING TOOL     IDENTIFICATION  Patient Name: Veronica Anderson Birthdate: April 01, 1946 Sex: female Admission Date (Current Location): 11/07/2016  [facility] and [place] Number:  Engineering geologist and Address:  [facility], [address], [place], [place] [address]      Provider Number: [id number]  Attending Physician Name and Address:  Hillary Bow, MD  Relative Name and Phone Number:       Current Level of Care: Hospital Recommended Level of Care: [place] Prior Approval Number:    Date Approved/Denied:   PASRR Number:    Discharge Plan: SNF    Current Diagnoses: Patient Active Problem List   Diagnosis Date Noted  . CHF (congestive heart failure) ([place]) 11/07/2016  . Nocturnal oxygen desaturation 06/26/2015  . Anemia in chronic illness 02/23/2015  . Asthma, mild intermittent 02/23/2015  . Benign essential HTN 02/23/2015  . Diastolic heart failure, NYHA class 3 ([place]) 02/23/2015  . Benign hypertension with chronic kidney disease, stage III 02/23/2015  . CN (constipation) 02/23/2015  . Diabetes mellitus with neuropathy ([place]) 02/23/2015  . Dyslipidemia 02/23/2015  . Elevated ferritin 02/23/2015  . Gastro-esophageal reflux disease without esophagitis 02/23/2015  . LBP (low back pain) 02/23/2015  . Extreme obesity 02/23/2015  . Obstructive apnea 02/23/2015  . Osteoarthritis 02/23/2015  . Neuropathy 02/23/2015  . Perennial allergic rhinitis with seasonal variation 02/23/2015  . History of pneumonia 02/23/2015  . Vitamin D deficiency 02/23/2015  . Cataract 04/16/2010  . Background diabetic retinopathy ([place]) 12/18/2009    Orientation RESPIRATION BLADDER Height & Weight     Self, Time, Situation  O2 (4L o2) Incontinent Weight: (!) 313 lb 1.6 oz (142 kg) Height:  5\' 4"  (162.6 cm)  BEHAVIORAL SYMPTOMS/MOOD NEUROLOGICAL BOWEL NUTRITION STATUS      Continent Diet (Heart healthy, )   AMBULATORY STATUS COMMUNICATION OF NEEDS Skin   Extensive Assist Verbally Normal                       Personal Care Assistance Level of Assistance  Bathing, Feeding, Dressing Bathing Assistance: Limited assistance Feeding assistance: Independent Dressing Assistance: Limited assistance     Functional Limitations Info             SPECIAL CARE FACTORS FREQUENCY  PT (By licensed PT)     PT Frequency: Up to 5X per day, 5 days per week              Contractures Contractures Info: Present    Additional Factors Info  Allergies   Allergies Info: Ace Inhibitors           Current Medications (11/08/2016):  This is the current hospital active medication list Current Facility-Administered Medications  Medication Dose Route Frequency Provider Last Rate Last Dose  . acetaminophen (TYLENOL) tablet 650 mg  650 mg Oral Q6H PRN Hillary Bow, MD       Or  . acetaminophen (TYLENOL) suppository 650 mg  650 mg Rectal Q6H PRN Srikar Sudini, MD      . albuterol (PROVENTIL) (2.5 MG/3ML) 0.083% nebulizer solution 2.5 mg  2.5 mg Nebulization Q2H PRN Srikar Sudini, MD      . amLODipine (NORVASC) tablet 10 mg  10 mg Oral Daily Hillary Bow, MD   10 mg at 11/08/16 8527  . aspirin EC tablet 81 mg  81 mg Oral QHS Hillary Bow, MD   81 mg at 11/07/16 2253  . [START ON  11/09/2016] cloNIDine (CATAPRES - Dosed in mg/24 hr) patch 0.3 mg  0.3 mg Transdermal Weekly Srikar Sudini, MD      . enoxaparin (LOVENOX) injection 40 mg  40 mg Subcutaneous BID Hillary Bow, MD   40 mg at 11/08/16 0851  . famotidine (PEPCID) tablet 20 mg  20 mg Oral BID Hillary Bow, MD   20 mg at 11/08/16 0852  . ferrous sulfate tablet 325 mg  325 mg Oral BID WC Hillary Bow, MD   325 mg at 11/08/16 1707  . furosemide (LASIX) injection 60 mg  60 mg Intravenous Q8H Srikar Sudini, MD   60 mg at 11/08/16 1406  . gabapentin (NEURONTIN) capsule 200 mg  200 mg Oral TID Hillary Bow, MD   200 mg at 11/08/16 1707  .  hydrALAZINE (APRESOLINE) tablet 50 mg  50 mg Oral TID Hillary Bow, MD   50 mg at 11/08/16 1707  . insulin aspart (novoLOG) injection 0-5 Units  0-5 Units Subcutaneous QHS Srikar Sudini, MD      . insulin aspart (novoLOG) injection 0-9 Units  0-9 Units Subcutaneous TID WC Hillary Bow, MD   1 Units at 11/08/16 1707  . insulin aspart (novoLOG) injection 4 Units  4 Units Subcutaneous TID AC Hillary Bow, MD   4 Units at 11/08/16 1710  . insulin glargine (LANTUS) injection 35 Units  35 Units Subcutaneous Q2200 Srikar Sudini, MD      . MEDLINE mouth rinse  15 mL Mouth Rinse BID Hillary Bow, MD   15 mL at 11/07/16 2253  . metoprolol (LOPRESSOR) tablet 100 mg  100 mg Oral BID Hillary Bow, MD   100 mg at 11/08/16 0852  . montelukast (SINGULAIR) tablet 10 mg  10 mg Oral Daily Hillary Bow, MD   10 mg at 11/08/16 0852  . ondansetron (ZOFRAN) tablet 4 mg  4 mg Oral Q6H PRN Hillary Bow, MD       Or  . ondansetron (ZOFRAN) injection 4 mg  4 mg Intravenous Q6H PRN Srikar Sudini, MD      . polyethylene glycol (MIRALAX / GLYCOLAX) packet 17 g  17 g Oral Daily PRN Srikar Sudini, MD      . pravastatin (PRAVACHOL) tablet 40 mg  40 mg Oral q1800 Hillary Bow, MD   40 mg at 11/08/16 1707  . sodium chloride flush (NS) 0.9 % injection 3 mL  3 mL Intravenous Q12H Srikar Sudini, MD   3 mL at 11/08/16 1000     Discharge Medications: Please see discharge summary for a list of discharge medications.  Relevant Imaging Results:  Relevant Lab Results:   Additional Information SS# [id number]  Zettie Pho, LCSW

## 2016-11-08 NOTE — Progress Notes (Signed)
Physical Therapy Evaluation Patient Details Name: Veronica Anderson MRN: [id number] DOB: 05-11-1946 Today's Date: 11/08/2016   History of Present Illness  Patient is a 71 y.o. female admitted on 21 APR for increasing SOB x1 week. PMH includes DMII, HTN, diastolic CHF, chronic RF on 2 L home O2, CKDIV, and CVA.  Clinical Impression  Patient admitted for above listed reasons. Upon evaluation, patient with son at bedside. Patient previously modified independent with SPC within household, living with two sons. Patient demonstrates significant weakness, detailed below, limiting ROM and functional mobility. Patient required moderate-maximal assistance to move to EOB, requesting to "try standing another day." Because patient is not at baseline level of function, she will benefit from skilled and progressive PT during hospital stay but also upon d/c when medically ready. She will likely also require more supportive AD due to detailed weakness/impairments.    Follow Up Recommendations SNF    Equipment Recommendations  Rolling walker with 5" wheels    Recommendations for Other Services       Precautions / Restrictions Precautions Precautions: Fall Restrictions Weight Bearing Restrictions: No      Mobility  Bed Mobility Overal bed mobility: Needs Assistance Bed Mobility: Supine to Sit;Sit to Supine     Supine to sit: Mod assist;HOB elevated Sit to supine: Max assist;+2 for physical assistance;HOB elevated   General bed mobility comments: Patient required moderate assistance to move from supine to sit, wanting to pull up using her R UE and her son's hand. Required moderate assistance to scoot to EOB. Once sitting, patient said, "We will stand another time" and began returning to supine. Required +2 to scoot to [facility].  Transfers                 General transfer comment: Patient refused  Ambulation/Gait                Stairs            Wheelchair Mobility    Modified  Rankin (Stroke Patients Only)       Balance Overall balance assessment: Needs assistance Sitting-balance support: Feet supported;Bilateral upper extremity supported Sitting balance-Leahy Scale: Fair                                       Pertinent Vitals/Pain Pain Assessment: No/denies pain    Home Living Family/patient expects to be discharged to:: Private residence Living Arrangements: Children Available Help at Discharge: Family;Available PRN/intermittently Type of Home: House Home Access: Stairs to enter;Ramped entrance Entrance Stairs-Rails:  (Patient uses SPC on ramp.) Entrance Stairs-Number of Steps: 5 Home Layout: One level Home Equipment: Cane - single point;Bedside commode      Prior Function Level of Independence: Needs assistance   Gait / Transfers Assistance Needed: Performed with SPC for household distances  ADL's / Homemaking Assistance Needed: Performed by two sons        Hand Dominance        Extremity/Trunk Assessment   Upper Extremity Assessment Upper Extremity Assessment: Generalized weakness    Lower Extremity Assessment Lower Extremity Assessment: Generalized weakness;LLE deficits/detail LLE Deficits / Details: R LE grossly 3+/5, L LE 3-5       Communication   Communication: No difficulties  Cognition Arousal/Alertness: Lethargic Behavior During Therapy: Flat affect Overall Cognitive Status: Within Functional Limits for tasks assessed  General Comments      Exercises     Assessment/Plan    PT Assessment Patient needs continued PT services  PT Problem List Decreased strength;Decreased range of motion;Decreased activity tolerance;Decreased mobility;Decreased balance;Decreased knowledge of use of DME;Decreased safety awareness;Cardiopulmonary status limiting activity;Obesity       PT Treatment Interventions DME instruction;Gait training;Functional mobility  training;Therapeutic activities;Therapeutic exercise;Patient/family education    PT Goals (Current goals can be found in the Care Plan section)  Acute Rehab PT Goals Patient Stated Goal: "To feel better" PT Goal Formulation: With patient/family Time For Goal Achievement: 11/22/16 Potential to Achieve Goals: Fair    Frequency Min 2X/week   Barriers to discharge Inaccessible home environment;Decreased caregiver support      Co-evaluation               End of Session Equipment Utilized During Treatment: Oxygen Activity Tolerance: Patient limited by fatigue Patient left: in bed;with call bell/phone within reach;with bed alarm set;with family/visitor present   PT Visit Diagnosis: Muscle weakness (generalized) (M62.81);Difficulty in walking, not elsewhere classified (R26.2)    Time: 1102-1117 PT Time Calculation (min) (ACUTE ONLY): 14 min   Charges:   PT Evaluation $PT Eval Low Complexity: 1 Procedure     PT G Codes:          Dorice Lamas, PT, DPT 11/08/2016, 3:35 PM

## 2016-11-08 NOTE — Progress Notes (Signed)
[place] at [place] NAME: Veronica Anderson    MR#:  [id number]  DATE OF BIRTH:  1946-05-17  SUBJECTIVE:  CHIEF COMPLAINT:   Chief Complaint  Patient presents with  . Shortness of Breath  . Weakness   Continues to have shortness of breath. On oxygen. Dry cough. Orthopnea. Weak.  REVIEW OF SYSTEMS:    Review of Systems  Constitutional: Positive for malaise/fatigue. Negative for chills and fever.  HENT: Negative for sore throat.   Eyes: Negative for blurred vision, double vision and pain.  Respiratory: Positive for cough and shortness of breath. Negative for hemoptysis and wheezing.   Cardiovascular: Positive for orthopnea and leg swelling. Negative for chest pain and palpitations.  Gastrointestinal: Negative for abdominal pain, constipation, diarrhea, heartburn, nausea and vomiting.  Genitourinary: Negative for dysuria and hematuria.  Musculoskeletal: Negative for back pain and joint pain.  Skin: Negative for rash.  Neurological: Positive for weakness. Negative for sensory change, speech change, focal weakness and headaches.  Endo/Heme/Allergies: Does not bruise/bleed easily.  Psychiatric/Behavioral: Negative for depression. The patient is not nervous/anxious.     DRUG ALLERGIES:   Allergies  Allergen Reactions  . Ace Inhibitors Cough    VITALS:  Blood pressure (!) 151/72, pulse 64, temperature 99.3 F (37.4 C), temperature source Oral, resp. rate 19, height 5\' 4"  (1.626 m), weight (!) 142 kg (313 lb 1.6 oz), SpO2 100 %.  PHYSICAL EXAMINATION:   Physical Exam  GENERAL:  71 y.o.-year-old patient lying in the bed with. Conversational dyspnea. Morbidly obese. EYES: Pupils equal, round, reactive to light and accommodation. No scleral icterus. Extraocular muscles intact.  HEENT: Head atraumatic, normocephalic. Oropharynx and nasopharynx clear.  NECK:  Supple, no jugular venous distention. No thyroid enlargement, no tenderness.   LUNGS: Normal breath sounds bilaterally, no wheezing, rales, rhonchi. No use of accessory muscles of respiration.  CARDIOVASCULAR: S1, S2 normal. No murmurs, rubs, or gallops.  ABDOMEN: Soft, nontender, nondistended. Bowel sounds present. No organomegaly or mass.  EXTREMITIES: No cyanosis, clubbing or edema b/l.    NEUROLOGIC: Cranial nerves II through XII are intact. No focal Motor or sensory deficits b/l.   PSYCHIATRIC: The patient is alert and oriented x 3.  SKIN: No obvious rash, lesion, or ulcer.   LABORATORY PANEL:   CBC  Recent Labs Lab 11/08/16 0602  WBC 9.8  HGB 7.4*  HCT 24.0*  PLT 257   ------------------------------------------------------------------------------------------------------------------ Chemistries   Recent Labs Lab 11/08/16 0602  NA 144  K 3.9  CL 103  CO2 34*  GLUCOSE 102*  BUN 45*  CREATININE 1.93*  CALCIUM 8.7*   ------------------------------------------------------------------------------------------------------------------  Cardiac Enzymes  Recent Labs Lab 11/07/16 0713  TROPONINI 0.03*   ------------------------------------------------------------------------------------------------------------------  RADIOLOGY:  Dg Chest 2 View  Result Date: 11/07/2016 CLINICAL DATA:  Shortness of breath.  CHF.  History of pneumonia. EXAM: CHEST  2 VIEW COMPARISON:  03/29/2015 . FINDINGS: Cardiomegaly with mild bilateral interstitial prominence consistent with CHF. Pneumonitis cannot be excluded. Small bilateral pleural effusions cannot be excluded. No pneumothorax. IMPRESSION: Congestive heart failure with mild bilateral pulmonary interstitial edema. Small bilateral pleural effusions cannot be excluded . Electronically Signed   By: Marcello Moores  Register   On: 11/07/2016 08:01     ASSESSMENT AND PLAN:   * Acute on chronic diastolic chf with acute on chronic resp failure - IV Lasix, Beta blockers - Input and Output - Counseled to limit fluids and  Salt - Monitor Bun/Cr and Potassium - Echo -  pendning -Cardiology follow up after discharge  Patient is weak and unable to get out of bed and has been voiding  in bed. Likely has more urine output none documented.  * AKI over CKD4 likely cardiorenal syndrome Monitor while diuresing Needs nephrology f/u after discharge  * HTN Continue home meds  * DVT prophylaxis Lovenox  All the records are reviewed and case discussed with Care Management/Social Worker Management plans discussed with the patient, family and they are in agreement.  CODE STATUS: FULL CODE  DVT Prophylaxis: SCDs  TOTAL TIME TAKING CARE OF THIS PATIENT: 30 minutes.   POSSIBLE D/C IN 1-2 DAYS, DEPENDING ON CLINICAL CONDITION.  Hillary Bow R M.D on 11/08/2016 at 11:01 AM  Between 7am to 6pm - Pager - [phone]  After 6pm go to [email or web address] - password [email or web address] [place] [facility]  Office  [phone]  CC: Primary care physician; Loistine Chance, MD  Note: This dictation was prepared with Dragon dictation along with smaller phrase technology. Any transcriptional errors that result from this process are unintentional.

## 2016-11-09 LAB — BASIC METABOLIC PANEL
Anion gap: 11 (ref 5–15)
BUN: 40 mg/dL — AB (ref 6–20)
CALCIUM: 9 mg/dL (ref 8.9–10.3)
CO2: 37 mmol/L — ABNORMAL HIGH (ref 22–32)
CREATININE: 1.88 mg/dL — AB (ref 0.44–1.00)
Chloride: 100 mmol/L — ABNORMAL LOW (ref 101–111)
GFR calc Af Amer: 30 mL/min — ABNORMAL LOW (ref >60)
GFR, EST NON AFRICAN AMERICAN: 26 mL/min — AB (ref >60)
GLUCOSE: 144 mg/dL — AB (ref 65–99)
Potassium: 3.7 mmol/L (ref 3.5–5.1)
SODIUM: 148 mmol/L — AB (ref 135–145)

## 2016-11-09 LAB — GLUCOSE, CAPILLARY
Glucose-Capillary: 128 mg/dL — ABNORMAL HIGH (ref 65–99)
Glucose-Capillary: 144 mg/dL — ABNORMAL HIGH (ref 65–99)
Glucose-Capillary: 145 mg/dL — ABNORMAL HIGH (ref 65–99)
Glucose-Capillary: 176 mg/dL — ABNORMAL HIGH (ref 65–99)
Glucose-Capillary: 143 mg/dL — ABNORMAL HIGH (ref 65–99)

## 2016-11-09 LAB — HEMOGLOBIN: Hemoglobin: 8.1 g/dL — ABNORMAL LOW (ref 12.0–16.0)

## 2016-11-09 NOTE — Progress Notes (Signed)
[place] at [place] NAME: Veronica Anderson    MR#:  [id number]  DATE OF BIRTH:  [birth year]  SUBJECTIVE:  CHIEF COMPLAINT:   Chief Complaint  Patient presents with  . Shortness of Breath  . Weakness   Continues to have shortness of breath. On oxygen. Dry cough. Orthopnea. Weak.  REVIEW OF SYSTEMS:    Review of Systems  Constitutional: Positive for malaise/fatigue. Negative for chills and fever.  HENT: Negative for sore throat.   Eyes: Negative for blurred vision, double vision and pain.  Respiratory: Positive for cough and shortness of breath. Negative for hemoptysis and wheezing.   Cardiovascular: Positive for orthopnea and leg swelling. Negative for chest pain and palpitations.  Gastrointestinal: Negative for abdominal pain, constipation, diarrhea, heartburn, nausea and vomiting.  Genitourinary: Negative for dysuria and hematuria.  Musculoskeletal: Negative for back pain and joint pain.  Skin: Negative for rash.  Neurological: Positive for weakness. Negative for sensory change, speech change, focal weakness and headaches.  Endo/Heme/Allergies: Does not bruise/bleed easily.  Psychiatric/Behavioral: Negative for depression. The patient is not nervous/anxious.     DRUG ALLERGIES:   Allergies  Allergen Reactions  . Ace Inhibitors Cough    VITALS:  Blood pressure (!) 164/55, pulse 68, temperature 98.2 F (36.8 C), temperature source Oral, resp. rate 19, height 5\' 4"  (1.626 m), weight (!) 142.5 kg (314 lb 1.6 oz), SpO2 97 %.  PHYSICAL EXAMINATION:   Physical Exam  GENERAL:  71 y.o.-year-old patient lying in the bed with. Conversational dyspnea. Morbidly obese. EYES: Pupils equal, round, reactive to light and accommodation. No scleral icterus. Extraocular muscles intact.  HEENT: Head atraumatic, normocephalic. Oropharynx and nasopharynx clear.  NECK:  Supple, no jugular venous distention. No thyroid enlargement, no tenderness.   LUNGS: Normal breath sounds bilaterally, no wheezing, rales, rhonchi. No use of accessory muscles of respiration.  CARDIOVASCULAR: S1, S2 normal. No murmurs, rubs, or gallops.  ABDOMEN: Soft, nontender, nondistended. Bowel sounds present. No organomegaly or mass.  EXTREMITIES: No cyanosis, clubbing or edema b/l.    NEUROLOGIC: Cranial nerves II through XII are intact. No focal Motor or sensory deficits b/l.   PSYCHIATRIC: The patient is alert and oriented x 3.  SKIN: No obvious rash, lesion, or ulcer.   LABORATORY PANEL:   CBC  Recent Labs Lab 11/08/16 0602 11/09/16 0729  WBC 9.8  --   HGB 7.4* 8.1*  HCT 24.0*  --   PLT 257  --    ------------------------------------------------------------------------------------------------------------------ Chemistries   Recent Labs Lab 11/09/16 0717  NA 148*  K 3.7  CL 100*  CO2 37*  GLUCOSE 144*  BUN 40*  CREATININE 1.88*  CALCIUM 9.0   ------------------------------------------------------------------------------------------------------------------  Cardiac Enzymes  Recent Labs Lab 11/07/16 0713  TROPONINI 0.03*   ------------------------------------------------------------------------------------------------------------------  RADIOLOGY:  No results found.   ASSESSMENT AND PLAN:   * Acute on chronic diastolic chf with acute on chronic resp failure - improving. On 2 L oxygen today. - IV Lasix, Beta blockers - Input and Output - Counseled to limit fluids and Salt - Monitor Bun/Cr and Potassium - Echo - Normal ejection fraction of 22% with diastolic dysfunction -Cardiology follow up after discharge Will need 1 more day of inpatient diuresis. Likely discharge tomorrow.  * AKI over CKD4 likely cardiorenal syndrome Monitor while diuresing Needs nephrology f/u after discharge  * Anemia of chronic disease likely from CKD. Stable  * HTN Continue home meds  * DVT prophylaxis Lovenox  All the  records are  reviewed and case discussed with Care Management/Social Worker Management plans discussed with the patient, family and they are in agreement.  CODE STATUS: FULL CODE  DVT Prophylaxis: SCDs  TOTAL TIME TAKING CARE OF THIS PATIENT: 30 minutes.   Possible discharge tomorrow. PT has recommended skilled nursing facility. Patient wants to go home with home health. Does have home health nurse at this time.  Hillary Bow R M.D on 11/09/2016 at 10:31 AM  Between 7am to 6pm - Pager - [phone]  After 6pm go to [email or web address] - password [email or web address] [facility]  Office  [phone]  CC: Primary care physician; Loistine Chance, MD  Note: This dictation was prepared with Dragon dictation along with smaller phrase technology. Any transcriptional errors that result from this process are unintentional.

## 2016-11-09 NOTE — Clinical Social Work Note (Signed)
CSW met with the patient and her son [name] at bedside to discuss discharge planning. The patient's son reported that he and his mother are in agreement to decline SNF placement. CSW signing off. Please consult should any additional needs arise.  Santiago Bumpers, MSW, Latanya Presser [phone]

## 2016-11-10 LAB — BASIC METABOLIC PANEL
Anion gap: 8 (ref 5–15)
BUN: 35 mg/dL — AB (ref 6–20)
CHLORIDE: 98 mmol/L — AB (ref 101–111)
CO2: 39 mmol/L — ABNORMAL HIGH (ref 22–32)
CREATININE: 1.97 mg/dL — AB (ref 0.44–1.00)
Calcium: 8.9 mg/dL (ref 8.9–10.3)
GFR calc Af Amer: 28 mL/min — ABNORMAL LOW (ref >60)
GFR, EST NON AFRICAN AMERICAN: 25 mL/min — AB (ref >60)
GLUCOSE: 96 mg/dL (ref 65–99)
POTASSIUM: 3.5 mmol/L (ref 3.5–5.1)
Sodium: 145 mmol/L (ref 135–145)

## 2016-11-10 LAB — GLUCOSE, CAPILLARY
GLUCOSE-CAPILLARY: 112 mg/dL — AB (ref 65–99)
GLUCOSE-CAPILLARY: 113 mg/dL — AB (ref 65–99)

## 2016-11-10 MED ORDER — HYDRALAZINE HCL 50 MG PO TABS: 50.0000 mg | ORAL_TABLET | Freq: Four times a day (QID) | ORAL | 4 refills | Status: DC

## 2016-11-10 NOTE — Care Management (Signed)
patient presents from home.  has medicaid pcs 7 days a week and the support of two sons.  Patient has access to a cane and walker.  [facility] provides [facility] home 02.  Is declining skilled nursing facility placement.  Agreeable to home health nurse and physical therapy.  Referral to [facility] and start of care will be Wednesday or Thursday. Son denies there will be any issues getting patient into the house

## 2016-11-10 NOTE — Progress Notes (Signed)
Physical Therapy Treatment Patient Details Name: Veronica Anderson MRN: [id number] DOB: 11/19/1945 Today's Date: 11/10/2016    History of Present Illness Patient is a 71 y.o. female admitted on 38 APR for increasing SOB x1 week. PMH includes DMII, HTN, diastolic CHF, chronic RF on 2 L home O2, CKDIV, and CVA.    PT Comments    Pt was watching TV with her son at bedside upon entry. Pt agreeable to PT session. Pt required mod assist of 1 to roll in bed to get on and off of the bed pan at beginning of session. Pt required mod assist of 2 for bed mobility, transfers, and pre-gait marching in place. Pt was limited by fatigue with activity; pt only able to tolerate standing and marching in place x1 before stating "that is enough for today." Pts HR remained between 75-78 BPM during session. PT will continue working to increase activity tolerance with functional mobility and attempt ambulation during next session. Current recommendations remain appropriate     Follow Up Recommendations  SNF     Equipment Recommendations  Rolling walker with 5" wheels (Pt reports that she has BSC, w/c, SPC, 2W and 4W RW)    Recommendations for Other Services       Precautions / Restrictions Precautions Precautions: Fall Restrictions Weight Bearing Restrictions: No    Mobility  Bed Mobility Overal bed mobility: Needs Assistance Bed Mobility: Supine to Sit;Sit to Supine     Supine to sit: Mod assist;+2 for physical assistance Sit to supine: Mod assist;+2 for physical assistance   General bed mobility comments: Pt required mod assist of one for rolling in bed to get on/off the bed pan with use of the bed rails; of 2 to sit EOB using one person to pull up with L UE and a second person to help raise her shoulders and mod assist fo one to scoot to the EOB; pt required mod assist of 2 to get back to bed for trunk and LE's; for scooting up in bed, pt pushed with her legs and pulled with her arms on the bed rails to  assist with mod assit of 2  Transfers Overall transfer level: Needs assistance Equipment used: Rolling walker (2 wheeled) Transfers: Sit to/from Stand Sit to Stand: Mod assist;+2 physical assistance         General transfer comment: pt stood using both hands on RW and required mod assist of 2 to stand  Ambulation/Gait             General Gait Details: Not assessed d/t pt fatigue. Pre-gait assessed with marching in place at bedside, pt required min assist of 2 to maintain her balance and started out lifting her feet very slowly, but able to increase speed after 3 trials; pt fatigued quickly requesting to sit back down. When asked to perform a second trial after a sitting rest break pt refused stating "that was enough for today."   Stairs            Wheelchair Mobility    Modified Rankin (Stroke Patients Only)       Balance Overall balance assessment: Needs assistance Sitting-balance support: Bilateral upper extremity supported;Feet unsupported Sitting balance-Leahy Scale: Fair Sitting balance - Comments: Pt required CGA sitting EOB for safety   Standing balance support: Bilateral upper extremity supported Standing balance-Leahy Scale: Poor Standing balance comment: Pt unable to stand up tall and leaning back against the bed with her legs initially; v/c's provided to stand up tall and look  ahead and pt able to stand w/o leaning on the bed with min assist                            Cognition Arousal/Alertness: Awake/alert Behavior During Therapy: [facility] for tasks assessed/performed Overall Cognitive Status: Within Functional Limits for tasks assessed                                        Exercises      General Comments General comments (skin integrity, edema, etc.): Pt requested to use the bed pan at beginning of session; nurse tech assisted with getting pt on/off bed pan and helped put clean sheets on the bed while pt stood       Pertinent Vitals/Pain Pain Assessment: No/denies pain    Home Living                      Prior Function            PT Goals (current goals can now be found in the care plan section) Acute Rehab PT Goals Patient Stated Goal: "To feel better" PT Goal Formulation: With patient/family Time For Goal Achievement: 11/22/16 Potential to Achieve Goals: Fair Progress towards PT goals: Progressing toward goals    Frequency    Min 2X/week      PT Plan Current plan remains appropriate    Co-evaluation              AM-PAC PT "6 Clicks" Daily Activity  Outcome Measure  Difficulty turning over in bed (including adjusting bedclothes, sheets and blankets)?: Total Difficulty moving from lying on back to sitting on the side of the bed? : Total Difficulty sitting down on and standing up from a chair with arms (e.g., wheelchair, bedside commode, etc,.)?: Total Help needed moving to and from a bed to chair (including a wheelchair)?: A Lot Help needed walking in hospital room?: Total Help needed climbing 3-5 steps with a railing? : Total 6 Click Score: 7    End of Session Equipment Utilized During Treatment: Oxygen (2L) Activity Tolerance: Patient limited by fatigue Patient left: in bed;with call bell/phone within reach;with bed alarm set;with family/visitor present (pts son at bedside) Nurse Communication: Mobility status (pt voided in bed pan) PT Visit Diagnosis: Muscle weakness (generalized) (M62.81);Difficulty in walking, not elsewhere classified (R26.2)     Time: 9628-3662 PT Time Calculation (min) (ACUTE ONLY): 27 min  Charges:  $Therapeutic Activity: 23-37 mins                    G Codes:        Catheryn Slifer, SPT 11/10/2016, 1:01 PM

## 2016-11-10 NOTE — Care Management Important Message (Signed)
Important Message  Patient Details  Name: Veronica Anderson MRN: [id number] Date of Birth: 1946/06/25   Medicare Important Message Given:  Yes    Katrina Stack, RN 11/10/2016, 3:00 PM

## 2016-11-10 NOTE — Progress Notes (Signed)
Discharge instructions reviewed with patient and son, including medication changes and monitoring for worsening CHF.  Understanding was verbalized and all questions were answered.  Awaiting son to bring patient's home portable O2 for transport home.

## 2016-11-10 NOTE — Consult Note (Signed)
[facility] Nurse wound consult note Reason for Consult: Intertriginous dermatitis, inframammary region.  Will add skin fold management with wicking feature.  Wound type:Moisture assocataed skin damage.  Pressure Injury POA: N/A Measurement: Erythema beneath bilateral breasts, more pronounced on the left.  Wound FIE:PPIR and moist Drainage (amount, consistency, odor) scant serous weeping.  Periwound:intat Dressing procedure/placement/frequency:Interdry AG skin fold management:  Measure and cut length of InterDry Ag+ to fit in skin folds that have skin breakdown Tuck InterDry  Ag+ fabric into skin folds in a single layer, allow for 2 inches of overhang from skin edges to allow for wicking to occur May remove to bathe; dry area thoroughly and then tuck into affected areas again Do not apply any creams or ointments when using InterDry Ag+ DO NOT THROW AWAY FOR 5 DAYS unless soiled with stool DO NOT [facility] product, this will inactivate the silver in the material  New sheet of Interdry Ag+ should be applied after 5 days of use if patient continues to have skin breakdown    Will not follow at this time.  Please re-consult if needed.  Domenic Moras RN BSN [place] Pager [phone]

## 2016-11-10 NOTE — Consult Note (Signed)
   [facility] Inpatient Consult   11/10/2016  [name] May 30, 1946 [id number]    [facility] Management referral received. Telephone call into patient's room to speak with her prior to discharge about [facility] Management services. [name] asked writer to discuss [facility] with her son. Spoke with son [name]. Writer then explained [facility] Management services to son. [name] states, "can we let you know if we need these services after we get home?" Provided [facility] Management office number to son [name] and asked him to call if they should change their mind regarding [facility] Management services. Will make inpatient RNCM aware patient declined [facility] Management program.    Marthenia Rolling, MSN-Ed, RN,BSN [facility] Liaison [phone]

## 2016-11-11 DIAGNOSIS — J4520 Mild intermittent asthma, uncomplicated: Secondary | ICD-10-CM | POA: Diagnosis not present

## 2016-11-11 DIAGNOSIS — Z9842 Cataract extraction status, left eye: Secondary | ICD-10-CM | POA: Diagnosis not present

## 2016-11-11 DIAGNOSIS — Z9841 Cataract extraction status, right eye: Secondary | ICD-10-CM | POA: Diagnosis not present

## 2016-11-11 DIAGNOSIS — Z794 Long term (current) use of insulin: Secondary | ICD-10-CM | POA: Diagnosis not present

## 2016-11-11 DIAGNOSIS — E1140 Type 2 diabetes mellitus with diabetic neuropathy, unspecified: Secondary | ICD-10-CM | POA: Diagnosis not present

## 2016-11-11 DIAGNOSIS — I130 Hypertensive heart and chronic kidney disease with heart failure and stage 1 through stage 4 chronic kidney disease, or unspecified chronic kidney disease: Secondary | ICD-10-CM | POA: Diagnosis not present

## 2016-11-11 DIAGNOSIS — Z8673 Personal history of transient ischemic attack (TIA), and cerebral infarction without residual deficits: Secondary | ICD-10-CM | POA: Diagnosis not present

## 2016-11-11 DIAGNOSIS — G4734 Idiopathic sleep related nonobstructive alveolar hypoventilation: Secondary | ICD-10-CM | POA: Diagnosis not present

## 2016-11-11 DIAGNOSIS — N184 Chronic kidney disease, stage 4 (severe): Secondary | ICD-10-CM | POA: Diagnosis not present

## 2016-11-11 DIAGNOSIS — I5033 Acute on chronic diastolic (congestive) heart failure: Secondary | ICD-10-CM | POA: Diagnosis not present

## 2016-11-11 DIAGNOSIS — J9620 Acute and chronic respiratory failure, unspecified whether with hypoxia or hypercapnia: Secondary | ICD-10-CM | POA: Diagnosis not present

## 2016-11-11 NOTE — Discharge Summary (Signed)
[facility] NAME: Krystiana Fornes    MR#:  [id number]  DATE OF BIRTH:  1946/01/25  DATE OF ADMISSION:  11/07/2016   ADMITTING PHYSICIAN: Hillary Bow, MD  DATE OF DISCHARGE: 11/10/2016  5:32 PM  PRIMARY CARE PHYSICIAN: Loistine Chance, MD   ADMISSION DIAGNOSIS:  Shortness of breath [R06.02] Anemia, unspecified type [D64.9] Acute on chronic congestive heart failure, unspecified heart failure type ([place]) [I50.9] DISCHARGE DIAGNOSIS:  Active Problems:   CHF (congestive heart failure) ([place])  SECONDARY DIAGNOSIS:   Past Medical History:  Diagnosis Date  . Allergy   . Anemia   . Asthma   . Cataract    bilateral  . CHF (NYHA class II, ACC/AHA stage C) ([place])   . Chronic kidney disease    stage IV (severe), Dr. Aleene Davidson  . Constipation   . Diabetes mellitus without complication ([place])   . Edema    feet/ankles  . GERD (gastroesophageal reflux disease)   . Hyperlipidemia   . Hypertension   . Left ankle pain   . Lumbago   . Myocardial infarction ([place])   . Neuropathy   . Neuropathy   . OA (osteoarthritis)   . Obesity   . Obstructive sleep apnea syndrome    CPAP  . Paresthesia    Foot  . Protein calorie malnutrition ([place])   . Requires supplemental oxygen   . Stroke ([place])   . Vitamin D deficiency    HOSPITAL COURSE:  71 y.o. female with a known history of DM, HTN, diastolic chf, chronic resp failure, CKD 4 here with SOB for 1 week with orthopnea and edema. Tried increased lasix at home due to weight gain but still worsened. CXR shows pulm edema.  Patient was admitted for  * Acute on chronic diastolic chf with acute on chronic resp failure -diuresed aggressively while in the hospital.  She is close to her baseline.  * AKI over CKD4 likely cardiorenal syndrome: Will need close monitoring of renal function as an outpatient  DISCHARGE CONDITIONS:  Fair CONSULTS OBTAINED:   DRUG ALLERGIES:   Allergies  Allergen  Reactions  . Ace Inhibitors Cough   DISCHARGE MEDICATIONS:   Allergies as of 11/10/2016      Reactions   Ace Inhibitors Cough      Medication List    TAKE these medications   ACCU-CHEK AVIVA PLUS test strip Generic drug:  glucose blood CHECK BLOOD SUGAR TWICE DAILY   amLODipine 10 MG tablet Commonly known as:  NORVASC Take 1 tablet (10 mg total) by mouth daily.   aspirin EC 81 MG tablet Take 81 mg by mouth at bedtime.   cloNIDine 0.3 mg/24hr patch Commonly known as:  CATAPRES-TTS-3 Place 1 patch (0.3 mg total) onto the skin once a week. Pt applies patch on Sunday.   desonide 0.05 % ointment Commonly known as:  DESOWEN Apply 1 application topically 2 (two) times daily.   fluticasone 50 MCG/ACT nasal spray Commonly known as:  FLONASE USE 2 SPRAYS IN EACH NOSTRIL EVERY NIGHT AT BEDTIME   furosemide 20 MG tablet Commonly known as:  LASIX Take 1 tablet (20 mg total) by mouth 2 (two) times daily.   gabapentin 300 MG capsule Commonly known as:  NEURONTIN Take 1 capsule (300 mg total) by mouth 3 (three) times daily.   hydrALAZINE 50 MG tablet Commonly known as:  APRESOLINE Take 1 tablet (50 mg total) by  mouth 4 (four) times daily. What changed:  when to take this   Insulin Glargine 100 UNIT/ML Solostar Pen Commonly known as:  LANTUS SOLOSTAR INJECT 50 UNITS SUBCUTANEOUSLY DAILY What changed:  how much to take  how to take this  when to take this  additional instructions   insulin lispro 100 UNIT/ML KiwkPen Commonly known as:  HUMALOG KWIKPEN INJECT 5 TO 10 [place] What changed:  how much to take  how to take this  when to take this  additional instructions   Insulin Pen Needle 32G X 6 MM Misc 1 each by Does not apply route 4 (four) times daily.   loratadine 10 MG tablet Commonly known as:  CLARITIN TAKE 1 TABLET BY MOUTH EVERY DAY FOR ALLERGIES   metoprolol 100 MG tablet Commonly known as:  LOPRESSOR Take 1 tablet  (100 mg total) by mouth 2 (two) times daily.   montelukast 10 MG tablet Commonly known as:  SINGULAIR Take 1 tablet (10 mg total) by mouth daily.   nystatin powder Generic drug:  nystatin Apply 1 Bottle topically 3 (three) times daily.   omeprazole 40 MG capsule Commonly known as:  PRILOSEC Take 1 capsule (40 mg total) by mouth daily.   pravastatin 40 MG tablet Commonly known as:  PRAVACHOL TAKE 1 TABLET BY MOUTH ONCE A DAY FOR CHOLESTEROL   ranitidine 150 MG tablet Commonly known as:  ZANTAC Take 1 tablet (150 mg total) by mouth 2 (two) times daily. Trying to wean off Omeprazole   Vitamin D (Ergocalciferol) 50000 units Caps capsule Commonly known as:  DRISDOL TAKE 1 CAPSULE (50,000 UNITS TOTAL) BY MOUTH ONCE A WEEK.      DISCHARGE INSTRUCTIONS:   DIET:  Cardiac diet DISCHARGE CONDITION:  Good ACTIVITY:  Activity as tolerated OXYGEN:  Home Oxygen: No.  Oxygen Delivery: room air DISCHARGE LOCATION:  home with home health  If you experience worsening of your admission symptoms, develop shortness of breath, life threatening emergency, suicidal or homicidal thoughts you must seek medical attention immediately by calling 911 or calling your MD immediately  if symptoms less severe.  You Must read complete instructions/literature along with all the possible adverse reactions/side effects for all the Medicines you take and that have been prescribed to you. Take any new Medicines after you have completely understood and accpet all the possible adverse reactions/side effects.   Please note  You were cared for by a hospitalist during your hospital stay. If you have any questions about your discharge medications or the care you received while you were in the hospital after you are discharged, you can call the unit and asked to speak with the hospitalist on call if the hospitalist that took care of you is not available. Once you are discharged, your primary care physician will  handle any further medical issues. Please note that NO REFILLS for any discharge medications will be authorized once you are discharged, as it is imperative that you return to your primary care physician (or establish a relationship with a primary care physician if you do not have one) for your aftercare needs so that they can reassess your need for medications and monitor your lab values.    On the day of Discharge:  VITAL SIGNS:  Blood pressure (!) 153/51, pulse (!) 59, temperature 98.6 F (37 C), temperature source Oral, resp. rate 18, height 5\' 4"  (1.626 m), weight (!) 142.5 kg (314 lb 1.6 oz), SpO2 100 %. PHYSICAL EXAMINATION:  GENERAL:  71 y.o.-year-old patient lying in the bed with no acute distress.  EYES: Pupils equal, round, reactive to light and accommodation. No scleral icterus. Extraocular muscles intact.  HEENT: Head atraumatic, normocephalic. Oropharynx and nasopharynx clear.  NECK:  Supple, no jugular venous distention. No thyroid enlargement, no tenderness.  LUNGS: Normal breath sounds bilaterally, no wheezing, rales,rhonchi or crepitation. No use of accessory muscles of respiration.  CARDIOVASCULAR: S1, S2 normal. No murmurs, rubs, or gallops.  ABDOMEN: Soft, non-tender, non-distended. Bowel sounds present. No organomegaly or mass.  EXTREMITIES: No pedal edema, cyanosis, or clubbing.  NEUROLOGIC: Cranial nerves II through XII are intact. Muscle strength 5/5 in all extremities. Sensation intact. Gait not checked.  PSYCHIATRIC: The patient is alert and oriented x 3.  SKIN: No obvious rash, lesion, or ulcer.  DATA REVIEW:   CBC  Recent Labs Lab 11/08/16 0602 11/09/16 0729  WBC 9.8  --   HGB 7.4* 8.1*  HCT 24.0*  --   PLT 257  --     Chemistries   Recent Labs Lab 11/10/16 0752  NA 145  K 3.5  CL 98*  CO2 39*  GLUCOSE 96  BUN 35*  CREATININE 1.97*  CALCIUM 8.9    Follow-up Information    [facility] Follow up.   Specialty:  Home Health  Services Why:  Home health nurse and physical therapy.  will start 5/2 or 5/3 Contact information: [address] [place] [address] [phone]        Loistine Chance, MD. Go on 11/20/2016.   Specialty:  Family Medicine Why:  at 10:20am Contact information: [address] [place] [address] [phone]        Yolonda Kida, MD. Go on 11/18/2016.   Specialties:  Cardiology, Internal Medicine Why:  at 10:15am Contact information: [place] [address] [phone]           Management plans discussed with the patient, family and they are in agreement.  CODE STATUS: Prior   TOTAL TIME TAKING CARE OF THIS PATIENT: 45 minutes.    Max Sane M.D on 11/11/2016 at 4:36 PM  Between 7am to 6pm - Pager - [phone]  After 6pm go to [email or web address] - password [email or web address] [facility]  [facility]  Office  [phone]  CC: Primary care physician; Loistine Chance, MD   Note: This dictation was prepared with Dragon dictation along with smaller phrase technology. Any transcriptional errors that result from this process are unintentional.

## 2016-11-12 ENCOUNTER — Other Ambulatory Visit: Payer: Self-pay

## 2016-11-12 ENCOUNTER — Telehealth: Payer: Self-pay

## 2016-11-12 MED ORDER — INSULIN GLARGINE 100 UNIT/ML SOLOSTAR PEN: PEN_INJECTOR | SUBCUTANEOUS | 1 refills | Status: DC

## 2016-11-12 NOTE — Telephone Encounter (Signed)
Pt son answered the phone and stated that the patient was asleep. He will have her return the call.

## 2016-11-12 NOTE — Telephone Encounter (Signed)
Patient requesting refill of Lantus with a 90 day supply.

## 2016-11-13 ENCOUNTER — Telehealth: Payer: Self-pay

## 2016-11-13 ENCOUNTER — Other Ambulatory Visit: Payer: Self-pay | Admitting: Family Medicine

## 2016-11-13 DIAGNOSIS — E559 Vitamin D deficiency, unspecified: Secondary | ICD-10-CM

## 2016-11-13 NOTE — Telephone Encounter (Signed)
[facility] 

## 2016-11-13 NOTE — Telephone Encounter (Signed)
Scarlett, RN from [facility] called stating patient husband and her do not want to receive home health anymore. They told the Nurse they know what they need to eat for her CHF and how to take care of her. Due to the patient and husband denying Home health they have to discharge her from their services and wanted Dr. Ancil Boozer to be informed.

## 2016-11-17 NOTE — Telephone Encounter (Signed)
Spoke with Veronica Anderson about establishing care she states that she does not want an appointment at this time.

## 2016-11-20 ENCOUNTER — Ambulatory Visit: Payer: Medicare Other | Admitting: Family Medicine

## 2016-11-26 ENCOUNTER — Other Ambulatory Visit: Payer: Self-pay | Admitting: Family Medicine

## 2016-11-26 DIAGNOSIS — E1140 Type 2 diabetes mellitus with diabetic neuropathy, unspecified: Secondary | ICD-10-CM

## 2016-11-26 DIAGNOSIS — Z794 Long term (current) use of insulin: Principal | ICD-10-CM

## 2016-12-08 DIAGNOSIS — I509 Heart failure, unspecified: Secondary | ICD-10-CM | POA: Diagnosis not present

## 2016-12-23 ENCOUNTER — Ambulatory Visit (INDEPENDENT_AMBULATORY_CARE_PROVIDER_SITE_OTHER): Payer: Medicare Other | Admitting: Family Medicine

## 2016-12-23 ENCOUNTER — Encounter: Payer: Self-pay | Admitting: Family Medicine

## 2016-12-23 VITALS — BP 132/68 | HR 74 | Temp 97.4°F | Resp 16 | Ht 64.0 in | Wt 304.0 lb

## 2016-12-23 DIAGNOSIS — I5030 Unspecified diastolic (congestive) heart failure: Secondary | ICD-10-CM

## 2016-12-23 DIAGNOSIS — E1140 Type 2 diabetes mellitus with diabetic neuropathy, unspecified: Secondary | ICD-10-CM

## 2016-12-23 DIAGNOSIS — E785 Hyperlipidemia, unspecified: Secondary | ICD-10-CM

## 2016-12-23 DIAGNOSIS — N184 Chronic kidney disease, stage 4 (severe): Secondary | ICD-10-CM

## 2016-12-23 DIAGNOSIS — R9431 Abnormal electrocardiogram [ECG] [EKG]: Secondary | ICD-10-CM | POA: Diagnosis not present

## 2016-12-23 DIAGNOSIS — L309 Dermatitis, unspecified: Secondary | ICD-10-CM

## 2016-12-23 DIAGNOSIS — G4733 Obstructive sleep apnea (adult) (pediatric): Secondary | ICD-10-CM

## 2016-12-23 DIAGNOSIS — I129 Hypertensive chronic kidney disease with stage 1 through stage 4 chronic kidney disease, or unspecified chronic kidney disease: Secondary | ICD-10-CM | POA: Diagnosis not present

## 2016-12-23 DIAGNOSIS — Z794 Long term (current) use of insulin: Secondary | ICD-10-CM

## 2016-12-23 DIAGNOSIS — N183 Chronic kidney disease, stage 3 (moderate): Secondary | ICD-10-CM

## 2016-12-23 LAB — POCT GLYCOSYLATED HEMOGLOBIN (HGB A1C): HEMOGLOBIN A1C: 5.9

## 2016-12-23 LAB — POCT UA - MICROALBUMIN: Microalbumin Ur, POC: 100 mg/L

## 2016-12-23 MED ORDER — NYSTATIN 100000 UNIT/GM EX POWD: 1.0000 | Freq: Three times a day (TID) | CUTANEOUS | 0 refills | Status: DC

## 2016-12-23 NOTE — Patient Instructions (Addendum)
Decrease Nighttime unites by 2 units a day until morning blood sugars are between 100-140.  Please check blood sugars 2 hours after meals a few times a week and record this for your follow up visit.

## 2016-12-23 NOTE — Progress Notes (Signed)
Name: Veronica Anderson   MRN: [id number]    DOB: January 21, 1946   Date:12/23/2016       Progress Note  Subjective  Chief Complaint  Chief Complaint  Patient presents with  . Diabetes    4 month follow up  . Sleep Apnea  . Hypertension  . Chronic Kidney Disease    HPI  DMII: She has lost 8lbs since last visit. A1C in 08/2016 was 6.4%, Today it is 5.9%.  Uses 35Units Glargine QHS and 5Units before meals. BS's are running lower than goal (Lowest 61 and usually running below 100). She notes some shakiness, and sleeps a lot throughout the day - discussed risk of hypoglycemia and how to deal with this. She went for an eye exam and had surgery, seeing better. she states neuropathy is under control with Gabapentin - no pain, but still had decrease in sensation of both feet.  she also has CKI and is not on ACE because of history of allergy.   HTN: she has been taking medication as prescribed, no chest pain or palpitation. Complaint with medication and denies side effects.   Hyperlipidemia: taking medication and denies side effects, including no myalgia  CKI: she was seen by nephrologist, and was advised to continue follow up, son has not made her a follow up appointment yet.   OSA: uses CPAP every night, all night and is having supplemental oxygen through the CPAP machine. She has a rash on her face from the mask. Elidel worked for facial rashNo gasping for air or orthopnea.  Needs new machine.  CHF: Was hospitalized diastolic, she still has orthopnea, uses 2 pillows at night, using CPAP, no leg swelling, states she has SOB with mid activity. On beta-blocker, hydralazine - higher dose since hospital stay, not on ACE/ARB - she is not sure if she had angioedema, but afraid to try it.  GERD: under control, she is on Ranitidine, off omeprazole.  Patient Active Problem List   Diagnosis Date Noted  . CHF (congestive heart failure) ([place]) 11/07/2016  . Nocturnal oxygen desaturation 06/26/2015  .  Anemia in chronic illness 02/23/2015  . Asthma, mild intermittent 02/23/2015  . Benign essential HTN 02/23/2015  . Diastolic heart failure, NYHA class 3 ([place]) 02/23/2015  . Benign hypertension with chronic kidney disease, stage III 02/23/2015  . CN (constipation) 02/23/2015  . Diabetes mellitus with neuropathy ([place]) 02/23/2015  . Dyslipidemia 02/23/2015  . Elevated ferritin 02/23/2015  . Gastro-esophageal reflux disease without esophagitis 02/23/2015  . LBP (low back pain) 02/23/2015  . Extreme obesity 02/23/2015  . Obstructive apnea 02/23/2015  . Osteoarthritis 02/23/2015  . Neuropathy 02/23/2015  . Perennial allergic rhinitis with seasonal variation 02/23/2015  . History of pneumonia 02/23/2015  . Vitamin D deficiency 02/23/2015  . Cataract 04/16/2010  . Background diabetic retinopathy ([place]) 12/18/2009    Past Surgical History:  Procedure Laterality Date  . ANKLE SURGERY Left   . CATARACT EXTRACTION W/PHACO Right 12/20/2015   Procedure: CATARACT EXTRACTION PHACO AND INTRAOCULAR LENS PLACEMENT (IOC);  Surgeon: Eulogio Bear, MD;  Location: [facility] ORS;  Service: Ophthalmology;  Laterality: Right;  [place] 2.50 AP% 21.8 CDE 37.04 Fluid pack lot # [id number]  . CATARACT EXTRACTION W/PHACO Left 02/14/2016   Procedure: CATARACT EXTRACTION PHACO AND INTRAOCULAR LENS PLACEMENT (IOC);  Surgeon: Eulogio Bear, MD;  Location: [facility] ORS;  Service: Ophthalmology;  Laterality: Left;  [place] 3.12 AP% 42.1 CDE 47.12 Fluid Pack lot # [id number]  . COLONOSCOPY    . FRACTURE SURGERY  Left    ankle    Family History  Problem Relation Age of Onset  . Diabetes type II Unknown     Social History   Social History  . Marital status: Single    Spouse name: N/A  . Number of children: N/A  . Years of education: N/A   Occupational History  . Not on file.   Social History Main Topics  . Smoking status: Former Smoker    Types: Cigarettes    Quit date: 07/14/1994  . Smokeless tobacco: Never Used  .  Alcohol use No  . Drug use: No  . Sexual activity: Not Currently   Other Topics Concern  . Not on file   Social History Narrative  . No narrative on file     Current Outpatient Prescriptions:  .  ACCU-CHEK AVIVA PLUS test strip, CHECK BLOOD SUGAR TWICE DAILY, Disp: 200 each, Rfl: 3 .  amLODipine (NORVASC) 10 MG tablet, Take 1 tablet (10 mg total) by mouth daily., Disp: 90 tablet, Rfl: 1 .  aspirin EC 81 MG tablet, Take 81 mg by mouth at bedtime., Disp: , Rfl:  .  cloNIDine (CATAPRES-TTS-3) 0.3 mg/24hr patch, Place 1 patch (0.3 mg total) onto the skin once a week. Pt applies patch on Sunday., Disp: 12 patch, Rfl: 2 .  desonide (DESOWEN) 0.05 % ointment, Apply 1 application topically 2 (two) times daily., Disp: 60 g, Rfl: 0 .  fluticasone (FLONASE) 50 MCG/ACT nasal spray, USE 2 SPRAYS IN EACH NOSTRIL EVERY NIGHT AT BEDTIME (Patient not taking: Reported on 11/07/2016), Disp: 16 g, Rfl: 2 .  furosemide (LASIX) 20 MG tablet, Take 1 tablet (20 mg total) by mouth 2 (two) times daily., Disp: 180 tablet, Rfl: 1 .  gabapentin (NEURONTIN) 300 MG capsule, TAKE 1 CAPSULE (300 MG TOTAL) BY MOUTH 3 (THREE) TIMES DAILY., Disp: 270 capsule, Rfl: 2 .  hydrALAZINE (APRESOLINE) 50 MG tablet, Take 1 tablet (50 mg total) by mouth 4 (four) times daily., Disp: 90 tablet, Rfl: 4 .  Insulin Glargine (LANTUS SOLOSTAR) 100 UNIT/ML Solostar Pen, INJECT 50 UNITS SUBCUTANEOUSLY DAILY, Disp: 15 pen, Rfl: 1 .  insulin lispro (HUMALOG KWIKPEN) 100 UNIT/ML KiwkPen, INJECT 5 TO 10 UNITS SUBCUTANEOUSLY BEFORE MEALS (Patient taking differently: Inject 5 Units into the skin 3 (three) times daily. ), Disp: 6 mL, Rfl: 5 .  Insulin Pen Needle 32G X 6 MM MISC, 1 each by Does not apply route 4 (four) times daily., Disp: 120 each, Rfl: 5 .  loratadine (CLARITIN) 10 MG tablet, TAKE 1 TABLET BY MOUTH EVERY DAY FOR ALLERGIES, Disp: 30 tablet, Rfl: 5 .  metoprolol (LOPRESSOR) 100 MG tablet, Take 1 tablet (100 mg total) by mouth 2 (two)  times daily., Disp: 180 tablet, Rfl: 2 .  montelukast (SINGULAIR) 10 MG tablet, Take 1 tablet (10 mg total) by mouth daily., Disp: 90 tablet, Rfl: 2 .  nystatin (MYCOSTATIN/NYSTOP) 100000 UNIT/GM POWD, Apply 1 Bottle topically 3 (three) times daily. (Patient not taking: Reported on 11/07/2016), Disp: 1 Bottle, Rfl: 0 .  omeprazole (PRILOSEC) 40 MG capsule, Take 1 capsule (40 mg total) by mouth daily. (Patient not taking: Reported on 11/07/2016), Disp: 90 capsule, Rfl: 2 .  pravastatin (PRAVACHOL) 40 MG tablet, TAKE 1 TABLET BY MOUTH ONCE A DAY FOR CHOLESTEROL, Disp: 90 tablet, Rfl: 2 .  ranitidine (ZANTAC) 150 MG tablet, Take 1 tablet (150 mg total) by mouth 2 (two) times daily. Trying to wean off Omeprazole, Disp: 180 tablet, Rfl: 1 .  Vitamin D, Ergocalciferol, (DRISDOL) 50000 units CAPS capsule, TAKE ONE CAPSULE BY MOUTH ONCE A WEEK, Disp: 4 capsule, Rfl: 1  Allergies  Allergen Reactions  . Ace Inhibitors Cough     ROS  Constitutional: Negative for fever, positive for  weight change.  Respiratory: Negative for cough but has  shortness of breath with mild activity.   Cardiovascular: Negative for chest pain or palpitations.  Gastrointestinal: Negative for abdominal pain, no bowel changes.  Musculoskeletal: Positive for gait problem but no  joint swelling.  Skin: Negative for rash - improved on her face.  Neurological: Negative for dizziness or headache.  No other specific complaints in a complete review of systems (except as listed in HPI above).  Objective  Vitals:   12/23/16 1055  BP: 132/68  Pulse: 74  Resp: 16  Temp: 97.4 F (36.3 C)  SpO2: 94%  Weight: (!) 304 lb (137.9 kg)  Height: 5' 4"  (1.626 m)    Body mass index is 52.18 kg/m.  Physical Exam  Constitutional: Patient appears well-developed Obese No distress.  HEENT: head atraumatic, normocephalic, pupils equal and reactive to light,neck supple, throat within normal limits Cardiovascular: Normal rate, regular  rhythm and normal heart sounds. 1/6 SEM, previously not heard ( Echo during hospital stay showed mild TR) . Negative for  BLE edema. Pulmonary/Chest: Effort normal and breath sounds normal. No respiratory distress. Abdominal: Soft. There is no tenderness. Psychiatric: Patient has a normal mood and affect. behavior is normal. Judgment and thought content normal. Skin: improvement of rash around mouth ( from CPAP mask)   Recent Results (from the past 2160 hour(s))  Troponin I     Status: Abnormal   Collection Time: 11/07/16  7:13 AM  Result Value Ref Range   Troponin I 0.03 (HH) <0.03 ng/mL    Comment: CRITICAL RESULT CALLED TO, READ BACK BY AND VERIFIED WITH BILL SMITH @ [phone] 11/07/16 BY [facility]   CBC with Differential     Status: Abnormal   Collection Time: 11/07/16  7:13 AM  Result Value Ref Range   WBC 11.6 (H) 3.6 - 11.0 K/uL   RBC 3.67 (L) 3.80 - 5.20 MIL/uL   Hemoglobin 8.3 (L) 12.0 - 16.0 g/dL   HCT 26.3 (L) 35.0 - 47.0 %   MCV 71.7 (L) 80.0 - 100.0 fL   MCH 22.5 (L) 26.0 - 34.0 pg   MCHC 31.4 (L) 32.0 - 36.0 g/dL   RDW 18.4 (H) 11.5 - 14.5 %   Platelets 260 150 - 440 K/uL   Neutrophils Relative % 87 %   Neutro Abs 10.1 (H) 1.4 - 6.5 K/uL   Lymphocytes Relative 6 %   Lymphs Abs 0.7 (L) 1.0 - 3.6 K/uL   Monocytes Relative 7 %   Monocytes Absolute 0.8 0.2 - 0.9 K/uL   Eosinophils Relative 0 %   Eosinophils Absolute 0.0 0 - 0.7 K/uL   Basophils Relative 0 %   Basophils Absolute 0.0 0 - 0.1 K/uL  Basic metabolic panel     Status: Abnormal   Collection Time: 11/07/16  7:13 AM  Result Value Ref Range   Sodium 143 135 - 145 mmol/L   Potassium 4.5 3.5 - 5.1 mmol/L   Chloride 102 101 - 111 mmol/L   CO2 29 22 - 32 mmol/L   Glucose, Bld 176 (H) 65 - 99 mg/dL   BUN 45 (H) 6 - 20 mg/dL   Creatinine, Ser 2.20 (H) 0.44 - 1.00 mg/dL   Calcium 8.7 (L)  8.9 - 10.3 mg/dL   GFR calc non Af Amer 21 (L) >60 mL/min   GFR calc Af Amer 25 (L) >60 mL/min    Comment: (NOTE) The eGFR has been  calculated using the CKD EPI equation. This calculation has not been validated in all clinical situations. eGFR's persistently <60 mL/min signify possible Chronic Kidney Disease.    Anion gap 12 5 - 15  Brain natriuretic peptide     Status: Abnormal   Collection Time: 11/07/16  7:14 AM  Result Value Ref Range   B Natriuretic Peptide 722.0 (H) 0.0 - 100.0 pg/mL  Glucose, capillary     Status: Abnormal   Collection Time: 11/07/16 11:56 AM  Result Value Ref Range   Glucose-Capillary 140 (H) 65 - 99 mg/dL  Vitamin B12     Status: None   Collection Time: 11/07/16  1:29 PM  Result Value Ref Range   Vitamin B-12 439 180 - 914 pg/mL    Comment: (NOTE) This assay is not validated for testing neonatal or myeloproliferative syndrome specimens for Vitamin B12 levels. Performed at [facility], [place] [address] [place], [place] [address]   Folate     Status: None   Collection Time: 11/07/16  1:29 PM  Result Value Ref Range   Folate 10.5 >5.9 ng/mL  Iron and TIBC     Status: Abnormal   Collection Time: 11/07/16  1:29 PM  Result Value Ref Range   Iron 17 (L) 28 - 170 ug/dL   TIBC 269 250 - 450 ug/dL   Saturation Ratios 6 (L) 10.4 - 31.8 %   UIBC 252 ug/dL  Ferritin     Status: Abnormal   Collection Time: 11/07/16  1:29 PM  Result Value Ref Range   Ferritin 331 (H) 11 - 307 ng/mL  Reticulocytes     Status: Abnormal   Collection Time: 11/07/16  1:29 PM  Result Value Ref Range   Retic Ct Pct 2.6 0.4 - 3.1 %   RBC. 3.65 (L) 3.80 - 5.20 MIL/uL   Retic Count, Manual 94.9 19.0 - 183.0 K/uL  Glucose, capillary     Status: None   Collection Time: 11/07/16  4:48 PM  Result Value Ref Range   Glucose-Capillary 76 65 - 99 mg/dL  ECHOCARDIOGRAM COMPLETE     Status: None   Collection Time: 11/07/16  8:42 PM  Result Value Ref Range   Weight 4,992 oz   Height 64 in   BP 166/60 mmHg  Glucose, capillary     Status: Abnormal   Collection Time: 11/07/16  9:23 PM  Result Value Ref Range    Glucose-Capillary 118 (H) 65 - 99 mg/dL  Basic metabolic panel     Status: Abnormal   Collection Time: 11/08/16  6:02 AM  Result Value Ref Range   Sodium 144 135 - 145 mmol/L   Potassium 3.9 3.5 - 5.1 mmol/L   Chloride 103 101 - 111 mmol/L   CO2 34 (H) 22 - 32 mmol/L   Glucose, Bld 102 (H) 65 - 99 mg/dL   BUN 45 (H) 6 - 20 mg/dL   Creatinine, Ser 1.93 (H) 0.44 - 1.00 mg/dL   Calcium 8.7 (L) 8.9 - 10.3 mg/dL   GFR calc non Af Amer 25 (L) >60 mL/min   GFR calc Af Amer 29 (L) >60 mL/min    Comment: (NOTE) The eGFR has been calculated using the CKD EPI equation. This calculation has not been validated in all clinical situations.  eGFR's persistently <60 mL/min signify possible Chronic Kidney Disease.    Anion gap 7 5 - 15  CBC     Status: Abnormal   Collection Time: 11/08/16  6:02 AM  Result Value Ref Range   WBC 9.8 3.6 - 11.0 K/uL   RBC 3.41 (L) 3.80 - 5.20 MIL/uL   Hemoglobin 7.4 (L) 12.0 - 16.0 g/dL   HCT 24.0 (L) 35.0 - 47.0 %   MCV 70.5 (L) 80.0 - 100.0 fL   MCH 21.8 (L) 26.0 - 34.0 pg   MCHC 31.0 (L) 32.0 - 36.0 g/dL   RDW 18.5 (H) 11.5 - 14.5 %   Platelets 257 150 - 440 K/uL  Glucose, capillary     Status: Abnormal   Collection Time: 11/08/16  7:32 AM  Result Value Ref Range   Glucose-Capillary 105 (H) 65 - 99 mg/dL  Glucose, capillary     Status: None   Collection Time: 11/08/16 11:34 AM  Result Value Ref Range   Glucose-Capillary 97 65 - 99 mg/dL  Glucose, capillary     Status: Abnormal   Collection Time: 11/08/16  4:41 PM  Result Value Ref Range   Glucose-Capillary 129 (H) 65 - 99 mg/dL  Glucose, capillary     Status: Abnormal   Collection Time: 11/08/16  8:36 PM  Result Value Ref Range   Glucose-Capillary 128 (H) 65 - 99 mg/dL  Basic metabolic panel     Status: Abnormal   Collection Time: 11/09/16  7:17 AM  Result Value Ref Range   Sodium 148 (H) 135 - 145 mmol/L   Potassium 3.7 3.5 - 5.1 mmol/L   Chloride 100 (L) 101 - 111 mmol/L   CO2 37 (H) 22 - 32  mmol/L   Glucose, Bld 144 (H) 65 - 99 mg/dL   BUN 40 (H) 6 - 20 mg/dL   Creatinine, Ser 1.88 (H) 0.44 - 1.00 mg/dL   Calcium 9.0 8.9 - 10.3 mg/dL   GFR calc non Af Amer 26 (L) >60 mL/min   GFR calc Af Amer 30 (L) >60 mL/min    Comment: (NOTE) The eGFR has been calculated using the CKD EPI equation. This calculation has not been validated in all clinical situations. eGFR's persistently <60 mL/min signify possible Chronic Kidney Disease.    Anion gap 11 5 - 15  Glucose, capillary     Status: Abnormal   Collection Time: 11/09/16  7:20 AM  Result Value Ref Range   Glucose-Capillary 144 (H) 65 - 99 mg/dL  Hemoglobin     Status: Abnormal   Collection Time: 11/09/16  7:29 AM  Result Value Ref Range   Hemoglobin 8.1 (L) 12.0 - 16.0 g/dL  Glucose, capillary     Status: Abnormal   Collection Time: 11/09/16 11:31 AM  Result Value Ref Range   Glucose-Capillary 145 (H) 65 - 99 mg/dL  Glucose, capillary     Status: Abnormal   Collection Time: 11/09/16  5:14 PM  Result Value Ref Range   Glucose-Capillary 176 (H) 65 - 99 mg/dL  Glucose, capillary     Status: Abnormal   Collection Time: 11/09/16  9:21 PM  Result Value Ref Range   Glucose-Capillary 143 (H) 65 - 99 mg/dL  Basic metabolic panel     Status: Abnormal   Collection Time: 11/10/16  7:52 AM  Result Value Ref Range   Sodium 145 135 - 145 mmol/L   Potassium 3.5 3.5 - 5.1 mmol/L   Chloride 98 (L) 101 - 111  mmol/L   CO2 39 (H) 22 - 32 mmol/L   Glucose, Bld 96 65 - 99 mg/dL   BUN 35 (H) 6 - 20 mg/dL   Creatinine, Ser 1.97 (H) 0.44 - 1.00 mg/dL   Calcium 8.9 8.9 - 10.3 mg/dL   GFR calc non Af Amer 25 (L) >60 mL/min   GFR calc Af Amer 28 (L) >60 mL/min    Comment: (NOTE) The eGFR has been calculated using the CKD EPI equation. This calculation has not been validated in all clinical situations. eGFR's persistently <60 mL/min signify possible Chronic Kidney Disease.    Anion gap 8 5 - 15  Glucose, capillary     Status: Abnormal    Collection Time: 11/10/16  8:01 AM  Result Value Ref Range   Glucose-Capillary 112 (H) 65 - 99 mg/dL  Glucose, capillary     Status: Abnormal   Collection Time: 11/10/16 11:58 AM  Result Value Ref Range   Glucose-Capillary 113 (H) 65 - 99 mg/dL  POCT HgB A1C     Status: Normal   Collection Time: 12/23/16 11:03 AM  Result Value Ref Range   Hemoglobin A1C 5.9   POCT UA - Microalbumin     Status: Abnormal   Collection Time: 12/23/16 11:04 AM  Result Value Ref Range   Microalbumin Ur, POC 100 mg/L   Creatinine, POC  mg/dL   Albumin/Creatinine Ratio, Urine, POC       PHQ2/9: Depression screen [facility] 2/9 04/15/2016 11/14/2015 06/26/2015 02/26/2015  Decreased Interest 0 0 0 0  Down, Depressed, Hopeless 0 0 0 0  PHQ - 2 Score 0 0 0 0     Fall Risk: Fall Risk  04/15/2016 11/14/2015 06/26/2015 02/26/2015  Falls in the past year? No No No No     Assessment & Plan  1. Type 2 diabetes mellitus with diabetic neuropathy, with long-term current use of insulin (HCC)  We will titrate down her insulin, since fasting levels has been low and also hgbA1C - POCT HgB A1C - POCT UA - Microalbumin  2. Prolonged Q-T interval on ECG  During hospital stay  3. Benign hypertension with CKD (chronic kidney disease) stage III  At goal  4. Obstructive apnea  Continue CPAP   5. Eczema of face  Improved with Elidel  6. Dyslipidemia  Continue statin therapy   7. Diastolic heart failure, NYHA class 3 (HCC)  Stable, needs to follow up with Dr. Clayborn Bigness  8. Chronic kidney disease (CKD), stage IV (severe) (HCC)  Explained importance of her following up with nephrologist, her son must make an appointment

## 2016-12-24 MED ORDER — NYSTATIN 100000 UNIT/GM EX POWD: 1.0000 g | Freq: Three times a day (TID) | CUTANEOUS | 1 refills | Status: DC

## 2016-12-24 NOTE — Addendum Note (Signed)
Addended by: Hubbard Hartshorn on: 12/24/2016 08:18 AM   Modules accepted: Orders

## 2017-01-08 DIAGNOSIS — I509 Heart failure, unspecified: Secondary | ICD-10-CM | POA: Diagnosis not present

## 2017-01-23 ENCOUNTER — Other Ambulatory Visit: Payer: Self-pay | Admitting: Family Medicine

## 2017-01-23 DIAGNOSIS — E559 Vitamin D deficiency, unspecified: Secondary | ICD-10-CM

## 2017-01-23 NOTE — Telephone Encounter (Signed)
[facility] 

## 2017-02-07 DIAGNOSIS — I509 Heart failure, unspecified: Secondary | ICD-10-CM | POA: Diagnosis not present

## 2017-02-13 ENCOUNTER — Other Ambulatory Visit: Payer: Self-pay | Admitting: Family Medicine

## 2017-02-13 DIAGNOSIS — N183 Chronic kidney disease, stage 3 unspecified: Secondary | ICD-10-CM

## 2017-02-13 DIAGNOSIS — I129 Hypertensive chronic kidney disease with stage 1 through stage 4 chronic kidney disease, or unspecified chronic kidney disease: Secondary | ICD-10-CM

## 2017-02-13 DIAGNOSIS — K219 Gastro-esophageal reflux disease without esophagitis: Secondary | ICD-10-CM

## 2017-02-18 ENCOUNTER — Other Ambulatory Visit: Payer: Self-pay | Admitting: Family Medicine

## 2017-02-18 DIAGNOSIS — I5030 Unspecified diastolic (congestive) heart failure: Secondary | ICD-10-CM

## 2017-02-18 DIAGNOSIS — E1122 Type 2 diabetes mellitus with diabetic chronic kidney disease: Secondary | ICD-10-CM

## 2017-02-18 DIAGNOSIS — Z794 Long term (current) use of insulin: Principal | ICD-10-CM

## 2017-02-18 DIAGNOSIS — E1140 Type 2 diabetes mellitus with diabetic neuropathy, unspecified: Secondary | ICD-10-CM

## 2017-02-18 DIAGNOSIS — N184 Chronic kidney disease, stage 4 (severe): Secondary | ICD-10-CM

## 2017-02-18 DIAGNOSIS — E1144 Type 2 diabetes mellitus with diabetic amyotrophy: Secondary | ICD-10-CM

## 2017-02-19 NOTE — Telephone Encounter (Signed)
Patient requesting refill of Novofine needles to [facility].

## 2017-02-26 ENCOUNTER — Other Ambulatory Visit: Payer: Self-pay | Admitting: Family Medicine

## 2017-02-26 NOTE — Telephone Encounter (Signed)
Please contact patient to make sure she is taking Hydralazine. It was discontinued back in April by hospital physician

## 2017-02-26 NOTE — Telephone Encounter (Signed)
Patient states she doesn't believe she is on this medication any longer and probably was a automatically refill request from the pharmacy. But would recheck with her son once he got home.

## 2017-02-27 ENCOUNTER — Other Ambulatory Visit: Payer: Self-pay | Admitting: Family Medicine

## 2017-02-27 DIAGNOSIS — E559 Vitamin D deficiency, unspecified: Secondary | ICD-10-CM

## 2017-03-10 DIAGNOSIS — I509 Heart failure, unspecified: Secondary | ICD-10-CM | POA: Diagnosis not present

## 2017-03-31 ENCOUNTER — Other Ambulatory Visit: Payer: Self-pay | Admitting: Family Medicine

## 2017-03-31 DIAGNOSIS — I10 Essential (primary) hypertension: Secondary | ICD-10-CM

## 2017-03-31 NOTE — Telephone Encounter (Signed)
Patient requesting refill of Clonidine and Hydralazine to [facility].

## 2017-04-10 DIAGNOSIS — I509 Heart failure, unspecified: Secondary | ICD-10-CM | POA: Diagnosis not present

## 2017-04-16 ENCOUNTER — Telehealth: Payer: Self-pay | Admitting: Emergency Medicine

## 2017-04-16 NOTE — Telephone Encounter (Signed)
Patient son called and stated he notice him mothers BP going up and her hydralazine was stopped. Would like for her to go back on medication before BP get to elevated. Spoke to [place] NP and we offered patient appointment for tomorrow morning to discuss with Dr. Ancil Boozer. Patient son refused appointment and stated they have appointment for next week and discuss at that time.

## 2017-04-17 ENCOUNTER — Other Ambulatory Visit: Payer: Self-pay | Admitting: Family Medicine

## 2017-04-17 MED ORDER — HYDRALAZINE HCL 25 MG PO TABS: 25.0000 mg | ORAL_TABLET | Freq: Three times a day (TID) | ORAL | 0 refills | Status: DC

## 2017-04-17 NOTE — Telephone Encounter (Signed)
I sent lower dose of hydralazine until follow up next week 25 mg tid

## 2017-04-17 NOTE — Telephone Encounter (Signed)
Spoke to her son [name]

## 2017-04-19 ENCOUNTER — Inpatient Hospital Stay
Admission: EM | Admit: 2017-04-19 | Discharge: 2017-05-05 | DRG: 291 | Disposition: A | Discharge status: Home / Self Care | Payer: Medicare Other | Attending: Internal Medicine | Admitting: Internal Medicine

## 2017-04-19 ENCOUNTER — Emergency Department: Payer: Medicare Other

## 2017-04-19 DIAGNOSIS — N179 Acute kidney failure, unspecified: Secondary | ICD-10-CM | POA: Diagnosis not present

## 2017-04-19 DIAGNOSIS — J90 Pleural effusion, not elsewhere classified: Secondary | ICD-10-CM | POA: Diagnosis not present

## 2017-04-19 DIAGNOSIS — I252 Old myocardial infarction: Secondary | ICD-10-CM | POA: Diagnosis not present

## 2017-04-19 DIAGNOSIS — Z23 Encounter for immunization: Secondary | ICD-10-CM

## 2017-04-19 DIAGNOSIS — R778 Other specified abnormalities of plasma proteins: Secondary | ICD-10-CM

## 2017-04-19 DIAGNOSIS — D649 Anemia, unspecified: Secondary | ICD-10-CM | POA: Diagnosis not present

## 2017-04-19 DIAGNOSIS — G4733 Obstructive sleep apnea (adult) (pediatric): Secondary | ICD-10-CM | POA: Diagnosis not present

## 2017-04-19 DIAGNOSIS — I130 Hypertensive heart and chronic kidney disease with heart failure and stage 1 through stage 4 chronic kidney disease, or unspecified chronic kidney disease: Principal | ICD-10-CM | POA: Diagnosis present

## 2017-04-19 DIAGNOSIS — E1165 Type 2 diabetes mellitus with hyperglycemia: Secondary | ICD-10-CM | POA: Diagnosis present

## 2017-04-19 DIAGNOSIS — J9621 Acute and chronic respiratory failure with hypoxia: Secondary | ICD-10-CM | POA: Diagnosis not present

## 2017-04-19 DIAGNOSIS — J9622 Acute and chronic respiratory failure with hypercapnia: Secondary | ICD-10-CM

## 2017-04-19 DIAGNOSIS — J9602 Acute respiratory failure with hypercapnia: Secondary | ICD-10-CM

## 2017-04-19 DIAGNOSIS — J9811 Atelectasis: Secondary | ICD-10-CM | POA: Diagnosis not present

## 2017-04-19 DIAGNOSIS — I517 Cardiomegaly: Secondary | ICD-10-CM | POA: Diagnosis not present

## 2017-04-19 DIAGNOSIS — E870 Hyperosmolality and hypernatremia: Secondary | ICD-10-CM | POA: Diagnosis present

## 2017-04-19 DIAGNOSIS — J384 Edema of larynx: Secondary | ICD-10-CM | POA: Diagnosis not present

## 2017-04-19 DIAGNOSIS — E871 Hypo-osmolality and hyponatremia: Secondary | ICD-10-CM | POA: Diagnosis present

## 2017-04-19 DIAGNOSIS — Z794 Long term (current) use of insulin: Secondary | ICD-10-CM

## 2017-04-19 DIAGNOSIS — I10 Essential (primary) hypertension: Secondary | ICD-10-CM | POA: Diagnosis not present

## 2017-04-19 DIAGNOSIS — Z87891 Personal history of nicotine dependence: Secondary | ICD-10-CM

## 2017-04-19 DIAGNOSIS — I959 Hypotension, unspecified: Secondary | ICD-10-CM | POA: Diagnosis not present

## 2017-04-19 DIAGNOSIS — J189 Pneumonia, unspecified organism: Secondary | ICD-10-CM

## 2017-04-19 DIAGNOSIS — E1142 Type 2 diabetes mellitus with diabetic polyneuropathy: Secondary | ICD-10-CM | POA: Diagnosis not present

## 2017-04-19 DIAGNOSIS — R0602 Shortness of breath: Secondary | ICD-10-CM | POA: Diagnosis not present

## 2017-04-19 DIAGNOSIS — D631 Anemia in chronic kidney disease: Secondary | ICD-10-CM

## 2017-04-19 DIAGNOSIS — Z7982 Long term (current) use of aspirin: Secondary | ICD-10-CM | POA: Diagnosis not present

## 2017-04-19 DIAGNOSIS — N170 Acute kidney failure with tubular necrosis: Secondary | ICD-10-CM | POA: Diagnosis present

## 2017-04-19 DIAGNOSIS — I5033 Acute on chronic diastolic (congestive) heart failure: Secondary | ICD-10-CM | POA: Diagnosis not present

## 2017-04-19 DIAGNOSIS — E876 Hypokalemia: Secondary | ICD-10-CM

## 2017-04-19 DIAGNOSIS — J9601 Acute respiratory failure with hypoxia: Secondary | ICD-10-CM

## 2017-04-19 DIAGNOSIS — D509 Iron deficiency anemia, unspecified: Secondary | ICD-10-CM | POA: Diagnosis not present

## 2017-04-19 DIAGNOSIS — K219 Gastro-esophageal reflux disease without esophagitis: Secondary | ICD-10-CM | POA: Diagnosis present

## 2017-04-19 DIAGNOSIS — Z8673 Personal history of transient ischemic attack (TIA), and cerebral infarction without residual deficits: Secondary | ICD-10-CM

## 2017-04-19 DIAGNOSIS — E8809 Other disorders of plasma-protein metabolism, not elsewhere classified: Secondary | ICD-10-CM | POA: Diagnosis not present

## 2017-04-19 DIAGNOSIS — E1140 Type 2 diabetes mellitus with diabetic neuropathy, unspecified: Secondary | ICD-10-CM

## 2017-04-19 DIAGNOSIS — I110 Hypertensive heart disease with heart failure: Secondary | ICD-10-CM | POA: Diagnosis not present

## 2017-04-19 DIAGNOSIS — Z452 Encounter for adjustment and management of vascular access device: Secondary | ICD-10-CM

## 2017-04-19 DIAGNOSIS — E1122 Type 2 diabetes mellitus with diabetic chronic kidney disease: Secondary | ICD-10-CM | POA: Diagnosis present

## 2017-04-19 DIAGNOSIS — E11649 Type 2 diabetes mellitus with hypoglycemia without coma: Secondary | ICD-10-CM | POA: Diagnosis present

## 2017-04-19 DIAGNOSIS — E785 Hyperlipidemia, unspecified: Secondary | ICD-10-CM | POA: Diagnosis present

## 2017-04-19 DIAGNOSIS — I509 Heart failure, unspecified: Secondary | ICD-10-CM

## 2017-04-19 DIAGNOSIS — N189 Chronic kidney disease, unspecified: Secondary | ICD-10-CM | POA: Diagnosis not present

## 2017-04-19 DIAGNOSIS — T502X5A Adverse effect of carbonic-anhydrase inhibitors, benzothiadiazides and other diuretics, initial encounter: Secondary | ICD-10-CM | POA: Diagnosis present

## 2017-04-19 DIAGNOSIS — Z4659 Encounter for fitting and adjustment of other gastrointestinal appliance and device: Secondary | ICD-10-CM

## 2017-04-19 DIAGNOSIS — Z9981 Dependence on supplemental oxygen: Secondary | ICD-10-CM

## 2017-04-19 DIAGNOSIS — I5023 Acute on chronic systolic (congestive) heart failure: Secondary | ICD-10-CM | POA: Diagnosis not present

## 2017-04-19 DIAGNOSIS — R7989 Other specified abnormal findings of blood chemistry: Secondary | ICD-10-CM

## 2017-04-19 DIAGNOSIS — E662 Morbid (severe) obesity with alveolar hypoventilation: Secondary | ICD-10-CM | POA: Diagnosis present

## 2017-04-19 DIAGNOSIS — Z4682 Encounter for fitting and adjustment of non-vascular catheter: Secondary | ICD-10-CM | POA: Diagnosis not present

## 2017-04-19 DIAGNOSIS — I129 Hypertensive chronic kidney disease with stage 1 through stage 4 chronic kidney disease, or unspecified chronic kidney disease: Secondary | ICD-10-CM | POA: Diagnosis not present

## 2017-04-19 DIAGNOSIS — J9690 Respiratory failure, unspecified, unspecified whether with hypoxia or hypercapnia: Secondary | ICD-10-CM

## 2017-04-19 DIAGNOSIS — R918 Other nonspecific abnormal finding of lung field: Secondary | ICD-10-CM | POA: Diagnosis not present

## 2017-04-19 DIAGNOSIS — D62 Acute posthemorrhagic anemia: Secondary | ICD-10-CM | POA: Diagnosis not present

## 2017-04-19 DIAGNOSIS — G9340 Encephalopathy, unspecified: Secondary | ICD-10-CM | POA: Diagnosis present

## 2017-04-19 DIAGNOSIS — N183 Chronic kidney disease, stage 3 (moderate): Secondary | ICD-10-CM | POA: Diagnosis not present

## 2017-04-19 DIAGNOSIS — K5900 Constipation, unspecified: Secondary | ICD-10-CM

## 2017-04-19 DIAGNOSIS — I4581 Long QT syndrome: Secondary | ICD-10-CM | POA: Diagnosis not present

## 2017-04-19 DIAGNOSIS — J9620 Acute and chronic respiratory failure, unspecified whether with hypoxia or hypercapnia: Secondary | ICD-10-CM | POA: Diagnosis not present

## 2017-04-19 DIAGNOSIS — D500 Iron deficiency anemia secondary to blood loss (chronic): Secondary | ICD-10-CM | POA: Diagnosis present

## 2017-04-19 DIAGNOSIS — R809 Proteinuria, unspecified: Secondary | ICD-10-CM | POA: Diagnosis not present

## 2017-04-19 DIAGNOSIS — Z6841 Body Mass Index (BMI) 40.0 and over, adult: Secondary | ICD-10-CM

## 2017-04-19 DIAGNOSIS — J9600 Acute respiratory failure, unspecified whether with hypoxia or hypercapnia: Secondary | ICD-10-CM | POA: Diagnosis not present

## 2017-04-19 DIAGNOSIS — R061 Stridor: Secondary | ICD-10-CM | POA: Diagnosis not present

## 2017-04-19 DIAGNOSIS — N184 Chronic kidney disease, stage 4 (severe): Secondary | ICD-10-CM | POA: Diagnosis not present

## 2017-04-19 DIAGNOSIS — I5021 Acute systolic (congestive) heart failure: Secondary | ICD-10-CM | POA: Diagnosis not present

## 2017-04-19 LAB — CBC
HCT: 25.6 % — ABNORMAL LOW (ref 35.0–47.0)
Hemoglobin: 7.8 g/dL — ABNORMAL LOW (ref 12.0–16.0)
MCH: 21.7 pg — ABNORMAL LOW (ref 26.0–34.0)
MCHC: 30.6 g/dL — AB (ref 32.0–36.0)
MCV: 70.8 fL — ABNORMAL LOW (ref 80.0–100.0)
Platelets: 248 10*3/uL (ref 150–440)
RBC: 3.61 MIL/uL — ABNORMAL LOW (ref 3.80–5.20)
RDW: 19.5 % — AB (ref 11.5–14.5)
WBC: 10.5 10*3/uL (ref 3.6–11.0)

## 2017-04-19 LAB — CBC WITH DIFFERENTIAL/PLATELET
Basophils Absolute: 0.1 10*3/uL (ref 0–0.1)
Basophils Relative: 0 %
EOS PCT: 0 %
Eosinophils Absolute: 0 10*3/uL (ref 0–0.7)
HCT: 26 % — ABNORMAL LOW (ref 35.0–47.0)
HEMOGLOBIN: 7.9 g/dL — AB (ref 12.0–16.0)
LYMPHS ABS: 0.8 10*3/uL — AB (ref 1.0–3.6)
Lymphocytes Relative: 5 %
MCH: 21 pg — AB (ref 26.0–34.0)
MCHC: 30.3 g/dL — AB (ref 32.0–36.0)
MCV: 69.1 fL — ABNORMAL LOW (ref 80.0–100.0)
MONOS PCT: 4 %
Monocytes Absolute: 0.7 10*3/uL (ref 0.2–0.9)
NEUTROS PCT: 91 %
Neutro Abs: 14.6 10*3/uL — ABNORMAL HIGH (ref 1.4–6.5)
Platelets: 321 10*3/uL (ref 150–440)
RBC: 3.76 MIL/uL — ABNORMAL LOW (ref 3.80–5.20)
RDW: 19.2 % — ABNORMAL HIGH (ref 11.5–14.5)
WBC: 16.1 10*3/uL — ABNORMAL HIGH (ref 3.6–11.0)

## 2017-04-19 LAB — LIPASE, BLOOD: Lipase: 15 U/L (ref 11–51)

## 2017-04-19 LAB — TROPONIN I: Troponin I: 0.03 ng/mL (ref <0.03)

## 2017-04-19 LAB — GLUCOSE, CAPILLARY
GLUCOSE-CAPILLARY: 146 mg/dL — AB (ref 65–99)
GLUCOSE-CAPILLARY: 150 mg/dL — AB (ref 65–99)
GLUCOSE-CAPILLARY: 150 mg/dL — AB (ref 65–99)
GLUCOSE-CAPILLARY: 135 mg/dL — AB (ref 65–99)

## 2017-04-19 LAB — COMPREHENSIVE METABOLIC PANEL
ALK PHOS: 98 U/L (ref 38–126)
ALT: 9 U/L — AB (ref 14–54)
AST: 13 U/L — AB (ref 15–41)
Albumin: 3.1 g/dL — ABNORMAL LOW (ref 3.5–5.0)
Anion gap: 12 (ref 5–15)
BILIRUBIN TOTAL: 1 mg/dL (ref 0.3–1.2)
BUN: 24 mg/dL — AB (ref 6–20)
CALCIUM: 8.6 mg/dL — AB (ref 8.9–10.3)
CO2: 32 mmol/L (ref 22–32)
CREATININE: 1.86 mg/dL — AB (ref 0.44–1.00)
Chloride: 101 mmol/L (ref 101–111)
GFR calc Af Amer: 30 mL/min — ABNORMAL LOW (ref >60)
GFR calc non Af Amer: 26 mL/min — ABNORMAL LOW (ref >60)
Glucose, Bld: 205 mg/dL — ABNORMAL HIGH (ref 65–99)
Potassium: 3.1 mmol/L — ABNORMAL LOW (ref 3.5–5.1)
Sodium: 145 mmol/L (ref 135–145)
TOTAL PROTEIN: 7.9 g/dL (ref 6.5–8.1)

## 2017-04-19 LAB — BLOOD GAS, ARTERIAL
ACID-BASE EXCESS: 11.7 mmol/L — AB (ref 0.0–2.0)
Acid-Base Excess: 10.2 mmol/L — ABNORMAL HIGH (ref 0.0–2.0)
BICARBONATE: 38.5 mmol/L — AB (ref 20.0–28.0)
Bicarbonate: 41 mmol/L — ABNORMAL HIGH (ref 20.0–28.0)
Expiratory PAP: 6
FIO2: 1
FIO2: 0.35
Inspiratory PAP: 18
MODE: POSITIVE
Mechanical Rate: 12
O2 SAT: 93 %
O2 Saturation: 99.6 %
PATIENT TEMPERATURE: 37
PCO2 ART: 82 mmHg — AB (ref 32.0–48.0)
PO2 ART: 202 mmHg — AB (ref 83.0–108.0)
Patient temperature: 37
pCO2 arterial: 76 mmHg (ref 32.0–48.0)
pH, Arterial: 7.28 — ABNORMAL LOW (ref 7.350–7.450)
pH, Arterial: 7.34 — ABNORMAL LOW (ref 7.350–7.450)
pO2, Arterial: 71 mmHg — ABNORMAL LOW (ref 83.0–108.0)

## 2017-04-19 LAB — BASIC METABOLIC PANEL
Anion gap: 12 (ref 5–15)
BUN: 23 mg/dL — AB (ref 6–20)
CALCIUM: 8.5 mg/dL — AB (ref 8.9–10.3)
CO2: 35 mmol/L — ABNORMAL HIGH (ref 22–32)
CREATININE: 1.74 mg/dL — AB (ref 0.44–1.00)
Chloride: 101 mmol/L (ref 101–111)
GFR calc non Af Amer: 28 mL/min — ABNORMAL LOW (ref >60)
GFR, EST AFRICAN AMERICAN: 33 mL/min — AB (ref >60)
Glucose, Bld: 158 mg/dL — ABNORMAL HIGH (ref 65–99)
Potassium: 3.3 mmol/L — ABNORMAL LOW (ref 3.5–5.1)
SODIUM: 148 mmol/L — AB (ref 135–145)

## 2017-04-19 LAB — PROTIME-INR
INR: 1.08
PROTHROMBIN TIME: 13.9 s (ref 11.4–15.2)

## 2017-04-19 LAB — MRSA PCR SCREENING: MRSA by PCR: POSITIVE — AB

## 2017-04-19 LAB — PHOSPHORUS: Phosphorus: 3.9 mg/dL (ref 2.5–4.6)

## 2017-04-19 LAB — MAGNESIUM: Magnesium: 1.9 mg/dL (ref 1.7–2.4)

## 2017-04-19 LAB — BRAIN NATRIURETIC PEPTIDE: B Natriuretic Peptide: 370 pg/mL — ABNORMAL HIGH (ref 0.0–100.0)

## 2017-04-19 MED ORDER — POTASSIUM CHLORIDE 10 MEQ/100ML IV SOLN
10.0000 meq | INTRAVENOUS | Status: AC
  Administered 2017-04-19 – 2017-04-20 (×4): 10 meq via INTRAVENOUS
  Filled 2017-04-19 (×4): qty 100

## 2017-04-19 MED ORDER — ACETAMINOPHEN 325 MG PO TABS
650.0000 mg | ORAL_TABLET | Freq: Four times a day (QID) | ORAL | Status: DC | PRN
  Administered 2017-04-21 – 2017-04-26 (×2): 650 mg via ORAL
  Filled 2017-04-19: qty 2

## 2017-04-19 MED ORDER — ORAL CARE MOUTH RINSE
15.0000 mL | Freq: Two times a day (BID) | OROMUCOSAL | Status: DC
  Administered 2017-04-19 – 2017-04-24 (×7): 15 mL via OROMUCOSAL

## 2017-04-19 MED ORDER — INSULIN ASPART 100 UNIT/ML ~~LOC~~ SOLN
0.0000 [IU] | Freq: Every day | SUBCUTANEOUS | Status: DC
  Administered 2017-04-20: 0 [IU] via SUBCUTANEOUS

## 2017-04-19 MED ORDER — CLONIDINE HCL 0.1 MG/24HR TD PTWK: 0.1000 mg | MEDICATED_PATCH | TRANSDERMAL | Status: DC

## 2017-04-19 MED ORDER — GABAPENTIN 300 MG PO CAPS
300.0000 mg | ORAL_CAPSULE | Freq: Three times a day (TID) | ORAL | Status: DC
  Administered 2017-04-19 – 2017-04-30 (×29): 300 mg via ORAL
  Filled 2017-04-19 (×29): qty 1

## 2017-04-19 MED ORDER — METOPROLOL TARTRATE 50 MG PO TABS
100.0000 mg | ORAL_TABLET | Freq: Two times a day (BID) | ORAL | Status: DC
  Administered 2017-04-19 – 2017-04-24 (×10): 100 mg via ORAL
  Filled 2017-04-19 (×10): qty 2

## 2017-04-19 MED ORDER — ACETAMINOPHEN 650 MG RE SUPP: 650.0000 mg | Freq: Four times a day (QID) | RECTAL | Status: DC | PRN

## 2017-04-19 MED ORDER — MONTELUKAST SODIUM 10 MG PO TABS
10.0000 mg | ORAL_TABLET | Freq: Every day | ORAL | Status: DC
  Administered 2017-04-19 – 2017-04-26 (×8): 10 mg via ORAL
  Filled 2017-04-19 (×8): qty 1

## 2017-04-19 MED ORDER — SODIUM CHLORIDE 0.9% FLUSH
3.0000 mL | Freq: Two times a day (BID) | INTRAVENOUS | Status: DC
  Administered 2017-04-19 – 2017-05-05 (×30): 3 mL via INTRAVENOUS

## 2017-04-19 MED ORDER — ORAL CARE MOUTH RINSE
15.0000 mL | Freq: Two times a day (BID) | OROMUCOSAL | Status: DC
  Administered 2017-04-20 – 2017-04-23 (×7): 15 mL via OROMUCOSAL

## 2017-04-19 MED ORDER — CHLORHEXIDINE GLUCONATE 0.12 % MT SOLN
15.0000 mL | Freq: Two times a day (BID) | OROMUCOSAL | Status: DC
  Administered 2017-04-19 – 2017-04-24 (×9): 15 mL via OROMUCOSAL
  Filled 2017-04-19 (×8): qty 15

## 2017-04-19 MED ORDER — PRAVASTATIN SODIUM 20 MG PO TABS
40.0000 mg | ORAL_TABLET | Freq: Every day | ORAL | Status: DC
  Administered 2017-04-21 – 2017-04-26 (×5): 40 mg via ORAL
  Filled 2017-04-19 (×5): qty 2

## 2017-04-19 MED ORDER — FUROSEMIDE 10 MG/ML IJ SOLN
60.0000 mg | Freq: Three times a day (TID) | INTRAMUSCULAR | Status: DC
  Administered 2017-04-19 – 2017-04-22 (×9): 60 mg via INTRAVENOUS
  Filled 2017-04-19 (×9): qty 6

## 2017-04-19 MED ORDER — INSULIN ASPART 100 UNIT/ML ~~LOC~~ SOLN
0.0000 [IU] | Freq: Three times a day (TID) | SUBCUTANEOUS | Status: DC
  Administered 2017-04-19 – 2017-04-24 (×7): 2 [IU] via SUBCUTANEOUS
  Administered 2017-04-25: 5 [IU] via SUBCUTANEOUS
  Filled 2017-04-19 (×8): qty 1

## 2017-04-19 MED ORDER — ENOXAPARIN SODIUM 40 MG/0.4ML ~~LOC~~ SOLN
40.0000 mg | Freq: Two times a day (BID) | SUBCUTANEOUS | Status: DC
  Administered 2017-04-19 – 2017-04-27 (×16): 40 mg via SUBCUTANEOUS
  Filled 2017-04-19 (×16): qty 0.4

## 2017-04-19 MED ORDER — POLYETHYLENE GLYCOL 3350 17 G PO PACK
17.0000 g | PACK | Freq: Every day | ORAL | Status: DC | PRN
  Administered 2017-04-26: 17 g via ORAL
  Filled 2017-04-19: qty 1

## 2017-04-19 MED ORDER — ENOXAPARIN SODIUM 40 MG/0.4ML ~~LOC~~ SOLN: 40.0000 mg | SUBCUTANEOUS | Status: DC

## 2017-04-19 MED ORDER — INSULIN GLARGINE 100 UNIT/ML ~~LOC~~ SOLN
30.0000 [IU] | Freq: Every day | SUBCUTANEOUS | Status: DC
  Administered 2017-04-19 – 2017-04-24 (×6): 30 [IU] via SUBCUTANEOUS
  Filled 2017-04-19 (×8): qty 0.3

## 2017-04-19 MED ORDER — MUPIROCIN 2 % EX OINT
1.0000 "application " | TOPICAL_OINTMENT | Freq: Two times a day (BID) | CUTANEOUS | Status: AC
  Administered 2017-04-19 – 2017-04-24 (×10): 1 via NASAL
  Filled 2017-04-19: qty 22

## 2017-04-19 MED ORDER — ASPIRIN EC 81 MG PO TBEC
81.0000 mg | DELAYED_RELEASE_TABLET | Freq: Every day | ORAL | Status: DC
  Administered 2017-04-19 – 2017-04-25 (×7): 81 mg via ORAL
  Filled 2017-04-19 (×7): qty 1

## 2017-04-19 MED ORDER — IPRATROPIUM-ALBUTEROL 0.5-2.5 (3) MG/3ML IN SOLN
3.0000 mL | Freq: Four times a day (QID) | RESPIRATORY_TRACT | Status: DC | PRN
  Administered 2017-04-19: 3 mL via RESPIRATORY_TRACT
  Filled 2017-04-19: qty 3

## 2017-04-19 MED ORDER — FUROSEMIDE 10 MG/ML IJ SOLN
40.0000 mg | Freq: Once | INTRAMUSCULAR | Status: AC
  Administered 2017-04-19: 40 mg via INTRAVENOUS
  Filled 2017-04-19: qty 4

## 2017-04-19 MED ORDER — CHLORHEXIDINE GLUCONATE CLOTH 2 % EX PADS
6.0000 | MEDICATED_PAD | Freq: Every day | CUTANEOUS | Status: AC
  Administered 2017-04-20 – 2017-04-22 (×3): 6 via TOPICAL

## 2017-04-19 NOTE — ED Provider Notes (Signed)
[facility] Emergency Department Provider Note  ____________________________________________   First MD Initiated Contact with Patient 04/19/17 [phone]     (approximate)  I have reviewed the triage vital signs and the nursing notes.   HISTORY  Chief Complaint Respiratory Distress  Level 5 caveat:  history/ROS limited by acute/critical illness  HPI Veronica Anderson is a 71 y.o. female with a history that includes morbid obesity and CHFwho presents by EMS in severe respiratory distress.  Reportedly she has been gradually becoming more short of breath over the last couple of days.  Exertion makes it much worse and nothing is making it better.  She uses a CPAP at night and uses oxygen chronically.  Tonight her breathing was severe and her son try to help her get into the car but she was not able to make it.  When EMS arrived she was satting 60% on 4 L of oxygen.  Fortunately they started her on CPAP and her oxygenation improved to 99% upon arrival in the emergency department.  Her work of breathing  Has improved although she still is using accessory muscles.  She denies chest pain, denies history of COPD, and denies any recent fever/chills.  Past Medical History:  Diagnosis Date  . Allergy   . Anemia   . Asthma   . Cataract    bilateral  . CHF (NYHA class II, ACC/AHA stage C) ([place])   . Chronic kidney disease    stage IV (severe), Dr. Aleene Davidson  . Constipation   . Diabetes mellitus without complication ([place])   . Edema    feet/ankles  . GERD (gastroesophageal reflux disease)   . Hyperlipidemia   . Hypertension   . Left ankle pain   . Lumbago   . Myocardial infarction ([place])   . Neuropathy   . Neuropathy   . OA (osteoarthritis)   . Obesity   . Obstructive sleep apnea syndrome    CPAP  . Paresthesia    Foot  . Protein calorie malnutrition ([place])   . Requires supplemental oxygen   . Stroke ([place])   . Vitamin D deficiency     Patient Active Problem List   Diagnosis Date Noted  . Prolonged Q-T interval on ECG 12/23/2016  . CHF (congestive heart failure) ([place]) 11/07/2016  . Nocturnal oxygen desaturation 06/26/2015  . Anemia in chronic illness 02/23/2015  . Asthma, mild intermittent 02/23/2015  . Benign essential HTN 02/23/2015  . Diastolic heart failure, NYHA class 3 ([place]) 02/23/2015  . Benign hypertension with chronic kidney disease, stage III ([place]) 02/23/2015  . CN (constipation) 02/23/2015  . Diabetes mellitus with neuropathy ([place]) 02/23/2015  . Dyslipidemia 02/23/2015  . Elevated ferritin 02/23/2015  . Gastro-esophageal reflux disease without esophagitis 02/23/2015  . LBP (low back pain) 02/23/2015  . Extreme obesity 02/23/2015  . Obstructive apnea 02/23/2015  . Osteoarthritis 02/23/2015  . Neuropathy 02/23/2015  . Perennial allergic rhinitis with seasonal variation 02/23/2015  . History of pneumonia 02/23/2015  . Vitamin D deficiency 02/23/2015  . Cataract 04/16/2010  . Background diabetic retinopathy ([place]) 12/18/2009    Past Surgical History:  Procedure Laterality Date  . ANKLE SURGERY Left   . CATARACT EXTRACTION W/PHACO Right 12/20/2015   Procedure: CATARACT EXTRACTION PHACO AND INTRAOCULAR LENS PLACEMENT (IOC);  Surgeon: Eulogio Bear, MD;  Location: [facility] ORS;  Service: Ophthalmology;  Laterality: Right;  [place] 2.50 AP% 21.8 CDE 37.04 Fluid pack lot # [id number]  . CATARACT EXTRACTION W/PHACO Left 02/14/2016   Procedure: CATARACT  EXTRACTION PHACO AND INTRAOCULAR LENS PLACEMENT (IOC);  Surgeon: Eulogio Bear, MD;  Location: [facility] ORS;  Service: Ophthalmology;  Laterality: Left;  [place] 3.12 AP% 42.1 CDE 47.12 Fluid Pack lot # [id number]  . COLONOSCOPY    . FRACTURE SURGERY Left    ankle    Prior to Admission medications   Medication Sig Start Date End Date Taking? Authorizing Provider  amLODipine (NORVASC) 10 MG tablet TAKE 1 TABLET (10 MG TOTAL) BY MOUTH DAILY. 02/13/17  Yes Steele Sizer, MD  aspirin EC 81 MG tablet Take  81 mg by mouth at bedtime.   Yes [provider]  cloNIDine (CATAPRES - DOSED IN MG/24 HR) 0.3 mg/24hr patch PLACE 1 PATCH (0.3 MG TOTAL) ONTO THE SKIN ONCE A WEEK. APPLY PATCH ON SUNDAY. 03/31/17  Yes Sowles, Drue Stager, MD  desonide (DESOWEN) 0.05 % ointment Apply 1 application topically 2 (two) times daily. 08/22/16  Yes Sowles, Drue Stager, MD  furosemide (LASIX) 20 MG tablet Take 1 tablet (20 mg total) by mouth 2 (two) times daily. 08/21/16  Yes Sowles, Drue Stager, MD  gabapentin (NEURONTIN) 300 MG capsule TAKE 1 CAPSULE (300 MG TOTAL) BY MOUTH 3 (THREE) TIMES DAILY. 11/26/16  Yes Sowles, Drue Stager, MD  hydrALAZINE (APRESOLINE) 25 MG tablet Take 1 tablet (25 mg total) by mouth 3 (three) times daily. 04/17/17  Yes Sowles, Drue Stager, MD  Insulin Glargine (LANTUS SOLOSTAR) 100 UNIT/ML Solostar Pen INJECT 50 UNITS SUBCUTANEOUSLY DAILY 11/12/16  Yes Sowles, Drue Stager, MD  insulin lispro (HUMALOG KWIKPEN) 100 UNIT/ML KiwkPen INJECT 5 TO 10 UNITS SUBCUTANEOUSLY BEFORE MEALS Patient taking differently: Inject 5 Units into the skin 3 (three) times daily.  08/21/16  Yes Sowles, Drue Stager, MD  loratadine (CLARITIN) 10 MG tablet TAKE 1 TABLET BY MOUTH EVERY DAY FOR ALLERGIES 11/11/15  Yes Sowles, Drue Stager, MD  metoprolol (LOPRESSOR) 100 MG tablet Take 1 tablet (100 mg total) by mouth 2 (two) times daily. 08/21/16  Yes Sowles, Drue Stager, MD  montelukast (SINGULAIR) 10 MG tablet Take 1 tablet (10 mg total) by mouth daily. 08/21/16  Yes Sowles, Drue Stager, MD  pravastatin (PRAVACHOL) 40 MG tablet TAKE 1 TABLET BY MOUTH ONCE A DAY FOR CHOLESTEROL 08/21/16  Yes Sowles, Drue Stager, MD  ranitidine (ZANTAC) 150 MG tablet TAKE 1 TABLET (150 MG TOTAL) BY MOUTH 2 (TWO) TIMES DAILY. TRYING TO WEAN OFF OMEPRAZOLE 02/13/17  Yes Sowles, Drue Stager, MD  Vitamin D, Ergocalciferol, (DRISDOL) 50000 units CAPS capsule TAKE ONE CAPSULE BY MOUTH ONCE A WEEK 03/01/17  Yes Ancil Boozer, Drue Stager, MD  ACCU-CHEK AVIVA PLUS test strip CHECK BLOOD SUGAR TWICE DAILY 10/03/16   Ancil Boozer,  Drue Stager, MD  NOVOFINE 32G X 6 MM MISC USE 4 TIMES A DAY AS DIRECTED 02/19/17   Steele Sizer, MD  nystatin (NYSTATIN) powder Apply 1 g topically 3 (three) times daily. 12/24/16   Hubbard Hartshorn, FNP    Allergies Ace inhibitors  Family History  Problem Relation Age of Onset  . Diabetes type II Unknown     Social History Social History  Substance Use Topics  . Smoking status: Former Smoker    Types: Cigarettes    Quit date: 07/14/1994  . Smokeless tobacco: Never Used  . Alcohol use No    Review of Systems Level 5 caveat:  history/ROS limited by acute/critical illness ____________________________________________   PHYSICAL EXAM:  ED Triage Vitals  Enc Vitals Group     BP 04/19/17 0552 (!) 159/65     Pulse Rate 04/19/17 0600 (!) 58     Resp 04/19/17  0546 (!) 23     Temp 04/19/17 0552 97.7 F (36.5 C)     Temp Source 04/19/17 0552 Axillary     SpO2 04/19/17 0539 (!) 60 %     Weight 04/19/17 0541 (!) 138.3 kg (305 lb)     Height --      Head Circumference --      Peak Flow --      Pain Score --      Pain Loc --      Pain Edu? --      Excl. in [place]? --      Constitutional: Alert and oriented. Severe respiratory distress Eyes: Conjunctivae are normal.  Head: Atraumatic. Cardiovascular: Normal rate, regular rhythm. Good peripheral circulation.  Respiratory: Severe respiratory distress, intercostal muscle retractions, diminished breath sounds throughout although body habitus greatly limits the ability to auscultate Gastrointestinal: Morbid obesity.  Soft and nontender. No distention.  Musculoskeletal: Chronic skin changes in lower extremities but no pitting edema at this time Neurologic:  Normal speech and language. No gross focal neurologic deficits are appreciated.  Skin:  Skin is warm, dry and intact. No rash noted. Psychiatric: Mood and affect are normal. Speech and behavior are normal.  ____________________________________________   LABS (all labs ordered are  listed, but only abnormal results are displayed)  Labs Reviewed  COMPREHENSIVE METABOLIC PANEL - Abnormal; Notable for the following:       Result Value   Potassium 3.1 (*)    Glucose, Bld 205 (*)    BUN 24 (*)    Creatinine, Ser 1.86 (*)    Calcium 8.6 (*)    Albumin 3.1 (*)    AST 13 (*)    ALT 9 (*)    GFR calc non Af Amer 26 (*)    GFR calc Af Amer 30 (*)    All other components within normal limits  BRAIN NATRIURETIC PEPTIDE - Abnormal; Notable for the following:    B Natriuretic Peptide 370.0 (*)    All other components within normal limits  TROPONIN I - Abnormal; Notable for the following:    Troponin I 0.03 (*)    All other components within normal limits  CBC WITH DIFFERENTIAL/PLATELET - Abnormal; Notable for the following:    WBC 16.1 (*)    RBC 3.76 (*)    Hemoglobin 7.9 (*)    HCT 26.0 (*)    MCV 69.1 (*)    MCH 21.0 (*)    MCHC 30.3 (*)    RDW 19.2 (*)    Neutro Abs 14.6 (*)    Lymphs Abs 0.8 (*)    All other components within normal limits  BLOOD GAS, ARTERIAL - Abnormal; Notable for the following:    pH, Arterial 7.28 (*)    pCO2 arterial 82 (*)    pO2, Arterial 202 (*)    Bicarbonate 38.5 (*)    Acid-Base Excess 10.2 (*)    All other components within normal limits  LIPASE, BLOOD  PROTIME-INR   ____________________________________________  EKG  ED ECG REPORT I, Minh Jasper, the attending physician, personally viewed and interpreted this ECG.  Date: 04/19/2017 EKG Time: 5:45 AM Rate: 72 Rhythm: normal sinus rhythm with one PVC QRS Axis: normal Intervals: normal ST/T Wave abnormalities: Non-specific ST segment / T-wave changes, but no evidence of acute ischemia. Narrative Interpretation: no evidence of acute ischemia   ____________________________________________  RADIOLOGY I, Dastan Krider, personally viewed and evaluated these images (plain radiographs) as part of my medical decision  making, as well as reviewing the written report by  the radiologist.   Dg Chest Port 1 View  Result Date: 04/19/2017 CLINICAL DATA:  Respiratory distress EXAM: PORTABLE CHEST 1 VIEW COMPARISON:  Chest radiograph 11/07/2016 FINDINGS: Unchanged cardiomegaly. Bibasilar veiling opacities, likely pleural effusions. Mild-to-moderate pulmonary edema. Limited assessment of the lung bases due to poor penetration. IMPRESSION: Cardiomegaly, mild to moderate pulmonary edema and bilateral pleural effusions consistent with congestive heart failure. Limited assessment of the lung bases due to poor penetration. Electronically Signed   By: Ulyses Jarred M.D.   On: 04/19/2017 06:20    ____________________________________________   PROCEDURES  Critical Care performed: Yes, see critical care procedure note(s)   Procedure(s) performed:   Procedures   ____________________________________________   INITIAL IMPRESSION / ASSESSMENT AND PLAN / ED COURSE     The patient presents in severe respiratory distress and we started her on BiPAP immediately.  I will evaluate broadly and the differential diagnosis includes all the usual potentially life-threatening causes of respiratory distress including but not limited to CHF exacerbation, PE, COPD exacerbation, ACS, infectious/pneumonia, etc., but the patient has a history of CHF, does not have a history of COPD, is not having chest pain, and I feel that her signs and symptoms are most consistent with CHF.  We will obtain an ABG after she starts on BiPAP and standard lab work is pending.  The patient already feels much better on positive pressure ventilation.  No indication for intubation at this time.  Clinical Course as of Apr 19 712  Sun Apr 19, 2017  5009 Severely elevated pCO2, continuing with Bipap.  Slightly elevated BNP, CXR indicative of CHF exacerbation.  Giving Lasix 40 mg IV, continuing Bipap.  No evidence of infection at this time.  Discussed with Dr. Marcille Blanco with the hospitalist service. pCO2 arterial:  (!!) 82 [CF]  0710 Troponin is very slightly elevated at 0.03 which is most likely indicative of demand ischemia.  Potassium is decreased at 3.1 but I will leave it to the hospitalist service to replenish as they see fit.  Leukocytosis of 16.1, unclear if this is secondary to an infectious process not yet identified or if this is due to stress reaction from her respiratory distress.  [CF]    Clinical Course User Index [CF] Hinda Kehr, MD    ____________________________________________  FINAL CLINICAL IMPRESSION(S) / ED DIAGNOSES  Final diagnoses:  Acute respiratory failure with hypoxia and hypercapnia (HCC)  Acute on chronic congestive heart failure, unspecified heart failure type ([place])  Elevated troponin I level  Hypokalemia     MEDICATIONS GIVEN DURING THIS VISIT:  Medications  furosemide (LASIX) injection 40 mg (40 mg Intravenous Given 04/19/17 0650)     NEW OUTPATIENT MEDICATIONS STARTED DURING THIS VISIT:  New Prescriptions   No medications on file    Modified Medications   No medications on file    Discontinued Medications   No medications on file     Note:  This document was prepared using Dragon voice recognition software and may include unintentional dictation errors.    Hinda Kehr, MD 04/19/17 [phone]

## 2017-04-19 NOTE — ED Notes (Signed)
Spoke with Dr. Darvin Neighbours, start Lasix 60mg  at 2pm.

## 2017-04-19 NOTE — Progress Notes (Signed)
Pt taken off bipap and placed on nasal cannula, tolerating well at this time, will continue to monitor, respiratory rate 18/min, sats 99%

## 2017-04-19 NOTE — Progress Notes (Signed)
Found patient on 100% FiO2, decreased to 80%. Saturations are 100% at this time.

## 2017-04-19 NOTE — H&P (Signed)
[place] [facility] [place] NAME: Veronica Anderson    MR#:  [id number]  DATE OF BIRTH:  1946/01/29  DATE OF ADMISSION:  04/19/2017  PRIMARY CARE PHYSICIAN: Steele Sizer, MD   REQUESTING/REFERRING PHYSICIAN: Dr. Jimmye Norman  CHIEF COMPLAINT:   Chief Complaint  Patient presents with  . Respiratory Distress    HISTORY OF PRESENT ILLNESS:  Veronica Anderson  is a 71 y.o. female with a known history of Chronic diastolic congestive heart failure, hypertension, diabetes, CK D stage III, morbid obesity presents to the hospital brought in through EMS after family noticed patient has been progressively worsening with the breathing status over the last few days. Today they tried to bring her to the emergency room for a private car but she was extremely labored and EMS was called. Saturations were in the 55s. Was placed on a CPAP and brought to the emergency room. Patient wears CPAP at home with nighttime oxygen. No change in Lasix dose. Here patient was found to have pulmonary edema with bilateral pleural effusions on chest x-ray.  PAST MEDICAL HISTORY:   Past Medical History:  Diagnosis Date  . Allergy   . Anemia   . Asthma   . Cataract    bilateral  . CHF (NYHA class II, ACC/AHA stage C) ([place])   . Chronic kidney disease    stage IV (severe), Dr. Aleene Davidson  . Constipation   . Diabetes mellitus without complication ([place])   . Edema    feet/ankles  . GERD (gastroesophageal reflux disease)   . Hyperlipidemia   . Hypertension   . Left ankle pain   . Lumbago   . Myocardial infarction ([place])   . Neuropathy   . Neuropathy   . OA (osteoarthritis)   . Obesity   . Obstructive sleep apnea syndrome    CPAP  . Paresthesia    Foot  . Protein calorie malnutrition ([place])   . Requires supplemental oxygen   . Stroke ([place])   . Vitamin D deficiency     PAST SURGICAL HISTORY:   Past Surgical History:  Procedure Laterality Date  . ANKLE SURGERY Left   . CATARACT  EXTRACTION W/PHACO Right 12/20/2015   Procedure: CATARACT EXTRACTION PHACO AND INTRAOCULAR LENS PLACEMENT (IOC);  Surgeon: Eulogio Bear, MD;  Location: [facility] ORS;  Service: Ophthalmology;  Laterality: Right;  [place] 2.50 AP% 21.8 CDE 37.04 Fluid pack lot # [id number]  . CATARACT EXTRACTION W/PHACO Left 02/14/2016   Procedure: CATARACT EXTRACTION PHACO AND INTRAOCULAR LENS PLACEMENT (IOC);  Surgeon: Eulogio Bear, MD;  Location: [facility] ORS;  Service: Ophthalmology;  Laterality: Left;  [place] 3.12 AP% 42.1 CDE 47.12 Fluid Pack lot # [id number]  . COLONOSCOPY    . FRACTURE SURGERY Left    ankle    SOCIAL HISTORY:   Social History  Substance Use Topics  . Smoking status: Former Smoker    Types: Cigarettes    Quit date: 07/14/1994  . Smokeless tobacco: Never Used  . Alcohol use No    FAMILY HISTORY:   Family History  Problem Relation Age of Onset  . Diabetes type II Unknown     DRUG ALLERGIES:   Allergies  Allergen Reactions  . Ace Inhibitors Cough    REVIEW OF SYSTEMS:   Review of Systems  Unable to perform ROS: Severe respiratory distress    MEDICATIONS AT HOME:   Prior to Admission medications   Medication Sig Start Date End Date Taking? Authorizing Provider  amLODipine (  NORVASC) 10 MG tablet TAKE 1 TABLET (10 MG TOTAL) BY MOUTH DAILY. 02/13/17  Yes Steele Sizer, MD  aspirin EC 81 MG tablet Take 81 mg by mouth at bedtime.   Yes [provider]  cloNIDine (CATAPRES - DOSED IN MG/24 HR) 0.3 mg/24hr patch PLACE 1 PATCH (0.3 MG TOTAL) ONTO THE SKIN ONCE A WEEK. APPLY PATCH ON SUNDAY. 03/31/17  Yes Sowles, Drue Stager, MD  desonide (DESOWEN) 0.05 % ointment Apply 1 application topically 2 (two) times daily. 08/22/16  Yes Sowles, Drue Stager, MD  furosemide (LASIX) 20 MG tablet Take 1 tablet (20 mg total) by mouth 2 (two) times daily. 08/21/16  Yes Sowles, Drue Stager, MD  gabapentin (NEURONTIN) 300 MG capsule TAKE 1 CAPSULE (300 MG TOTAL) BY MOUTH 3 (THREE) TIMES DAILY. 11/26/16  Yes  Sowles, Drue Stager, MD  hydrALAZINE (APRESOLINE) 25 MG tablet Take 1 tablet (25 mg total) by mouth 3 (three) times daily. 04/17/17  Yes Sowles, Drue Stager, MD  Insulin Glargine (LANTUS SOLOSTAR) 100 UNIT/ML Solostar Pen INJECT 50 UNITS SUBCUTANEOUSLY DAILY 11/12/16  Yes Sowles, Drue Stager, MD  insulin lispro (HUMALOG KWIKPEN) 100 UNIT/ML KiwkPen INJECT 5 TO 10 UNITS SUBCUTANEOUSLY BEFORE MEALS Patient taking differently: Inject 5 Units into the skin 3 (three) times daily.  08/21/16  Yes Sowles, Drue Stager, MD  loratadine (CLARITIN) 10 MG tablet TAKE 1 TABLET BY MOUTH EVERY DAY FOR ALLERGIES 11/11/15  Yes Sowles, Drue Stager, MD  metoprolol (LOPRESSOR) 100 MG tablet Take 1 tablet (100 mg total) by mouth 2 (two) times daily. 08/21/16  Yes Sowles, Drue Stager, MD  montelukast (SINGULAIR) 10 MG tablet Take 1 tablet (10 mg total) by mouth daily. 08/21/16  Yes Sowles, Drue Stager, MD  pravastatin (PRAVACHOL) 40 MG tablet TAKE 1 TABLET BY MOUTH ONCE A DAY FOR CHOLESTEROL 08/21/16  Yes Sowles, Drue Stager, MD  ranitidine (ZANTAC) 150 MG tablet TAKE 1 TABLET (150 MG TOTAL) BY MOUTH 2 (TWO) TIMES DAILY. TRYING TO WEAN OFF OMEPRAZOLE 02/13/17  Yes Sowles, Drue Stager, MD  Vitamin D, Ergocalciferol, (DRISDOL) 50000 units CAPS capsule TAKE ONE CAPSULE BY MOUTH ONCE A WEEK 03/01/17  Yes Ancil Boozer, Drue Stager, MD  ACCU-CHEK AVIVA PLUS test strip CHECK BLOOD SUGAR TWICE DAILY 10/03/16   Ancil Boozer, Drue Stager, MD  NOVOFINE 32G X 6 MM MISC USE 4 TIMES A DAY AS DIRECTED 02/19/17   Steele Sizer, MD  nystatin (NYSTATIN) powder Apply 1 g topically 3 (three) times daily. 12/24/16   Hubbard Hartshorn, FNP     VITAL SIGNS:  Blood pressure 137/70, pulse (!) 59, temperature 97.7 F (36.5 C), temperature source Axillary, resp. rate 17, height 5\' 5"  (1.651 m), weight (!) 138.3 kg (305 lb), SpO2 100 %.  PHYSICAL EXAMINATION:  Physical Exam  GENERAL:  71 y.o.-year-old patient lying in the bed . Morbidly obese. Looks critically ill. On BiPAP. EYES: Pupils equal, round, reactive to  light and accommodation. No scleral icterus. Extraocular muscles intact.  HEENT: Head atraumatic, normocephalic. Oropharynx and nasopharynx clear. No oropharyngeal erythema, moist oral mucosa  NECK:  Supple, no jugular venous distention. No thyroid enlargement, no tenderness.  LUNGS: Decreased air entry bilaterally CARDIOVASCULAR: S1, S2 normal. No murmurs, rubs, or gallops.  ABDOMEN: Soft, nontender, nondistended. Bowel sounds present. No organomegaly or mass.  EXTREMITIES: No pedal edema, cyanosis, or clubbing. + 2 pedal & radial pulses b/l.   PSYCHIATRIC: The patient is drowsy SKIN: No obvious rash, lesion, or ulcer.   LABORATORY PANEL:   CBC  Recent Labs Lab 04/19/17 0542  WBC 16.1*  HGB 7.9*  HCT 26.0*  PLT 321   ------------------------------------------------------------------------------------------------------------------  Chemistries   Recent Labs Lab 04/19/17 0542  NA 145  K 3.1*  CL 101  CO2 32  GLUCOSE 205*  BUN 24*  CREATININE 1.86*  CALCIUM 8.6*  AST 13*  ALT 9*  ALKPHOS 98  BILITOT 1.0   ------------------------------------------------------------------------------------------------------------------  Cardiac Enzymes  Recent Labs Lab 04/19/17 0542  TROPONINI 0.03*   ------------------------------------------------------------------------------------------------------------------  RADIOLOGY:  Dg Chest Port 1 View  Result Date: 04/19/2017 CLINICAL DATA:  Respiratory distress EXAM: PORTABLE CHEST 1 VIEW COMPARISON:  Chest radiograph 11/07/2016 FINDINGS: Unchanged cardiomegaly. Bibasilar veiling opacities, likely pleural effusions. Mild-to-moderate pulmonary edema. Limited assessment of the lung bases due to poor penetration. IMPRESSION: Cardiomegaly, mild to moderate pulmonary edema and bilateral pleural effusions consistent with congestive heart failure. Limited assessment of the lung bases due to poor penetration. Electronically Signed   By:  Ulyses Jarred M.D.   On: 04/19/2017 06:20     IMPRESSION AND PLAN:   * Acute on chronic diastolic congestive heart failure - IV Lasix, Beta blockers - Input and Output - Monitor Bun/Cr and Potassium - Echo- reviewed -Cardiology follow up after discharge  * Hypertension. We'll resume metoprolol. And clonidine. Can add amlodipine and hydralazine. Blood pressure uncontrolled.  * Insulin-dependent diabetes mellitus. Ordered lower dose of Lantus at 30 units. Sliding scale insulin.  * CKD stage III is stable. Monitor while being diuresed.  * DVT prophylaxis with Lovenox  Patient is critically ill with high risk for deterioration, cardiac arrest and death.   All the records are reviewed and case discussed with ED provider. Management plans discussed with the patient, family and they are in agreement.  CODE STATUS: FULL CODE  TOTAL CC TIME TAKING CARE OF THIS PATIENT: 40 minutes.   Hillary Bow R M.D on 04/19/2017 at 8:16 AM  Between 7am to 6pm - Pager - [phone]  After 6pm go to [email or web address] - password [email or web address] [employer] [facility]  Office  [phone]  CC: Primary care physician; Steele Sizer, MD  Note: This dictation was prepared with Dragon dictation along with smaller phrase technology. Any transcriptional errors that result from this process are unintentional.

## 2017-04-19 NOTE — ED Notes (Signed)
Son at bedside while admitting MD in room.  D/w him waiting on bed assignment. Son verbalized understanding.

## 2017-04-19 NOTE — ED Notes (Signed)
Attempted to call report to ICU, nurse unavailable at this time.

## 2017-04-19 NOTE — Consult Note (Signed)
[facility] Consultation       ASSESSMENT/PLAN   Congestive heart failure. HFpEF, we'll continue noninvasive ventilation, diuresis as tolerated,blood pressure control, serial EKG, echocardiagram. Patient with elevated BNP, troponin at 0.03, EKG consistent with left axis deviation.  Acute on chronic hypercapnic respiratory failure. Patient is on home CPAP, suspect obesity hypoventilation syndrome  Hypokalemia. We will replace  Leukocytosis No clinical evidence of infet this time will follow closely  Anemia. Stable hemodynamics, no evidence of active bleeding    Name: Veronica Anderson MRN: [id number] DOB: 01/11/1946    ADMISSION DATE:  04/19/2017 CONSULTATION DATE:  04/19/2017  REFERRING MD :  Hospitalist  CHIEF COMPLAINT:  Shortness of Breath   HISTORY OF PRESENT ILLNESS:  Veronica Anderson is a 71 year old African-American female with a past medical history remarkable for hypertension, diabetes, hyperlipidemia, peripheral neuopathy, HFpEF ecurrent, underlying history of CVA, morbid obesity with obstructive sleep apnea, renal insufficiency developed progressive increasing shortness of breath, occurring over the past several days. She states that her allergies began acting up last week. EMS was called and she was brought into the emergency department where she was noted to be in acute on chronic hypercapnic respiratory failure, was started on noninvasive ventilation, given IV Lasix. Chest x-ray revealed congestive heart failure, elevated BNP at 370, troponin at 0.03, B24, creatinine 1.86. Leukocytosis at 16 in hemoglobin at 7.9.   PAST MEDICAL HISTORY :  Past Medical History:  Diagnosis Date  . Allergy   . Anemia   . Asthma   . Cataract    bilateral  . CHF (NYHA class II, ACC/AHA stage C) ([place])   . Chronic kidney disease    stage IV (severe), Dr. Aleene Davidson  . Constipation   . Diabetes mellitus without complication ([place])   . Edema    feet/ankles  . GERD  (gastroesophageal reflux disease)   . Hyperlipidemia   . Hypertension   . Left ankle pain   . Lumbago   . Myocardial infarction ([place])   . Neuropathy   . Neuropathy   . OA (osteoarthritis)   . Obesity   . Obstructive sleep apnea syndrome    CPAP  . Paresthesia    Foot  . Protein calorie malnutrition ([place])   . Requires supplemental oxygen   . Stroke ([place])   . Vitamin D deficiency    Past Surgical History:  Procedure Laterality Date  . ANKLE SURGERY Left   . CATARACT EXTRACTION W/PHACO Right 12/20/2015   Procedure: CATARACT EXTRACTION PHACO AND INTRAOCULAR LENS PLACEMENT (IOC);  Surgeon: Eulogio Bear, MD;  Location: [facility] ORS;  Service: Ophthalmology;  Laterality: Right;  [place] 2.50 AP% 21.8 CDE 37.04 Fluid pack lot # [id number]  . CATARACT EXTRACTION W/PHACO Left 02/14/2016   Procedure: CATARACT EXTRACTION PHACO AND INTRAOCULAR LENS PLACEMENT (IOC);  Surgeon: Eulogio Bear, MD;  Location: [facility] ORS;  Service: Ophthalmology;  Laterality: Left;  [place] 3.12 AP% 42.1 CDE 47.12 Fluid Pack lot # [id number]  . COLONOSCOPY    . FRACTURE SURGERY Left    ankle   Prior to Admission medications   Medication Sig Start Date End Date Taking? Authorizing Provider  amLODipine (NORVASC) 10 MG tablet TAKE 1 TABLET (10 MG TOTAL) BY MOUTH DAILY. 02/13/17  Yes Steele Sizer, MD  aspirin EC 81 MG tablet Take 81 mg by mouth at bedtime.   Yes [provider]  cloNIDine (CATAPRES - DOSED IN MG/24 HR) 0.3 mg/24hr patch PLACE 1 PATCH (0.3 MG TOTAL) ONTO THE SKIN  ONCE A WEEK. APPLY PATCH ON SUNDAY. 03/31/17  Yes Sowles, Drue Stager, MD  desonide (DESOWEN) 0.05 % ointment Apply 1 application topically 2 (two) times daily. 08/22/16  Yes Sowles, Drue Stager, MD  furosemide (LASIX) 20 MG tablet Take 1 tablet (20 mg total) by mouth 2 (two) times daily. 08/21/16  Yes Sowles, Drue Stager, MD  gabapentin (NEURONTIN) 300 MG capsule TAKE 1 CAPSULE (300 MG TOTAL) BY MOUTH 3 (THREE) TIMES DAILY. 11/26/16  Yes Sowles, Drue Stager, MD    hydrALAZINE (APRESOLINE) 25 MG tablet Take 1 tablet (25 mg total) by mouth 3 (three) times daily. 04/17/17  Yes Sowles, Drue Stager, MD  Insulin Glargine (LANTUS SOLOSTAR) 100 UNIT/ML Solostar Pen INJECT 50 UNITS SUBCUTANEOUSLY DAILY 11/12/16  Yes Sowles, Drue Stager, MD  insulin lispro (HUMALOG KWIKPEN) 100 UNIT/ML KiwkPen INJECT 5 TO 10 UNITS SUBCUTANEOUSLY BEFORE MEALS Patient taking differently: Inject 5 Units into the skin 3 (three) times daily.  08/21/16  Yes Sowles, Drue Stager, MD  loratadine (CLARITIN) 10 MG tablet TAKE 1 TABLET BY MOUTH EVERY DAY FOR ALLERGIES 11/11/15  Yes Sowles, Drue Stager, MD  metoprolol (LOPRESSOR) 100 MG tablet Take 1 tablet (100 mg total) by mouth 2 (two) times daily. 08/21/16  Yes Sowles, Drue Stager, MD  montelukast (SINGULAIR) 10 MG tablet Take 1 tablet (10 mg total) by mouth daily. 08/21/16  Yes Sowles, Drue Stager, MD  pravastatin (PRAVACHOL) 40 MG tablet TAKE 1 TABLET BY MOUTH ONCE A DAY FOR CHOLESTEROL 08/21/16  Yes Sowles, Drue Stager, MD  ranitidine (ZANTAC) 150 MG tablet TAKE 1 TABLET (150 MG TOTAL) BY MOUTH 2 (TWO) TIMES DAILY. TRYING TO WEAN OFF OMEPRAZOLE 02/13/17  Yes Sowles, Drue Stager, MD  Vitamin D, Ergocalciferol, (DRISDOL) 50000 units CAPS capsule TAKE ONE CAPSULE BY MOUTH ONCE A WEEK 03/01/17  Yes Ancil Boozer, Drue Stager, MD  ACCU-CHEK AVIVA PLUS test strip CHECK BLOOD SUGAR TWICE DAILY 10/03/16   Ancil Boozer, Drue Stager, MD  NOVOFINE 32G X 6 MM MISC USE 4 TIMES A DAY AS DIRECTED 02/19/17   Steele Sizer, MD  nystatin (NYSTATIN) powder Apply 1 g topically 3 (three) times daily. 12/24/16   Hubbard Hartshorn, FNP   Allergies  Allergen Reactions  . Ace Inhibitors Cough    FAMILY HISTORY:  Family History  Problem Relation Age of Onset  . Diabetes type II Unknown    SOCIAL HISTORY:  reports that she quit smoking about 22 years ago. Her smoking use included Cigarettes. She has never used smokeless tobacco. She reports that she does not drink alcohol or use drugs.  REVIEW OF SYSTEMS:    The remainder of  systems were reviewed and were found to be negative other than what is documented in the HPI.    VITAL SIGNS: Temp:  [97.7 F (36.5 C)-97.8 F (36.6 C)] 97.8 F (36.6 C) (10/07 1000) Pulse Rate:  [55-65] 65 (10/07 1000) Resp:  [14-26] 26 (10/07 1000) BP: (129-159)/(49-75) 151/66 (10/07 1000) SpO2:  [60 %-100 %] 100 % (10/07 1000) FiO2 (%):  [100 %] 100 % (10/07 0747) Weight:  [138.3 kg (305 lb)] 138.3 kg (305 lb) (10/07 0746) HEMODYNAMICS:   VENTILATOR SETTINGS: Presently on BiPAP  Physical Examination:   VS: BP (!) 151/66   Pulse 65   Temp 97.8 F (36.6 C)   Resp (!) 26   Ht 5\' 5"  (1.651 m)   Wt (!) 138.3 kg (305 lb)   SpO2 100%   BMI 50.75 kg/m   General Appearance: Patient in mild respiratory distress on BiPAP mild accessory muscle utilization Neuro:without focal findings, mental status,  speech normal,. HEENT: PERRLA, EOM intact, no ptosis, no other lesions noticed;  Pulmonary: Bilateral rales appreciated posteriorly Cardiovascular Normal M6,K8. 2/6 systolic murmur left parasternal border   Abdomen: Benign, Soft, non-tender, No masses, hepatosplenomegaly, No lymphadenopathy Endoc: No evident thyromegaly, no signs of acromegaly. Skin:   warm, no rashes, no ecchymosis  Extremities: normal, no cyanosis, clubbing, wrinkly skin,recently diuresed changes  Consistent with chronic venous stasis   LABS: Reviewed   LABORATORY PANEL:   CBC  Recent Labs Lab 04/19/17 0542  WBC 16.1*  HGB 7.9*  HCT 26.0*  PLT 321    Chemistries   Recent Labs Lab 04/19/17 0542  NA 145  K 3.1*  CL 101  CO2 32  GLUCOSE 205*  BUN 24*  CREATININE 1.86*  CALCIUM 8.6*  AST 13*  ALT 9*  ALKPHOS 98  BILITOT 1.0     Recent Labs Lab 04/19/17 1010  GLUCAP 146*    Recent Labs Lab 04/19/17 0542  PHART 7.28*  PCO2ART 82*  PO2ART 202*    Recent Labs Lab 04/19/17 0542  AST 13*  ALT 9*  ALKPHOS 98  BILITOT 1.0  ALBUMIN 3.1*    Cardiac Enzymes  Recent  Labs Lab 04/19/17 0542  TROPONINI 0.03*    RADIOLOGY:  Dg Chest Port 1 View  Result Date: 04/19/2017 CLINICAL DATA:  Respiratory distress EXAM: PORTABLE CHEST 1 VIEW COMPARISON:  Chest radiograph 11/07/2016 FINDINGS: Unchanged cardiomegaly. Bibasilar veiling opacities, likely pleural effusions. Mild-to-moderate pulmonary edema. Limited assessment of the lung bases due to poor penetration. IMPRESSION: Cardiomegaly, mild to moderate pulmonary edema and bilateral pleural effusions consistent with congestive heart failure. Limited assessment of the lung bases due to poor penetration. Electronically Signed   By: Ulyses Jarred M.D.   On: 04/19/2017 06:20    Hermelinda Dellen, DO 04/19/2017, 10:37 AM

## 2017-04-19 NOTE — ED Triage Notes (Signed)
Per EMS:  Patient c/o SOB for several days. Pt's son tried to get patient to private vehicle and patient's breathing became too labored to continue. Fire department's initial O2 saturation approx 60%. Patient placed on CPAP. Patient's work of breathing improved and patient's oxygen saturation increased to 97%.

## 2017-04-19 NOTE — ED Notes (Signed)
Attempted to call report to ICU again, nurse unavailable at this time.

## 2017-04-19 NOTE — ED Notes (Signed)
Pt resting quietly in bed, son at bedside.  Bipap in place. Awaiting admitting doctor to see patient.

## 2017-04-19 NOTE — Progress Notes (Signed)
Anticoagulation monitoring(Lovenox):  71yo  female ordered Lovenox 40 mg Q24h  Filed Weights   04/19/17 0541 04/19/17 0746  Weight: (!) 305 lb (138.3 kg) (!) 305 lb (138.3 kg)   Body mass index is 50.75 kg/m.   Lab Results  Component Value Date   CREATININE 1.86 (H) 04/19/2017   CREATININE 1.97 (H) 11/10/2016   CREATININE 1.88 (H) 11/09/2016   Estimated Creatinine Clearance: 39.2 mL/min (A) (by C-G formula based on SCr of 1.86 mg/dL (H)). Hemoglobin & Hematocrit     Component Value Date/Time   HGB 7.9 (L) 04/19/2017 0542   HGB 11.8 11/14/2015 1023   HCT 26.0 (L) 04/19/2017 0542   HCT 38.0 11/14/2015 1023     Per Protocol for Patient with estCrcl > 30 ml/min and BMI > 40, will transition to Lovenox 40 mg Q12h.

## 2017-04-20 LAB — BASIC METABOLIC PANEL
Anion gap: 10 (ref 5–15)
BUN: 23 mg/dL — ABNORMAL HIGH (ref 6–20)
CALCIUM: 8.3 mg/dL — AB (ref 8.9–10.3)
CHLORIDE: 104 mmol/L (ref 101–111)
CO2: 35 mmol/L — ABNORMAL HIGH (ref 22–32)
CREATININE: 1.55 mg/dL — AB (ref 0.44–1.00)
GFR, EST AFRICAN AMERICAN: 38 mL/min — AB (ref >60)
GFR, EST NON AFRICAN AMERICAN: 33 mL/min — AB (ref >60)
Glucose, Bld: 93 mg/dL (ref 65–99)
Potassium: 4.1 mmol/L (ref 3.5–5.1)
SODIUM: 149 mmol/L — AB (ref 135–145)

## 2017-04-20 LAB — CBC
HCT: 23.1 % — ABNORMAL LOW (ref 35.0–47.0)
Hemoglobin: 7.2 g/dL — ABNORMAL LOW (ref 12.0–16.0)
MCH: 21.7 pg — ABNORMAL LOW (ref 26.0–34.0)
MCHC: 31 g/dL — ABNORMAL LOW (ref 32.0–36.0)
MCV: 69.9 fL — ABNORMAL LOW (ref 80.0–100.0)
PLATELETS: 235 10*3/uL (ref 150–440)
RBC: 3.31 MIL/uL — AB (ref 3.80–5.20)
RDW: 19.1 % — ABNORMAL HIGH (ref 11.5–14.5)
WBC: 10.7 10*3/uL (ref 3.6–11.0)

## 2017-04-20 LAB — GLUCOSE, CAPILLARY
GLUCOSE-CAPILLARY: 87 mg/dL (ref 65–99)
GLUCOSE-CAPILLARY: 113 mg/dL — AB (ref 65–99)
Glucose-Capillary: 143 mg/dL — ABNORMAL HIGH (ref 65–99)
Glucose-Capillary: 161 mg/dL — ABNORMAL HIGH (ref 65–99)

## 2017-04-20 LAB — HEMOGLOBIN A1C
HEMOGLOBIN A1C: 5.7 % — AB (ref 4.8–5.6)
MEAN PLASMA GLUCOSE: 116.89 mg/dL

## 2017-04-20 MED ORDER — INFLUENZA VAC SPLIT HIGH-DOSE 0.5 ML IM SUSY
0.5000 mL | PREFILLED_SYRINGE | INTRAMUSCULAR | Status: AC
  Administered 2017-04-21: 0.5 mL via INTRAMUSCULAR
  Filled 2017-04-20 (×2): qty 0.5

## 2017-04-20 MED ORDER — IPRATROPIUM-ALBUTEROL 0.5-2.5 (3) MG/3ML IN SOLN
3.0000 mL | RESPIRATORY_TRACT | Status: DC
  Administered 2017-04-20 – 2017-04-22 (×12): 3 mL via RESPIRATORY_TRACT
  Filled 2017-04-20 (×11): qty 3

## 2017-04-20 MED ORDER — CLONIDINE HCL 0.1 MG/24HR TD PTWK
0.1000 mg | MEDICATED_PATCH | TRANSDERMAL | Status: DC
  Administered 2017-04-21 – 2017-04-27 (×2): 0.1 mg via TRANSDERMAL
  Filled 2017-04-20 (×2): qty 1

## 2017-04-20 MED ORDER — HYDRALAZINE HCL 20 MG/ML IJ SOLN
20.0000 mg | INTRAMUSCULAR | Status: DC | PRN
  Administered 2017-04-20 (×2): 20 mg via INTRAVENOUS
  Filled 2017-04-20 (×2): qty 1

## 2017-04-20 NOTE — Progress Notes (Signed)
Veronica Anderson at [place] NAME: Veronica Anderson    MR#:  [id number]  DATE OF BIRTH:  06-03-1946  SUBJECTIVE:  CHIEF COMPLAINT:   Chief Complaint  Patient presents with  . Respiratory Distress   Patient still short of breath on BiPAP.  REVIEW OF SYSTEMS:    Review of Systems  Unable to perform ROS: Severe respiratory distress    DRUG ALLERGIES:   Allergies  Allergen Reactions  . Ace Inhibitors Cough    VITALS:  Blood pressure (!) 171/61, pulse 63, temperature 98.2 F (36.8 C), temperature source Axillary, resp. rate 13, height 5\' 5"  (1.651 m), weight (!) 142 kg (313 lb 0.9 oz), SpO2 99 %.  PHYSICAL EXAMINATION:   Physical Exam  GENERAL:  71 y.o.-year-old patient lying in the bed , Morbidly obese. Shortness of breath. On BiPAP. EYES: Pupils equal, round, reactive to light and accommodation. No scleral icterus. Extraocular muscles intact.  HEENT: Head atraumatic, normocephalic. Oropharynx and nasopharynx clear.  NECK:  Supple, no jugular venous distention. No thyroid enlargement, no tenderness.  LUNGS: Decreased air entry bilaterally CARDIOVASCULAR: S1, S2 normal. No murmurs, rubs, or gallops.  ABDOMEN: Soft, nontender, nondistended. Bowel sounds present. No organomegaly or mass.  EXTREMITIES: No cyanosis, clubbing or edema b/l.    NEUROLOGIC: Cranial nerves II through XII are intact. No focal Motor or sensory deficits b/l.   PSYCHIATRIC: The patient is drowsy SKIN: No obvious rash, lesion, or ulcer.   LABORATORY PANEL:   CBC  Recent Labs Lab 04/20/17 0441  WBC 10.7  HGB 7.2*  HCT 23.1*  PLT 235   ------------------------------------------------------------------------------------------------------------------ Chemistries   Recent Labs Lab 04/19/17 0542 04/19/17 2014 04/20/17 0441  NA 145 148* 149*  K 3.1* 3.3* 4.1  CL 101 101 104  CO2 32 35* 35*  GLUCOSE 205* 158* 93  BUN 24* 23* 23*  CREATININE 1.86* 1.74* 1.55*   CALCIUM 8.6* 8.5* 8.3*  MG  --  1.9  --   AST 13*  --   --   ALT 9*  --   --   ALKPHOS 98  --   --   BILITOT 1.0  --   --    ------------------------------------------------------------------------------------------------------------------  Cardiac Enzymes  Recent Labs Lab 04/19/17 0542  TROPONINI 0.03*   ------------------------------------------------------------------------------------------------------------------  RADIOLOGY:  Dg Chest Port 1 View  Result Date: 04/19/2017 CLINICAL DATA:  Respiratory distress EXAM: PORTABLE CHEST 1 VIEW COMPARISON:  Chest radiograph 11/07/2016 FINDINGS: Unchanged cardiomegaly. Bibasilar veiling opacities, likely pleural effusions. Mild-to-moderate pulmonary edema. Limited assessment of the lung bases due to poor penetration. IMPRESSION: Cardiomegaly, mild to moderate pulmonary edema and bilateral pleural effusions consistent with congestive heart failure. Limited assessment of the lung bases due to poor penetration. Electronically Signed   By: Ulyses Jarred M.D.   On: 04/19/2017 06:20     ASSESSMENT AND PLAN:   * Acute on chronic hypoxic respiratory failure Due to congestive heart failure. Continue BiPAP support. High risk for intubation. Patient is full code. Appreciate pulmonary help in the ICU.  * Acute on chronic diastolic congestive heart failure - IV Lasix, Beta blockers - Input and Output - Monitor Bun/Cr and Potassium - Echo- reviewed -Cardiology follow up after discharge Continue diuresis  * Hypertension. We'll resume metoprolol. And clonidine. Can add amlodipine and hydralazine. Blood pressure uncontrolled.  * Insulin-dependent diabetes mellitus. ON lantus. Sliding scale insulin.  * CKD stage III is stable. Monitor while being diuresed.  * DVT prophylaxis with Lovenox  Patient is critically ill with high risk for deterioration, cardiac arrest and death.  All the records are reviewed and case discussed with Care  Management/Social Worker Management plans discussed with the patient, family and they are in agreement.  CODE STATUS: FULL CODE  DVT Prophylaxis: SCDs  TOTAL TIME TAKING CARE OF THIS PATIENT: 30 minutes.   Hillary Bow R M.D on 04/20/2017 at 10:54 AM  Between 7am to 6pm - Pager - [phone]  After 6pm go to [email or web address] - password [email or web address] [facility]  Office  [phone]  CC: Primary care physician; Hermelinda Dellen, DO  Note: This dictation was prepared with Dragon dictation along with smaller phrase technology. Any transcriptional errors that result from this process are unintentional.

## 2017-04-20 NOTE — Progress Notes (Signed)
MEDICATION RELATED CONSULT NOTE - INITIAL   Pharmacy Consult for electrolyte management   Pharmacy consulted for electrolyte management for 71 yo female admitted with CHF exacerbation. Patient is currently ordered furosemide 89m IV Q8hr.   Plan:  Will check electrolytes with am labs.   Allergies  Allergen Reactions  . Ace Inhibitors Cough    Patient Measurements: Height: 5\' 5"  (165.1 cm) Weight: (!) 313 lb 0.9 oz (142 kg) IBW/kg (Calculated) : 57   Vital Signs: Temp: 98.4 F (36.9 C) (10/08 1600) Temp Source: Oral (10/08 1600) BP: 152/62 (10/08 2000) Pulse Rate: 71 (10/08 2000) Intake/Output from previous day: 10/07 0701 - 10/08 0700 In: 303 [I.V.:3; IV Piggyback:300] Out: 1500 [Urine:1500] Intake/Output from this shift: No intake/output data recorded.  Labs:  Recent Labs  04/19/17 0542 04/19/17 2014 04/20/17 0441  WBC 16.1* 10.5 10.7  HGB 7.9* 7.8* 7.2*  HCT 26.0* 25.6* 23.1*  PLT 321 248 235  CREATININE 1.86* 1.74* 1.55*  MG  --  1.9  --   PHOS  --  3.9  --   ALBUMIN 3.1*  --   --   PROT 7.9  --   --   AST 13*  --   --   ALT 9*  --   --   ALKPHOS 98  --   --   BILITOT 1.0  --   --    Estimated Creatinine Clearance: 47.8 mL/min (A) (by C-G formula based on SCr of 1.55 mg/dL (H)).   Medical History: Past Medical History:  Diagnosis Date  . Allergy   . Anemia   . Asthma   . Cataract    bilateral  . CHF (NYHA class II, ACC/AHA stage C) ([place])   . Chronic kidney disease    stage IV (severe), Dr. Aleene Davidson  . Constipation   . Diabetes mellitus without complication ([place])   . Edema    feet/ankles  . GERD (gastroesophageal reflux disease)   . Hyperlipidemia   . Hypertension   . Left ankle pain   . Lumbago   . Myocardial infarction ([place])   . Neuropathy   . Neuropathy   . OA (osteoarthritis)   . Obesity   . Obstructive sleep apnea syndrome    CPAP  . Paresthesia    Foot  . Protein calorie malnutrition ([place])   . Requires supplemental oxygen   .  Stroke ([place])   . Vitamin D deficiency     Pharmacy will continue to monitor and adjust per consult.   Albana Saperstein L 04/20/2017,10:33 PM

## 2017-04-21 DIAGNOSIS — I5021 Acute systolic (congestive) heart failure: Secondary | ICD-10-CM

## 2017-04-21 LAB — BASIC METABOLIC PANEL
ANION GAP: 9 (ref 5–15)
BUN: 26 mg/dL — ABNORMAL HIGH (ref 6–20)
CALCIUM: 8.4 mg/dL — AB (ref 8.9–10.3)
CO2: 38 mmol/L — AB (ref 22–32)
CREATININE: 1.84 mg/dL — AB (ref 0.44–1.00)
Chloride: 101 mmol/L (ref 101–111)
GFR calc Af Amer: 31 mL/min — ABNORMAL LOW (ref >60)
GFR, EST NON AFRICAN AMERICAN: 26 mL/min — AB (ref >60)
GLUCOSE: 126 mg/dL — AB (ref 65–99)
Potassium: 3.7 mmol/L (ref 3.5–5.1)
Sodium: 148 mmol/L — ABNORMAL HIGH (ref 135–145)

## 2017-04-21 LAB — CBC WITH DIFFERENTIAL/PLATELET
BASOS ABS: 0 10*3/uL (ref 0–0.1)
Basophils Relative: 0 %
EOS ABS: 0 10*3/uL (ref 0–0.7)
EOS PCT: 1 %
HCT: 25 % — ABNORMAL LOW (ref 35.0–47.0)
Hemoglobin: 7.6 g/dL — ABNORMAL LOW (ref 12.0–16.0)
Lymphocytes Relative: 10 %
Lymphs Abs: 0.6 10*3/uL — ABNORMAL LOW (ref 1.0–3.6)
MCH: 21.6 pg — AB (ref 26.0–34.0)
MCHC: 30.2 g/dL — ABNORMAL LOW (ref 32.0–36.0)
MCV: 71.6 fL — ABNORMAL LOW (ref 80.0–100.0)
MONO ABS: 0.6 10*3/uL (ref 0.2–0.9)
Monocytes Relative: 11 %
Neutro Abs: 4.7 10*3/uL (ref 1.4–6.5)
Neutrophils Relative %: 78 %
PLATELETS: 213 10*3/uL (ref 150–440)
RBC: 3.49 MIL/uL — AB (ref 3.80–5.20)
RDW: 19.4 % — AB (ref 11.5–14.5)
WBC: 6 10*3/uL (ref 3.6–11.0)

## 2017-04-21 LAB — GLUCOSE, CAPILLARY
GLUCOSE-CAPILLARY: 105 mg/dL — AB (ref 65–99)
GLUCOSE-CAPILLARY: 139 mg/dL — AB (ref 65–99)
Glucose-Capillary: 139 mg/dL — ABNORMAL HIGH (ref 65–99)
Glucose-Capillary: 128 mg/dL — ABNORMAL HIGH (ref 65–99)

## 2017-04-21 LAB — MAGNESIUM: Magnesium: 1.9 mg/dL (ref 1.7–2.4)

## 2017-04-21 NOTE — Progress Notes (Signed)
MEDICATION RELATED CONSULT NOTE - INITIAL   Pharmacy Consult for electrolyte management   Pharmacy consulted for electrolyte management for 71 yo female admitted with CHF exacerbation. Patient is currently ordered furosemide 28m IV Q8hr.   Plan:  No replacement warranted at this time. Will check electrolytes with am labs.   Allergies  Allergen Reactions  . Ace Inhibitors Cough    Patient Measurements: Height: 5\' 5"  (165.1 cm) Weight: (!) 315 lb 4.1 oz (143 kg) IBW/kg (Calculated) : 57   Vital Signs: Temp: 99.3 F (37.4 C) (10/09 2000) Temp Source: Oral (10/09 2000) BP: 164/65 (10/09 2000) Pulse Rate: 67 (10/09 2000) Intake/Output from previous day: 10/08 0701 - 10/09 0700 In: -  Out: 2700 [Urine:2700] Intake/Output from this shift: No intake/output data recorded.  Labs:  Recent Labs  04/19/17 0542 04/19/17 2014 04/20/17 0441 04/21/17 0519  WBC 16.1* 10.5 10.7 6.0  HGB 7.9* 7.8* 7.2* 7.6*  HCT 26.0* 25.6* 23.1* 25.0*  PLT 321 248 235 213  CREATININE 1.86* 1.74* 1.55* 1.84*  MG  --  1.9  --  1.9  PHOS  --  3.9  --   --   ALBUMIN 3.1*  --   --   --   PROT 7.9  --   --   --   AST 13*  --   --   --   ALT 9*  --   --   --   ALKPHOS 98  --   --   --   BILITOT 1.0  --   --   --    Estimated Creatinine Clearance: 40.5 mL/min (A) (by C-G formula based on SCr of 1.84 mg/dL (H)).   Medical History: Past Medical History:  Diagnosis Date  . Allergy   . Anemia   . Asthma   . Cataract    bilateral  . CHF (NYHA class II, ACC/AHA stage C) ([place])   . Chronic kidney disease    stage IV (severe), Dr. Aleene Davidson  . Constipation   . Diabetes mellitus without complication ([place])   . Edema    feet/ankles  . GERD (gastroesophageal reflux disease)   . Hyperlipidemia   . Hypertension   . Left ankle pain   . Lumbago   . Myocardial infarction ([place])   . Neuropathy   . Neuropathy   . OA (osteoarthritis)   . Obesity   . Obstructive sleep apnea syndrome    CPAP  .  Paresthesia    Foot  . Protein calorie malnutrition ([place])   . Requires supplemental oxygen   . Stroke ([place])   . Vitamin D deficiency     Pharmacy will continue to monitor and adjust per consult.   Simpson,Michael L 04/21/2017,8:14 PM

## 2017-04-21 NOTE — Progress Notes (Signed)
[facility] at [place] NAME: Veronica Anderson    MR#:  [id number]  DATE OF BIRTH:  [birth year]  SUBJECTIVE:  CHIEF COMPLAINT:   Chief Complaint  Patient presents with  . Respiratory Distress   Patient awake and still SOB.  Bipap and HFNC  REVIEW OF SYSTEMS:    Review of Systems  Unable to perform ROS: Severe respiratory distress    DRUG ALLERGIES:   Allergies  Allergen Reactions  . Ace Inhibitors Cough    VITALS:  Blood pressure (!) 168/53, pulse 62, temperature 98.6 F (37 C), temperature source Oral, resp. rate 17, height 5\' 5"  (1.651 m), weight (!) 143 kg (315 lb 4.1 oz), SpO2 100 %.  PHYSICAL EXAMINATION:   Physical Exam  GENERAL:  71 y.o.-year-old patient lying in the bed , Morbidly obese. Shortness of breath. On BiPAP. EYES: Pupils equal, round, reactive to light and accommodation. No scleral icterus. Extraocular muscles intact.  HEENT: Head atraumatic, normocephalic. Oropharynx and nasopharynx clear.  NECK:  Supple, no jugular venous distention. No thyroid enlargement, no tenderness.  LUNGS: Decreased air entry bilaterally CARDIOVASCULAR: S1, S2 normal. No murmurs, rubs, or gallops.  ABDOMEN: Soft, nontender, nondistended. Bowel sounds present. No organomegaly or mass.  EXTREMITIES: No cyanosis, clubbing or edema b/l.    NEUROLOGIC: Cranial nerves II through XII are intact. No focal Motor or sensory deficits b/l.   PSYCHIATRIC: The patient is drowsy SKIN: No obvious rash, lesion, or ulcer.   LABORATORY PANEL:   CBC  Recent Labs Lab 04/21/17 0519  WBC 6.0  HGB 7.6*  HCT 25.0*  PLT 213   ------------------------------------------------------------------------------------------------------------------ Chemistries   Recent Labs Lab 04/19/17 0542  04/21/17 0519  NA 145  < > 148*  K 3.1*  < > 3.7  CL 101  < > 101  CO2 32  < > 38*  GLUCOSE 205*  < > 126*  BUN 24*  < > 26*  CREATININE 1.86*  < > 1.84*  CALCIUM  8.6*  < > 8.4*  MG  --   < > 1.9  AST 13*  --   --   ALT 9*  --   --   ALKPHOS 98  --   --   BILITOT 1.0  --   --   < > = values in this interval not displayed. ------------------------------------------------------------------------------------------------------------------  Cardiac Enzymes  Recent Labs Lab 04/19/17 0542  TROPONINI 0.03*   ------------------------------------------------------------------------------------------------------------------  RADIOLOGY:  No results found.   ASSESSMENT AND PLAN:   * Acute on chronic hypoxic respiratory failure Due to congestive heart failure. Continue BiPAP support. High risk for intubation. Patient is full code. Appreciate pulmonary help in the ICU.  * Acute on chronic diastolic congestive heart failure - IV Lasix, Beta blockers - Input and Output - Monitor Bun/Cr and Potassium - Echo- reviewed Continue diuresis  * Hypertension.  Metoprolol and clonidine  * Insulin-dependent diabetes mellitus. ON lantus. Sliding scale insulin.  * CKD stage III is stable.  Mild increase in creatinine today  * DVT prophylaxis with Lovenox  Patient is critically ill.  All the records are reviewed and case discussed with Care Management/Social Worker Management plans discussed with the patient, family and they are in agreement.  CODE STATUS: FULL CODE  DVT Prophylaxis: SCDs  TOTAL TIME TAKING CARE OF THIS PATIENT: 30 minutes.   Hillary Bow R M.D on 04/21/2017 at 8:35 AM  Between 7am to 6pm - Pager - [phone]  After 6pm  go to [email or web address] - password [email or web address] [facility]  Office  [phone]  CC: Primary care physician; Hermelinda Dellen, DO  Note: This dictation was prepared with Dragon dictation along with smaller phrase technology. Any transcriptional errors that result from this process are unintentional.

## 2017-04-21 NOTE — Care Management (Addendum)
RNCM met with patient that was sitting up in bed; eyes open but did not talk during my visit, her son did talking. Per son patient has used home health PT in the past but he doesn't recall name of agency. She is on chronic O2 through [facility] however "she needs a tank because current tank is not meeting her liter flow requirements".  I have notified Brad with [facility] to investigate this need. Son states that he doubts patient will agree to SNF if needed. Patient stays with son and per son she is able to walk with a walker.  PT requested from Dr. Mortimer Fries- order obtained. RNCM will follow.

## 2017-04-21 NOTE — Consult Note (Signed)
[facility] Consultation       Name: Veronica Anderson MRN: [id number] DOB: April 24, 1946    ADMISSION DATE:  04/19/2017 CONSULTATION DATE:  04/19/2017  REFERRING MD :  Hospitalist  CHIEF COMPLAINT:  Shortness of Breath   HISTORY OF PRESENT ILLNESS:  Veronica Anderson is a 71 year old African-American female with a past medical history remarkable for hypertension, diabetes, hyperlipidemia, peripheral neuopathy, HFpEF ecurrent, underlying history of CVA, morbid obesity with obstructive sleep apnea, renal insufficiency developed progressive increasing shortness of breath, occurring over the past several days. She states that her allergies began acting up last week. EMS was called and she was brought into the emergency department where she was noted to be in acute on chronic hypercapnic respiratory failure, was started on noninvasive ventilation, given IV Lasix. Chest x-ray revealed congestive heart failure, elevated BNP at 370, troponin at 0.03, B24, creatinine 1.86. Leukocytosis at 16 in hemoglobin at 7.9.    Patient remains SOB On biPAP Wean as tolerated   REVIEW OF SYSTEMS:    The remainder of systems were reviewed and were found to be negative other than what is documented in the HPI.    VITAL SIGNS: Temp:  [98.1 F (36.7 C)-98.7 F (37.1 C)] 98.6 F (37 C) (10/09 0815) Pulse Rate:  [54-75] 62 (10/09 0815) Resp:  [12-21] 17 (10/09 0815) BP: (149-182)/(53-137) 168/53 (10/09 0815) SpO2:  [89 %-100 %] 100 % (10/09 0815) FiO2 (%):  [50 %-65 %] 65 % (10/09 0818) Weight:  [315 lb 4.1 oz (143 kg)] 315 lb 4.1 oz (143 kg) (10/09 0500) HEMODYNAMICS:   VENTILATOR SETTINGS: Presently on BiPAP  Physical Examination:   VS: BP (!) 168/53 (BP Location: Right Wrist)   Pulse 62   Temp 98.6 F (37 C) (Oral)   Resp 17   Ht 5\' 5"  (1.651 m)   Wt (!) 315 lb 4.1 oz (143 kg)   SpO2 100%   BMI 52.46 kg/m   General Appearance: Patient in mild respiratory distress on BiPAP  mild accessory muscle utilization Neuro:without focal findings, mental status, speech normal,. HEENT: PERRLA, EOM intact, no ptosis, no other lesions noticed;  Pulmonary: Bilateral rales appreciated posteriorly Cardiovascular Normal Y1,V4. 2/6 systolic murmur left parasternal border   Abdomen: Benign, Soft, non-tender, No masses, hepatosplenomegaly, No lymphadenopathy Endoc: No evident thyromegaly, no signs of acromegaly. Skin:   warm, no rashes, no ecchymosis  Extremities: normal, no cyanosis, clubbing, wrinkly skin,recently diuresed changes  Consistent with chronic venous stasis   LABS: Reviewed   LABORATORY PANEL:   CBC  Recent Labs Lab 04/21/17 0519  WBC 6.0  HGB 7.6*  HCT 25.0*  PLT 213    Chemistries   Recent Labs Lab 04/19/17 0542  04/19/17 2014  04/21/17 0519  NA 145  --  148*  < > 148*  K 3.1*  --  3.3*  < > 3.7  CL 101  --  101  < > 101  CO2 32  --  35*  < > 38*  GLUCOSE 205*  --  158*  < > 126*  BUN 24*  --  23*  < > 26*  CREATININE 1.86*  --  1.74*  < > 1.84*  CALCIUM 8.6*  --  8.5*  < > 8.4*  MG  --   < > 1.9  --  1.9  PHOS  --   --  3.9  --   --   AST 13*  --   --   --   --  ALT 9*  --   --   --   --   ALKPHOS 98  --   --   --   --   BILITOT 1.0  --   --   --   --   < > = values in this interval not displayed.   Recent Labs Lab 04/19/17 2112 04/20/17 0725 04/20/17 1123 04/20/17 1602 04/20/17 2221 04/21/17 0738  GLUCAP 135* 87 113* 143* 161* 105*    Recent Labs Lab 04/19/17 0542 04/19/17 2030  PHART 7.28* 7.34*  PCO2ART 82* 76*  PO2ART 202* 71*      ASSESSMENT/PLAN   Congestive heart failure. HFpEF, we'll continue noninvasive ventilation, diuresis as tolerated,blood pressure control, serial EKG, echocardiagram. Patient with elevated BNP, troponin at 0.03, EKG consistent with left axis deviation. Wean biPAP as tolerated  Acute on chronic hypercapnic respiratory failure. Patient is on home CPAP, suspect obesity hypoventilation  syndrome  Hypokalemia. We will replace  Leukocytosis No clinical evidence of infet this time will follow closely  Anemia. Stable hemodynamics, no evidence of active bleeding    Corrin Parker, M.D.  [facility]  Medical Director [place] Director [facility] Cardio-P[facility] Department

## 2017-04-21 NOTE — Evaluation (Signed)
Physical Therapy Evaluation Patient Details Name: Veronica Anderson MRN: [id number] DOB: 11-03-1945 Today's Date: 04/21/2017   History of Present Illness  71 y.o. female with a known history of Chronic diastolic congestive heart failure, hypertension, diabetes, CK D stage III, morbid obesity presents to the hospital brought in through EMS after family noticed patient has been progressively worsening with the breathing status over the last few days.  Clinical Impression  Pt's O2 in the low 90s on HFNC, did not drop significantly with low intensity bed exercises (~10 minutes apart from the PT exam); pt did not feel ready to try getting to sitting, standing, etc.  Son present and reports that this situation happens a time or 2 a year and pt recovers and is able to go home and do her normal in-home activity with [facility], family assist and home health nurse.  Pt has had HHPT in the past and despite her very limited activity today she and family indicate that going home will almost assuredly be the plan.  Overall pt showed good effort with light bed exercises, but refused to push herself any further regarding mobility.    Follow Up Recommendations SNF (seems unlikely family/pt will agree, HHPT more likely)    Equipment Recommendations       Recommendations for Other Services       Precautions / Restrictions Precautions Precautions: Fall Restrictions Weight Bearing Restrictions: No      Mobility  Bed Mobility               General bed mobility comments: pt deferred, states she is not ready for that today, "maybe tomorrow we can do that"  Transfers                    Ambulation/Gait                Stairs            Wheelchair Mobility    Modified Rankin (Stroke Patients Only)       Balance                                             Pertinent Vitals/Pain Pain Assessment:  (unrated, c/o general soreness)    Home Living Family/patient  expects to be discharged to:: Private residence Living Arrangements: Children Available Help at Discharge: Family;Available PRN/intermittently Type of Home: House Home Access:  (note indicates that there is a ramp son did not mention this)   Entrance Stairs-Number of Steps: 2   Home Equipment: Cane - single point;Bedside commode      Prior Function Level of Independence: Needs assistance   Gait / Transfers Assistance Needed: Performed with SPC for household distances     Comments: Pt only out of the home for MD appointments     Hand Dominance        Extremity/Trunk Assessment   Upper Extremity Assessment Upper Extremity Assessment: Generalized weakness (limited AROM, minimal shoulder elevation)    Lower Extremity Assessment Lower Extremity Assessment: Generalized weakness (limited AROM bilaterally secondary to habitus, grossly 3/5)       Communication   Communication:  (Pt with minimal interaction, most history gathered from son)  Cognition Arousal/Alertness: Lethargic Behavior During Therapy: WFL for tasks assessed/performed Overall Cognitive Status: Within Functional Limits for tasks assessed  General Comments: Pt generally keeping eyes closed t/o session, though able to particiapte with exercises and answer some questions      General Comments      Exercises General Exercises - Lower Extremity Ankle Circles/Pumps: AROM;10 reps Short Arc Quad: AROM;Strengthening;10 reps Heel Slides: AAROM;AROM;10 reps Hip ABduction/ADduction: AROM;10 reps   Assessment/Plan    PT Assessment Patient needs continued PT services  PT Problem List Decreased strength;Decreased range of motion;Decreased activity tolerance;Decreased balance;Decreased mobility;Decreased coordination;Decreased cognition;Decreased knowledge of use of DME;Decreased safety awareness;Cardiopulmonary status limiting activity;Pain;Obesity       PT Treatment  Interventions DME instruction;Gait training;Stair training;Therapeutic activities;Functional mobility training;Therapeutic exercise;Balance training;Neuromuscular re-education;Cognitive remediation;Patient/family education    PT Goals (Current goals can be found in the Care Plan section)  Acute Rehab PT Goals Patient Stated Goal: go home PT Goal Formulation: With patient/family Time For Goal Achievement: 05/05/17 Potential to Achieve Goals: Fair    Frequency Min 2X/week   Barriers to discharge        Co-evaluation               AM-PAC PT "6 Clicks" Daily Activity  Outcome Measure Difficulty turning over in bed (including adjusting bedclothes, sheets and blankets)?: Unable Difficulty moving from lying on back to sitting on the side of the bed? : Unable Difficulty sitting down on and standing up from a chair with arms (e.g., wheelchair, bedside commode, etc,.)?: Unable Help needed moving to and from a bed to chair (including a wheelchair)?: Total Help needed walking in hospital room?: Total Help needed climbing 3-5 steps with a railing? : Total 6 Click Score: 6    End of Session Equipment Utilized During Treatment: Oxygen (pt on HFNC) Activity Tolerance: Patient limited by fatigue;Patient limited by lethargy Patient left: with bed alarm set;with call bell/phone within reach;with family/visitor present   PT Visit Diagnosis: Muscle weakness (generalized) (M62.81);Difficulty in walking, not elsewhere classified (R26.2)    Time: 6387-5643 PT Time Calculation (min) (ACUTE ONLY): 21 min   Charges:   PT Evaluation $PT Eval Low Complexity: 1 Low PT Treatments $Therapeutic Exercise: 8-22 mins   PT G Codes:   PT G-Codes **NOT FOR INPATIENT CLASS** Functional Assessment Tool Used: AM-PAC 6 Clicks Basic Mobility Functional Limitation: Mobility: Walking and moving around Mobility: Walking and Moving Around Current Status (P2951): 100 percent impaired, limited or  restricted Mobility: Walking and Moving Around Goal Status (O8416): At least 60 percent but less than 80 percent impaired, limited or restricted    Kreg Shropshire, DPT 04/21/2017, 4:53 PM

## 2017-04-22 LAB — BASIC METABOLIC PANEL
Anion gap: 9 (ref 5–15)
BUN: 29 mg/dL — AB (ref 6–20)
CO2: 41 mmol/L — AB (ref 22–32)
CREATININE: 1.77 mg/dL — AB (ref 0.44–1.00)
Calcium: 8.3 mg/dL — ABNORMAL LOW (ref 8.9–10.3)
Chloride: 98 mmol/L — ABNORMAL LOW (ref 101–111)
GFR, EST AFRICAN AMERICAN: 32 mL/min — AB (ref >60)
GFR, EST NON AFRICAN AMERICAN: 28 mL/min — AB (ref >60)
Glucose, Bld: 109 mg/dL — ABNORMAL HIGH (ref 65–99)
Potassium: 3.6 mmol/L (ref 3.5–5.1)
SODIUM: 148 mmol/L — AB (ref 135–145)

## 2017-04-22 LAB — GLUCOSE, CAPILLARY
Glucose-Capillary: 109 mg/dL — ABNORMAL HIGH (ref 65–99)
Glucose-Capillary: 94 mg/dL (ref 65–99)
Glucose-Capillary: 134 mg/dL — ABNORMAL HIGH (ref 65–99)
Glucose-Capillary: 175 mg/dL — ABNORMAL HIGH (ref 65–99)

## 2017-04-22 LAB — MAGNESIUM: MAGNESIUM: 1.7 mg/dL (ref 1.7–2.4)

## 2017-04-22 MED ORDER — IPRATROPIUM-ALBUTEROL 0.5-2.5 (3) MG/3ML IN SOLN
3.0000 mL | Freq: Four times a day (QID) | RESPIRATORY_TRACT | Status: DC
  Administered 2017-04-22 – 2017-05-02 (×39): 3 mL via RESPIRATORY_TRACT
  Filled 2017-04-22 (×40): qty 3

## 2017-04-22 MED ORDER — MAGNESIUM SULFATE 2 GM/50ML IV SOLN
2.0000 g | Freq: Once | INTRAVENOUS | Status: AC
  Administered 2017-04-22: 2 g via INTRAVENOUS
  Filled 2017-04-22: qty 50

## 2017-04-22 MED ORDER — ACETAZOLAMIDE SODIUM 500 MG IJ SOLR
500.0000 mg | Freq: Once | INTRAMUSCULAR | Status: AC
  Administered 2017-04-22: 500 mg via INTRAVENOUS
  Filled 2017-04-22: qty 500

## 2017-04-22 MED ORDER — STERILE WATER FOR INJECTION IJ SOLN
INTRAMUSCULAR | Status: AC
  Administered 2017-04-22: 10 mL
  Filled 2017-04-22: qty 10

## 2017-04-22 NOTE — Progress Notes (Signed)
MEDICATION RELATED CONSULT NOTE - INITIAL   Pharmacy Consult for electrolyte management   Pharmacy consulted for electrolyte management for 71 yo female admitted with CHF exacerbation. Patient transitioned from furosemide 60mg  IV Q8hr to acetazolamide 500mg  IV x 1 on 10/10.    Plan:  No replacement warranted at this time. Will check electrolytes with am labs.   Allergies  Allergen Reactions  . Ace Inhibitors Cough    Patient Measurements: Height: 5\' 5"  (165.1 cm) Weight: (!) 309 lb 15.5 oz (140.6 kg) IBW/kg (Calculated) : 57   Vital Signs: Temp: 97.7 F (36.5 C) (10/10 1953) Temp Source: Axillary (10/10 1953) BP: 161/36 (10/10 2200) Pulse Rate: 73 (10/10 2200) Intake/Output from previous day: 10/09 0701 - 10/10 0700 In: -  Out: 1100 [Urine:1100] Intake/Output from this shift: No intake/output data recorded.  Labs:  Recent Labs  04/20/17 0441 04/21/17 0519 04/22/17 0539  WBC 10.7 6.0  --   HGB 7.2* 7.6*  --   HCT 23.1* 25.0*  --   PLT 235 213  --   CREATININE 1.55* 1.84* 1.77*  MG  --  1.9 1.7   Estimated Creatinine Clearance: 41.6 mL/min (A) (by C-G formula based on SCr of 1.77 mg/dL (H)).   Medical History: Past Medical History:  Diagnosis Date  . Allergy   . Anemia   . Asthma   . Cataract    bilateral  . CHF (NYHA class II, ACC/AHA stage C) ([place])   . Chronic kidney disease    stage IV (severe), Dr. Aleene Davidson  . Constipation   . Diabetes mellitus without complication ([place])   . Edema    feet/ankles  . GERD (gastroesophageal reflux disease)   . Hyperlipidemia   . Hypertension   . Left ankle pain   . Lumbago   . Myocardial infarction ([place])   . Neuropathy   . Neuropathy   . OA (osteoarthritis)   . Obesity   . Obstructive sleep apnea syndrome    CPAP  . Paresthesia    Foot  . Protein calorie malnutrition ([place])   . Requires supplemental oxygen   . Stroke ([place])   . Vitamin D deficiency     Pharmacy will continue to monitor and adjust per  consult.   Jedi Catalfamo L 04/22/2017,10:23 PM

## 2017-04-22 NOTE — Progress Notes (Signed)
[place] at [place] NAME: Veronica Anderson    MR#:  [id number]  DATE OF BIRTH:  Sep 22, 1945  SUBJECTIVE:  CHIEF COMPLAINT:   Chief Complaint  Patient presents with  . Respiratory Distress   - Patient using BiPAP and 9/10, was on high flow during the day yesterday. -Son at bedside. Breathing is improving  REVIEW OF SYSTEMS:  Review of Systems  Constitutional: Positive for malaise/fatigue. Negative for chills and fever.  HENT: Negative for ear discharge, hearing loss and nosebleeds.   Respiratory: Positive for shortness of breath. Negative for cough and wheezing.   Cardiovascular: Negative for chest pain and palpitations.  Gastrointestinal: Negative for abdominal pain, constipation, diarrhea, nausea and vomiting.  Genitourinary: Negative for dysuria.  Musculoskeletal: Negative for myalgias and neck pain.  Neurological: Negative for dizziness, sensory change, speech change, focal weakness, seizures and headaches.  Psychiatric/Behavioral: Negative for depression.    DRUG ALLERGIES:   Allergies  Allergen Reactions  . Ace Inhibitors Cough    VITALS:  Blood pressure (!) 150/49, pulse 69, temperature 97.8 F (36.6 C), temperature source Oral, resp. rate 20, height 5\' 5"  (1.651 m), weight (!) 140.6 kg (309 lb 15.5 oz), SpO2 93 %.  PHYSICAL EXAMINATION:  Physical Exam  GENERAL:  71 y.o.-year-old obese patient lying in the bed with no acute distress.  EYES: Pupils equal, round, reactive to light and accommodation. No scleral icterus. Extraocular muscles intact.  HEENT: Head atraumatic, normocephalic. Oropharynx and nasopharynx clear.  NECK:  Supple, no jugular venous distention. No thyroid enlargement, no tenderness.  LUNGS: Normal breath sounds bilaterally, no wheezing, rales,rhonchi or crepitation. No use of accessory muscles of respiration.  CARDIOVASCULAR: S1, S2 normal. No murmurs, rubs, or gallops.  ABDOMEN: Soft, nontender,  nondistended. Bowel sounds present. No organomegaly or mass.  EXTREMITIES: No pedal edema, cyanosis, or clubbing.  NEUROLOGIC: Cranial nerves II through XII are intact. Muscle strength 5/5 in all extremities. Sensation intact. Gait not checked.  PSYCHIATRIC: The patient is alert and oriented x 2-3.  SKIN: No obvious rash, lesion, or ulcer.    LABORATORY PANEL:   CBC  Recent Labs Lab 04/21/17 0519  WBC 6.0  HGB 7.6*  HCT 25.0*  PLT 213   ------------------------------------------------------------------------------------------------------------------  Chemistries   Recent Labs Lab 04/19/17 0542  04/22/17 0539  NA 145  < > 148*  K 3.1*  < > 3.6  CL 101  < > 98*  CO2 32  < > 41*  GLUCOSE 205*  < > 109*  BUN 24*  < > 29*  CREATININE 1.86*  < > 1.77*  CALCIUM 8.6*  < > 8.3*  MG  --   < > 1.7  AST 13*  --   --   ALT 9*  --   --   ALKPHOS 98  --   --   BILITOT 1.0  --   --   < > = values in this interval not displayed. ------------------------------------------------------------------------------------------------------------------  Cardiac Enzymes  Recent Labs Lab 04/19/17 0542  TROPONINI 0.03*   ------------------------------------------------------------------------------------------------------------------  RADIOLOGY:  No results found.  EKG:   Orders placed or performed during the hospital encounter of 04/19/17  . ED EKG  . ED EKG  . EKG 12-Lead  . EKG 12-Lead  . EKG    ASSESSMENT AND PLAN:   71 year old female, morbidly obese with past medical history significant for chronic diastolic heart failure, hypertension, diabetes and CK D stage III presents to hospital secondary to  worsening shortness of breath.  * Acute on chronic hypoxic respiratory failure - Continue to acute on chronic diastolic CHF exacerbation. -Uses CPAP at home during nighttime, here continue BiPAP support for night sweats, -Using high flow during the day yesterday, goal to wean  her off to nasal cannula today. At home she is only on nighttime oxygen. -Continue IV Lasix. Currently on 60 mg IV every 8 hours -Monitor input and output - Echocardiogram with diastolic dysfunction  * Hypertension.  Metoprolol and clonidine Also on hydralazine prn  * Insulin-dependent diabetes mellitus. ON lantus. Sliding scale insulin.  * CKD stage III is stable.  Monitor while on diuretics  * DVT prophylaxis with Lovenox  Son at bedside. Physical therapy recommended rehabilitation-son is hopeful the patient will get stronger to be discharged home whenever she is ready     All the records are reviewed and case discussed with Care Management/Social Workerr. Management plans discussed with the patient, family and they are in agreement.  CODE STATUS: Full Code  TOTAL TIME TAKING CARE OF THIS PATIENT: 37 minutes.   POSSIBLE D/C IN 2-3 DAYS, DEPENDING ON CLINICAL CONDITION.   Veronica Anderson M.D on 04/22/2017 at 9:33 AM  Between 7am to 6pm - Pager - [phone]  After 6pm go to [email or web address] - password [email or web address] [facility]  Office  [phone]  CC: Primary care physician; Veronica Dellen, DO

## 2017-04-22 NOTE — Progress Notes (Signed)
Pt rested overnight with no complaints of pain. Son at bedside overnight.

## 2017-04-22 NOTE — Plan of Care (Signed)
Problem: Physical Regulation: Goal: Ability to maintain clinical measurements within normal limits will improve Outcome: Progressing Patient transitioned off of Bipap this morning, maintaining sats within set range on 4l [place]  Problem: Activity: Goal: Risk for activity intolerance will decrease Outcome: Progressing Dyspnea with exertion still present, however patient more alert and talkative- interacting with family and nurse  Comments: Patient progressing; adjustments made to meds by MD; patient turned and repositioned as tolerated; patient refused bath this morning- external catheter in place for incontinence. Appetite increasing as the day progresses

## 2017-04-23 ENCOUNTER — Inpatient Hospital Stay: Payer: Medicare Other

## 2017-04-23 LAB — BASIC METABOLIC PANEL
Anion gap: 14 (ref 5–15)
BUN: 29 mg/dL — AB (ref 6–20)
CHLORIDE: 99 mmol/L — AB (ref 101–111)
CO2: 36 mmol/L — AB (ref 22–32)
CREATININE: 1.68 mg/dL — AB (ref 0.44–1.00)
Calcium: 8.5 mg/dL — ABNORMAL LOW (ref 8.9–10.3)
GFR calc Af Amer: 34 mL/min — ABNORMAL LOW (ref >60)
GFR calc non Af Amer: 30 mL/min — ABNORMAL LOW (ref >60)
GLUCOSE: 134 mg/dL — AB (ref 65–99)
Potassium: 4.5 mmol/L (ref 3.5–5.1)
Sodium: 149 mmol/L — ABNORMAL HIGH (ref 135–145)

## 2017-04-23 LAB — BLOOD GAS, ARTERIAL
ACID-BASE EXCESS: 20.3 mmol/L — AB (ref 0.0–2.0)
ACID-BASE EXCESS: 23.2 mmol/L — AB (ref 0.0–2.0)
Acid-Base Excess: 26.5 mmol/L — ABNORMAL HIGH (ref 0.0–2.0)
Acid-Base Excess: 24.5 mmol/L — ABNORMAL HIGH (ref 0.0–2.0)
BICARBONATE: 55.4 mmol/L — AB (ref 20.0–28.0)
Bicarbonate: 52 mmol/L — ABNORMAL HIGH (ref 20.0–28.0)
Bicarbonate: 55.2 mmol/L — ABNORMAL HIGH (ref 20.0–28.0)
Bicarbonate: 51.3 mmol/L — ABNORMAL HIGH (ref 20.0–28.0)
DELIVERY SYSTEMS: POSITIVE
Delivery systems: POSITIVE
Expiratory PAP: 6
Expiratory PAP: 15
Expiratory PAP: 15
FIO2: 60
FIO2: 0.4
FIO2: 40
FIO2: 0.4
INSPIRATORY PAP: 25
Inspiratory PAP: 16
Inspiratory PAP: 25
MECHANICAL RATE: 15
O2 Saturation: 86.8 %
O2 Saturation: 87.8 %
O2 Saturation: 94.7 %
O2 Saturation: 98.8 %
PATIENT TEMPERATURE: 37
PCO2 ART: 101 mmHg — AB (ref 32.0–48.0)
PEEP/CPAP: 10 cmH2O
PH ART: 7.32 — AB (ref 7.350–7.450)
PH ART: 7.33 — AB (ref 7.350–7.450)
PO2 ART: 57 mmHg — AB (ref 83.0–108.0)
PO2 ART: 57 mmHg — AB (ref 83.0–108.0)
Patient temperature: 37
Patient temperature: 37
Patient temperature: 37
VT: 500 mL
pCO2 arterial: 100 mmHg (ref 32.0–48.0)
pCO2 arterial: 105 mmHg (ref 32.0–48.0)
pCO2 arterial: 60 mmHg — ABNORMAL HIGH (ref 32.0–48.0)
pH, Arterial: 7.35 (ref 7.350–7.450)
pH, Arterial: 7.54 — ABNORMAL HIGH (ref 7.350–7.450)
pO2, Arterial: 79 mmHg — ABNORMAL LOW (ref 83.0–108.0)
pO2, Arterial: 109 mmHg — ABNORMAL HIGH (ref 83.0–108.0)

## 2017-04-23 LAB — MAGNESIUM: Magnesium: 2.2 mg/dL (ref 1.7–2.4)

## 2017-04-23 LAB — GLUCOSE, CAPILLARY
GLUCOSE-CAPILLARY: 115 mg/dL — AB (ref 65–99)
GLUCOSE-CAPILLARY: 113 mg/dL — AB (ref 65–99)
GLUCOSE-CAPILLARY: 119 mg/dL — AB (ref 65–99)
Glucose-Capillary: 129 mg/dL — ABNORMAL HIGH (ref 65–99)

## 2017-04-23 MED ORDER — STERILE WATER FOR INJECTION IJ SOLN
INTRAMUSCULAR | Status: AC
  Administered 2017-04-23: 19:00:00
  Filled 2017-04-23: qty 10

## 2017-04-23 MED ORDER — FENTANYL CITRATE (PF) 100 MCG/2ML IJ SOLN
50.0000 ug | Freq: Once | INTRAMUSCULAR | Status: AC
  Administered 2017-04-23: 50 ug via INTRAVENOUS

## 2017-04-23 MED ORDER — MIDAZOLAM HCL 2 MG/2ML IJ SOLN
INTRAMUSCULAR | Status: AC
  Administered 2017-04-23: 19:00:00
  Filled 2017-04-23: qty 4

## 2017-04-23 MED ORDER — FENTANYL CITRATE (PF) 100 MCG/2ML IJ SOLN
INTRAMUSCULAR | Status: AC
  Administered 2017-04-23: 19:00:00
  Filled 2017-04-23: qty 4

## 2017-04-23 MED ORDER — CHLORHEXIDINE GLUCONATE 0.12% ORAL RINSE (MEDLINE KIT)
15.0000 mL | Freq: Two times a day (BID) | OROMUCOSAL | Status: DC
  Administered 2017-04-23 – 2017-04-25 (×4): 15 mL via OROMUCOSAL

## 2017-04-23 MED ORDER — ACETAZOLAMIDE SODIUM 500 MG IJ SOLR
500.0000 mg | Freq: Once | INTRAMUSCULAR | Status: AC
  Administered 2017-04-23: 500 mg via INTRAVENOUS
  Filled 2017-04-23: qty 500

## 2017-04-23 MED ORDER — FENTANYL BOLUS VIA INFUSION
25.0000 ug | INTRAVENOUS | Status: DC | PRN
  Filled 2017-04-23: qty 25

## 2017-04-23 MED ORDER — VECURONIUM BROMIDE 10 MG IV SOLR
INTRAVENOUS | Status: AC
  Administered 2017-04-23: 19:00:00
  Filled 2017-04-23: qty 10

## 2017-04-23 MED ORDER — FENTANYL 2500MCG IN NS 250ML (10MCG/ML) PREMIX INFUSION
25.0000 ug/h | INTRAVENOUS | Status: DC
  Administered 2017-04-23: 50 ug/h via INTRAVENOUS
  Administered 2017-04-24 (×2): 200 ug/h via INTRAVENOUS
  Administered 2017-04-27: 50 ug/h via INTRAVENOUS
  Filled 2017-04-23 (×4): qty 250

## 2017-04-23 MED ORDER — MIDAZOLAM HCL 2 MG/2ML IJ SOLN
1.0000 mg | INTRAMUSCULAR | Status: DC | PRN
  Administered 2017-04-24 (×2): 1 mg via INTRAVENOUS
  Filled 2017-04-23: qty 2

## 2017-04-23 MED ORDER — FENTANYL 2500MCG IN NS 250ML (10MCG/ML) PREMIX INFUSION
INTRAVENOUS | Status: AC
  Administered 2017-04-23: 50 ug/h via INTRAVENOUS
  Filled 2017-04-23: qty 250

## 2017-04-23 MED ORDER — MIDAZOLAM HCL 2 MG/2ML IJ SOLN: 1.0000 mg | INTRAMUSCULAR | Status: DC | PRN

## 2017-04-23 MED ORDER — ORAL CARE MOUTH RINSE
15.0000 mL | Freq: Four times a day (QID) | OROMUCOSAL | Status: DC
Start: 2017-04-24 — End: 2017-04-25
  Administered 2017-04-24 – 2017-04-25 (×5): 15 mL via OROMUCOSAL

## 2017-04-23 MED ORDER — NOREPINEPHRINE BITARTRATE 1 MG/ML IV SOLN
0.0000 ug/min | INTRAVENOUS | Status: DC
  Administered 2017-04-25: 5 ug/min via INTRAVENOUS
  Filled 2017-04-23 (×3): qty 4

## 2017-04-23 MED ORDER — MIDAZOLAM HCL 2 MG/2ML IJ SOLN
INTRAMUSCULAR | Status: AC
  Filled 2017-04-23: qty 2

## 2017-04-23 MED ORDER — MIDAZOLAM HCL 2 MG/2ML IJ SOLN
2.0000 mg | Freq: Once | INTRAMUSCULAR | Status: AC
  Administered 2017-04-23: 2 mg via INTRAVENOUS

## 2017-04-23 MED ORDER — ONDANSETRON HCL 4 MG/2ML IJ SOLN
4.0000 mg | Freq: Four times a day (QID) | INTRAMUSCULAR | Status: DC | PRN
  Administered 2017-04-23: 4 mg via INTRAVENOUS

## 2017-04-23 MED ORDER — FUROSEMIDE 10 MG/ML IJ SOLN
60.0000 mg | Freq: Three times a day (TID) | INTRAMUSCULAR | Status: DC
  Administered 2017-04-23 – 2017-04-24 (×4): 60 mg via INTRAVENOUS
  Filled 2017-04-23 (×4): qty 6

## 2017-04-23 NOTE — Progress Notes (Signed)
[place] at [place] NAME: Veronica Anderson    MR#:  [id number]  DATE OF BIRTH:  01/15/1946  SUBJECTIVE:  CHIEF COMPLAINT:   Chief Complaint  Patient presents with  . Respiratory Distress   - Patient using BiPAP at nights and on 4L nasal cannula during day - feeling much better today - transfer to floor today  REVIEW OF SYSTEMS:  Review of Systems  Constitutional: Positive for malaise/fatigue. Negative for chills and fever.  HENT: Negative for ear discharge, hearing loss and nosebleeds.   Respiratory: Positive for shortness of breath. Negative for cough and wheezing.   Cardiovascular: Negative for chest pain and palpitations.  Gastrointestinal: Negative for abdominal pain, constipation, diarrhea, nausea and vomiting.  Genitourinary: Negative for dysuria.  Musculoskeletal: Negative for myalgias and neck pain.  Neurological: Negative for dizziness, sensory change, speech change, focal weakness, seizures and headaches.  Psychiatric/Behavioral: Negative for depression.    DRUG ALLERGIES:   Allergies  Allergen Reactions  . Ace Inhibitors Cough    VITALS:  Blood pressure (!) 173/60, pulse 63, temperature 98.3 F (36.8 C), temperature source Axillary, resp. rate 20, height 5\' 5"  (1.651 m), weight (!) 140.2 kg (309 lb 1.4 oz), SpO2 95 %.  PHYSICAL EXAMINATION:  Physical Exam  GENERAL:  71 y.o.-year-old obese patient lying in the bed with no acute distress.  EYES: Pupils equal, round, reactive to light and accommodation. No scleral icterus. Extraocular muscles intact.  HEENT: Head atraumatic, normocephalic. Oropharynx and nasopharynx clear.  NECK:  Supple, no jugular venous distention. No thyroid enlargement, no tenderness.  LUNGS: Normal breath sounds bilaterally, no wheezing, rales,rhonchi or crepitation. No use of accessory muscles of respiration. Decreased at the bases CARDIOVASCULAR: S1, S2 normal. No murmurs, rubs, or gallops.    ABDOMEN: Soft, nontender, nondistended. Bowel sounds present. No organomegaly or mass.  EXTREMITIES: No pedal edema, cyanosis, or clubbing.  NEUROLOGIC: Cranial nerves II through XII are intact. Muscle strength 5/5 in all extremities. Sensation intact. Gait not checked.  PSYCHIATRIC: The patient is alert and oriented x 3.  SKIN: No obvious rash, lesion, or ulcer.    LABORATORY PANEL:   CBC  Recent Labs Lab 04/21/17 0519  WBC 6.0  HGB 7.6*  HCT 25.0*  PLT 213   ------------------------------------------------------------------------------------------------------------------  Chemistries   Recent Labs Lab 04/19/17 0542  04/22/17 0539  NA 145  < > 148*  K 3.1*  < > 3.6  CL 101  < > 98*  CO2 32  < > 41*  GLUCOSE 205*  < > 109*  BUN 24*  < > 29*  CREATININE 1.86*  < > 1.77*  CALCIUM 8.6*  < > 8.3*  MG  --   < > 1.7  AST 13*  --   --   ALT 9*  --   --   ALKPHOS 98  --   --   BILITOT 1.0  --   --   < > = values in this interval not displayed. ------------------------------------------------------------------------------------------------------------------  Cardiac Enzymes  Recent Labs Lab 04/19/17 0542  TROPONINI 0.03*   ------------------------------------------------------------------------------------------------------------------  RADIOLOGY:  No results found.  EKG:   Orders placed or performed during the hospital encounter of 04/19/17  . ED EKG  . ED EKG  . EKG 12-Lead  . EKG 12-Lead  . EKG    ASSESSMENT AND PLAN:   71 year old female, morbidly obese with past medical history significant for chronic diastolic heart failure, hypertension, diabetes and CK D  stage III presents to hospital secondary to worsening shortness of breath.  * Acute on chronic hypoxic respiratory failure - Continue to acute on chronic diastolic CHF exacerbation. -Uses CPAP at home during nighttime, here continue BiPAP support at nights, -off high flow during the day and  wean to nasal cannula today. At home she is on 2L oxygen. -Continue IV Lasix. Currently on 60 mg IV every 8 hours- change to oral tomorrow -Monitor input and output - Echocardiogram with diastolic dysfunction  * Hypertension.  Metoprolol and clonidine Also on hydralazine prn  * Insulin-dependent diabetes mellitus. ON lantus. Sliding scale insulin.  * CKD stage III is stable.  Monitor while on diuretics  * DVT prophylaxis with Lovenox  Son at bedside. Physical therapy recommended rehabilitation-son is hopeful the patient will get stronger to be discharged home whenever she is ready  Can be transferred to floor today   All the records are reviewed and case discussed with Care Management/Social Workerr. Management plans discussed with the patient, family and they are in agreement.  CODE STATUS: Full Code  TOTAL TIME TAKING CARE OF THIS PATIENT: 36 minutes.   POSSIBLE D/C IN 2-3 DAYS, DEPENDING ON CLINICAL CONDITION.   Gladstone Lighter M.D on 04/23/2017 at 8:20 AM  Between 7am to 6pm - Pager - [phone]  After 6pm go to [email or web address] - password [email or web address] [facility]  Office  [phone]  CC: Primary care physician; Hermelinda Dellen, DO

## 2017-04-23 NOTE — Progress Notes (Signed)
Dr Mortimer Fries aware of ABG result 1 hr after bipap changes made. Abg with similar result.  pH 7.35/  pCo2  100/ pO2 57 / sat 88% . On Bipap 50%. 25/15.  Pt able to arouse easily but remains sleepy. Follows commands. No uop after diamox .Denies void urge. Bladder scan =158ml. Bun 29 Creat 1.68. co2 36 on recent bmp. Lasix 60mg  iv

## 2017-04-23 NOTE — Progress Notes (Addendum)
Becoming sleepier but arousable. Resp shallow on BIPAP. Suspect she is no longer compensating for high pco2. Dr Mortimer Fries notified and speaks with sons regarding intubation.  He is on his way.

## 2017-04-23 NOTE — Progress Notes (Signed)
ETT pulled back 2cm. ETT secured 21 cm at the lip.

## 2017-04-23 NOTE — Progress Notes (Signed)
No urine output. Small amount on underpads. Dr Mortimer Fries requests we try foley cath placement.  Question accuracy of bladder scan on morbidly obese pt. Foley cath insertion attempted x3 with 3 assist. Unable to visualize meatus. Pt needed break from insert attempts. Pure wick placed will attempt again later.

## 2017-04-23 NOTE — Progress Notes (Signed)
PT Hold Note  Patient Details Name: Veronica Anderson MRN: [id number] DOB: Sep 07, 1945   Hold Treatment:    Reason Eval/Treat Not Completed: Medical issues which prohibited therapy. Chart reviewed and RN consulted. Pt with decline in respiratory status and is now on Bipap. Concern for possible intubation. Not currently appropriate for PT treatment. Will continue to follow and perform treatment on later date/time when appropriate.  Lyndel Safe Janesha Brissette PT, DPT   Seanna Sisler 04/23/2017, 3:15 PM

## 2017-04-23 NOTE — Progress Notes (Signed)
Patient with failed biPAP and with CHF and CKD, patient was emergently intubated,sedated patients family initially requested transfer to [facility] but they have all consented and agreed to keep patient at [facility].   I have explained that there may not be a bed at [facility] at later date/time. The family has stated that patient would NOT want to be transferred to [facility].  At this time, patient will need CT chest to assess lung fields assess for thoracentesis and also Nephrology consult for HD and/or lasix infusion.  Patient remains full code.     Corrin Parker, M.D.  [facility]  Medical Director [place] Director [facility] Cardio-Pulmonary Department

## 2017-04-23 NOTE — Progress Notes (Addendum)
Pt was intubated easily by Dr Mortimer Fries. Fentanyl 100mg , versed 4mg  and vecoronium 10mg  given for intubation  Dr Mortimer Fries placed RIJ central line and OGT . X rays done. Fentanyl gtt started.Tolerated all procedures well. No pressors needed

## 2017-04-23 NOTE — Procedures (Signed)
Central Venous Catheter Placement: Indication: Patient receiving vesicant or irritant drug.; Patient receiving intravenous therapy for longer than 5 days.; Patient has limited or no vascular access.   Consent:emergent    Hand washing performed prior to starting the procedure.   Procedure:   An active timeout was performed and correct patient, name, & ID confirmed.   Patient was positioned correctly for central venous access.  Patient was prepped using strict sterile technique including chlorohexadine preps, sterile drape, sterile gown and sterile gloves.    The area was prepped, draped and anesthetized in the usual sterile manner. Patient comfort was obtained.    A triple lumen catheter was placed in RT  Internal Jugular Vein There was good blood return, catheter caps were placed on lumens, catheter flushed easily, the line was secured and a sterile dressing and BIO-PATCH applied.   Ultrasound was used to visualize vasculature and guidance of needle.   Number of Attempts: 1 Complications:none Estimated Blood Loss: none Chest Radiograph indicated and ordered.  Operator: Shannel Zahm.   Corrin Parker, M.D.  [facility]  Medical Director [place] Director [facility] Cardio-Pulmonary Department

## 2017-04-23 NOTE — Progress Notes (Signed)
MEDICATION RELATED CONSULT NOTE - INITIAL   Pharmacy Consult for electrolyte management   Pharmacy consulted for electrolyte management for 71 yo female admitted with CHF exacerbation.   Plan:  No replacement warranted at this time. Will check electrolytes with am labs.   Allergies  Allergen Reactions  . Ace Inhibitors Cough    Patient Measurements: Height: 5\' 5"  (165.1 cm) Weight: (!) 309 lb 1.4 oz (140.2 kg) IBW/kg (Calculated) : 57   Vital Signs: Temp: 98.3 F (36.8 C) (10/11 0800) Temp Source: Axillary (10/11 0800) BP: 159/49 (10/11 1000) Pulse Rate: 70 (10/11 1000) Intake/Output from previous day: 10/10 0701 - 10/11 0700 In: 570 [P.O.:550; I.V.:20] Out: 1800 [Urine:1800] Intake/Output from this shift: No intake/output data recorded.  Labs:  Recent Labs  04/21/17 0519 04/22/17 0539  WBC 6.0  --   HGB 7.6*  --   HCT 25.0*  --   PLT 213  --   CREATININE 1.84* 1.77*  MG 1.9 1.7   Estimated Creatinine Clearance: 41.6 mL/min (A) (by C-G formula based on SCr of 1.77 mg/dL (H)).   Medical History: Past Medical History:  Diagnosis Date  . Allergy   . Anemia   . Asthma   . Cataract    bilateral  . CHF (NYHA class II, ACC/AHA stage C) ([place])   . Chronic kidney disease    stage IV (severe), Dr. Aleene Davidson  . Constipation   . Diabetes mellitus without complication ([place])   . Edema    feet/ankles  . GERD (gastroesophageal reflux disease)   . Hyperlipidemia   . Hypertension   . Left ankle pain   . Lumbago   . Myocardial infarction ([place])   . Neuropathy   . Neuropathy   . OA (osteoarthritis)   . Obesity   . Obstructive sleep apnea syndrome    CPAP  . Paresthesia    Foot  . Protein calorie malnutrition ([place])   . Requires supplemental oxygen   . Stroke ([place])   . Vitamin D deficiency     Pharmacy will continue to monitor and adjust per consult.   Ulice Dash D 04/23/2017,11:33 AM

## 2017-04-23 NOTE — Progress Notes (Signed)
Worsening resp status, family at bedside updated. Patient biPAP settings increased, will await ABG results.

## 2017-04-23 NOTE — Procedures (Signed)
Endotracheal Intubation: Patient required placement of an artificial airway secondary to Respiratory Failure  Consent: Emergent.   Hand washing performed prior to starting the procedure.   Medications administered for sedation prior to procedure:  Midazolam 4 mg IV,  Vecuronium 10 mg IV, Fentanyl 100 mcg IV.    A time out procedure was called and correct patient, name, & ID confirmed. Needed supplies and equipment were assembled and checked to include ETT, 10 ml syringe, Glidescope, Mac and Miller blades, suction, oxygen and bag mask valve, end tidal CO2 monitor.   Patient was positioned to align the mouth and pharynx to facilitate visualization of the glottis.   Heart rate, SpO2 and blood pressure was continuously monitored during the procedure. Pre-oxygenation was conducted prior to intubation and endotracheal tube was placed through the vocal cords into the trachea.     The artificial airway was placed under direct visualization via glidescope route using a 8.0 ETT on the first attempt.  ETT was secured at 23 cm mark.  Placement was confirmed by auscuitation of lungs with good breath sounds bilaterally and no stomach sounds.  Condensation was noted on endotracheal tube.   Pulse ox 98%.  CO2 detector in place with appropriate color change.   Complications: None .   Operator: Day Deery.   Chest radiograph ordered and pending.   Comments: OGT placed via glidescope.  Veronica Anderson, M.D.  [facility]  Medical Director ICU-[facility]  Medical Director [facility] Cardio-Pulmonary Department       

## 2017-04-24 ENCOUNTER — Inpatient Hospital Stay: Payer: Medicare Other

## 2017-04-24 ENCOUNTER — Ambulatory Visit: Payer: Medicare Other | Admitting: Family Medicine

## 2017-04-24 DIAGNOSIS — J9601 Acute respiratory failure with hypoxia: Secondary | ICD-10-CM

## 2017-04-24 LAB — BLOOD GAS, VENOUS
Acid-Base Excess: 13.3 mmol/L — ABNORMAL HIGH (ref 0.0–2.0)
Bicarbonate: 38.3 mmol/L — ABNORMAL HIGH (ref 20.0–28.0)
LHR: 15 {breaths}/min
MECHANICAL RATE: 15
PEEP: 5 cmH2O
Patient temperature: 37
VT: 500 mL
pCO2, Ven: 48 mmHg (ref 44.0–60.0)
pH, Ven: 7.51 — ABNORMAL HIGH (ref 7.250–7.430)

## 2017-04-24 LAB — BASIC METABOLIC PANEL
ANION GAP: 12 (ref 5–15)
BUN: 30 mg/dL — AB (ref 6–20)
CALCIUM: 8.3 mg/dL — AB (ref 8.9–10.3)
CO2: 38 mmol/L — AB (ref 22–32)
Chloride: 99 mmol/L — ABNORMAL LOW (ref 101–111)
Creatinine, Ser: 1.65 mg/dL — ABNORMAL HIGH (ref 0.44–1.00)
GFR calc Af Amer: 35 mL/min — ABNORMAL LOW (ref >60)
GFR, EST NON AFRICAN AMERICAN: 30 mL/min — AB (ref >60)
GLUCOSE: 74 mg/dL (ref 65–99)
Potassium: 3.1 mmol/L — ABNORMAL LOW (ref 3.5–5.1)
Sodium: 149 mmol/L — ABNORMAL HIGH (ref 135–145)

## 2017-04-24 LAB — GLUCOSE, CAPILLARY
GLUCOSE-CAPILLARY: 67 mg/dL (ref 65–99)
GLUCOSE-CAPILLARY: 111 mg/dL — AB (ref 65–99)
GLUCOSE-CAPILLARY: 121 mg/dL — AB (ref 65–99)
GLUCOSE-CAPILLARY: 113 mg/dL — AB (ref 65–99)
GLUCOSE-CAPILLARY: 119 mg/dL — AB (ref 65–99)
Glucose-Capillary: 80 mg/dL (ref 65–99)

## 2017-04-24 LAB — MAGNESIUM: Magnesium: 2 mg/dL (ref 1.7–2.4)

## 2017-04-24 LAB — PHOSPHORUS: Phosphorus: 1.6 mg/dL — ABNORMAL LOW (ref 2.5–4.6)

## 2017-04-24 MED ORDER — POTASSIUM CHLORIDE 20 MEQ PO PACK
40.0000 meq | PACK | Freq: Once | ORAL | Status: AC
  Administered 2017-04-24: 40 meq
  Filled 2017-04-24: qty 2

## 2017-04-24 MED ORDER — POTASSIUM PHOSPHATES 15 MMOLE/5ML IV SOLN
30.0000 mmol | Freq: Once | INTRAVENOUS | Status: AC
  Administered 2017-04-24: 30 mmol via INTRAVENOUS
  Filled 2017-04-24: qty 10

## 2017-04-24 MED ORDER — SUCRALFATE 1 GM/10ML PO SUSP
1.0000 g | Freq: Three times a day (TID) | ORAL | Status: DC
  Administered 2017-04-24 – 2017-04-27 (×10): 1 g
  Filled 2017-04-24 (×10): qty 10

## 2017-04-24 MED ORDER — PRO-STAT SUGAR FREE PO LIQD
60.0000 mL | Freq: Two times a day (BID) | ORAL | Status: DC
  Administered 2017-04-24 – 2017-04-26 (×5): 60 mL

## 2017-04-24 MED ORDER — VITAL HIGH PROTEIN PO LIQD
1000.0000 mL | ORAL | Status: DC
  Administered 2017-04-24: 1000 mL
  Administered 2017-04-24 – 2017-04-25 (×3)
  Administered 2017-04-25: 1000 mL
  Administered 2017-04-25 – 2017-04-26 (×4)
  Administered 2017-04-26: 1000 mL
  Administered 2017-04-26 (×6)
  Administered 2017-04-27: 1000 mL
  Administered 2017-04-27 (×2)
  Administered 2017-04-28: 1000 mL

## 2017-04-24 MED ORDER — DEXTROSE 50 % IV SOLN
INTRAVENOUS | Status: AC
  Administered 2017-04-24: 50 mL
  Filled 2017-04-24: qty 50

## 2017-04-24 MED ORDER — STERILE WATER FOR INJECTION IJ SOLN
INTRAMUSCULAR | Status: AC
  Administered 2017-04-24: 10 mL
  Filled 2017-04-24: qty 10

## 2017-04-24 MED ORDER — ACETAZOLAMIDE SODIUM 500 MG IJ SOLR
250.0000 mg | Freq: Four times a day (QID) | INTRAMUSCULAR | Status: AC
  Administered 2017-04-24 – 2017-04-25 (×3): 250 mg via INTRAVENOUS
  Filled 2017-04-24 (×3): qty 500

## 2017-04-24 NOTE — Progress Notes (Addendum)
Initial Nutrition Assessment  DOCUMENTATION CODES:   Obesity unspecified  INTERVENTION:  1. Received verbal consult for tubefeeding from Dr. Lyndel Safe  Begin Vital High Protein via OGT at 6mL/hr increase by 10 every 12 hours to goal rate of 11mL/hr 34mL pro-stat BID, each supplement provides 100 calories and 15 grams of protein  Provides 1360 calories, 144 grams of protein, 768mL free water  2. Recommend 272mL free water Q8H, monitor sodium (149)  3. Monitor and replete electrolytes as needed (K 3.1, Phos 1.6, Mg WNL)  NUTRITION DIAGNOSIS:   Inadequate oral intake related to inability to eat as evidenced by NPO status.  GOAL:   Provide needs based on ASPEN/SCCM guidelines  MONITOR:   I & O's, Labs, TF tolerance, Vent status, Weight trends  REASON FOR ASSESSMENT:   Ventilator   ASSESSMENT:   71 yo morbidly obese female with PMH of CHF, HTN, Diabetes, CKD IV, presents with respiratory distress, worsening shortness of breath. Was on BiPAP but unable to maintain respiratory status, was intubated yesterday.     Family at bedside. Patient was eating normally PTA, no weight loss. No issues chewing/swallowing. NFPE WNL, mild non-piting edema.   Access: RIJ CVC OGT tip to stomach  Patient is currently intubated on ventilator support MV: 11.2 L/min Temp (24hrs), Avg:98.9 F (37.2 C), Min:98.6 F (37 C), Max:99.4 F (37.4 C) Propofol: None   Intake/Output Summary (Last 24 hours) at 04/24/17 1414 Last data filed at 04/24/17 1200  Gross per 24 hour  Intake           503.75 ml  Output             2440 ml  Net         -1936.25 ml  8.2L fluid negative so far 870mL UOP so far today. 1661mL UOP yesterday. Nephrology declining to use ultrafiltration in CRRT at this time. Requesting to increase free water flushes due to Na 149. No IV fluids at this time.  Labs reviewed:  CBGs 111, 67  Na 149, K 3.1, Phos 1.6  Medications reviewed and include:  Novolog 0-15 Units TID, 0-5  Units HS, Lantus 30 units at 10pm Fentanyl gtt  Diet Order:  Diet NPO time specified  Skin:  Reviewed, no issues  Last BM:  04/19/2017  Height:   Ht Readings from Last 1 Encounters:  04/19/17 5\' 5"  (1.651 m)    Weight:   Wt Readings from Last 1 Encounters:  04/24/17 (!) 302 lb 11.1 oz (137.3 kg)    Ideal Body Weight:  56.81 kg  BMI:  Body mass index is 50.37 kg/m.  Estimated Nutritional Needs:   Kcal:  1250-1420 calories (IBW x22-25 cal/kg IBW)  Protein:  >/= 142 grams (2.5g/kg IBW)  Fluid:  >1.5L  EDUCATION NEEDS:   No education needs identified at this time  Satira Anis. Walid Haig, MS, RD LDN Inpatient Clinical Dietitian Pager [phone]

## 2017-04-24 NOTE — Progress Notes (Signed)
PT Cancellation Note  Patient Details Name: Veronica Anderson MRN: [id number] DOB: 03/08/1946   Cancelled Treatment:    Reason Eval/Treat Not Completed: Medical issues which prohibited therapy Pt intubated and sedated, not appropriate for PT at this time.  Will complete orders, will need new orders when pt is appropriate.  Kreg Shropshire, DPT 04/24/2017, 4:21 PM

## 2017-04-24 NOTE — Progress Notes (Signed)
MEDICATION RELATED CONSULT NOTE - INITIAL   Pharmacy Consult for electrolyte management   Pharmacy consulted for electrolyte management for 71 yo female admitted with CHF exacerbation. Patient currently ordered furosemide 60mg  IV q8hr. Patient initiated on tube feeds on 10/12.   Plan:  Will replace with potassium chloride 39mEq VT x 1 and potassium phosphate 68mmol IV x 1. This will provide a total of 84 mEq of potassium replacement (72mEq from potassium phosphate). Will recheck all electrolytes with am labs.    Allergies  Allergen Reactions  . Ace Inhibitors Cough    Patient Measurements: Height: 5\' 5"  (165.1 cm) Weight: (!) 302 lb 11.1 oz (137.3 kg) IBW/kg (Calculated) : 57   Vital Signs: Temp: 98.6 F (37 C) (10/12 0800) Temp Source: Axillary (10/12 0800) BP: 120/52 (10/12 1000) Pulse Rate: 77 (10/12 1000) Intake/Output from previous day: 10/11 0701 - 10/12 0700 In: 260.8 [I.V.:160.8; NG/GT:100] Out: 1625 [Urine:1625] Intake/Output from this shift: Total I/O In: 163 [I.V.:83; NG/GT:80] Out: 775 [Urine:775]  Labs:  Recent Labs  04/22/17 0539 04/23/17 1211 04/24/17 0608  CREATININE 1.77* 1.68* 1.65*  MG 1.7 2.2 2.0  PHOS  --   --  1.6*   Estimated Creatinine Clearance: 44 mL/min (A) (by C-G formula based on SCr of 1.65 mg/dL (H)).   Medical History: Past Medical History:  Diagnosis Date  . Allergy   . Anemia   . Asthma   . Cataract    bilateral  . CHF (NYHA class II, ACC/AHA stage C) ([place])   . Chronic kidney disease    stage IV (severe), Dr. Aleene Davidson  . Constipation   . Diabetes mellitus without complication ([place])   . Edema    feet/ankles  . GERD (gastroesophageal reflux disease)   . Hyperlipidemia   . Hypertension   . Left ankle pain   . Lumbago   . Myocardial infarction ([place])   . Neuropathy   . Neuropathy   . OA (osteoarthritis)   . Obesity   . Obstructive sleep apnea syndrome    CPAP  . Paresthesia    Foot  . Protein calorie malnutrition  ([place])   . Requires supplemental oxygen   . Stroke ([place])   . Vitamin D deficiency     Pharmacy will continue to monitor and adjust per consult.   Oralia Manis 04/24/2017,11:35 AM

## 2017-04-24 NOTE — Progress Notes (Signed)
[place] [facility] [place] NAME: Veronica Anderson    MR#:  [id number]  DATE OF BIRTH:  November 08, 1945  SUBJECTIVE: Respiratory distress last night, intubated and sedated now. Full vent support at this time.   CHIEF COMPLAINT:   Chief Complaint  Patient presents with  . Respiratory Distress   -   REVIEW OF SYSTEMS:  Review of Systems  Unable to perform ROS: Intubated  Constitutional: Positive for malaise/fatigue. Negative for chills and fever.  HENT: Negative for ear discharge, hearing loss and nosebleeds.   Respiratory: Positive for shortness of breath. Negative for cough and wheezing.   Cardiovascular: Negative for chest pain and palpitations.  Gastrointestinal: Negative for abdominal pain, constipation, diarrhea, nausea and vomiting.  Genitourinary: Negative for dysuria.  Musculoskeletal: Negative for myalgias and neck pain.  Neurological: Negative for dizziness, sensory change, speech change, focal weakness, seizures and headaches.  Psychiatric/Behavioral: Negative for depression.    DRUG ALLERGIES:   Allergies  Allergen Reactions  . Ace Inhibitors Cough    VITALS:  Blood pressure 107/60, pulse 69, temperature 99.4 F (37.4 C), temperature source Axillary, resp. rate 15, height 5\' 5"  (1.651 m), weight (!) 137.3 kg (302 lb 11.1 oz), SpO2 100 %.  PHYSICAL EXAMINATION:  Physical Exam  GENERAL:  71 y.o.-year-old obese patient lying in the bed , Currently intubated, sedated. EYES: Pupils equal, round, reactive to light.. No scleral icterus. HEENT: Head atraumatic, normocephalic. Orally intubated. NECK:  Supple, no jugular venous distention. No thyroid enlargement, no tenderness.  LUNGS: Diminished breath sounds bilaterally. CARDIOVASCULAR: S1, S2 normal. No murmurs, rubs, or gallops.  ABDOMEN: Soft, nontender, nondistended. Bowel sounds present. No organomegaly or mass.  EXTREMITIES: No pedal edema, cyanosis, or clubbing.  NEUROLOGIC:  Cranial nerves II through XII are intact. Muscle strength 5/5 in all extremities. Sensation intact. Gait not checked.  PSYCHIATRIC: The patient is alert and oriented x 3.  SKIN: No obvious rash, lesion, or ulcer.    LABORATORY PANEL:   CBC  Recent Labs Lab 04/21/17 0519  WBC 6.0  HGB 7.6*  HCT 25.0*  PLT 213   ------------------------------------------------------------------------------------------------------------------  Chemistries   Recent Labs Lab 04/19/17 0542  04/24/17 0608  NA 145  < > 149*  K 3.1*  < > 3.1*  CL 101  < > 99*  CO2 32  < > 38*  GLUCOSE 205*  < > 74  BUN 24*  < > 30*  CREATININE 1.86*  < > 1.65*  CALCIUM 8.6*  < > 8.3*  MG  --   < > 2.0  AST 13*  --   --   ALT 9*  --   --   ALKPHOS 98  --   --   BILITOT 1.0  --   --   < > = values in this interval not displayed. ------------------------------------------------------------------------------------------------------------------  Cardiac Enzymes  Recent Labs Lab 04/19/17 0542  TROPONINI 0.03*   ------------------------------------------------------------------------------------------------------------------  RADIOLOGY:  Dg Chest 1 View  Result Date: 04/23/2017 CLINICAL DATA:  Endotracheal tube and central venous catheter placement EXAM: CHEST 1 VIEW COMPARISON:  Chest radiograph 04/23/2017 FINDINGS: Endotracheal tube tip is about 3 cm above the inferior margin of the carina. This could be retracted by approximately 3 cm for optimal positioning. The right IJ central venous catheter is at the cavoatrial junction. Orogastric tube courses below the diaphragm. Limited assessment of the lungs due to beam attenuation by patient body habitus. No large area of consolidation. Suspected mild pulmonary  edema. IMPRESSION: ET tube tip 3 cm above the inferior margin of the carina. Right IJ CVC at the cavoatrial junction. Electronically Signed   By: Ulyses Jarred M.D.   On: 04/23/2017 19:58   Dg Chest 1  View  Result Date: 04/23/2017 CLINICAL DATA:  Shortness of breath, CHF EXAM: CHEST 1 VIEW COMPARISON:  04/19/2017 FINDINGS: There is bilateral diffuse mild interstitial thickening. There is no focal consolidation. There is no pleural effusion or pneumothorax. There is stable cardiomegaly. The osseous structures are unremarkable. IMPRESSION: Mild CHF. Electronically Signed   By: Kathreen Devoid   On: 04/23/2017 14:49   Dg Abd 1 View  Result Date: 04/23/2017 CLINICAL DATA:  71 year old female enteric tube placement. EXAM: ABDOMEN - 1 VIEW COMPARISON:  Portable chest 1914 hours today. FINDINGS: Portable AP semi upright view at 1914 hours. Enteric tube is visible coursing into the abdomen over midline from the mediastinum. The tip and side hole are not clearly identified. Large body habitus with paucity of bowel gas in the visible abdomen. IMPRESSION: The enteric tube courses into the abdomen over midline. The tip and/or side-hole are not clearly delineated but I suspect the tube terminates in the stomach. Electronically Signed   By: Genevie Ann M.D.   On: 04/23/2017 21:37   Ct Chest Wo Contrast  Result Date: 04/24/2017 CLINICAL DATA:  Respiratory failure EXAM: CT CHEST WITHOUT CONTRAST TECHNIQUE: Multidetector CT imaging of the chest was performed following the standard protocol without IV contrast. COMPARISON:  Chest radiographs dated 04/23/2017 FINDINGS: Cardiovascular: The heart is normal in size. No pericardial effusion. No evidence of thoracic aortic aneurysm. Atherosclerotic calcifications aortic arch. Three vessel coronary atherosclerosis. Right chest port terminating at the cavoatrial junction. Mediastinum/Nodes: No suspicious mediastinal lymphadenopathy. Visualized thyroid is unremarkable. Lungs/Pleura: Endotracheal tube terminates 6 cm above the carina. Patchy left lower lobe opacity (series 3/image 97), suspicious for pneumonia. Additional mild patchy opacity in the posterior right upper lobe and right  middle lobe, atelectasis versus pneumonia. Mosaic attenuation, suggesting air trapping or small airways disease. Moderate bilateral pleural effusions.  No pneumothorax. Upper Abdomen: Visualized upper abdomen is grossly unremarkable, noting an enteric tube coursing into the mid stomach. Musculoskeletal: Mild degenerative changes of the visualized thoracolumbar spine. IMPRESSION: Patchy left lower lobe opacity, suspicious for pneumonia. Additional mild patchy opacity in the posterior right upper lobe and right middle lobe, atelectasis versus pneumonia. Moderate bilateral pleural effusions. Endotracheal tube terminates 6 cm above the carina. Aortic Atherosclerosis (ICD10-I70.0). Electronically Signed   By: Julian Hy M.D.   On: 04/24/2017 12:19    EKG:   Orders placed or performed during the hospital encounter of 04/19/17  . ED EKG  . ED EKG  . EKG 12-Lead  . EKG 12-Lead  . EKG    ASSESSMENT AND PLAN:   71 year old female, morbidly obese with past medical history significant for chronic diastolic heart failure, hypertension, diabetes and CK D stage III presents to hospital secondary to worsening shortness of breath.  * Acute on chronic hypoxic respiratory failure' Worsening respiratory failure last night, currently intubated, sedated; CT chest this morning showed a left lower lobe pneumonia, patchy opacity in the right upper lobe and right middle lobe. Patient has multifocal pneumonia.   . Nephrology is consulted for hypernatremia, hypokalemia: -  -#2 chronic kidney disease stage III with baseline GFR 35: Not a candidate for hemodialysis at this time. Continue free water flushes via NG tube for hypernatremia, sodium is 149. Hypokalemia due to diuretics, replace the potassium.  Creatinine is stable at 1.6.  * Hypertension.  Metoprolol and clonidine Also on hydralazine prn  * Insulin-dependent diabetes mellitus. ON lantus. Sliding scale insulin.  * CKD stage III is stable.    Monitor while on diuretics  * DVT prophylaxis with Lovenox  D/w son.   All the records are reviewed and case discussed with Care Management/Social Workerr. Management plans discussed with the patient, family and they are in agreement.  CODE STATUS: Full Code  TOTAL TIME TAKING CARE OF THIS PATIENT: 36 minutes.   POSSIBLE D/C IN 2-3 DAYS, DEPENDING ON CLINICAL CONDITION.   Epifanio Lesches M.D on 04/24/2017 at 2:25 PM  Between 7am to 6pm - Pager - [phone]  After 6pm go to [email or web address] - password [email or web address] [facility]  Office  [phone]  CC: Primary care physician; Hermelinda Dellen, DO

## 2017-04-24 NOTE — Progress Notes (Signed)
[facility] Progess Note    ASSESSMENT  Acute on chronic hypoxic respiratory failure Acute and chronic heart failure Acute on chronic kidney injury Hypernatremia Hypokalemia Hypophosphatemia Anemia Hypoalbuminemia  PLAN Weaning from ventilator as tolerated. Maintain saturation above 90%. Monitor urine output and creatinine closely. We'll stop Lasix. Try acetazolamide. Monitor mental status closely. [place] replacement. Electrolyte replacement. No indication for transfusion. Full code. No family at bedside.  CC: [place] Nathalie Cavendish MD PCCM    SUBJECTIVE:  Patient seen and examined at bedside. Noted events overnight. Intermittently lasting for respiratory failure. CT chest reviewed independently.   VITAL SIGNS: Temp:  [98.6 F (37 C)-99.4 F (37.4 C)] 99.2 F (37.3 C) (10/12 1600) Pulse Rate:  [49-83] 69 (10/12 1500) Resp:  [0-23] 0 (10/12 1500) BP: (102-177)/(44-141) 102/56 (10/12 1500) SpO2:  [93 %-100 %] 100 % (10/12 1500) FiO2 (%):  [40 %] 40 % (10/12 1600) Weight:  [302 lb 11.1 oz (137.3 kg)] 302 lb 11.1 oz (137.3 kg) (10/12 0329)   PHYSICAL EXAMINATION: Physical Examination:   VS: BP (!) 102/56   Pulse 69   Temp 99.2 F (37.3 C) (Axillary)   Resp (!) 0   Ht 5' 5"  (1.651 m)   Wt (!) 302 lb 11.1 oz (137.3 kg)   SpO2 100%   BMI 50.37 kg/m    General Appearance: Intubated and sedated Neuro: Sedated HEENT: Intubated Pulmonary: Decreased air entry bilaterally Cardiovascular :  S1,S2.  No m/r/g.   Abdomen: Benign, Soft, non-tender. Extremities: Edema noted  LABORATORY PANEL:   CBC  Recent Labs Lab 04/21/17 0519  WBC 6.0  HGB 7.6*  HCT 25.0*  PLT 213    Chemistries   Recent Labs Lab 04/19/17 0542  04/24/17 0608  NA 145  < > 149*  K 3.1*  < > 3.1*  CL 101  < > 99*  CO2 32  < > 38*  GLUCOSE 205*  < > 74  BUN 24*  < > 30*  CREATININE 1.86*  < > 1.65*  CALCIUM 8.6*  < > 8.3*  MG  --   < > 2.0  PHOS   --   < > 1.6*  AST 13*  --   --   ALT 9*  --   --   ALKPHOS 98  --   --   BILITOT 1.0  --   --   < > = values in this interval not displayed.   Recent Labs Lab 04/23/17 1618 04/23/17 2118 04/24/17 0745 04/24/17 0833 04/24/17 1214 04/24/17 1623  GLUCAP 113* 119* 67 111* 80 121*    Recent Labs Lab 04/23/17 1220 04/23/17 1640 04/23/17 2045  PHART 7.35 7.33* 7.54*  PCO2ART 100* 105* 60*  PO2ART 57* 79* 109*    Recent Labs Lab 04/19/17 0542  AST 13*  ALT 9*  ALKPHOS 98  BILITOT 1.0  ALBUMIN 3.1*    Cardiac Enzymes  Recent Labs Lab 04/19/17 0542  TROPONINI 0.03*    RADIOLOGY:  Dg Chest 1 View  Result Date: 04/23/2017 CLINICAL DATA:  Endotracheal tube and central venous catheter placement EXAM: CHEST 1 VIEW COMPARISON:  Chest radiograph 04/23/2017 FINDINGS: Endotracheal tube tip is about 3 cm above the inferior margin of the carina. This could be retracted by approximately 3 cm for optimal positioning. The right IJ central venous catheter is at the cavoatrial junction. Orogastric tube courses below the diaphragm. Limited assessment of the lungs due to beam attenuation by patient body habitus.  No large area of consolidation. Suspected mild pulmonary edema. IMPRESSION: ET tube tip 3 cm above the inferior margin of the carina. Right IJ CVC at the cavoatrial junction. Electronically Signed   By: Ulyses Jarred M.D.   On: 04/23/2017 19:58   Dg Chest 1 View  Result Date: 04/23/2017 CLINICAL DATA:  Shortness of breath, CHF EXAM: CHEST 1 VIEW COMPARISON:  04/19/2017 FINDINGS: There is bilateral diffuse mild interstitial thickening. There is no focal consolidation. There is no pleural effusion or pneumothorax. There is stable cardiomegaly. The osseous structures are unremarkable. IMPRESSION: Mild CHF. Electronically Signed   By: Kathreen Devoid   On: 04/23/2017 14:49   Dg Abd 1 View  Result Date: 04/23/2017 CLINICAL DATA:  71 year old female enteric tube placement. EXAM:  ABDOMEN - 1 VIEW COMPARISON:  Portable chest 1914 hours today. FINDINGS: Portable AP semi upright view at 1914 hours. Enteric tube is visible coursing into the abdomen over midline from the mediastinum. The tip and side hole are not clearly identified. Large body habitus with paucity of bowel gas in the visible abdomen. IMPRESSION: The enteric tube courses into the abdomen over midline. The tip and/or side-hole are not clearly delineated but I suspect the tube terminates in the stomach. Electronically Signed   By: Genevie Ann M.D.   On: 04/23/2017 21:37   Ct Chest Wo Contrast  Result Date: 04/24/2017 CLINICAL DATA:  Respiratory failure EXAM: CT CHEST WITHOUT CONTRAST TECHNIQUE: Multidetector CT imaging of the chest was performed following the standard protocol without IV contrast. COMPARISON:  Chest radiographs dated 04/23/2017 FINDINGS: Cardiovascular: The heart is normal in size. No pericardial effusion. No evidence of thoracic aortic aneurysm. Atherosclerotic calcifications aortic arch. Three vessel coronary atherosclerosis. Right chest port terminating at the cavoatrial junction. Mediastinum/Nodes: No suspicious mediastinal lymphadenopathy. Visualized thyroid is unremarkable. Lungs/Pleura: Endotracheal tube terminates 6 cm above the carina. Patchy left lower lobe opacity (series 3/image 97), suspicious for pneumonia. Additional mild patchy opacity in the posterior right upper lobe and right middle lobe, atelectasis versus pneumonia. Mosaic attenuation, suggesting air trapping or small airways disease. Moderate bilateral pleural effusions.  No pneumothorax. Upper Abdomen: Visualized upper abdomen is grossly unremarkable, noting an enteric tube coursing into the mid stomach. Musculoskeletal: Mild degenerative changes of the visualized thoracolumbar spine. IMPRESSION: Patchy left lower lobe opacity, suspicious for pneumonia. Additional mild patchy opacity in the posterior right upper lobe and right middle lobe,  atelectasis versus pneumonia. Moderate bilateral pleural effusions. Endotracheal tube terminates 6 cm above the carina. Aortic Atherosclerosis (ICD10-I70.0). Electronically Signed   By: Julian Hy M.D.   On: 04/24/2017 12:19      Current Facility-Administered Medications:  .  acetaminophen (TYLENOL) tablet 650 mg, 650 mg, Oral, Q6H PRN, 650 mg at 04/21/17 1510 **OR** acetaminophen (TYLENOL) suppository 650 mg, 650 mg, Rectal, Q6H PRN, Sudini, Srikar, MD .  aspirin EC tablet 81 mg, 81 mg, Oral, QHS, Sudini, Srikar, MD, 81 mg at 04/23/17 2200 .  chlorhexidine (PERIDEX) 0.12 % solution 15 mL, 15 mL, Mouth Rinse, BID, Conforti, John, DO, 15 mL at 04/23/17 2200 .  chlorhexidine gluconate (MEDLINE KIT) (PERIDEX) 0.12 % solution 15 mL, 15 mL, Mouth Rinse, BID, Kasa, Kurian, MD, 15 mL at 04/24/17 0823 .  Chlorhexidine Gluconate Cloth 2 % PADS 6 each, 6 each, Topical, Q0600, Conforti, John, DO, 6 each at 04/22/17 0600 .  cloNIDine (CATAPRES - Dosed in mg/24 hr) patch 0.1 mg, 0.1 mg, Transdermal, Weekly, Awilda Bill, NP, 0.1 mg at 04/21/17 0051 .  enoxaparin (LOVENOX) injection 40 mg, 40 mg, Subcutaneous, Q12H, Sudini, Srikar, MD, 40 mg at 04/24/17 1022 .  feeding supplement (PRO-STAT SUGAR FREE 64) liquid 60 mL, 60 mL, Per Tube, BID, Dimas Chyle, MD, 60 mL at 04/24/17 1430 .  feeding supplement (VITAL HIGH PROTEIN) liquid 1,000 mL, 1,000 mL, Per Tube, Q24H, Dimas Chyle, MD .  fentaNYL (SUBLIMAZE) bolus via infusion 25 mcg, 25 mcg, Intravenous, Q1H PRN, Varughese, Bincy S, NP .  fentaNYL 2565mg in NS 2564m(1077mml) infusion-PREMIX, 25-400 mcg/hr, Intravenous, Continuous, Varughese, Bincy S, NP, Last Rate: 20 mL/hr at 04/24/17 1600, 200 mcg/hr at 04/24/17 1600 .  furosemide (LASIX) injection 60 mg, 60 mg, Intravenous, Q8H, Kasa, Kurian, MD, 60 mg at 04/24/17 1441 .  gabapentin (NEURONTIN) capsule 300 mg, 300 mg, Oral, TID, Sudini, Srikar, MD, 300 mg at 04/24/17 1645 .  hydrALAZINE (APRESOLINE)  injection 20 mg, 20 mg, Intravenous, Q2H PRN, KasFlora LippsD, 20 mg at 04/20/17 2312 .  insulin aspart (novoLOG) injection 0-15 Units, 0-15 Units, Subcutaneous, TID WC, SudHillary BowD, 2 Units at 04/24/17 1646 .  insulin aspart (novoLOG) injection 0-5 Units, 0-5 Units, Subcutaneous, QHS, Sudini, Srikar, MD, 0 Units at 04/20/17 2240 .  insulin glargine (LANTUS) injection 30 Units, 30 Units, Subcutaneous, Q2200, SudHillary BowD, 30 Units at 04/23/17 2216 .  ipratropium-albuterol (DUONEB) 0.5-2.5 (3) MG/3ML nebulizer solution 3 mL, 3 mL, Nebulization, Q6H PRN, Tukov, Magadalene S, NP, 3 mL at 04/19/17 2103 .  ipratropium-albuterol (DUONEB) 0.5-2.5 (3) MG/3ML nebulizer solution 3 mL, 3 mL, Nebulization, Q6H, Kalisetti, Radhika, MD, 3 mL at 04/24/17 1312 .  MEDLINE mouth rinse, 15 mL, Mouth Rinse, q12n4p, Conforti, John, DO, 15 mL at 04/24/17 1649 .  MEDLINE mouth rinse, 15 mL, Mouth Rinse, QID, Kasa, Kurian, MD, 15 mL at 04/24/17 1027 .  metoprolol tartrate (LOPRESSOR) tablet 100 mg, 100 mg, Oral, BID, Sudini, Srikar, MD, 100 mg at 04/24/17 1022 .  midazolam (VERSED) injection 1 mg, 1 mg, Intravenous, Q15 min PRN, Varughese, Bincy S, NP, 1 mg at 04/24/17 1134 .  midazolam (VERSED) injection 1 mg, 1 mg, Intravenous, Q2H PRN, Varughese, Bincy S, NP .  montelukast (SINGULAIR) tablet 10 mg, 10 mg, Oral, Daily, Sudini, Srikar, MD, 10 mg at 04/23/17 2200 .  norepinephrine (LEVOPHED) 4 mg in dextrose 5 % 250 mL (0.016 mg/mL) infusion, 0-40 mcg/min, Intravenous, Titrated, KasFlora LippsD, Stopped at 04/23/17 1945 .  ondansetron (ZOFRAN) injection 4 mg, 4 mg, Intravenous, Q6H PRN, KasFlora LippsD, 4 mg at 04/23/17 1011 .  polyethylene glycol (MIRALAX / GLYCOLAX) packet 17 g, 17 g, Oral, Daily PRN, Sudini, Srikar, MD .  potassium PHOSPHATE 30 mmol in dextrose 5 % 500 mL infusion, 30 mmol, Intravenous, Once, KonEpifanio LeschesD, Last Rate: 85 mL/hr at 04/24/17 1430, 30 mmol at 04/24/17 1430 .   pravastatin (PRAVACHOL) tablet 40 mg, 40 mg, Oral, q1800, Sudini, Srikar, MD, 40 mg at 04/22/17 1702 .  sodium chloride flush (NS) 0.9 % injection 3 mL, 3 mL, Intravenous, Q12H, Sudini, Srikar, MD, 3 mL at 04/24/17 1022 .  sucralfate (CARAFATE) 1 GM/10ML suspension 1 g, 1 g, Per Tube, TID, BlaAwilda BillP, 1 g at 04/24/17 164Elbertpta MD   04/24/2017

## 2017-04-24 NOTE — Progress Notes (Signed)
CBG  Recheck 111

## 2017-04-24 NOTE — Consult Note (Signed)
[facility] CONSULT NOTE    Date: 04/24/2017                  Patient Name:  Veronica Anderson  MRN: [id number]  DOB: October 10, 1945  Age / Sex: 71 y.o., female         PCP: Hermelinda Dellen, DO                 Service Requesting Consult: Pulmonary/Critical Care                 Reason for Consult: Chronic kidney disease stage III in setting of CHF            History of Present Illness: Patient is a 71 y.o. female with a PMHx of anemia of chronic kidney disease, congestive heart failure, chronic kidney disease stage III baseline EGFR 35, diabetes mellitus type 2, peripheral edema, GERD, hypertension, hyperlipidemia, peripheral neuropathy, obesity, history of CVA, obstructive sleep apnea,who was admitted to [facility] on 04/19/2017 for evaluation of respiratory distress. Patient unable to offer any history as she is currently on the ventilator. Her son is at the bedside. Prior to admission she wgressive shortness of breath and lower extremity edema. She has known diastolic heart failure. Most recent ejection fractial at 55-60%. She was iniunfortunately deteriorated and as ab the ventilator now. We are now asked to see the patient to see if she requires ultrafiltration.  Urine output was good yesterday at 1.6 L.  Serum sodium is currently high at 149. She is maintained on Lasix.   Medications: Outpatient medications: Prescriptions Prior to Admission  Medication Sig Dispense Refill Last Dose  . amLODipine (NORVASC) 10 MG tablet TAKE 1 TABLET (10 MG TOTAL) BY MOUTH DAILY. 90 tablet 1 04/18/2017 at Unknown time  . aspirin EC 81 MG tablet Take 81 mg by mouth at bedtime.   04/18/2017 at Unknown time  . cloNIDine (CATAPRES - DOSED IN MG/24 HR) 0.3 mg/24hr patch PLACE 1 PATCH (0.3 MG TOTAL) ONTO THE SKIN ONCE A WEEK. APPLY PATCH ON SUNDAY. 12 patch 1 04/18/2017 at Unknown time  . desonide (DESOWEN) 0.05 % ointment Apply 1 application topically 2 (two) times daily. 60 g 0 prn  . furosemide  (LASIX) 20 MG tablet Take 1 tablet (20 mg total) by mouth 2 (two) times daily. 180 tablet 1 04/18/2017 at Unknown time  . gabapentin (NEURONTIN) 300 MG capsule TAKE 1 CAPSULE (300 MG TOTAL) BY MOUTH 3 (THREE) TIMES DAILY. 270 capsule 2 04/18/2017 at Unknown time  . hydrALAZINE (APRESOLINE) 25 MG tablet Take 1 tablet (25 mg total) by mouth 3 (three) times daily. 90 tablet 0 04/18/2017 at Unknown time  . Insulin Glargine (LANTUS SOLOSTAR) 100 UNIT/ML Solostar Pen INJECT 50 UNITS SUBCUTANEOUSLY DAILY 15 pen 1 04/18/2017 at Unknown time  . insulin lispro (HUMALOG KWIKPEN) 100 UNIT/ML KiwkPen INJECT 5 TO 10 UNITS SUBCUTANEOUSLY BEFORE MEALS (Patient taking differently: Inject 5 Units into the skin 3 (three) times daily. ) 6 mL 5 04/18/2017 at Unknown time  . loratadine (CLARITIN) 10 MG tablet TAKE 1 TABLET BY MOUTH EVERY DAY FOR ALLERGIES 30 tablet 5 04/18/2017 at Unknown time  . metoprolol (LOPRESSOR) 100 MG tablet Take 1 tablet (100 mg total) by mouth 2 (two) times daily. 180 tablet 2 04/18/2017 at Unknown time  . montelukast (SINGULAIR) 10 MG tablet Take 1 tablet (10 mg total) by mouth daily. 90 tablet 2 04/18/2017 at Unknown time  . pravastatin (PRAVACHOL) 40 MG tablet TAKE 1 TABLET  BY MOUTH ONCE A DAY FOR CHOLESTEROL 90 tablet 2 04/18/2017 at Unknown time  . ranitidine (ZANTAC) 150 MG tablet TAKE 1 TABLET (150 MG TOTAL) BY MOUTH 2 (TWO) TIMES DAILY. TRYING TO WEAN OFF OMEPRAZOLE 180 tablet 1 04/18/2017 at Unknown time  . Vitamin D, Ergocalciferol, (DRISDOL) 50000 units CAPS capsule TAKE ONE CAPSULE BY MOUTH ONCE A WEEK 4 capsule 1 Past Week at Unknown time  . ACCU-CHEK AVIVA PLUS test strip CHECK BLOOD SUGAR TWICE DAILY 200 each 3   . NOVOFINE 32G X 6 MM MISC USE 4 TIMES A DAY AS DIRECTED 100 each 5   . nystatin (NYSTATIN) powder Apply 1 g topically 3 (three) times daily. 45 g 1     Current medications: Current Facility-Administered Medications  Medication Dose Route Frequency Provider Last Rate Last Dose   . acetaminophen (TYLENOL) tablet 650 mg  650 mg Oral Q6H PRN Hillary Bow, MD   650 mg at 04/21/17 1510   Or  . acetaminophen (TYLENOL) suppository 650 mg  650 mg Rectal Q6H PRN Sudini, Alveta Heimlich, MD      . aspirin EC tablet 81 mg  81 mg Oral QHS Hillary Bow, MD   81 mg at 04/23/17 2200  . chlorhexidine (PERIDEX) 0.12 % solution 15 mL  15 mL Mouth Rinse BID Conforti, John, DO   15 mL at 04/23/17 2200  . chlorhexidine gluconate (MEDLINE KIT) (PERIDEX) 0.12 % solution 15 mL  15 mL Mouth Rinse BID Flora Lipps, MD   15 mL at 04/24/17 0823  . Chlorhexidine Gluconate Cloth 2 % PADS 6 each  6 each Topical Q0600 Conforti, John, DO   6 each at 04/22/17 0600  . cloNIDine (CATAPRES - Dosed in mg/24 hr) patch 0.1 mg  0.1 mg Transdermal Weekly Awilda Bill, NP   0.1 mg at 04/21/17 0051  . enoxaparin (LOVENOX) injection 40 mg  40 mg Subcutaneous Q12H Hillary Bow, MD   40 mg at 04/23/17 2201  . fentaNYL (SUBLIMAZE) bolus via infusion 25 mcg  25 mcg Intravenous Q1H PRN Varughese, Bincy S, NP      . fentaNYL 2557mg in NS 2551m(1012mml) infusion-PREMIX  25-400 mcg/hr Intravenous Continuous Varughese, Bincy S, NP 20 mL/hr at 04/24/17 0800 200 mcg/hr at 04/24/17 0800  . furosemide (LASIX) injection 60 mg  60 mg Intravenous Q8H KasFlora LippsD   60 mg at 04/24/17 [id number] gabapentin (NEURONTIN) capsule 300 mg  300 mg Oral TID SudHillary BowD   300 mg at 04/23/17 2200  . hydrALAZINE (APRESOLINE) injection 20 mg  20 mg Intravenous Q2H PRN KasFlora LippsD   20 mg at 04/20/17 2312  . insulin aspart (novoLOG) injection 0-15 Units  0-15 Units Subcutaneous TID WC SudHillary BowD   2 Units at 04/22/17 1658  . insulin aspart (novoLOG) injection 0-5 Units  0-5 Units Subcutaneous QHS SudHillary BowD   0 Units at 04/20/17 2240  . insulin glargine (LANTUS) injection 30 Units  30 Units Subcutaneous Q2200 SudHillary BowD   30 Units at 04/23/17 2216  . ipratropium-albuterol (DUONEB) 0.5-2.5 (3) MG/3ML  nebulizer solution 3 mL  3 mL Nebulization Q6H PRN TukDorene Sorrow NP   3 mL at 04/19/17 2103  . ipratropium-albuterol (DUONEB) 0.5-2.5 (3) MG/3ML nebulizer solution 3 mL  3 mL Nebulization Q6H KalGladstone LighterD   3 mL at 04/24/17 0750  . MEDLINE mouth rinse  15 mL Mouth Rinse q12n4p Conforti, John, DO   15 mL at  04/23/17 1600  . MEDLINE mouth rinse  15 mL Mouth Rinse BID Conforti, John, DO   15 mL at 04/23/17 2211  . MEDLINE mouth rinse  15 mL Mouth Rinse QID Flora Lipps, MD   15 mL at 04/24/17 0329  . metoprolol tartrate (LOPRESSOR) tablet 100 mg  100 mg Oral BID Hillary Bow, MD   100 mg at 04/23/17 2200  . midazolam (VERSED) injection 1 mg  1 mg Intravenous Q15 min PRN Varughese, Bincy S, NP      . midazolam (VERSED) injection 1 mg  1 mg Intravenous Q2H PRN Varughese, Bincy S, NP      . montelukast (SINGULAIR) tablet 10 mg  10 mg Oral Daily Sudini, Alveta Heimlich, MD   10 mg at 04/23/17 2200  . mupirocin ointment (BACTROBAN) 2 % 1 application  1 application Nasal BID Conforti, John, DO   1 application at 70/78/67 2201  . norepinephrine (LEVOPHED) 4 mg in dextrose 5 % 250 mL (0.016 mg/mL) infusion  0-40 mcg/min Intravenous Titrated Flora Lipps, MD   Stopped at 04/23/17 1945  . ondansetron (ZOFRAN) injection 4 mg  4 mg Intravenous Q6H PRN Flora Lipps, MD   4 mg at 04/23/17 1011  . polyethylene glycol (MIRALAX / GLYCOLAX) packet 17 g  17 g Oral Daily PRN Sudini, Srikar, MD      . potassium chloride (KLOR-CON) packet 40 mEq  40 mEq Per Tube Once Epifanio Lesches, MD      . pravastatin (PRAVACHOL) tablet 40 mg  40 mg Oral q1800 Hillary Bow, MD   40 mg at 04/22/17 1702  . sodium chloride flush (NS) 0.9 % injection 3 mL  3 mL Intravenous Q12H Sudini, Srikar, MD   3 mL at 04/23/17 2211  . sucralfate (CARAFATE) 1 GM/10ML suspension 1 g  1 g Per Tube TID Awilda Bill, NP          Allergies: Allergies  Allergen Reactions  . Ace Inhibitors Cough      Past Medical History: Past  Medical History:  Diagnosis Date  . Allergy   . Anemia   . Asthma   . Cataract    bilateral  . CHF (NYHA class II, ACC/AHA stage C) ([place])   . Chronic kidney disease    stage IV (severe), Dr. Aleene Davidson  . Constipation   . Diabetes mellitus without complication ([place])   . Edema    feet/ankles  . GERD (gastroesophageal reflux disease)   . Hyperlipidemia   . Hypertension   . Left ankle pain   . Lumbago   . Myocardial infarction ([place])   . Neuropathy   . Neuropathy   . OA (osteoarthritis)   . Obesity   . Obstructive sleep apnea syndrome    CPAP  . Paresthesia    Foot  . Protein calorie malnutrition ([place])   . Requires supplemental oxygen   . Stroke ([place])   . Vitamin D deficiency      Past Surgical History: Past Surgical History:  Procedure Laterality Date  . ANKLE SURGERY Left   . CATARACT EXTRACTION W/PHACO Right 12/20/2015   Procedure: CATARACT EXTRACTION PHACO AND INTRAOCULAR LENS PLACEMENT (IOC);  Surgeon: Eulogio Bear, MD;  Location: [facility] ORS;  Service: Ophthalmology;  Laterality: Right;  [place] 2.50 AP% 21.8 CDE 37.04 Fluid pack lot # [id number]  . CATARACT EXTRACTION W/PHACO Left 02/14/2016   Procedure: CATARACT EXTRACTION PHACO AND INTRAOCULAR LENS PLACEMENT (IOC);  Surgeon: Eulogio Bear, MD;  Location: [facility] ORS;  Service:  Ophthalmology;  Laterality: Left;  [place] 3.12 AP% 42.1 CDE 47.12 Fluid Pack lot # [id number]  . COLONOSCOPY    . FRACTURE SURGERY Left    ankle     Family History: Family History  Problem Relation Age of Onset  . Diabetes type II Unknown      Social History: Social History   Social History  . Marital status: Single    Spouse name: N/A  . Number of children: N/A  . Years of education: N/A   Occupational History  . Not on file.   Social History Main Topics  . Smoking status: Former Smoker    Types: Cigarettes    Quit date: 07/14/1994  . Smokeless tobacco: Never Used  . Alcohol use No  . Drug use: No  . Sexual activity: Not  Currently   Other Topics Concern  . Not on file   Social History Narrative  . No narrative on file     Review of Systems: As per HPI  Vital Signs: Blood pressure (!) 125/56, pulse 83, temperature 98.6 F (37 C), temperature source Axillary, resp. rate 16, height 5' 5"  (1.651 m), weight (!) 137.3 kg (302 lb 11.1 oz), SpO2 100 %.  Weight trends: Filed Weights   04/22/17 0500 04/23/17 0500 04/24/17 0329  Weight: (!) 140.6 kg (309 lb 15.5 oz) (!) 140.2 kg (309 lb 1.4 oz) (!) 137.3 kg (302 lb 11.1 oz)    Physical Exam: General: Critically ill appearing  Head: Normocephalic, atraumatic.  Eyes: Eyes closed  Nose: Mucous membranes moist, not inflammed, nonerythematous.  Throat: ETT in place  Neck: Supple, trachea midline.  Lungs:  Scattered rhonchi, vent assisted  Heart: S1S2 no rubs  Abdomen:  BS normoactive. Soft, Nondistended, non-tender.  No masses or organomegaly.  Extremities: 1+ pretibial edema.  Neurologic: intubated  Skin: No visible rashes, scars.    Lab results: Basic Metabolic Panel:  Recent Labs Lab 04/19/17 2014  04/21/17 0519 04/22/17 0539 04/23/17 1211 04/24/17 0608  NA 148*  < > 148* 148* 149* 149*  K 3.3*  < > 3.7 3.6 4.5 3.1*  CL 101  < > 101 98* 99* 99*  CO2 35*  < > 38* 41* 36* 38*  GLUCOSE 158*  < > 126* 109* 134* 74  BUN 23*  < > 26* 29* 29* 30*  CREATININE 1.74*  < > 1.84* 1.77* 1.68* 1.65*  CALCIUM 8.5*  < > 8.4* 8.3* 8.5* 8.3*  MG 1.9  --  1.9 1.7 2.2  --   PHOS 3.9  --   --   --   --   --   < > = values in this interval not displayed.  Liver Function Tests:  Recent Labs Lab 04/19/17 0542  AST 13*  ALT 9*  ALKPHOS 98  BILITOT 1.0  PROT 7.9  ALBUMIN 3.1*    Recent Labs Lab 04/19/17 0542  LIPASE 15   No results for input(s): AMMONIA in the last 168 hours.  CBC:  Recent Labs Lab 04/19/17 0542 04/19/17 2014 04/20/17 0441 04/21/17 0519  WBC 16.1* 10.5 10.7 6.0  NEUTROABS 14.6*  --   --  4.7  HGB 7.9* 7.8* 7.2* 7.6*   HCT 26.0* 25.6* 23.1* 25.0*  MCV 69.1* 70.8* 69.9* 71.6*  PLT 321 248 235 213    Cardiac Enzymes:  Recent Labs Lab 04/19/17 0542  TROPONINI 0.03*    BNP: Invalid input(s): POCBNP  CBG:  Recent Labs Lab 04/23/17 1113 04/23/17 1618 04/23/17 2118  04/24/17 0745 04/24/17 0833  GLUCAP 129* 113* 119* 27 111*    Microbiology: Results for orders placed or performed during the hospital encounter of 04/19/17  MRSA PCR Screening     Status: Abnormal   Collection Time: 04/19/17 10:07 AM  Result Value Ref Range Status   MRSA by PCR POSITIVE (A) NEGATIVE Final    Comment:        The GeneXpert MRSA Assay (FDA approved for NASAL specimens only), is one component of a comprehensive MRSA colonization surveillance program. It is not intended to diagnose MRSA infection nor to guide or monitor treatment for MRSA infections. RESULT CALLED TO, READ BACK BY AND VERIFIED WITH: SANDRA BOREA ON 04/19/17 AT 1228 [facility]     Coagulation Studies: No results for input(s): LABPROT, INR in the last 72 hours.  Urinalysis: No results for input(s): COLORURINE, LABSPEC, PHURINE, GLUCOSEU, HGBUR, BILIRUBINUR, KETONESUR, PROTEINUR, UROBILINOGEN, NITRITE, LEUKOCYTESUR in the last 72 hours.  Invalid input(s): APPERANCEUR    Imaging: Dg Chest 1 View  Result Date: 04/23/2017 CLINICAL DATA:  Endotracheal tube and central venous catheter placement EXAM: CHEST 1 VIEW COMPARISON:  Chest radiograph 04/23/2017 FINDINGS: Endotracheal tube tip is about 3 cm above the inferior margin of the carina. This could be retracted by approximately 3 cm for optimal positioning. The right IJ central venous catheter is at the cavoatrial junction. Orogastric tube courses below the diaphragm. Limited assessment of the lungs due to beam attenuation by patient body habitus. No large area of consolidation. Suspected mild pulmonary edema. IMPRESSION: ET tube tip 3 cm above the inferior margin of the carina. Right IJ CVC at the  cavoatrial junction. Electronically Signed   By: Ulyses Jarred M.D.   On: 04/23/2017 19:58   Dg Chest 1 View  Result Date: 04/23/2017 CLINICAL DATA:  Shortness of breath, CHF EXAM: CHEST 1 VIEW COMPARISON:  04/19/2017 FINDINGS: There is bilateral diffuse mild interstitial thickening. There is no focal consolidation. There is no pleural effusion or pneumothorax. There is stable cardiomegaly. The osseous structures are unremarkable. IMPRESSION: Mild CHF. Electronically Signed   By: Kathreen Devoid   On: 04/23/2017 14:49   Dg Abd 1 View  Result Date: 04/23/2017 CLINICAL DATA:  71 year old female enteric tube placement. EXAM: ABDOMEN - 1 VIEW COMPARISON:  Portable chest 1914 hours today. FINDINGS: Portable AP semi upright view at 1914 hours. Enteric tube is visible coursing into the abdomen over midline from the mediastinum. The tip and side hole are not clearly identified. Large body habitus with paucity of bowel gas in the visible abdomen. IMPRESSION: The enteric tube courses into the abdomen over midline. The tip and/or side-hole are not clearly delineated but I suspect the tube terminates in the stomach. Electronically Signed   By: Genevie Ann M.D.   On: 04/23/2017 21:37      Assessment & Plan: Pt is a 71 y.o. female with a PMHx of anemia of chronic kidney disease, congestive heart failure, chronic kidney disease stage III baseline EGFR 35, diabetes mellitus type 2, peripheral edema, GERD, hypertension, hyperlipidemia, peripheral neuropathy, obesity, history of CVA, obstructive sleep apnea,who was admitted to [facility] on 04/19/2017 for evaluation of respiratory distress.  1.  Chronic kidney disease stage III baseline EGFR 35 2.  Acute respiratory failure. 3.  Acute on chronic diastolic heart failure. 4.  Hypernatremia. 5.  Hypokalemia.  Plan:  We were consulted to determine if the patient requires ultrafiltration at this time. Her serum sodium is actually high indicating that there is intravascular  volume depletion. Patient had good urine output yesterday. Therefore at this point in time she does not require ultrafiltration. We would recommend increasing free water flushes if possible. Otherwise continue supportive care as being delivered at the moment. Overall prognosis guarded given multiple underlying comorbidities and generalized debility.

## 2017-04-25 ENCOUNTER — Inpatient Hospital Stay: Payer: Medicare Other

## 2017-04-25 LAB — GLUCOSE, CAPILLARY
GLUCOSE-CAPILLARY: 145 mg/dL — AB (ref 65–99)
GLUCOSE-CAPILLARY: 201 mg/dL — AB (ref 65–99)
GLUCOSE-CAPILLARY: 226 mg/dL — AB (ref 65–99)
GLUCOSE-CAPILLARY: 272 mg/dL — AB (ref 65–99)
GLUCOSE-CAPILLARY: 316 mg/dL — AB (ref 65–99)
Glucose-Capillary: 200 mg/dL — ABNORMAL HIGH (ref 65–99)
Glucose-Capillary: 263 mg/dL — ABNORMAL HIGH (ref 65–99)

## 2017-04-25 LAB — BASIC METABOLIC PANEL
Anion gap: 11 (ref 5–15)
BUN: 44 mg/dL — AB (ref 6–20)
CO2: 36 mmol/L — ABNORMAL HIGH (ref 22–32)
CREATININE: 2.23 mg/dL — AB (ref 0.44–1.00)
Calcium: 7.7 mg/dL — ABNORMAL LOW (ref 8.9–10.3)
Chloride: 97 mmol/L — ABNORMAL LOW (ref 101–111)
GFR calc non Af Amer: 21 mL/min — ABNORMAL LOW (ref >60)
GFR, EST AFRICAN AMERICAN: 24 mL/min — AB (ref >60)
Glucose, Bld: 226 mg/dL — ABNORMAL HIGH (ref 65–99)
Potassium: 4.1 mmol/L (ref 3.5–5.1)
SODIUM: 144 mmol/L (ref 135–145)

## 2017-04-25 LAB — CBC WITH DIFFERENTIAL/PLATELET
BASOS ABS: 0 10*3/uL (ref 0–0.1)
Basophils Relative: 0 %
EOS PCT: 2 %
Eosinophils Absolute: 0.2 10*3/uL (ref 0–0.7)
HEMATOCRIT: 19.8 % — AB (ref 35.0–47.0)
HEMOGLOBIN: 6 g/dL — AB (ref 12.0–16.0)
LYMPHS PCT: 12 %
Lymphs Abs: 1.1 10*3/uL (ref 1.0–3.6)
MCH: 21.6 pg — ABNORMAL LOW (ref 26.0–34.0)
MCHC: 30.1 g/dL — ABNORMAL LOW (ref 32.0–36.0)
MCV: 72 fL — AB (ref 80.0–100.0)
Monocytes Absolute: 0.9 10*3/uL (ref 0.2–0.9)
Monocytes Relative: 10 %
NEUTROS ABS: 6.9 10*3/uL — AB (ref 1.4–6.5)
NEUTROS PCT: 76 %
PLATELETS: 206 10*3/uL (ref 150–440)
RBC: 2.75 MIL/uL — AB (ref 3.80–5.20)
RDW: 20.6 % — ABNORMAL HIGH (ref 11.5–14.5)
WBC: 9.1 10*3/uL (ref 3.6–11.0)

## 2017-04-25 LAB — HEMOGLOBIN AND HEMATOCRIT, BLOOD
HCT: 24.3 % — ABNORMAL LOW (ref 35.0–47.0)
HEMOGLOBIN: 7.3 g/dL — AB (ref 12.0–16.0)

## 2017-04-25 LAB — PHOSPHORUS: PHOSPHORUS: 3.8 mg/dL (ref 2.5–4.6)

## 2017-04-25 LAB — ABO/RH: ABO/RH(D): B POS

## 2017-04-25 LAB — PREPARE RBC (CROSSMATCH)

## 2017-04-25 MED ORDER — DEXTROSE 50 % IV SOLN: 25.0000 mL | INTRAVENOUS | Status: DC | PRN

## 2017-04-25 MED ORDER — SODIUM CHLORIDE 0.9 % IV SOLN
INTRAVENOUS | Status: DC
  Administered 2017-04-25: 2.1 [IU]/h via INTRAVENOUS
  Filled 2017-04-25: qty 1

## 2017-04-25 MED ORDER — SODIUM CHLORIDE 0.9 % IV SOLN
INTRAVENOUS | Status: DC
  Administered 2017-04-25: 22:00:00 via INTRAVENOUS

## 2017-04-25 MED ORDER — INSULIN REGULAR BOLUS VIA INFUSION
0.0000 [IU] | Freq: Three times a day (TID) | INTRAVENOUS | Status: DC
  Filled 2017-04-25: qty 10

## 2017-04-25 MED ORDER — CHLORHEXIDINE GLUCONATE 0.12% ORAL RINSE (MEDLINE KIT)
15.0000 mL | Freq: Two times a day (BID) | OROMUCOSAL | Status: DC
  Administered 2017-04-25 – 2017-04-28 (×7): 15 mL via OROMUCOSAL

## 2017-04-25 MED ORDER — STERILE WATER FOR INJECTION IJ SOLN
INTRAMUSCULAR | Status: AC
  Administered 2017-04-25: 5 mL
  Filled 2017-04-25: qty 10

## 2017-04-25 MED ORDER — INSULIN ASPART 100 UNIT/ML ~~LOC~~ SOLN
2.0000 [IU] | SUBCUTANEOUS | Status: DC
  Administered 2017-04-25: 4 [IU] via SUBCUTANEOUS
  Administered 2017-04-25: 6 [IU] via SUBCUTANEOUS
  Filled 2017-04-25 (×2): qty 1

## 2017-04-25 MED ORDER — SODIUM CHLORIDE 0.9 % IV SOLN: Freq: Once | INTRAVENOUS | Status: DC

## 2017-04-25 MED ORDER — DEXTROSE-NACL 5-0.45 % IV SOLN
INTRAVENOUS | Status: DC
  Administered 2017-04-26: 04:00:00 via INTRAVENOUS

## 2017-04-25 MED ORDER — ORAL CARE MOUTH RINSE
15.0000 mL | OROMUCOSAL | Status: DC
  Administered 2017-04-25 – 2017-04-29 (×39): 15 mL via OROMUCOSAL

## 2017-04-25 MED ORDER — SENNOSIDES 8.8 MG/5ML PO SYRP
5.0000 mL | ORAL_SOLUTION | Freq: Two times a day (BID) | ORAL | Status: DC
  Administered 2017-04-25 – 2017-04-27 (×5): 5 mL via ORAL
  Filled 2017-04-25 (×6): qty 5

## 2017-04-25 MED ORDER — SODIUM CHLORIDE 0.9% FLUSH
10.0000 mL | INTRAVENOUS | Status: DC | PRN
  Administered 2017-04-30 – 2017-05-01 (×2): 10 mL
  Filled 2017-04-25 (×2): qty 40

## 2017-04-25 MED ORDER — DOCUSATE SODIUM 50 MG/5ML PO LIQD
100.0000 mg | Freq: Two times a day (BID) | ORAL | Status: DC
  Administered 2017-04-25 – 2017-04-27 (×5): 100 mg via ORAL
  Filled 2017-04-25 (×5): qty 10

## 2017-04-25 MED ORDER — SODIUM CHLORIDE 0.9% FLUSH
10.0000 mL | Freq: Two times a day (BID) | INTRAVENOUS | Status: DC
  Administered 2017-04-25 – 2017-04-28 (×8): 10 mL
  Administered 2017-04-29 (×2): 30 mL
  Administered 2017-05-01: 10 mL
  Administered 2017-05-01: 30 mL
  Administered 2017-05-02 – 2017-05-04 (×5): 10 mL

## 2017-04-25 NOTE — Progress Notes (Signed)
Arvil Chaco, NP notified of hgb of 6.0

## 2017-04-25 NOTE — Progress Notes (Signed)
[facility] [place] NAME: Veronica Anderson    MR#:  [id number]  DATE OF BIRTH:  12/27/1945  SUBJECTIVE: Currently intubated, sedated, started on pressors . slightly worsened renal function, hemoglobin is 6. Getting blood transfusion, renal ultrasound also.   CHIEF COMPLAINT:   Chief Complaint  Patient presents with  . Respiratory Distress   -   REVIEW OF SYSTEMS:  Review of Systems  Unable to perform ROS: Intubated  Constitutional: Positive for malaise/fatigue. Negative for chills and fever.  HENT: Negative for ear discharge, hearing loss and nosebleeds.   Respiratory: Positive for shortness of breath. Negative for cough and wheezing.   Cardiovascular: Negative for chest pain and palpitations.  Gastrointestinal: Negative for abdominal pain, constipation, diarrhea, nausea and vomiting.  Genitourinary: Negative for dysuria.  Musculoskeletal: Negative for myalgias and neck pain.  Neurological: Negative for dizziness, sensory change, speech change, focal weakness, seizures and headaches.  Psychiatric/Behavioral: Negative for depression.    DRUG ALLERGIES:   Allergies  Allergen Reactions  . Ace Inhibitors Cough    VITALS:  Blood pressure (!) 141/48, pulse 71, temperature 99.8 F (37.7 C), temperature source Oral, resp. rate 15, height 5\' 5"  (1.651 m), weight (!) 137.3 kg (302 lb 11.1 oz), SpO2 100 %.  PHYSICAL EXAMINATION:  Physical Exam  GENERAL:  71 y.o.-year-old obese patient lying in the bed , Currently intubated, sedated. EYES: Pupils equal, round, reactive to light.. No scleral icterus. HEENT: Head atraumatic, normocephalic. Orally intubated. NECK:  Supple, no jugular venous distention. No thyroid enlargement, no tenderness.  LUNGS: Diminished breath sounds bilaterally. CARDIOVASCULAR: S1, S2 normal. No murmurs, rubs, or gallops.  ABDOMEN: Soft, nontender, nondistended. Bowel sounds present. No organomegaly or mass.    EXTREMITIES: No pedal edema, cyanosis, or clubbing.  NEUROLOGIC: Cranial nerves II through XII are intact. Muscle strength 5/5 in all extremities. Sensation intact. Gait not checked.  PSYCHIATRIC: The patient is alert and oriented x 3.  SKIN: No obvious rash, lesion, or ulcer.    LABORATORY PANEL:   CBC  Recent Labs Lab 04/25/17 0515  WBC 9.1  HGB 6.0*  HCT 19.8*  PLT 206   ------------------------------------------------------------------------------------------------------------------  Chemistries   Recent Labs Lab 04/19/17 0542  04/24/17 0608 04/25/17 0849  NA 145  < > 149* 144  K 3.1*  < > 3.1* 4.1  CL 101  < > 99* 97*  CO2 32  < > 38* 36*  GLUCOSE 205*  < > 74 226*  BUN 24*  < > 30* 44*  CREATININE 1.86*  < > 1.65* 2.23*  CALCIUM 8.6*  < > 8.3* 7.7*  MG  --   < > 2.0  --   AST 13*  --   --   --   ALT 9*  --   --   --   ALKPHOS 98  --   --   --   BILITOT 1.0  --   --   --   < > = values in this interval not displayed. ------------------------------------------------------------------------------------------------------------------  Cardiac Enzymes  Recent Labs Lab 04/19/17 0542  TROPONINI 0.03*   ------------------------------------------------------------------------------------------------------------------  RADIOLOGY:  Dg Chest 1 View  Result Date: 04/23/2017 CLINICAL DATA:  Endotracheal tube and central venous catheter placement EXAM: CHEST 1 VIEW COMPARISON:  Chest radiograph 04/23/2017 FINDINGS: Endotracheal tube tip is about 3 cm above the inferior margin of the carina. This could be retracted by approximately 3 cm for optimal positioning. The right IJ central venous  catheter is at the cavoatrial junction. Orogastric tube courses below the diaphragm. Limited assessment of the lungs due to beam attenuation by patient body habitus. No large area of consolidation. Suspected mild pulmonary edema. IMPRESSION: ET tube tip 3 cm above the inferior margin  of the carina. Right IJ CVC at the cavoatrial junction. Electronically Signed   By: Ulyses Jarred M.D.   On: 04/23/2017 19:58   Dg Chest 1 View  Result Date: 04/23/2017 CLINICAL DATA:  Shortness of breath, CHF EXAM: CHEST 1 VIEW COMPARISON:  04/19/2017 FINDINGS: There is bilateral diffuse mild interstitial thickening. There is no focal consolidation. There is no pleural effusion or pneumothorax. There is stable cardiomegaly. The osseous structures are unremarkable. IMPRESSION: Mild CHF. Electronically Signed   By: Kathreen Devoid   On: 04/23/2017 14:49   Dg Abd 1 View  Result Date: 04/23/2017 CLINICAL DATA:  71 year old female enteric tube placement. EXAM: ABDOMEN - 1 VIEW COMPARISON:  Portable chest 1914 hours today. FINDINGS: Portable AP semi upright view at 1914 hours. Enteric tube is visible coursing into the abdomen over midline from the mediastinum. The tip and side hole are not clearly identified. Large body habitus with paucity of bowel gas in the visible abdomen. IMPRESSION: The enteric tube courses into the abdomen over midline. The tip and/or side-hole are not clearly delineated but I suspect the tube terminates in the stomach. Electronically Signed   By: Genevie Ann M.D.   On: 04/23/2017 21:37   Ct Chest Wo Contrast  Result Date: 04/24/2017 CLINICAL DATA:  Respiratory failure EXAM: CT CHEST WITHOUT CONTRAST TECHNIQUE: Multidetector CT imaging of the chest was performed following the standard protocol without IV contrast. COMPARISON:  Chest radiographs dated 04/23/2017 FINDINGS: Cardiovascular: The heart is normal in size. No pericardial effusion. No evidence of thoracic aortic aneurysm. Atherosclerotic calcifications aortic arch. Three vessel coronary atherosclerosis. Right chest port terminating at the cavoatrial junction. Mediastinum/Nodes: No suspicious mediastinal lymphadenopathy. Visualized thyroid is unremarkable. Lungs/Pleura: Endotracheal tube terminates 6 cm above the carina. Patchy left  lower lobe opacity (series 3/image 97), suspicious for pneumonia. Additional mild patchy opacity in the posterior right upper lobe and right middle lobe, atelectasis versus pneumonia. Mosaic attenuation, suggesting air trapping or small airways disease. Moderate bilateral pleural effusions.  No pneumothorax. Upper Abdomen: Visualized upper abdomen is grossly unremarkable, noting an enteric tube coursing into the mid stomach. Musculoskeletal: Mild degenerative changes of the visualized thoracolumbar spine. IMPRESSION: Patchy left lower lobe opacity, suspicious for pneumonia. Additional mild patchy opacity in the posterior right upper lobe and right middle lobe, atelectasis versus pneumonia. Moderate bilateral pleural effusions. Endotracheal tube terminates 6 cm above the carina. Aortic Atherosclerosis (ICD10-I70.0). Electronically Signed   By: Julian Hy M.D.   On: 04/24/2017 12:19   Dg Chest Port 1 View  Result Date: 04/25/2017 CLINICAL DATA:  Pneumonia. EXAM: PORTABLE CHEST 1 VIEW COMPARISON:  Radiograph of April 23, 2017. FINDINGS: Stable cardiomegaly. Stable position of endotracheal tube. Nasogastric tube is seen entering stomach. Right internal jugular catheter is unchanged. No pneumothorax is noted. Increased bilateral perihilar and basilar opacities are noted concerning for worsening edema or inflammation. Bony thorax is unremarkable. IMPRESSION: Stable support apparatus. Increased bilateral lung opacities concerning for worsening edema or inflammation. Electronically Signed   By: Marijo Conception, M.D.   On: 04/25/2017 07:54    EKG:   Orders placed or performed during the hospital encounter of 04/19/17  . ED EKG  . ED EKG  . EKG 12-Lead  . EKG  12-Lead  . EKG    ASSESSMENT AND PLAN:   71 year old female, morbidly obese with past medical history significant for chronic diastolic heart failure, hypertension, diabetes and CK D stage III presents to hospital secondary to worsening  shortness of breath.  * Acute on chronic hypoxic respiratory failure' Worsening respiratory failure last night, currently intubated, sedated; CT chest this morning showed a left lower lobe pneumonia, patchy opacity in the right upper lobe and right middle lobe. Patient has multifocal pneumonia. Add IV antibiotics. Wean off as tolerated.  . Nephrology is consulted for hypernatremia, hypokalemia and hyppernatremia, hypokalemia improving at this time.,: -  -#2 acute on chronic chronic kidney disease stage III with baseline GFR 35: Not a candidate for hemodialysis at this time.   Acute renal failure likely due to hypotension: Check renal ultrasound today point.monitor UP  * Hypertension.  Metoprolol and clonidine Also on hydralazine prn  * Insulin-dependent diabetes mellitus. ON lantus. Sliding scale insulin.  * CKD stage III is stable.  Monitor while on diuretics  * Acute anemia with hemoglobin of 6. Check stool for guaiacs, continue PPIs, blood transfusion 1 unit of packed RBC today, consult GI if patient continues to drop in hemoglobin. All the records are reviewed and case discussed with Care Management/Social Workerr. Management plans discussed with the patient, family and they are in agreement.  CODE STATUS: Full Code  TOTAL TIME TAKING CARE OF THIS PATIENT: 36 minutes.   POSSIBLE D/C IN 2-3 DAYS, DEPENDING ON CLINICAL CONDITION.   Epifanio Lesches M.D on 04/25/2017 at 1:07 PM  Between 7am to 6pm - Pager - [phone]  After 6pm go to [email or web address] - password [email or web address] [facility]  Office  [phone]  CC: Primary care physician; Hermelinda Dellen, DO

## 2017-04-25 NOTE — Progress Notes (Signed)
MEDICATION RELATED CONSULT NOTE - INITIAL   Pharmacy Consult for electrolyte management   Pharmacy consulted for electrolyte management for 71 yo female admitted with CHF exacerbation.   Plan:  Electrolytes WNL, patient in acute renal failure. Diuretics and potassium supplementation discontinued. No changes at this time. Recheck BMP with AM labs  Allergies  Allergen Reactions  . Ace Inhibitors Cough    Patient Measurements: Height: 5\' 5"  (165.1 cm) Weight: (!) 302 lb 11.1 oz (137.3 kg) IBW/kg (Calculated) : 57   Vital Signs: Temp: 100.7 F (38.2 C) (10/13 0800) Temp Source: Oral (10/13 0800) BP: 117/48 (10/13 0930) Pulse Rate: 74 (10/13 0930) Intake/Output from previous day: 10/12 0701 - 10/13 0700 In: 1159 [I.V.:463.6; NG/GT:695.3] Out: 1450 [Urine:1450] Intake/Output from this shift: Total I/O In: 202.5 [I.V.:82.5; NG/GT:120] Out: 150 [Urine:150]  Labs:  Recent Labs  04/23/17 1211 04/24/17 0608 04/25/17 0515 04/25/17 0849  WBC  --   --  9.1  --   HGB  --   --  6.0*  --   HCT  --   --  19.8*  --   PLT  --   --  206  --   CREATININE 1.68* 1.65*  --  2.23*  MG 2.2 2.0  --   --   PHOS  --  1.6*  --  3.8   Estimated Creatinine Clearance: 32.5 mL/min (A) (by C-G formula based on SCr of 2.23 mg/dL (H)).   Medical History: Past Medical History:  Diagnosis Date  . Allergy   . Anemia   . Asthma   . Cataract    bilateral  . CHF (NYHA class II, ACC/AHA stage C) ([place])   . Chronic kidney disease    stage IV (severe), Dr. Aleene Davidson  . Constipation   . Diabetes mellitus without complication ([place])   . Edema    feet/ankles  . GERD (gastroesophageal reflux disease)   . Hyperlipidemia   . Hypertension   . Left ankle pain   . Lumbago   . Myocardial infarction ([place])   . Neuropathy   . Neuropathy   . OA (osteoarthritis)   . Obesity   . Obstructive sleep apnea syndrome    CPAP  . Paresthesia    Foot  . Protein calorie malnutrition ([place])   . Requires  supplemental oxygen   . Stroke ([place])   . Vitamin D deficiency     Pharmacy will continue to monitor and adjust per consult.   Lorri Fukuhara C 04/25/2017,10:37 AM

## 2017-04-25 NOTE — Progress Notes (Signed)
[facility]  ROUNDING NOTE   Subjective:  Patient seen at bedside. Creatinine a bit higher at 2.2 today. Hemoglobin also quite low at 6.0.   Objective:  Vital signs in last 24 hours:  Temp:  [99.1 F (37.3 C)-100.7 F (38.2 C)] 100.7 F (38.2 C) (10/13 0800) Pulse Rate:  [57-86] 74 (10/13 0930) Resp:  [0-24] 15 (10/13 0930) BP: (81-163)/(36-81) 117/48 (10/13 0930) SpO2:  [92 %-100 %] 100 % (10/13 0930) FiO2 (%):  [40 %] 40 % (10/13 0900)  Weight change:  Filed Weights   04/22/17 0500 04/23/17 0500 04/24/17 0329  Weight: (!) 140.6 kg (309 lb 15.5 oz) (!) 140.2 kg (309 lb 1.4 oz) (!) 137.3 kg (302 lb 11.1 oz)    Intake/Output: I/O last 3 completed shifts: In: 1419.7 [I.V.:624.4; NG/GT:795.3] Out: 2650 [Urine:2650]   Intake/Output this shift:  Total I/O In: 202.5 [I.V.:82.5; NG/GT:120] Out: 150 [Urine:150]  Physical Exam: General: criticall ill appearing  Head: Normocephalic, atraumatic. ETT in place  Eyes: Anicteric  Neck: Supple, trachea midline  Lungs:  Scattered rhonchi, vent assisted  Heart: S1S2 no rubs  Abdomen:  Soft, nontender, bowel sounds present  Extremities: 1+ peripheral edema.  Neurologic: On sedation but is arousable  Skin: No lesions       Basic Metabolic Panel:  Recent Labs Lab 04/19/17 2014  04/21/17 0519 04/22/17 0539 04/23/17 1211 04/24/17 0608 04/25/17 0849  NA 148*  < > 148* 148* 149* 149* 144  K 3.3*  < > 3.7 3.6 4.5 3.1* 4.1  CL 101  < > 101 98* 99* 99* 97*  CO2 35*  < > 38* 41* 36* 38* 36*  GLUCOSE 158*  < > 126* 109* 134* 74 226*  BUN 23*  < > 26* 29* 29* 30* 44*  CREATININE 1.74*  < > 1.84* 1.77* 1.68* 1.65* 2.23*  CALCIUM 8.5*  < > 8.4* 8.3* 8.5* 8.3* 7.7*  MG 1.9  --  1.9 1.7 2.2 2.0  --   PHOS 3.9  --   --   --   --  1.6* 3.8  < > = values in this interval not displayed.  Liver Function Tests:  Recent Labs Lab 04/19/17 0542  AST 13*  ALT 9*  ALKPHOS 98  BILITOT 1.0  PROT 7.9  ALBUMIN 3.1*     Recent Labs Lab 04/19/17 0542  LIPASE 15   No results for input(s): AMMONIA in the last 168 hours.  CBC:  Recent Labs Lab 04/19/17 0542 04/19/17 2014 04/20/17 0441 04/21/17 0519 04/25/17 0515  WBC 16.1* 10.5 10.7 6.0 9.1  NEUTROABS 14.6*  --   --  4.7 6.9*  HGB 7.9* 7.8* 7.2* 7.6* 6.0*  HCT 26.0* 25.6* 23.1* 25.0* 19.8*  MCV 69.1* 70.8* 69.9* 71.6* 72.0*  PLT 321 248 235 213 206    Cardiac Enzymes:  Recent Labs Lab 04/19/17 0542  TROPONINI 0.03*    BNP: Invalid input(s): POCBNP  CBG:  Recent Labs Lab 04/24/17 1623 04/24/17 1929 04/24/17 2214 04/25/17 0006 04/25/17 0809  GLUCAP 121* 113* 85* 145* 57*    Microbiology: Results for orders placed or performed during the hospital encounter of 04/19/17  MRSA PCR Screening     Status: Abnormal   Collection Time: 04/19/17 10:07 AM  Result Value Ref Range Status   MRSA by PCR POSITIVE (A) NEGATIVE Final    Comment:        The GeneXpert MRSA Assay (FDA approved for NASAL specimens only), is one component of  a comprehensive MRSA colonization surveillance program. It is not intended to diagnose MRSA infection nor to guide or monitor treatment for MRSA infections. RESULT CALLED TO, READ BACK BY AND VERIFIED WITH: SANDRA BOREA ON 04/19/17 AT 1228 [facility]     Coagulation Studies: No results for input(s): LABPROT, INR in the last 72 hours.  Urinalysis: No results for input(s): COLORURINE, LABSPEC, PHURINE, GLUCOSEU, HGBUR, BILIRUBINUR, KETONESUR, PROTEINUR, UROBILINOGEN, NITRITE, LEUKOCYTESUR in the last 72 hours.  Invalid input(s): APPERANCEUR    Imaging: Dg Chest 1 View  Result Date: 04/23/2017 CLINICAL DATA:  Endotracheal tube and central venous catheter placement EXAM: CHEST 1 VIEW COMPARISON:  Chest radiograph 04/23/2017 FINDINGS: Endotracheal tube tip is about 3 cm above the inferior margin of the carina. This could be retracted by approximately 3 cm for optimal positioning. The right IJ  central venous catheter is at the cavoatrial junction. Orogastric tube courses below the diaphragm. Limited assessment of the lungs due to beam attenuation by patient body habitus. No large area of consolidation. Suspected mild pulmonary edema. IMPRESSION: ET tube tip 3 cm above the inferior margin of the carina. Right IJ CVC at the cavoatrial junction. Electronically Signed   By: Ulyses Jarred M.D.   On: 04/23/2017 19:58   Dg Chest 1 View  Result Date: 04/23/2017 CLINICAL DATA:  Shortness of breath, CHF EXAM: CHEST 1 VIEW COMPARISON:  04/19/2017 FINDINGS: There is bilateral diffuse mild interstitial thickening. There is no focal consolidation. There is no pleural effusion or pneumothorax. There is stable cardiomegaly. The osseous structures are unremarkable. IMPRESSION: Mild CHF. Electronically Signed   By: Kathreen Devoid   On: 04/23/2017 14:49   Dg Abd 1 View  Result Date: 04/23/2017 CLINICAL DATA:  71 year old female enteric tube placement. EXAM: ABDOMEN - 1 VIEW COMPARISON:  Portable chest 1914 hours today. FINDINGS: Portable AP semi upright view at 1914 hours. Enteric tube is visible coursing into the abdomen over midline from the mediastinum. The tip and side hole are not clearly identified. Large body habitus with paucity of bowel gas in the visible abdomen. IMPRESSION: The enteric tube courses into the abdomen over midline. The tip and/or side-hole are not clearly delineated but I suspect the tube terminates in the stomach. Electronically Signed   By: Genevie Ann M.D.   On: 04/23/2017 21:37   Ct Chest Wo Contrast  Result Date: 04/24/2017 CLINICAL DATA:  Respiratory failure EXAM: CT CHEST WITHOUT CONTRAST TECHNIQUE: Multidetector CT imaging of the chest was performed following the standard protocol without IV contrast. COMPARISON:  Chest radiographs dated 04/23/2017 FINDINGS: Cardiovascular: The heart is normal in size. No pericardial effusion. No evidence of thoracic aortic aneurysm. Atherosclerotic  calcifications aortic arch. Three vessel coronary atherosclerosis. Right chest port terminating at the cavoatrial junction. Mediastinum/Nodes: No suspicious mediastinal lymphadenopathy. Visualized thyroid is unremarkable. Lungs/Pleura: Endotracheal tube terminates 6 cm above the carina. Patchy left lower lobe opacity (series 3/image 97), suspicious for pneumonia. Additional mild patchy opacity in the posterior right upper lobe and right middle lobe, atelectasis versus pneumonia. Mosaic attenuation, suggesting air trapping or small airways disease. Moderate bilateral pleural effusions.  No pneumothorax. Upper Abdomen: Visualized upper abdomen is grossly unremarkable, noting an enteric tube coursing into the mid stomach. Musculoskeletal: Mild degenerative changes of the visualized thoracolumbar spine. IMPRESSION: Patchy left lower lobe opacity, suspicious for pneumonia. Additional mild patchy opacity in the posterior right upper lobe and right middle lobe, atelectasis versus pneumonia. Moderate bilateral pleural effusions. Endotracheal tube terminates 6 cm above the carina. Aortic Atherosclerosis (  ICD10-I70.0). Electronically Signed   By: Julian Hy M.D.   On: 04/24/2017 12:19   Dg Chest Port 1 View  Result Date: 04/25/2017 CLINICAL DATA:  Pneumonia. EXAM: PORTABLE CHEST 1 VIEW COMPARISON:  Radiograph of April 23, 2017. FINDINGS: Stable cardiomegaly. Stable position of endotracheal tube. Nasogastric tube is seen entering stomach. Right internal jugular catheter is unchanged. No pneumothorax is noted. Increased bilateral perihilar and basilar opacities are noted concerning for worsening edema or inflammation. Bony thorax is unremarkable. IMPRESSION: Stable support apparatus. Increased bilateral lung opacities concerning for worsening edema or inflammation. Electronically Signed   By: Marijo Conception, M.D.   On: 04/25/2017 07:54     Medications:   . sodium chloride    . fentaNYL infusion INTRAVENOUS  75 mcg/hr (04/25/17 0900)  . norepinephrine (LEVOPHED) Adult infusion 1 mcg/min (04/25/17 0905)   . aspirin EC  81 mg Oral QHS  . chlorhexidine gluconate (MEDLINE KIT)  15 mL Mouth Rinse BID  . cloNIDine  0.1 mg Transdermal Weekly  . docusate  100 mg Oral BID  . enoxaparin (LOVENOX) injection  40 mg Subcutaneous Q12H  . feeding supplement (PRO-STAT SUGAR FREE 64)  60 mL Per Tube BID  . feeding supplement (VITAL HIGH PROTEIN)  1,000 mL Per Tube Q24H  . gabapentin  300 mg Oral TID  . insulin aspart  2-6 Units Subcutaneous Q4H  . insulin glargine  30 Units Subcutaneous Q2200  . ipratropium-albuterol  3 mL Nebulization Q6H  . mouth rinse  15 mL Mouth Rinse 10 times per day  . metoprolol tartrate  100 mg Oral BID  . montelukast  10 mg Oral Daily  . pravastatin  40 mg Oral q1800  . sennosides  5 mL Oral BID  . sodium chloride flush  10-40 mL Intracatheter Q12H  . sodium chloride flush  3 mL Intravenous Q12H  . sucralfate  1 g Per Tube TID   acetaminophen **OR** acetaminophen, fentaNYL, hydrALAZINE, ipratropium-albuterol, midazolam, midazolam, ondansetron (ZOFRAN) IV, polyethylene glycol, sodium chloride flush  Assessment/ Plan:  71 y.o. female with a PMHx of anemia of chronic kidney disease, congestive heart failure, chronic kidney disease stage III baseline EGFR 35, diabetes mellitus type 2, peripheral edema, GERD, hypertension, hyperlipidemia, peripheral neuropathy, obesity, history of CVA, obstructive sleep apnea,who was admitted to [facility] on 04/19/2017 for evaluation of respiratory distress.  1.  Chronic kidney disease stage III baseline EGFR 35/Acute renal failure.  2.  Acute respiratory failure. 3.  Acute on chronic diastolic heart failure. 4.  Hypernatremia. 5.  Hypokalemia.  Plan:  Renal function was worse today. Patient was hypotensive and hemoglobin has dropped significantly to 6.0. Suspect that acute renal failure is related to this. We will check renal ultrasound to make sure  there is no underlying hydronephrosis. No urgent indication for dialysis at the moment as urine output was good over the preceding 24 hours. Consider blood transfusion but defer this to pulmonary/critical care. Otherwise continue supportive care.   LOS: 6 Natally Ribera 10/13/201810:05 AM

## 2017-04-26 ENCOUNTER — Inpatient Hospital Stay: Payer: Medicare Other

## 2017-04-26 DIAGNOSIS — I5023 Acute on chronic systolic (congestive) heart failure: Secondary | ICD-10-CM

## 2017-04-26 LAB — BASIC METABOLIC PANEL
Anion gap: 10 (ref 5–15)
Anion gap: 11 (ref 5–15)
BUN: 59 mg/dL — AB (ref 6–20)
BUN: 63 mg/dL — AB (ref 6–20)
CALCIUM: 7.6 mg/dL — AB (ref 8.9–10.3)
CHLORIDE: 98 mmol/L — AB (ref 101–111)
CO2: 36 mmol/L — AB (ref 22–32)
CO2: 37 mmol/L — ABNORMAL HIGH (ref 22–32)
CREATININE: 2.16 mg/dL — AB (ref 0.44–1.00)
CREATININE: 2.08 mg/dL — AB (ref 0.44–1.00)
Calcium: 7.8 mg/dL — ABNORMAL LOW (ref 8.9–10.3)
Chloride: 98 mmol/L — ABNORMAL LOW (ref 101–111)
GFR calc Af Amer: 26 mL/min — ABNORMAL LOW (ref >60)
GFR calc non Af Amer: 22 mL/min — ABNORMAL LOW (ref >60)
GFR, EST AFRICAN AMERICAN: 25 mL/min — AB (ref >60)
GFR, EST NON AFRICAN AMERICAN: 23 mL/min — AB (ref >60)
GLUCOSE: 180 mg/dL — AB (ref 65–99)
Glucose, Bld: 182 mg/dL — ABNORMAL HIGH (ref 65–99)
Potassium: 3.4 mmol/L — ABNORMAL LOW (ref 3.5–5.1)
Potassium: 3.8 mmol/L (ref 3.5–5.1)
SODIUM: 146 mmol/L — AB (ref 135–145)
Sodium: 144 mmol/L (ref 135–145)

## 2017-04-26 LAB — GLUCOSE, CAPILLARY
GLUCOSE-CAPILLARY: 183 mg/dL — AB (ref 65–99)
GLUCOSE-CAPILLARY: 166 mg/dL — AB (ref 65–99)
GLUCOSE-CAPILLARY: 160 mg/dL — AB (ref 65–99)
GLUCOSE-CAPILLARY: 135 mg/dL — AB (ref 65–99)
GLUCOSE-CAPILLARY: 137 mg/dL — AB (ref 65–99)
GLUCOSE-CAPILLARY: 142 mg/dL — AB (ref 65–99)
GLUCOSE-CAPILLARY: 201 mg/dL — AB (ref 65–99)
GLUCOSE-CAPILLARY: 156 mg/dL — AB (ref 65–99)
GLUCOSE-CAPILLARY: 165 mg/dL — AB (ref 65–99)
GLUCOSE-CAPILLARY: 147 mg/dL — AB (ref 65–99)
Glucose-Capillary: 126 mg/dL — ABNORMAL HIGH (ref 65–99)
Glucose-Capillary: 142 mg/dL — ABNORMAL HIGH (ref 65–99)
Glucose-Capillary: 158 mg/dL — ABNORMAL HIGH (ref 65–99)
Glucose-Capillary: 165 mg/dL — ABNORMAL HIGH (ref 65–99)
Glucose-Capillary: 165 mg/dL — ABNORMAL HIGH (ref 65–99)
Glucose-Capillary: 167 mg/dL — ABNORMAL HIGH (ref 65–99)
Glucose-Capillary: 171 mg/dL — ABNORMAL HIGH (ref 65–99)
Glucose-Capillary: 182 mg/dL — ABNORMAL HIGH (ref 65–99)
Glucose-Capillary: 191 mg/dL — ABNORMAL HIGH (ref 65–99)
Glucose-Capillary: 208 mg/dL — ABNORMAL HIGH (ref 65–99)
Glucose-Capillary: 240 mg/dL — ABNORMAL HIGH (ref 65–99)
Glucose-Capillary: 254 mg/dL — ABNORMAL HIGH (ref 65–99)
Glucose-Capillary: 292 mg/dL — ABNORMAL HIGH (ref 65–99)

## 2017-04-26 LAB — CBC WITH DIFFERENTIAL/PLATELET
Basophils Absolute: 0 10*3/uL (ref 0–0.1)
Basophils Relative: 0 %
EOS ABS: 0.2 10*3/uL (ref 0–0.7)
EOS PCT: 2 %
HCT: 22.8 % — ABNORMAL LOW (ref 35.0–47.0)
Hemoglobin: 6.8 g/dL — ABNORMAL LOW (ref 12.0–16.0)
LYMPHS ABS: 1 10*3/uL (ref 1.0–3.6)
Lymphocytes Relative: 9 %
MCH: 21.8 pg — AB (ref 26.0–34.0)
MCHC: 29.8 g/dL — AB (ref 32.0–36.0)
MCV: 73.3 fL — ABNORMAL LOW (ref 80.0–100.0)
MONOS PCT: 11 %
Monocytes Absolute: 1.2 10*3/uL — ABNORMAL HIGH (ref 0.2–0.9)
Neutro Abs: 9.1 10*3/uL — ABNORMAL HIGH (ref 1.4–6.5)
Neutrophils Relative %: 78 %
PLATELETS: 221 10*3/uL (ref 150–440)
RBC: 3.11 MIL/uL — ABNORMAL LOW (ref 3.80–5.20)
RDW: 21.5 % — ABNORMAL HIGH (ref 11.5–14.5)
WBC: 11.6 10*3/uL — AB (ref 3.6–11.0)

## 2017-04-26 LAB — CBC
HEMATOCRIT: 22.4 % — AB (ref 35.0–47.0)
Hemoglobin: 6.6 g/dL — ABNORMAL LOW (ref 12.0–16.0)
MCH: 21.8 pg — ABNORMAL LOW (ref 26.0–34.0)
MCHC: 29.4 g/dL — AB (ref 32.0–36.0)
MCV: 73.9 fL — ABNORMAL LOW (ref 80.0–100.0)
Platelets: 221 10*3/uL (ref 150–440)
RBC: 3.03 MIL/uL — ABNORMAL LOW (ref 3.80–5.20)
RDW: 21.6 % — ABNORMAL HIGH (ref 11.5–14.5)
WBC: 11.9 10*3/uL — ABNORMAL HIGH (ref 3.6–11.0)

## 2017-04-26 LAB — MAGNESIUM: MAGNESIUM: 2.1 mg/dL (ref 1.7–2.4)

## 2017-04-26 LAB — PHOSPHORUS: Phosphorus: 3 mg/dL (ref 2.5–4.6)

## 2017-04-26 MED ORDER — PIPERACILLIN-TAZOBACTAM 3.375 G IVPB
3.3750 g | Freq: Three times a day (TID) | INTRAVENOUS | Status: DC
  Administered 2017-04-26: 3.375 g via INTRAVENOUS
  Filled 2017-04-26: qty 50

## 2017-04-26 MED ORDER — POTASSIUM CHLORIDE 20 MEQ/15ML (10%) PO SOLN
20.0000 meq | Freq: Once | ORAL | Status: AC
  Administered 2017-04-26: 20 meq via ORAL
  Filled 2017-04-26: qty 15

## 2017-04-26 MED ORDER — VANCOMYCIN HCL IN DEXTROSE 1-5 GM/200ML-% IV SOLN
1000.0000 mg | INTRAVENOUS | Status: DC
  Administered 2017-04-27: 1000 mg via INTRAVENOUS
  Filled 2017-04-26: qty 200

## 2017-04-26 MED ORDER — SODIUM CHLORIDE 0.9 % IV SOLN
INTRAVENOUS | Status: AC
  Administered 2017-04-26: 7.4 [IU]/h via INTRAVENOUS
  Administered 2017-04-27: 4.1 [IU]/h via INTRAVENOUS
  Filled 2017-04-26 (×2): qty 1

## 2017-04-26 MED ORDER — PIPERACILLIN-TAZOBACTAM 4.5 G IVPB
4.5000 g | Freq: Three times a day (TID) | INTRAVENOUS | Status: DC
  Filled 2017-04-26 (×3): qty 100

## 2017-04-26 MED ORDER — POTASSIUM CHLORIDE CRYS ER 20 MEQ PO TBCR: 20.0000 meq | EXTENDED_RELEASE_TABLET | Freq: Once | ORAL | Status: DC

## 2017-04-26 MED ORDER — VANCOMYCIN HCL 10 G IV SOLR
1500.0000 mg | Freq: Once | INTRAVENOUS | Status: AC
  Administered 2017-04-26: 1500 mg via INTRAVENOUS
  Filled 2017-04-26: qty 1500

## 2017-04-26 MED ORDER — PIPERACILLIN-TAZOBACTAM 4.5 G IVPB
4.5000 g | Freq: Three times a day (TID) | INTRAVENOUS | Status: DC
  Administered 2017-04-26 – 2017-04-27 (×2): 4.5 g via INTRAVENOUS
  Filled 2017-04-26 (×7): qty 100

## 2017-04-26 NOTE — Progress Notes (Signed)
Position of ET noted to be at 21cm at the lip at this time. Initially tube was placed at 23cm per intubation documentation with xray interpretation recommending to retract/pull back tube 3cm for optimal positioning 04/23/2017. Et tube is now noted as stated at  21cm since last xray 04/25/2017 which indicated tube was in stable position. So tube is now 2 cm back from initial placement. Patient with good spontaneous volumes 400-500s. Vitals remain stable. Patient is alert and responding to my questions appropriately.  Issue with tube placement has been discussed with nursing staff.

## 2017-04-26 NOTE — Progress Notes (Signed)
MEDICATION RELATED CONSULT NOTE - INITIAL   Pharmacy Consult for electrolyte management   Pharmacy consulted for electrolyte management for 71 yo female admitted with CHF exacerbation.   Plan:  K: 3.4, will give KCl 39mEq Po x 1, recheck electrolytes with AM labs.   Allergies  Allergen Reactions  . Ace Inhibitors Cough    Patient Measurements: Height: 5\' 5"  (165.1 cm) Weight: (!) 310 lb 10.1 oz (140.9 kg) IBW/kg (Calculated) : 57   Vital Signs: Temp: 99.4 F (37.4 C) (10/14 0400) Temp Source: Oral (10/14 0400) BP: 142/39 (10/14 0700) Pulse Rate: 79 (10/14 0700) Intake/Output from previous day: 10/13 0701 - 10/14 0700 In: 1702.2 [I.V.:397.2; Blood:305; NG/GT:1000] Out: 1075 [Urine:1075] Intake/Output from this shift: No intake/output data recorded.  Labs:  Recent Labs  04/23/17 1211 04/24/17 0608 04/25/17 0515 04/25/17 0849 04/25/17 1455 04/26/17 0542  WBC  --   --  9.1  --   --   --   HGB  --   --  6.0*  --  7.3*  --   HCT  --   --  19.8*  --  24.3*  --   PLT  --   --  206  --   --   --   CREATININE 1.68* 1.65*  --  2.23*  --  2.16*  MG 2.2 2.0  --   --   --   --   PHOS  --  1.6*  --  3.8  --   --    Estimated Creatinine Clearance: 34.2 mL/min (A) (by C-G formula based on SCr of 2.16 mg/dL (H)).   Pharmacy will continue to monitor and adjust per consult.   Faizan Geraci C 04/26/2017,8:21 AM

## 2017-04-26 NOTE — Progress Notes (Signed)
Pharmacy Antibiotic Note  Veronica Anderson is a 71 y.o. female admitted on 04/19/2017 with pneumonia.  Pharmacy has been consulted for zosyn and vancomycin dosing.  Plan: Vancomycin 1000mg  IV every 24 hours.  Goal trough 15-20 mcg/mL. Zosyn 3.375g IV q8h (4 hour infusion).    Ke 0.033, t1/2 21hrs, stack dose 20 hrs after 1500mg  dose.    Height: 5\' 5"  (165.1 cm) Weight: (!) 310 lb 10.1 oz (140.9 kg) IBW/kg (Calculated) : 57  Temp (24hrs), Avg:99.4 F (37.4 C), Min:98.9 F (37.2 C), Max:101.1 F (38.4 C)   Recent Labs Lab 04/19/17 2014 04/20/17 0441 04/21/17 0519  04/23/17 1211 04/24/17 0608 04/25/17 0515 04/25/17 0849 04/26/17 0542 04/26/17 1058  WBC 10.5 10.7 6.0  --   --   --  9.1  --   --  11.6*  CREATININE 1.74* 1.55* 1.84*  < > 1.68* 1.65*  --  2.23* 2.16* 2.08*  < > = values in this interval not displayed.  Estimated Creatinine Clearance: 35.5 mL/min (A) (by C-G formula based on SCr of 2.08 mg/dL (H)).    Allergies  Allergen Reactions  . Ace Inhibitors Cough    Antimicrobials this admission: Anti-infectives    Start     Dose/Rate Route Frequency Ordered Stop   04/27/17 1000  vancomycin (VANCOCIN) IVPB 1000 mg/200 mL premix     1,000 mg 200 mL/hr over 60 Minutes Intravenous Every 24 hours 04/26/17 1325     04/26/17 1400  piperacillin-tazobactam (ZOSYN) IVPB 3.375 g     3.375 g 12.5 mL/hr over 240 Minutes Intravenous Every 8 hours 04/26/17 1321     04/26/17 1330  vancomycin (VANCOCIN) 1,500 mg in sodium chloride 0.9 % 500 mL IVPB     1,500 mg 250 mL/hr over 120 Minutes Intravenous  Once 04/26/17 1322         Microbiology results: Recent Results (from the past 240 hour(s))  MRSA PCR Screening     Status: Abnormal   Collection Time: 04/19/17 10:07 AM  Result Value Ref Range Status   MRSA by PCR POSITIVE (A) NEGATIVE Final    Comment:        The GeneXpert MRSA Assay (FDA approved for NASAL specimens only), is one component of a comprehensive MRSA  colonization surveillance program. It is not intended to diagnose MRSA infection nor to guide or monitor treatment for MRSA infections. RESULT CALLED TO, READ BACK BY AND VERIFIED WITH: SANDRA BOREA ON 04/19/17 AT 1228 [facility]     Thank you for allowing pharmacy to be a part of this patient's care.  Donna Christen Alonso Gapinski 04/26/2017 1:25 PM

## 2017-04-26 NOTE — Progress Notes (Addendum)
[facility] [place] NAME: Veronica Anderson    MR#:  [id number]  DATE OF BIRTH:  1945-10-11  SUBJECTIVE: spiked fever up to 101 Fahrenheit. Still on ventilator with pressors., Insulin drip. 11 dropped again to 6.8.   CHIEF COMPLAINT:   Chief Complaint  Patient presents with  . Respiratory Distress   -   REVIEW OF SYSTEMS:  Review of Systems  Unable to perform ROS: Intubated  Constitutional: Positive for malaise/fatigue. Negative for chills and fever.  HENT: Negative for ear discharge, hearing loss and nosebleeds.   Respiratory: Positive for shortness of breath. Negative for cough and wheezing.   Cardiovascular: Negative for chest pain and palpitations.  Gastrointestinal: Negative for abdominal pain, constipation, diarrhea, nausea and vomiting.  Genitourinary: Negative for dysuria.  Musculoskeletal: Negative for myalgias and neck pain.  Neurological: Negative for dizziness, sensory change, speech change, focal weakness, seizures and headaches.  Psychiatric/Behavioral: Negative for depression.    DRUG ALLERGIES:   Allergies  Allergen Reactions  . Ace Inhibitors Cough    VITALS:  Blood pressure (!) 154/49, pulse 77, temperature (!) 101.1 F (38.4 C), temperature source Oral, resp. rate 15, height 5\' 5"  (1.651 m), weight (!) 140.9 kg (310 lb 10.1 oz), SpO2 96 %.  PHYSICAL EXAMINATION:  Physical Exam  GENERAL:  71 y.o.-year-old obese patient lying in the bed , Currently intubated, sedated. EYES: Pupils equal, round, reactive to light.. No scleral icterus. HEENT: Head atraumatic, normocephalic. Orally intubated. NECK:  Supple, no jugular venous distention. No thyroid enlargement, no tenderness.  LUNGS: Diminished breath sounds bilaterally. CARDIOVASCULAR: S1, S2 normal. No murmurs, rubs, or gallops.  ABDOMEN: Soft, nontender, nondistended. Bowel sounds present. No organomegaly or mass.  EXTREMITIES: No pedal edema, cyanosis, or  clubbing.  NEUROLOGIC:Unable to do neurological exam because of her intubation, sedation. PSYCHIATRIC: The patient is alert and oriented x 3.  SKIN: No obvious rash, lesion, or ulcer.    LABORATORY PANEL:   CBC  Recent Labs Lab 04/26/17 1058  WBC 11.6*  HGB 6.8*  HCT 22.8*  PLT 221   ------------------------------------------------------------------------------------------------------------------  Chemistries   Recent Labs Lab 04/26/17 1058  NA 146*  K 3.8  CL 98*  CO2 37*  GLUCOSE 182*  BUN 63*  CREATININE 2.08*  CALCIUM 7.8*  MG 2.1   ------------------------------------------------------------------------------------------------------------------  Cardiac Enzymes No results for input(s): TROPONINI in the last 168 hours. ------------------------------------------------------------------------------------------------------------------  RADIOLOGY:  US Renal  Result Date: 04/25/2017 CLINICAL DATA:  70 y/o  F; acute renal failure. EXAM: RENAL / URINARY TRACT ULTRASOUND COMPLETE COMPARISON:  None. FINDINGS: Right Kidney: Length: 12.8 cm. Echogenicity within normal limits. No mass or hydronephrosis visualized. Left Kidney: Length: 11.0 cm. Echogenicity within normal limits. No mass or hydronephrosis visualized. Bladder: Decompressed by Foley catheter. Incidentally noted sludge in the gallbladder. IMPRESSION: Normal renal echogenicity. No hydronephrosis or renal mass identified. Mild left kidney atrophy. Gallbladder sludge. Electronically Signed   By: Kristine Garbe M.D.   On: 04/25/2017 16:10   Dg Chest Port 1 View  Result Date: 04/25/2017 CLINICAL DATA:  Pneumonia. EXAM: PORTABLE CHEST 1 VIEW COMPARISON:  Radiograph of April 23, 2017. FINDINGS: Stable cardiomegaly. Stable position of endotracheal tube. Nasogastric tube is seen entering stomach. Right internal jugular catheter is unchanged. No pneumothorax is noted. Increased bilateral perihilar and basilar  opacities are noted concerning for worsening edema or inflammation. Bony thorax is unremarkable. IMPRESSION: Stable support apparatus. Increased bilateral lung opacities concerning for worsening edema or inflammation. Electronically  Signed   By: Marijo Conception, M.D.   On: 04/25/2017 07:54    EKG:   Orders placed or performed during the hospital encounter of 04/19/17  . ED EKG  . ED EKG  . EKG 12-Lead  . EKG 12-Lead  . EKG    ASSESSMENT AND PLAN:   71 year old female, morbidly obese with past medical history significant for chronic diastolic heart failure, hypertension, diabetes and CK D stage III presents to hospital secondary to worsening shortness of breath.  * Acute on chronic hypoxic respiratory failure'And 80 acute on chronic diastolic heart failure, pneumonia Worsening respiratory failure last night, currently intubated, sedated; CT chest this morning showed a left lower lobe pneumonia, patchy opacity in the right upper lobe and right middle lobe., Patient had fever today. Repeat blood cultures, urine cultures, start IV antibiotics with vancomycin, Zosyn. Wean as tolerated as per CCU MD direction.  . Nephrology is consulted for hypernatremia, hypokalemia and hyppernatremia, hypokalemia improving at this time.,: -  -#2 acute on chronic chronic kidney disease stage III with baseline GFR 35: Not a candidate for hemodialysis at this time.  nephrology is following. Monitor urine output, Stopped  IV diuretics   Acute renal failure likely due to hypotension: Renal ultrasound is negative for any hydronephrosis or obstruction.   * * Insulin-dependent diabetes mellitus. Continue insulin drip.  * CKD stage III is stable.   * Acute anemia  hbof 6 yesterday. Patient received 1 unit of packed RBC transfusion, hemoglobin improved to 1.3 but then  dropped again to 6.8. Consult gastroenterology, hold aspirin, Lovenox, check stool for guaiac, continue PPIs, further plans based upon blood  work and no indication for transfusion at gain. D/w pt's son,RN.   All the records are reviewed and case discussed with Care Management/Social Workerr. Management plans discussed with the patient, family and they are in agreement.  CODE STATUS: Full Code  TOTAL TIME TAKING CARE OF THIS PATIENT: 36 minutes.   POSSIBLE D/C IN 2-3 DAYS, DEPENDING ON CLINICAL CONDITION.   Epifanio Lesches M.D on 04/26/2017 at 1:53 PM  Between 7am to 6pm - Pager - [phone]  After 6pm go to [email or web address] - password [email or web address]  `[facility]  Office  [phone]  CC: Primary care physician; Hermelinda Dellen, DO

## 2017-04-26 NOTE — Progress Notes (Signed)
MEDICATION RELATED CONSULT NOTE - INITIAL   Pharmacy Consult for electrolyte management   Pharmacy consulted for electrolyte management for 71 yo female admitted with CHF exacerbation.   Plan:  Electrolytes WNL, no supplementation at this time. Recheck with AM labs  Allergies  Allergen Reactions  . Ace Inhibitors Cough    Patient Measurements: Height: 5\' 5"  (165.1 cm) Weight: (!) 310 lb 10.1 oz (140.9 kg) IBW/kg (Calculated) : 57   Vital Signs: Temp: 99 F (37.2 C) (10/14 1200) Temp Source: Oral (10/14 1200) BP: 136/44 (10/14 1400) Pulse Rate: 73 (10/14 1400) Intake/Output from previous day: 10/13 0701 - 10/14 0700 In: 1942.2 [I.V.:397.2; Blood:305; NG/GT:1240] Out: 1075 [VVYXA:1587] Intake/Output from this shift: Total I/O In: 362.8 [I.V.:122.8; NG/GT:240] Out: 150 [Urine:150]  Labs:  Recent Labs  04/24/17 0608 04/25/17 0515 04/25/17 0849 04/25/17 1455 04/26/17 0542 04/26/17 1058  WBC  --  9.1  --   --   --  11.6*  HGB  --  6.0*  --  7.3*  --  6.8*  HCT  --  19.8*  --  24.3*  --  22.8*  PLT  --  206  --   --   --  221  CREATININE 1.65*  --  2.23*  --  2.16* 2.08*  MG 2.0  --   --   --   --  2.1  PHOS 1.6*  --  3.8  --   --  3.0   Estimated Creatinine Clearance: 35.5 mL/min (A) (by C-G formula based on SCr of 2.08 mg/dL (H)).   Pharmacy will continue to monitor and adjust per consult.   Lajuana Patchell C 04/26/2017,4:01 PM

## 2017-04-26 NOTE — Progress Notes (Signed)
[facility]  ROUNDING NOTE   Subjective:  Patient seen at bedside in the critical care unit. Remains critically ill. Still on the ventilator. Urine output was 1 L over the preceding 24 hours. Creatinine slightly better at 2.1.   Objective:  Vital signs in last 24 hours:  Temp:  [98.9 F (37.2 C)-101.1 F (38.4 C)] 101.1 F (38.4 C) (10/14 0800) Pulse Rate:  [68-84] 77 (10/14 1000) Resp:  [5-17] 15 (10/14 1000) BP: (85-168)/(36-70) 154/49 (10/14 1000) SpO2:  [91 %-100 %] 96 % (10/14 1000) FiO2 (%):  [40 %] 40 % (10/14 1000) Weight:  [140.9 kg (310 lb 10.1 oz)] 140.9 kg (310 lb 10.1 oz) (10/14 0431)  Weight change:  Filed Weights   04/23/17 0500 04/24/17 0329 04/26/17 0431  Weight: (!) 140.2 kg (309 lb 1.4 oz) (!) 137.3 kg (302 lb 11.1 oz) (!) 140.9 kg (310 lb 10.1 oz)    Intake/Output: I/O last 3 completed shifts: In: 2561.5 [I.V.:576.5; Blood:305; NG/GT:1680] Out: 0277 [Urine:1450]   Intake/Output this shift:  Total I/O In: 306.7 [I.V.:66.7; NG/GT:240] Out: 150 [Urine:150]  Physical Exam: General: critically ill appearing  Head: Normocephalic, atraumatic. ETT in place  Eyes: Anicteric  Neck: Supple, trachea midline  Lungs:  Scattered rhonchi, vent assisted  Heart: S1S2 no rubs  Abdomen:  Soft, nontender, bowel sounds present  Extremities: 1+ peripheral edema.  Neurologic: sedated  Skin: No rash       Basic Metabolic Panel:  Recent Labs Lab 04/19/17 2014  04/21/17 0519 04/22/17 0539 04/23/17 1211 04/24/17 0608 04/25/17 0849 04/26/17 0542  NA 148*  < > 148* 148* 149* 149* 144 144  K 3.3*  < > 3.7 3.6 4.5 3.1* 4.1 3.4*  CL 101  < > 101 98* 99* 99* 97* 98*  CO2 35*  < > 38* 41* 36* 38* 36* 36*  GLUCOSE 158*  < > 126* 109* 134* 74 226* 180*  BUN 23*  < > 26* 29* 29* 30* 44* 59*  CREATININE 1.74*  < > 1.84* 1.77* 1.68* 1.65* 2.23* 2.16*  CALCIUM 8.5*  < > 8.4* 8.3* 8.5* 8.3* 7.7* 7.6*  MG 1.9  --  1.9 1.7 2.2 2.0  --   --   PHOS 3.9   --   --   --   --  1.6* 3.8  --   < > = values in this interval not displayed.  Liver Function Tests: No results for input(s): AST, ALT, ALKPHOS, BILITOT, PROT, ALBUMIN in the last 168 hours. No results for input(s): LIPASE, AMYLASE in the last 168 hours. No results for input(s): AMMONIA in the last 168 hours.  CBC:  Recent Labs Lab 04/19/17 2014 04/20/17 0441 04/21/17 0519 04/25/17 0515 04/25/17 1455  WBC 10.5 10.7 6.0 9.1  --   NEUTROABS  --   --  4.7 6.9*  --   HGB 7.8* 7.2* 7.6* 6.0* 7.3*  HCT 25.6* 23.1* 25.0* 19.8* 24.3*  MCV 70.8* 69.9* 71.6* 72.0*  --   PLT 248 235 213 206  --     Cardiac Enzymes: No results for input(s): CKTOTAL, CKMB, CKMBINDEX, TROPONINI in the last 168 hours.  BNP: Invalid input(s): POCBNP  CBG:  Recent Labs Lab 04/26/17 0543 04/26/17 0629 04/26/17 0759 04/26/17 0900 04/26/17 1002  GLUCAP 166* 158* 160* 171* 167*    Microbiology: Results for orders placed or performed during the hospital encounter of 04/19/17  MRSA PCR Screening     Status: Abnormal   Collection Time: 04/19/17  10:07 AM  Result Value Ref Range Status   MRSA by PCR POSITIVE (A) NEGATIVE Final    Comment:        The GeneXpert MRSA Assay (FDA approved for NASAL specimens only), is one component of a comprehensive MRSA colonization surveillance program. It is not intended to diagnose MRSA infection nor to guide or monitor treatment for MRSA infections. RESULT CALLED TO, READ BACK BY AND VERIFIED WITH: SANDRA BOREA ON 04/19/17 AT 1228 [facility]     Coagulation Studies: No results for input(s): LABPROT, INR in the last 72 hours.  Urinalysis: No results for input(s): COLORURINE, LABSPEC, PHURINE, GLUCOSEU, HGBUR, BILIRUBINUR, KETONESUR, PROTEINUR, UROBILINOGEN, NITRITE, LEUKOCYTESUR in the last 72 hours.  Invalid input(s): APPERANCEUR    Imaging: Ct Chest Wo Contrast  Result Date: 04/24/2017 CLINICAL DATA:  Respiratory failure EXAM: CT CHEST WITHOUT CONTRAST  TECHNIQUE: Multidetector CT imaging of the chest was performed following the standard protocol without IV contrast. COMPARISON:  Chest radiographs dated 04/23/2017 FINDINGS: Cardiovascular: The heart is normal in size. No pericardial effusion. No evidence of thoracic aortic aneurysm. Atherosclerotic calcifications aortic arch. Three vessel coronary atherosclerosis. Right chest port terminating at the cavoatrial junction. Mediastinum/Nodes: No suspicious mediastinal lymphadenopathy. Visualized thyroid is unremarkable. Lungs/Pleura: Endotracheal tube terminates 6 cm above the carina. Patchy left lower lobe opacity (series 3/image 97), suspicious for pneumonia. Additional mild patchy opacity in the posterior right upper lobe and right middle lobe, atelectasis versus pneumonia. Mosaic attenuation, suggesting air trapping or small airways disease. Moderate bilateral pleural effusions.  No pneumothorax. Upper Abdomen: Visualized upper abdomen is grossly unremarkable, noting an enteric tube coursing into the mid stomach. Musculoskeletal: Mild degenerative changes of the visualized thoracolumbar spine. IMPRESSION: Patchy left lower lobe opacity, suspicious for pneumonia. Additional mild patchy opacity in the posterior right upper lobe and right middle lobe, atelectasis versus pneumonia. Moderate bilateral pleural effusions. Endotracheal tube terminates 6 cm above the carina. Aortic Atherosclerosis (ICD10-I70.0). Electronically Signed   By: Julian Hy M.D.   On: 04/24/2017 12:19   US Renal  Result Date: 04/25/2017 CLINICAL DATA:  71 y/o  F; acute renal failure. EXAM: RENAL / URINARY TRACT ULTRASOUND COMPLETE COMPARISON:  None. FINDINGS: Right Kidney: Length: 12.8 cm. Echogenicity within normal limits. No mass or hydronephrosis visualized. Left Kidney: Length: 11.0 cm. Echogenicity within normal limits. No mass or hydronephrosis visualized. Bladder: Decompressed by Foley catheter. Incidentally noted sludge in the  gallbladder. IMPRESSION: Normal renal echogenicity. No hydronephrosis or renal mass identified. Mild left kidney atrophy. Gallbladder sludge. Electronically Signed   By: Kristine Garbe M.D.   On: 04/25/2017 16:10   Dg Chest Port 1 View  Result Date: 04/25/2017 CLINICAL DATA:  Pneumonia. EXAM: PORTABLE CHEST 1 VIEW COMPARISON:  Radiograph of April 23, 2017. FINDINGS: Stable cardiomegaly. Stable position of endotracheal tube. Nasogastric tube is seen entering stomach. Right internal jugular catheter is unchanged. No pneumothorax is noted. Increased bilateral perihilar and basilar opacities are noted concerning for worsening edema or inflammation. Bony thorax is unremarkable. IMPRESSION: Stable support apparatus. Increased bilateral lung opacities concerning for worsening edema or inflammation. Electronically Signed   By: Marijo Conception, M.D.   On: 04/25/2017 07:54     Medications:   . sodium chloride    . sodium chloride Stopped (04/26/17 0334)  . dextrose 5 % and 0.45% NaCl 10 mL/hr at 04/26/17 1000  . fentaNYL infusion INTRAVENOUS 50 mcg/hr (04/26/17 1000)  . insulin (NOVOLIN-R) infusion    . norepinephrine (LEVOPHED) Adult infusion Stopped (04/25/17 2204)   .  aspirin EC  81 mg Oral QHS  . chlorhexidine gluconate (MEDLINE KIT)  15 mL Mouth Rinse BID  . cloNIDine  0.1 mg Transdermal Weekly  . docusate  100 mg Oral BID  . enoxaparin (LOVENOX) injection  40 mg Subcutaneous Q12H  . feeding supplement (PRO-STAT SUGAR FREE 64)  60 mL Per Tube BID  . feeding supplement (VITAL HIGH PROTEIN)  1,000 mL Per Tube Q24H  . gabapentin  300 mg Oral TID  . ipratropium-albuterol  3 mL Nebulization Q6H  . mouth rinse  15 mL Mouth Rinse 10 times per day  . montelukast  10 mg Oral Daily  . pravastatin  40 mg Oral q1800  . sennosides  5 mL Oral BID  . sodium chloride flush  10-40 mL Intracatheter Q12H  . sodium chloride flush  3 mL Intravenous Q12H  . sucralfate  1 g Per Tube TID    acetaminophen **OR** acetaminophen, dextrose, fentaNYL, hydrALAZINE, ipratropium-albuterol, midazolam, midazolam, ondansetron (ZOFRAN) IV, polyethylene glycol, sodium chloride flush  Assessment/ Plan:  71 y.o. female with a PMHx of anemia of chronic kidney disease, congestive heart failure, chronic kidney disease stage III baseline EGFR 35, diabetes mellitus type 2, peripheral edema, GERD, hypertension, hyperlipidemia, peripheral neuropathy, obesity, history of CVA, obstructive sleep apnea,who was admitted to [facility] on 04/19/2017 for evaluation of respiratory distress.  1.  Acute renal failure/Chronic kidney disease stage III baseline EGFR 35.  2.  Acute respiratory failure. 3.  Acute on chronic diastolic heart failure. 4.  Hypernatremia, resolved. 5.  Hypokalemia, K currently 3.4.   Plan:  Renal ultrasound reviewed. Negative for hydronephrosis.  Patient currently off of Lasix which we agree with. No urgent indication for dialysis at the moment as patient does have adequate urine output. For now continue supportive care. Patient to remain on the ventilator at this point in time. We will continue to monitor her progress along with you.   LOS: 7 Keller Bounds 10/14/201810:18 AM

## 2017-04-27 DIAGNOSIS — N189 Chronic kidney disease, unspecified: Secondary | ICD-10-CM

## 2017-04-27 DIAGNOSIS — I509 Heart failure, unspecified: Secondary | ICD-10-CM

## 2017-04-27 DIAGNOSIS — D631 Anemia in chronic kidney disease: Secondary | ICD-10-CM

## 2017-04-27 DIAGNOSIS — J9602 Acute respiratory failure with hypercapnia: Secondary | ICD-10-CM

## 2017-04-27 DIAGNOSIS — N179 Acute kidney failure, unspecified: Secondary | ICD-10-CM

## 2017-04-27 DIAGNOSIS — D62 Acute posthemorrhagic anemia: Secondary | ICD-10-CM

## 2017-04-27 LAB — URINALYSIS, ROUTINE W REFLEX MICROSCOPIC
BACTERIA UA: NONE SEEN
BILIRUBIN URINE: NEGATIVE
Glucose, UA: NEGATIVE mg/dL
Ketones, ur: NEGATIVE mg/dL
NITRITE: NEGATIVE
PH: 7 (ref 5.0–8.0)
Protein, ur: 100 mg/dL — AB
SPECIFIC GRAVITY, URINE: 1.017 (ref 1.005–1.030)

## 2017-04-27 LAB — GLUCOSE, CAPILLARY
GLUCOSE-CAPILLARY: 121 mg/dL — AB (ref 65–99)
GLUCOSE-CAPILLARY: 150 mg/dL — AB (ref 65–99)
GLUCOSE-CAPILLARY: 151 mg/dL — AB (ref 65–99)
GLUCOSE-CAPILLARY: 171 mg/dL — AB (ref 65–99)
GLUCOSE-CAPILLARY: 179 mg/dL — AB (ref 65–99)
GLUCOSE-CAPILLARY: 180 mg/dL — AB (ref 65–99)
GLUCOSE-CAPILLARY: 180 mg/dL — AB (ref 65–99)
GLUCOSE-CAPILLARY: 199 mg/dL — AB (ref 65–99)
Glucose-Capillary: 147 mg/dL — ABNORMAL HIGH (ref 65–99)
Glucose-Capillary: 145 mg/dL — ABNORMAL HIGH (ref 65–99)
Glucose-Capillary: 152 mg/dL — ABNORMAL HIGH (ref 65–99)
Glucose-Capillary: 132 mg/dL — ABNORMAL HIGH (ref 65–99)
Glucose-Capillary: 137 mg/dL — ABNORMAL HIGH (ref 65–99)
Glucose-Capillary: 138 mg/dL — ABNORMAL HIGH (ref 65–99)
Glucose-Capillary: 159 mg/dL — ABNORMAL HIGH (ref 65–99)
Glucose-Capillary: 173 mg/dL — ABNORMAL HIGH (ref 65–99)
Glucose-Capillary: 198 mg/dL — ABNORMAL HIGH (ref 65–99)
Glucose-Capillary: 203 mg/dL — ABNORMAL HIGH (ref 65–99)

## 2017-04-27 LAB — BASIC METABOLIC PANEL
Anion gap: 9 (ref 5–15)
BUN: 63 mg/dL — AB (ref 6–20)
CHLORIDE: 102 mmol/L (ref 101–111)
CO2: 36 mmol/L — AB (ref 22–32)
Calcium: 7.8 mg/dL — ABNORMAL LOW (ref 8.9–10.3)
Creatinine, Ser: 1.81 mg/dL — ABNORMAL HIGH (ref 0.44–1.00)
GFR calc Af Amer: 31 mL/min — ABNORMAL LOW (ref >60)
GFR calc non Af Amer: 27 mL/min — ABNORMAL LOW (ref >60)
GLUCOSE: 141 mg/dL — AB (ref 65–99)
POTASSIUM: 3.5 mmol/L (ref 3.5–5.1)
Sodium: 147 mmol/L — ABNORMAL HIGH (ref 135–145)

## 2017-04-27 LAB — CBC
HEMATOCRIT: 24 % — AB (ref 35.0–47.0)
HEMOGLOBIN: 7.6 g/dL — AB (ref 12.0–16.0)
MCH: 23.2 pg — AB (ref 26.0–34.0)
MCHC: 31.5 g/dL — ABNORMAL LOW (ref 32.0–36.0)
MCV: 73.8 fL — AB (ref 80.0–100.0)
Platelets: 232 10*3/uL (ref 150–440)
RBC: 3.25 MIL/uL — AB (ref 3.80–5.20)
RDW: 20.5 % — ABNORMAL HIGH (ref 11.5–14.5)
WBC: 13.8 10*3/uL — ABNORMAL HIGH (ref 3.6–11.0)

## 2017-04-27 LAB — PROTEIN / CREATININE RATIO, URINE
Creatinine, Urine: 70 mg/dL
PROTEIN CREATININE RATIO: 0.99 mg/mg{creat} — AB (ref 0.00–0.15)
TOTAL PROTEIN, URINE: 69 mg/dL

## 2017-04-27 LAB — PREPARE RBC (CROSSMATCH)

## 2017-04-27 MED ORDER — HYDRALAZINE HCL 20 MG/ML IJ SOLN
10.0000 mg | INTRAMUSCULAR | Status: DC | PRN
  Administered 2017-04-29 (×2): 20 mg via INTRAVENOUS
  Administered 2017-04-29 – 2017-04-30 (×2): 40 mg via INTRAVENOUS
  Administered 2017-04-30: 20 mg via INTRAVENOUS
  Filled 2017-04-27: qty 1
  Filled 2017-04-27: qty 2
  Filled 2017-04-27: qty 1
  Filled 2017-04-27: qty 2
  Filled 2017-04-27: qty 1

## 2017-04-27 MED ORDER — DOCUSATE SODIUM 50 MG/5ML PO LIQD
100.0000 mg | Freq: Two times a day (BID) | ORAL | Status: DC
  Administered 2017-04-27 – 2017-04-28 (×3): 100 mg
  Filled 2017-04-27 (×3): qty 10

## 2017-04-27 MED ORDER — DEXTROSE 5 % IV SOLN
INTRAVENOUS | Status: DC
  Administered 2017-04-27 – 2017-04-29 (×2): via INTRAVENOUS

## 2017-04-27 MED ORDER — MIDAZOLAM HCL 2 MG/2ML IJ SOLN: 1.0000 mg | INTRAMUSCULAR | Status: DC | PRN

## 2017-04-27 MED ORDER — INSULIN GLARGINE 100 UNIT/ML ~~LOC~~ SOLN
50.0000 [IU] | Freq: Every day | SUBCUTANEOUS | Status: DC
  Administered 2017-04-27 – 2017-04-30 (×4): 50 [IU] via SUBCUTANEOUS
  Filled 2017-04-27 (×5): qty 0.5

## 2017-04-27 MED ORDER — PRO-STAT SUGAR FREE PO LIQD
60.0000 mL | Freq: Two times a day (BID) | ORAL | Status: DC
  Administered 2017-04-27 – 2017-04-28 (×4): 60 mL

## 2017-04-27 MED ORDER — INSULIN GLARGINE 100 UNIT/ML ~~LOC~~ SOLN
50.0000 [IU] | Freq: Every day | SUBCUTANEOUS | Status: DC
  Filled 2017-04-27: qty 0.5

## 2017-04-27 MED ORDER — ACETAMINOPHEN 325 MG PO TABS: 650.0000 mg | ORAL_TABLET | Freq: Four times a day (QID) | ORAL | Status: DC | PRN

## 2017-04-27 MED ORDER — SODIUM CHLORIDE 0.9 % IV SOLN
Freq: Once | INTRAVENOUS | Status: AC
  Administered 2017-04-27: 01:00:00 via INTRAVENOUS

## 2017-04-27 MED ORDER — ACETAMINOPHEN 650 MG RE SUPP: 650.0000 mg | Freq: Four times a day (QID) | RECTAL | Status: DC | PRN

## 2017-04-27 MED ORDER — PANTOPRAZOLE SODIUM 40 MG IV SOLR
40.0000 mg | INTRAVENOUS | Status: DC
  Administered 2017-04-27: 40 mg via INTRAVENOUS
  Filled 2017-04-27: qty 40

## 2017-04-27 MED ORDER — BUDESONIDE 0.25 MG/2ML IN SUSP
0.2500 mg | Freq: Four times a day (QID) | RESPIRATORY_TRACT | Status: DC
  Administered 2017-04-27 – 2017-05-02 (×19): 0.25 mg via RESPIRATORY_TRACT
  Filled 2017-04-27 (×20): qty 2

## 2017-04-27 MED ORDER — FENTANYL CITRATE (PF) 100 MCG/2ML IJ SOLN
50.0000 ug | INTRAMUSCULAR | Status: DC | PRN
  Administered 2017-04-27 – 2017-04-28 (×4): 50 ug via INTRAVENOUS
  Filled 2017-04-27 (×4): qty 2

## 2017-04-27 MED ORDER — SENNOSIDES 8.8 MG/5ML PO SYRP
5.0000 mL | ORAL_SOLUTION | Freq: Two times a day (BID) | ORAL | Status: DC
  Administered 2017-04-27 – 2017-05-05 (×8): 5 mL
  Filled 2017-04-27 (×17): qty 5

## 2017-04-27 MED ORDER — ADULT MULTIVITAMIN LIQUID CH
15.0000 mL | Freq: Every day | ORAL | Status: DC
  Administered 2017-04-27 – 2017-04-28 (×2): 15 mL via ORAL
  Filled 2017-04-27 (×4): qty 15

## 2017-04-27 MED ORDER — INSULIN ASPART 100 UNIT/ML ~~LOC~~ SOLN
0.0000 [IU] | SUBCUTANEOUS | Status: DC
  Administered 2017-04-27 – 2017-04-28 (×8): 4 [IU] via SUBCUTANEOUS
  Administered 2017-04-29: 11 [IU] via SUBCUTANEOUS
  Administered 2017-04-29: 3 [IU] via SUBCUTANEOUS
  Administered 2017-04-29: 7 [IU] via SUBCUTANEOUS
  Administered 2017-04-29: 3 [IU] via SUBCUTANEOUS
  Administered 2017-04-29: 7 [IU] via SUBCUTANEOUS
  Administered 2017-04-30 (×4): 4 [IU] via SUBCUTANEOUS
  Administered 2017-04-30: 3 [IU] via SUBCUTANEOUS
  Administered 2017-04-30: 4 [IU] via SUBCUTANEOUS
  Filled 2017-04-27 (×20): qty 1

## 2017-04-27 MED ORDER — INSULIN ASPART 100 UNIT/ML ~~LOC~~ SOLN
4.0000 [IU] | SUBCUTANEOUS | Status: DC
  Administered 2017-04-27 – 2017-04-29 (×10): 4 [IU] via SUBCUTANEOUS
  Filled 2017-04-27 (×9): qty 1

## 2017-04-27 MED ORDER — POLYETHYLENE GLYCOL 3350 17 G PO PACK: 17.0000 g | PACK | Freq: Every day | ORAL | Status: DC | PRN

## 2017-04-27 MED ORDER — ENOXAPARIN SODIUM 40 MG/0.4ML ~~LOC~~ SOLN: 40.0000 mg | SUBCUTANEOUS | Status: DC

## 2017-04-27 NOTE — Consult Note (Signed)
Veronica Lame, MD [facility]  [address] [place], [place] [address] Phone: [phone] Fax : [phone]  Consultation  Referring Provider:     Dr. Vianne Anderson Primary Care Physician:  Veronica Dellen, DO Primary Gastroenterologist:  Althia Forts         Reason for Consultation:     Anemia  Date of Admission:  04/19/2017 Date of Consultation:  04/27/2017         HPI:   Veronica Anderson is a 71 y.o. female who was admitted with heart failure and is intubated. The patient was found to have anemia. The patient's anemia has been long-standing and her son states that she was anemic back when he was 89 years ago. The patient has been chronically on iron. There is no report of any workup for the anemia in the past. The patient is unable to give any history she is intubated but the son thinks she may have had a colonoscopy years ago but does not know when or by whom. The patient denies any abdominal pain and is able to shake her head yes and no to certain questions. The patient had a drop in her hemoglobin to 6.8 and her aspirin and Lovenox were discontinued. Her stool was also sent off for Hemoccult.  Past Medical History:  Diagnosis Date  . Allergy   . Anemia   . Asthma   . Cataract    bilateral  . CHF (NYHA class II, ACC/AHA stage C) ([place])   . Chronic kidney disease    stage IV (severe), Dr. Aleene Davidson  . Constipation   . Diabetes mellitus without complication ([place])   . Edema    feet/ankles  . GERD (gastroesophageal reflux disease)   . Hyperlipidemia   . Hypertension   . Left ankle pain   . Lumbago   . Myocardial infarction ([place])   . Neuropathy   . Neuropathy   . OA (osteoarthritis)   . Obesity   . Obstructive sleep apnea syndrome    CPAP  . Paresthesia    Foot  . Protein calorie malnutrition ([place])   . Requires supplemental oxygen   . Stroke ([place])   . Vitamin D deficiency     Past Surgical History:  Procedure Laterality Date  . ANKLE SURGERY Left   . CATARACT EXTRACTION  W/PHACO Right 12/20/2015   Procedure: CATARACT EXTRACTION PHACO AND INTRAOCULAR LENS PLACEMENT (IOC);  Surgeon: Eulogio Bear, MD;  Location: [facility] ORS;  Service: Ophthalmology;  Laterality: Right;  [place] 2.50 AP% 21.8 CDE 37.04 Fluid pack lot # [id number]  . CATARACT EXTRACTION W/PHACO Left 02/14/2016   Procedure: CATARACT EXTRACTION PHACO AND INTRAOCULAR LENS PLACEMENT (IOC);  Surgeon: Eulogio Bear, MD;  Location: [facility] ORS;  Service: Ophthalmology;  Laterality: Left;  [place] 3.12 AP% 42.1 CDE 47.12 Fluid Pack lot # [id number]  . COLONOSCOPY    . FRACTURE SURGERY Left    ankle    Prior to Admission medications   Medication Sig Start Date End Date Taking? Authorizing Provider  amLODipine (NORVASC) 10 MG tablet TAKE 1 TABLET (10 MG TOTAL) BY MOUTH DAILY. 02/13/17  Yes Steele Sizer, MD  aspirin EC 81 MG tablet Take 81 mg by mouth at bedtime.   Yes [provider]  cloNIDine (CATAPRES - DOSED IN MG/24 HR) 0.3 mg/24hr patch PLACE 1 PATCH (0.3 MG TOTAL) ONTO THE SKIN ONCE A WEEK. APPLY PATCH ON SUNDAY. 03/31/17  Yes Sowles, Drue Stager, MD  desonide (DESOWEN) 0.05 % ointment Apply 1 application  topically 2 (two) times daily. 08/22/16  Yes Sowles, Drue Stager, MD  furosemide (LASIX) 20 MG tablet Take 1 tablet (20 mg total) by mouth 2 (two) times daily. 08/21/16  Yes Sowles, Drue Stager, MD  gabapentin (NEURONTIN) 300 MG capsule TAKE 1 CAPSULE (300 MG TOTAL) BY MOUTH 3 (THREE) TIMES DAILY. 11/26/16  Yes Sowles, Drue Stager, MD  hydrALAZINE (APRESOLINE) 25 MG tablet Take 1 tablet (25 mg total) by mouth 3 (three) times daily. 04/17/17  Yes Sowles, Drue Stager, MD  Insulin Glargine (LANTUS SOLOSTAR) 100 UNIT/ML Solostar Pen INJECT 50 UNITS SUBCUTANEOUSLY DAILY 11/12/16  Yes Sowles, Drue Stager, MD  insulin lispro (HUMALOG KWIKPEN) 100 UNIT/ML KiwkPen INJECT 5 TO 10 UNITS SUBCUTANEOUSLY BEFORE MEALS Patient taking differently: Inject 5 Units into the skin 3 (three) times daily.  08/21/16  Yes Sowles, Drue Stager, MD  loratadine  (CLARITIN) 10 MG tablet TAKE 1 TABLET BY MOUTH EVERY DAY FOR ALLERGIES 11/11/15  Yes Sowles, Drue Stager, MD  metoprolol (LOPRESSOR) 100 MG tablet Take 1 tablet (100 mg total) by mouth 2 (two) times daily. 08/21/16  Yes Sowles, Drue Stager, MD  montelukast (SINGULAIR) 10 MG tablet Take 1 tablet (10 mg total) by mouth daily. 08/21/16  Yes Sowles, Drue Stager, MD  pravastatin (PRAVACHOL) 40 MG tablet TAKE 1 TABLET BY MOUTH ONCE A DAY FOR CHOLESTEROL 08/21/16  Yes Sowles, Drue Stager, MD  ranitidine (ZANTAC) 150 MG tablet TAKE 1 TABLET (150 MG TOTAL) BY MOUTH 2 (TWO) TIMES DAILY. TRYING TO WEAN OFF OMEPRAZOLE 02/13/17  Yes Sowles, Drue Stager, MD  Vitamin D, Ergocalciferol, (DRISDOL) 50000 units CAPS capsule TAKE ONE CAPSULE BY MOUTH ONCE A WEEK 03/01/17  Yes Ancil Boozer, Drue Stager, MD  ACCU-CHEK AVIVA PLUS test strip CHECK BLOOD SUGAR TWICE DAILY 10/03/16   Ancil Boozer, Drue Stager, MD  NOVOFINE 32G X 6 MM MISC USE 4 TIMES A DAY AS DIRECTED 02/19/17   Steele Sizer, MD  nystatin (NYSTATIN) powder Apply 1 g topically 3 (three) times daily. 12/24/16   Hubbard Hartshorn, FNP    Family History  Problem Relation Age of Onset  . Diabetes type II Unknown      Social History  Substance Use Topics  . Smoking status: Former Smoker    Types: Cigarettes    Quit date: 07/14/1994  . Smokeless tobacco: Never Used  . Alcohol use No    Allergies as of 04/19/2017 - Review Complete 04/19/2017  Allergen Reaction Noted  . Ace inhibitors Cough 02/07/2015    Review of Systems:    All systems reviewed and negative except where noted in HPI.   Physical Exam:  Vital signs in last 24 hours: Temp:  [97 F (36.1 C)-100.6 F (38.1 C)] 99.5 F (37.5 C) (10/15 0820) Pulse Rate:  [62-77] 66 (10/15 1000) Resp:  [12-17] 15 (10/15 1000) BP: (98-155)/(34-119) 98/59 (10/15 1000) SpO2:  [95 %-100 %] 100 % (10/15 1000) FiO2 (%):  [40 %] 40 % (10/15 0830) Last BM Date: 04/19/17 (per pt. report) General:   Pleasant, cooperative in NAD Head:  Normocephalic and  atraumatic. Eyes:   No icterus.   Conjunctiva pink. PERRLA. Ears:  Normal auditory acuity. Neck:  Supple; no masses or thyroidomegaly Lungs:Intubated and on a ventilator. Lungs clear to auscultation bilaterally.   No wheezes, crackles, or rhonchi.  Heart:  Regular rate and rhythm;  Without murmur, clicks, rubs or gallops Abdomen:  Soft, nondistended, nontender. Normal bowel sounds. No appreciable masses or hepatomegaly.  No rebound or guarding.  Rectal:  Not performed. Msk:  Symmetrical without gross deformities.    Extremities:  Without edema, cyanosis or clubbing. Neurologic:  Unable to examine. Skin:  Intact without significant lesions or rashes. Cervical Nodes:  No significant cervical adenopathy. Psych:  Alert and cooperative. Normal affect.  LAB RESULTS:  Recent Labs  04/26/17 1058 04/26/17 2244 04/27/17 0538  WBC 11.6* 11.9* 13.8*  HGB 6.8* 6.6* 7.6*  HCT 22.8* 22.4* 24.0*  PLT 221 221 232   BMET  Recent Labs  04/26/17 0542 04/26/17 1058 04/27/17 0538  NA 144 146* 147*  K 3.4* 3.8 3.5  CL 98* 98* 102  CO2 36* 37* 36*  GLUCOSE 180* 182* 141*  BUN 59* 63* 63*  CREATININE 2.16* 2.08* 1.81*  CALCIUM 7.6* 7.8* 7.8*   LFT No results for input(s): PROT, ALBUMIN, AST, ALT, ALKPHOS, BILITOT, BILIDIR, IBILI in the last 72 hours. PT/INR No results for input(s): LABPROT, INR in the last 72 hours.  STUDIES: Dg Abd 1 View  Result Date: 04/26/2017 CLINICAL DATA:  Constipation.  OG tube placement. EXAM: ABDOMEN - 1 VIEW COMPARISON:  04/23/2017 FINDINGS: Enteric tube tip is in the left upper quadrant consistent with location in the body of the stomach. No small or large bowel distention is appreciated. Scattered stool throughout the colon. Calcifications in the pelvis likely represent uterine fibroids. Vascular calcifications. IMPRESSION: Enteric tube tip is in the left upper quadrant consistent with location in the body of stomach. Nonobstructive bowel gas pattern.  Electronically Signed   By: Lucienne Capers M.D.   On: 04/26/2017 23:21   US Renal  Result Date: 04/25/2017 CLINICAL DATA:  71 y/o  F; acute renal failure. EXAM: RENAL / URINARY TRACT ULTRASOUND COMPLETE COMPARISON:  None. FINDINGS: Right Kidney: Length: 12.8 cm. Echogenicity within normal limits. No mass or hydronephrosis visualized. Left Kidney: Length: 11.0 cm. Echogenicity within normal limits. No mass or hydronephrosis visualized. Bladder: Decompressed by Foley catheter. Incidentally noted sludge in the gallbladder. IMPRESSION: Normal renal echogenicity. No hydronephrosis or renal mass identified. Mild left kidney atrophy. Gallbladder sludge. Electronically Signed   By: Kristine Garbe M.D.   On: 04/25/2017 16:10   Dg Chest Port 1 View  Result Date: 04/26/2017 CLINICAL DATA:  Possible aspiration. EXAM: PORTABLE CHEST 1 VIEW COMPARISON:  04/25/2017 FINDINGS: Endotracheal tube with tip measuring 6.7 cm above the carina. Enteric tube tip is off the field of view but below the left hemidiaphragm. Right central venous catheter with tip over the low SVC region. The patient is rotated, limiting the examination. Shallow inspiration. Cardiac enlargement without vascular congestion. Bilateral perihilar and basilar airspace disease may represent edema or infiltration. Can't exclude aspiration in the appropriate clinical setting. The opacities are improving since previous study. No blunting of costophrenic angles. No pneumothorax. IMPRESSION: Appliances appear in satisfactory location. Shallow inspiration. Infiltration or atelectasis in the perihilar and basilar regions bilaterally, improved since previous study. Cardiac enlargement. Electronically Signed   By: Lucienne Capers M.D.   On: 04/26/2017 23:20      Impression / Plan:   Earma Nicolaou is a 71 y.o. y/o female with With chronic anemia who had a drop in her hemoglobin. The patient's hemoglobin is now 7.6. The patient is in no condition to  undergo any GI workup at this time. The patient is intubated with congestive heart failure. She also has chronic renal failure. Think further to add from a GI point of view.  Thank you for involving me in the care of this patient.      LOS: 8 days   Veronica Lame, MD  04/27/2017, 10:47  AM   Note: This dictation was prepared with Dragon dictation along with smaller phrase technology. Any transcriptional errors that result from this process are unintentional.

## 2017-04-27 NOTE — Progress Notes (Signed)
[facility]  ROUNDING NOTE   Subjective:   Son at bedside.   UOP 1185   Tmax 101.1  hgb 7.6  Status post 1 unit PRBC transfusion yesterday.   Off norepinephrine gtt.   Objective:  Vital signs in last 24 hours:  Temp:  [97 F (36.1 C)-100.6 F (38.1 C)] 99.5 F (37.5 C) (10/15 0820) Pulse Rate:  [62-78] 63 (10/15 0800) Resp:  [12-17] 15 (10/15 0800) BP: (116-155)/(34-119) 135/48 (10/15 0800) SpO2:  [95 %-100 %] 100 % (10/15 0800) FiO2 (%):  [40 %] 40 % (10/15 0400)  Weight change:  Filed Weights   04/23/17 0500 04/24/17 0329 04/26/17 0431  Weight: (!) 140.2 kg (309 lb 1.4 oz) (!) 137.3 kg (302 lb 11.1 oz) (!) 140.9 kg (310 lb 10.1 oz)    Intake/Output: I/O last 3 completed shifts: In: 2525.4 [I.V.:535.4; Blood:400; NG/GT:1440; IV Piggyback:150] Out: 0960 [Urine:1660]   Intake/Output this shift:  Total I/O In: 112 [I.V.:12; IV Piggyback:100] Out: -   Physical Exam: General: critically ill appearing  Head: +ETT +OGT to suction  Eyes: Anicteric  Neck: Right IJ central line  Lungs:  Mechanical ventilation PRVC FiO2 40%  Heart: regular  Abdomen:  Soft, nontender   Extremities: trace peripheral and dependent edema.  Neurologic: Intubated, and sedated. Following commands off sedation  Skin: No lesions       Basic Metabolic Panel:  Recent Labs Lab 04/21/17 0519 04/22/17 0539 04/23/17 1211 04/24/17 0608 04/25/17 0849 04/26/17 0542 04/26/17 1058 04/27/17 0538  NA 148* 148* 149* 149* 144 144 146* 147*  K 3.7 3.6 4.5 3.1* 4.1 3.4* 3.8 3.5  CL 101 98* 99* 99* 97* 98* 98* 102  CO2 38* 41* 36* 38* 36* 36* 37* 36*  GLUCOSE 126* 109* 134* 74 226* 180* 182* 141*  BUN 26* 29* 29* 30* 44* 59* 63* 63*  CREATININE 1.84* 1.77* 1.68* 1.65* 2.23* 2.16* 2.08* 1.81*  CALCIUM 8.4* 8.3* 8.5* 8.3* 7.7* 7.6* 7.8* 7.8*  MG 1.9 1.7 2.2 2.0  --   --  2.1  --   PHOS  --   --   --  1.6* 3.8  --  3.0  --     Liver Function Tests: No results for input(s): AST,  ALT, ALKPHOS, BILITOT, PROT, ALBUMIN in the last 168 hours. No results for input(s): LIPASE, AMYLASE in the last 168 hours. No results for input(s): AMMONIA in the last 168 hours.  CBC:  Recent Labs Lab 04/21/17 0519 04/25/17 0515 04/25/17 1455 04/26/17 1058 04/26/17 2244 04/27/17 0538  WBC 6.0 9.1  --  11.6* 11.9* 13.8*  NEUTROABS 4.7 6.9*  --  9.1*  --   --   HGB 7.6* 6.0* 7.3* 6.8* 6.6* 7.6*  HCT 25.0* 19.8* 24.3* 22.8* 22.4* 24.0*  MCV 71.6* 72.0*  --  73.3* 73.9* 73.8*  PLT 213 206  --  221 221 232    Cardiac Enzymes: No results for input(s): CKTOTAL, CKMB, CKMBINDEX, TROPONINI in the last 168 hours.  BNP: Invalid input(s): POCBNP  CBG:  Recent Labs Lab 04/27/17 0311 04/27/17 0420 04/27/17 0521 04/27/17 0626 04/27/17 0724  GLUCAP 152* 132* 150* 25* 138*    Microbiology: Results for orders placed or performed during the hospital encounter of 04/19/17  MRSA PCR Screening     Status: Abnormal   Collection Time: 04/19/17 10:07 AM  Result Value Ref Range Status   MRSA by PCR POSITIVE (A) NEGATIVE Final    Comment:  The GeneXpert MRSA Assay (FDA approved for NASAL specimens only), is one component of a comprehensive MRSA colonization surveillance program. It is not intended to diagnose MRSA infection nor to guide or monitor treatment for MRSA infections. RESULT CALLED TO, READ BACK BY AND VERIFIED WITH: SANDRA BOREA ON 04/19/17 AT 1228 [facility]     Coagulation Studies: No results for input(s): LABPROT, INR in the last 72 hours.  Urinalysis: No results for input(s): COLORURINE, LABSPEC, PHURINE, GLUCOSEU, HGBUR, BILIRUBINUR, KETONESUR, PROTEINUR, UROBILINOGEN, NITRITE, LEUKOCYTESUR in the last 72 hours.  Invalid input(s): APPERANCEUR    Imaging: Dg Abd 1 View  Result Date: 04/26/2017 CLINICAL DATA:  Constipation.  OG tube placement. EXAM: ABDOMEN - 1 VIEW COMPARISON:  04/23/2017 FINDINGS: Enteric tube tip is in the left upper quadrant  consistent with location in the body of the stomach. No small or large bowel distention is appreciated. Scattered stool throughout the colon. Calcifications in the pelvis likely represent uterine fibroids. Vascular calcifications. IMPRESSION: Enteric tube tip is in the left upper quadrant consistent with location in the body of stomach. Nonobstructive bowel gas pattern. Electronically Signed   By: Lucienne Capers M.D.   On: 04/26/2017 23:21   US Renal  Result Date: 04/25/2017 CLINICAL DATA:  71 y/o  F; acute renal failure. EXAM: RENAL / URINARY TRACT ULTRASOUND COMPLETE COMPARISON:  None. FINDINGS: Right Kidney: Length: 12.8 cm. Echogenicity within normal limits. No mass or hydronephrosis visualized. Left Kidney: Length: 11.0 cm. Echogenicity within normal limits. No mass or hydronephrosis visualized. Bladder: Decompressed by Foley catheter. Incidentally noted sludge in the gallbladder. IMPRESSION: Normal renal echogenicity. No hydronephrosis or renal mass identified. Mild left kidney atrophy. Gallbladder sludge. Electronically Signed   By: Kristine Garbe M.D.   On: 04/25/2017 16:10   Dg Chest Port 1 View  Result Date: 04/26/2017 CLINICAL DATA:  Possible aspiration. EXAM: PORTABLE CHEST 1 VIEW COMPARISON:  04/25/2017 FINDINGS: Endotracheal tube with tip measuring 6.7 cm above the carina. Enteric tube tip is off the field of view but below the left hemidiaphragm. Right central venous catheter with tip over the low SVC region. The patient is rotated, limiting the examination. Shallow inspiration. Cardiac enlargement without vascular congestion. Bilateral perihilar and basilar airspace disease may represent edema or infiltration. Can't exclude aspiration in the appropriate clinical setting. The opacities are improving since previous study. No blunting of costophrenic angles. No pneumothorax. IMPRESSION: Appliances appear in satisfactory location. Shallow inspiration. Infiltration or atelectasis in  the perihilar and basilar regions bilaterally, improved since previous study. Cardiac enlargement. Electronically Signed   By: Lucienne Capers M.D.   On: 04/26/2017 23:20     Medications:   . sodium chloride Stopped (04/25/17 1130)  . fentaNYL infusion INTRAVENOUS 50 mcg/hr (04/27/17 0600)  . insulin (NOVOLIN-R) infusion 0.9 Units/hr (04/27/17 0830)  . norepinephrine (LEVOPHED) Adult infusion Stopped (04/25/17 2204)  . piperacillin-tazobactam (ZOSYN)  IV 4.5 g (04/27/17 0818)  . vancomycin     . chlorhexidine gluconate (MEDLINE KIT)  15 mL Mouth Rinse BID  . cloNIDine  0.1 mg Transdermal Weekly  . docusate  100 mg Oral BID  . enoxaparin (LOVENOX) injection  40 mg Subcutaneous Q12H  . feeding supplement (VITAL HIGH PROTEIN)  1,000 mL Per Tube Q24H  . gabapentin  300 mg Oral TID  . ipratropium-albuterol  3 mL Nebulization Q6H  . mouth rinse  15 mL Mouth Rinse 10 times per day  . montelukast  10 mg Oral Daily  . pravastatin  40 mg Oral q1800  .  sennosides  5 mL Oral BID  . sodium chloride flush  10-40 mL Intracatheter Q12H  . sodium chloride flush  3 mL Intravenous Q12H  . sucralfate  1 g Per Tube TID   acetaminophen **OR** acetaminophen, fentaNYL, hydrALAZINE, ipratropium-albuterol, midazolam, midazolam, ondansetron (ZOFRAN) IV, polyethylene glycol, sodium chloride flush  Assessment/ Plan:  71 y.o.black female with anemia, congestive heart failure, chronic kidney disease stage III, diabetes mellitus type 2, peripheral edema, GERD, hypertension, hyperlipidemia, peripheral neuropathy, obesity, history of CVA, obstructive sleep apnea  1. Acute renal failure on chronic kidney disease stage III Baseline creatinine of 1.65, GFR of 35 on 04/24/17. No urine studies available.  Chronic kidney disease secondary to diabetes, hypertension.  Acute renal failure secondary to overdiuresis - Currently holding furosemide - Nonoliguric urine output. No indication for dialysis.  - check urine  studies. Renal ultrasound reviewed.   2. Hypernatremia: from free water deficit from overdiuresis. If not extubated today, will order free water.   3. Anemia with renal failure/chronic kidney disease: status post PRBC transfusion on 10/13 and 10/14.    LOS: 8 Lynze Reddy 10/15/20188:53 AM

## 2017-04-27 NOTE — Progress Notes (Signed)
[place] [facility] [place] NAME: Veronica Anderson    MR#:  [id number]  DATE OF BIRTH:  [birth year]  SUBJECTIVE: Afebrile. Off sedation. Following commands   CHIEF COMPLAINT:   Chief Complaint  Patient presents with  . Respiratory Distress   -   REVIEW OF SYSTEMS:  Review of Systems  Unable to perform ROS: Acuity of condition  Constitutional: Positive for malaise/fatigue. Negative for chills and fever.  HENT: Negative for ear discharge, hearing loss and nosebleeds.   Respiratory: Positive for shortness of breath. Negative for cough and wheezing.   Cardiovascular: Negative for chest pain and palpitations.  Gastrointestinal: Negative for abdominal pain, constipation, diarrhea, nausea and vomiting.  Genitourinary: Negative for dysuria.  Musculoskeletal: Negative for myalgias and neck pain.  Neurological: Negative for dizziness, sensory change, speech change, focal weakness, seizures and headaches.  Psychiatric/Behavioral: Negative for depression.  : Alert and awake  DRUG ALLERGIES:   Allergies  Allergen Reactions  . Ace Inhibitors Cough    VITALS:  Blood pressure (!) 144/49, pulse 71, temperature 99.5 F (37.5 C), temperature source Axillary, resp. rate 16, height 5\' 5"  (1.651 m), weight (!) 140.9 kg (310 lb 10.1 oz), SpO2 99 %.  PHYSICAL EXAMINATION:  Physical Exam  GENERAL:  71 y.o.-year-old obese patient lying in the bed , Currently intubated, Saturation is being weaned off.sedationbeing turned off. EYES: Pupils equal, round, reactive to light.. No scleral icterus. HEENT: Head atraumatic, normocephalic. Orally intubated. NECK:  Supple, no jugular venous distention. No thyroid enlargement, no tenderness.  LUNGS: Diminished breath sounds bilaterally. CARDIOVASCULAR: S1, S2 normal. No murmurs, rubs, or gallops.  ABDOMEN: Soft, nontender, nondistended. Bowel sounds present. No organomegaly or mass.  EXTREMITIES: No pedal edema, cyanosis, or  clubbing.  NEUROLOGIC:Unable to do neurological exam because of her intubation,  PSYCHIATRIC: The patient is alert and oriented x 3.  SKIN: No obvious rash, lesion, or ulcer.    LABORATORY PANEL:   CBC  Recent Labs Lab 04/27/17 0538  WBC 13.8*  HGB 7.6*  HCT 24.0*  PLT 232   ------------------------------------------------------------------------------------------------------------------  Chemistries   Recent Labs Lab 04/26/17 1058 04/27/17 0538  NA 146* 147*  K 3.8 3.5  CL 98* 102  CO2 37* 36*  GLUCOSE 182* 141*  BUN 63* 63*  CREATININE 2.08* 1.81*  CALCIUM 7.8* 7.8*  MG 2.1  --    ------------------------------------------------------------------------------------------------------------------  Cardiac Enzymes No results for input(s): TROPONINI in the last 168 hours. ------------------------------------------------------------------------------------------------------------------  RADIOLOGY:  Dg Abd 1 View  Result Date: 04/26/2017 CLINICAL DATA:  Constipation.  OG tube placement. EXAM: ABDOMEN - 1 VIEW COMPARISON:  04/23/2017 FINDINGS: Enteric tube tip is in the left upper quadrant consistent with location in the body of the stomach. No small or large bowel distention is appreciated. Scattered stool throughout the colon. Calcifications in the pelvis likely represent uterine fibroids. Vascular calcifications. IMPRESSION: Enteric tube tip is in the left upper quadrant consistent with location in the body of stomach. Nonobstructive bowel gas pattern. Electronically Signed   By: Lucienne Capers M.D.   On: 04/26/2017 23:21   US Renal  Result Date: 04/25/2017 CLINICAL DATA:  71 y/o  F; acute renal failure. EXAM: RENAL / URINARY TRACT ULTRASOUND COMPLETE COMPARISON:  None. FINDINGS: Right Kidney: Length: 12.8 cm. Echogenicity within normal limits. No mass or hydronephrosis visualized. Left Kidney: Length: 11.0 cm. Echogenicity within normal limits. No mass or  hydronephrosis visualized. Bladder: Decompressed by Foley catheter. Incidentally noted sludge in the gallbladder.  IMPRESSION: Normal renal echogenicity. No hydronephrosis or renal mass identified. Mild left kidney atrophy. Gallbladder sludge. Electronically Signed   By: Kristine Garbe M.D.   On: 04/25/2017 16:10   Dg Chest Port 1 View  Result Date: 04/26/2017 CLINICAL DATA:  Possible aspiration. EXAM: PORTABLE CHEST 1 VIEW COMPARISON:  04/25/2017 FINDINGS: Endotracheal tube with tip measuring 6.7 cm above the carina. Enteric tube tip is off the field of view but below the left hemidiaphragm. Right central venous catheter with tip over the low SVC region. The patient is rotated, limiting the examination. Shallow inspiration. Cardiac enlargement without vascular congestion. Bilateral perihilar and basilar airspace disease may represent edema or infiltration. Can't exclude aspiration in the appropriate clinical setting. The opacities are improving since previous study. No blunting of costophrenic angles. No pneumothorax. IMPRESSION: Appliances appear in satisfactory location. Shallow inspiration. Infiltration or atelectasis in the perihilar and basilar regions bilaterally, improved since previous study. Cardiac enlargement. Electronically Signed   By: Lucienne Capers M.D.   On: 04/26/2017 23:20    EKG:   Orders placed or performed during the hospital encounter of 04/19/17  . ED EKG  . ED EKG  . EKG 12-Lead  . EKG 12-Lead  . EKG    ASSESSMENT AND PLAN:   71 year old female, morbidly obese with past medical history significant for chronic diastolic heart failure, hypertension, diabetes and CK D stage III presents to hospital secondary to worsening shortness of breath.  * Acute on chronic hypoxic respiratory failure' acute on chronic diastolic heart failure, pneumonia,wean  Sedation as tolerated, likely extubation tomorrow..  . Nephrology is consulted for hypernatremia, hypokalemia and  hyppernatremia, hypokalemia improving at this time.,: -  -#2 acute on chronic chronic kidney disease stage III with baseline GFR 35: Not a candidate for hemodialysis at this time.  nephrology is following. Monitor urine output, Stopped  IV diuretics . Renal function is improving.  Acute renal failure likely due to hypotension: Renal ultrasound is negative for any hydronephrosis or obstruction.   * * Insulin-dependent diabetes mellitus. Continue insulin drip.  * CKD stage III is stable.   * Acute anemia  hbof 6 yesterday. Patient received 1 unit of packed RBC transfusion, hemoglobin improved to 7.3, no further workup at this time  As per gastroenterology..  All the records are reviewed and case discussed with Care Management/Social Workerr. Management plans discussed with the patient, family and they are in agreement.  CODE STATUS: Full Code  TOTAL TIME TAKING CARE OF THIS PATIENT: 36 minutes.   POSSIBLE D/C IN 2-3 DAYS, DEPENDING ON CLINICAL CONDITION.   Epifanio Lesches M.D on 04/27/2017 at 2:02 PM  Between 7am to 6pm - Pager - [phone]  After 6pm go to [email or web address] - password [email or web address]  `[facility]  Office  [phone]  CC: Primary care physician; Hermelinda Dellen, DO

## 2017-04-27 NOTE — Progress Notes (Signed)
Placed patient into PSV and titrated Pressure support to obtain tidal volumes in the 334ml range, settings are currently psv 16/5 and 40% fio2.Marland Kitchen ETCO2 is 48 at this time and patient is tol well. Rn notifed and will continue to monitor.

## 2017-04-27 NOTE — Progress Notes (Signed)
Nutrition Follow-up  DOCUMENTATION CODES:   Obesity unspecified  INTERVENTION:  If patient remains intubated, recommend resuming Vital High Protein at 40 ml/hr (960 ml goal daily volume) + Pro-Stat 60 ml BID via OGT. Provides 1360 kcal, 144 grams of protein, 806 ml H2O daily.  Per chart Nephrology will be ordering free water today in setting of hypernatremia if patient remains intubated. Patient not currently on any IV fluids. Minimum free water programmed on pump is 30 ml/hr Q4hrs just to prevent clogging of tube.  Provide liquid multivitamin with minerals daily per tube as goal TF regimen does not meet 100% RDIs for vitamins/minerals.  NUTRITION DIAGNOSIS:   Inadequate oral intake related to inability to eat as evidenced by NPO status.  Ongoing.  GOAL:   Provide needs based on ASPEN/SCCM guidelines  Not met - if patient does not extubate recommend resuming tube feeds at goal rate.  MONITOR:   I & O's, Labs, TF tolerance, Vent status, Weight trends  REASON FOR ASSESSMENT:   Ventilator    ASSESSMENT:   71 yo morbidly obese female with PMH of CHF, HTN, Diabetes, CKD IV, presents with respiratory distress, worsening shortness of breath. Was on BiPAP but unable to maintain respiratory status, was intubated yesterday.   -Nephrology following in setting of ARF on CKD III. Patient has not been on dialysis/CRRT. Per note today Nephrologist will order free water if patient is not extubated today.  Patient remains intubated and sedated.  Access: OGT placed 10/11; verified to terminate in stomach per abdominal x-ray 10/14; 58 cm at corner of mouth  MAP: 63-88 mmHg  TF: off at time of assessment; per RN was held overnight due to concern of tube feeds being found on suctioning but chest x-ray and abdominal x-ray were unchanged; per pump history patient received 480 ml of Vital High Protein in the past 24 hours (50% goal daily volume); appears Pro-Stat may not have been  ordered  Patient is currently intubated on ventilator support MV: 7.5 L/min Temp (24hrs), Avg:98.7 F (37.1 C), Min:97 F (36.1 C), Max:100.6 F (38.1 C)  Propofol: N/A  Medications reviewed and include: Colace, sennosides, Carafate 1 gram TID, fentanyl gtt, insulin gtt, Zosyn, vancomycin.  Labs reviewed: CBG 121-171 today, Sodium 147, CO2 36, BUN 63, Creatinine 1.81.  I/O: 1185 ml UOP yesterday (0.4 ml/kg/hr - inadequate)  Weight trend: 140.9 kg on 10/14; +2.6 kg from admission weight  Discussed with RN. Also discussed on rounds. Plan is for another SBT today so tube feeds will continue to be held.  Diet Order:  Diet NPO time specified  Skin:  Reviewed, no issues  Last BM:  04/25/2017 - smear type 6  Height:   Ht Readings from Last 1 Encounters:  04/19/17 _0  (1.651 m)    Weight:   Wt Readings from Last 1 Encounters:  04/26/17 (!) 310 lb 10.1 oz (140.9 kg)    Ideal Body Weight:  56.81 kg  BMI:  Body mass index is 51.69 kg/m.  Estimated Nutritional Needs:   Kcal:  1250-1420 calories (IBW x22-25 cal/kg IBW)  Protein:  >/= 142 grams (2.5g/kg IBW)  Fluid:  1.4-1.7 L/day (25-30 ml/kg IBW)  EDUCATION NEEDS:   No education needs identified at this time  Willey Blade, [place], [place], [place] Office: [phone] Pager: [phone] After Hours/Weekend Pager: [phone]

## 2017-04-27 NOTE — Progress Notes (Signed)
Increased to 40% due to sats dropping to 82%.

## 2017-04-27 NOTE — Discharge Instructions (Signed)
Heart Failure Clinic appointment on May 11 2017 at 9:00am with Darylene Price, [place]. Please call [phone] to reschedule.

## 2017-04-27 NOTE — Progress Notes (Signed)
Inpatient Diabetes Program Recommendations  AACE/ADA: New Consensus Statement on Inpatient Glycemic Control (2015)  Target Ranges:  Prepandial:   less than 140 mg/dL      Peak postprandial:   less than 180 mg/dL (1-2 hours)      Critically ill patients:  140 - 180 mg/dL   Results for Veronica Anderson, Veronica Anderson (MRN [id number]) as of 04/27/2017 07:59  Ref. Range 04/27/2017 00:12 04/27/2017 01:16 04/27/2017 02:01 04/27/2017 03:11 04/27/2017 04:20 04/27/2017 05:21 04/27/2017 06:26 04/27/2017 07:24  Glucose-Capillary Latest Ref Range: 65 - 99 mg/dL 151 (H) 147 (H) 145 (H) 152 (H) 132 (H) 150 (H) 137 (H) 138 (H)    Home DM Meds: Lantus 50 units daily       Humalog 5 units TID  Current Insulin Orders: IV Insulin drip per ICU Glycemic Control Protocol       MD- Per the ICU Glycemic Control Protocol, patient may likely be ready to transition to SQ Insulin today.  Patient has had:  6 consecutive CBGs <180 mg/dl  Tube feeds are at goal (40 cc/hour) Insulin drip rates are <4 units/hour  Please initiate Phase 3 of the ICU Glycemic Control Protocol and have the RN begin Lantus and Novolog per the Transition Parameters/ Instructions      --Will follow patient during hospitalization--  Wyn Quaker RN, MSN, CDE Diabetes Coordinator Inpatient Glycemic Control Team Team Pager: [phone] (8a-5p)

## 2017-04-27 NOTE — Progress Notes (Signed)
[facility] Progess Note    ASSESSMENT  Acute on chronic hypoxic respiratory failure Acute and chronic heart failure Acute on chronic kidney injury-Improving Hypernatremia Anemia Hypoalbuminemia  PLAN Weaning from ventilator as tolerated. Maintain saturation above 90%. Monitor urine output and creatinine closely. Appreciate nephrology input. Monitor mental status closely. Free water replacement as needed. No indication for transfusion. Full code. No family at bedside.  CC: Veronica Evelean Bigler MD PCCM    SUBJECTIVE:  Patient seen and examined at bedside. Noted events overnight.  Net Positive fluid balance overnight.  VITAL SIGNS: Temp:  [97 F (36.1 C)-100.6 F (38.1 C)] 99.5 F (37.5 C) (10/15 0820) Pulse Rate:  [62-81] 63 (10/15 0800) Resp:  [12-17] 15 (10/15 0800) BP: (116-155)/(34-119) 135/48 (10/15 0800) SpO2:  [95 %-100 %] 100 % (10/15 0800) FiO2 (%):  [40 %] 40 % (10/15 0400)   PHYSICAL EXAMINATION: Physical Examination:   VS: BP (!) 135/48   Pulse 63   Temp 99.5 F (37.5 C) (Axillary)   Resp 15   Ht 5' 5"  (1.651 m)   Wt (!) 310 lb 10.1 oz (140.9 kg)   SpO2 100%   BMI 51.69 kg/m    General Appearance: Intubated and sedated Neuro: Sedated HEENT: Intubated Pulmonary: Decreased air entry bilaterally Cardiovascular :  S1,S2.  No m/r/g.   Abdomen: Benign, Soft, non-tender, no organomegaly, bs+ Extremities: Edema +  LABORATORY PANEL:   CBC  Recent Labs Lab 04/27/17 0538  WBC 13.8*  HGB 7.6*  HCT 24.0*  PLT 232    Chemistries   Recent Labs Lab 04/26/17 1058 04/27/17 0538  NA 146* 147*  K 3.8 3.5  CL 98* 102  CO2 37* 36*  GLUCOSE 182* 141*  BUN 63* 63*  CREATININE 2.08* 1.81*  CALCIUM 7.8* 7.8*  MG 2.1  --   PHOS 3.0  --      Recent Labs Lab 04/27/17 0201 04/27/17 0311 04/27/17 0420 04/27/17 0521 04/27/17 0626 04/27/17 0724  GLUCAP 145* 152* 132* 150* 137* 138*    Recent Labs Lab  04/23/17 1220 04/23/17 1640 04/23/17 2045  PHART 7.35 7.33* 7.54*  PCO2ART 100* 105* 60*  PO2ART 57* 79* 109*   No results for input(s): AST, ALT, ALKPHOS, BILITOT, ALBUMIN in the last 168 hours.  Cardiac Enzymes No results for input(s): TROPONINI in the last 168 hours.  RADIOLOGY:  Dg Abd 1 View  Result Date: 04/26/2017 CLINICAL DATA:  Constipation.  OG tube placement. EXAM: ABDOMEN - 1 VIEW COMPARISON:  04/23/2017 FINDINGS: Enteric tube tip is in the left upper quadrant consistent with location in the body of the stomach. No small or large bowel distention is appreciated. Scattered stool throughout the colon. Calcifications in the pelvis likely represent uterine fibroids. Vascular calcifications. IMPRESSION: Enteric tube tip is in the left upper quadrant consistent with location in the body of stomach. Nonobstructive bowel gas pattern. Electronically Signed   By: Lucienne Capers M.D.   On: 04/26/2017 23:21   US Renal  Result Date: 04/25/2017 CLINICAL DATA:  71 y/o  F; acute renal failure. EXAM: RENAL / URINARY TRACT ULTRASOUND COMPLETE COMPARISON:  None. FINDINGS: Right Kidney: Length: 12.8 cm. Echogenicity within normal limits. No mass or hydronephrosis visualized. Left Kidney: Length: 11.0 cm. Echogenicity within normal limits. No mass or hydronephrosis visualized. Bladder: Decompressed by Foley catheter. Incidentally noted sludge in the gallbladder. IMPRESSION: Normal renal echogenicity. No hydronephrosis or renal mass identified. Mild left kidney atrophy. Gallbladder sludge. Electronically Signed  By: Kristine Garbe M.D.   On: 04/25/2017 16:10   Dg Chest Port 1 View  Result Date: 04/26/2017 CLINICAL DATA:  Possible aspiration. EXAM: PORTABLE CHEST 1 VIEW COMPARISON:  04/25/2017 FINDINGS: Endotracheal tube with tip measuring 6.7 cm above the carina. Enteric tube tip is off the field of view but below the left hemidiaphragm. Right central venous catheter with tip over the  low SVC region. The patient is rotated, limiting the examination. Shallow inspiration. Cardiac enlargement without vascular congestion. Bilateral perihilar and basilar airspace disease may represent edema or infiltration. Can't exclude aspiration in the appropriate clinical setting. The opacities are improving since previous study. No blunting of costophrenic angles. No pneumothorax. IMPRESSION: Appliances appear in satisfactory location. Shallow inspiration. Infiltration or atelectasis in the perihilar and basilar regions bilaterally, improved since previous study. Cardiac enlargement. Electronically Signed   By: Lucienne Capers M.D.   On: 04/26/2017 23:20      Current Facility-Administered Medications:  .  0.9 %  sodium chloride infusion, , Intravenous, Once, Tukov, Magadalene S, NP, Stopped at 04/25/17 1130 .  acetaminophen (TYLENOL) tablet 650 mg, 650 mg, Oral, Q6H PRN, 650 mg at 04/26/17 0953 **OR** acetaminophen (TYLENOL) suppository 650 mg, 650 mg, Rectal, Q6H PRN, Sudini, Srikar, MD .  chlorhexidine gluconate (MEDLINE KIT) (PERIDEX) 0.12 % solution 15 mL, 15 mL, Mouth Rinse, BID, Dimas Chyle, MD, 15 mL at 04/26/17 2000 .  cloNIDine (CATAPRES - Dosed in mg/24 hr) patch 0.1 mg, 0.1 mg, Transdermal, Weekly, Awilda Bill, NP, 0.1 mg at 04/21/17 0051 .  docusate (COLACE) 50 MG/5ML liquid 100 mg, 100 mg, Oral, BID, Dimas Chyle, MD, 100 mg at 04/26/17 2333 .  enoxaparin (LOVENOX) injection 40 mg, 40 mg, Subcutaneous, Q12H, Sudini, Srikar, MD, 40 mg at 04/26/17 2246 .  feeding supplement (VITAL HIGH PROTEIN) liquid 1,000 mL, 1,000 mL, Per Tube, Q24H, Dimas Chyle, MD, Stopped at 04/26/17 2230 .  fentaNYL (SUBLIMAZE) bolus via infusion 25 mcg, 25 mcg, Intravenous, Q1H PRN, Varughese, Bincy S, NP .  fentaNYL 2540mg in NS 2575m(1043mml) infusion-PREMIX, 25-400 mcg/hr, Intravenous, Continuous, Varughese, Bincy S, NP, Last Rate: 5 mL/hr at 04/27/17 0600, 50 mcg/hr at 04/27/17 0600 .  gabapentin  (NEURONTIN) capsule 300 mg, 300 mg, Oral, TID, Sudini, Srikar, MD, 300 mg at 04/26/17 2334 .  hydrALAZINE (APRESOLINE) injection 20 mg, 20 mg, Intravenous, Q2H PRN, KasFlora LippsD, 20 mg at 04/20/17 2312 .  insulin regular (NOVOLIN R,HUMULIN R) 100 Units in sodium chloride 0.9 % 100 mL (1 Units/mL) infusion, , Intravenous, Continuous, GupDimas ChyleD, Last Rate: 2 mL/hr at 04/27/17 0738, 2 Units/hr at 04/27/17 0738 .  ipratropium-albuterol (DUONEB) 0.5-2.5 (3) MG/3ML nebulizer solution 3 mL, 3 mL, Nebulization, Q6H PRN, Tukov, Magadalene S, NP, 3 mL at 04/19/17 2103 .  ipratropium-albuterol (DUONEB) 0.5-2.5 (3) MG/3ML nebulizer solution 3 mL, 3 mL, Nebulization, Q6H, Kalisetti, Radhika, MD, 3 mL at 04/27/17 0252 .  MEDLINE mouth rinse, 15 mL, Mouth Rinse, 10 times per day, GupDimas ChyleD, 15 mL at 04/27/17 0600 .  midazolam (VERSED) injection 1 mg, 1 mg, Intravenous, Q15 min PRN, Varughese, Bincy S, NP, 1 mg at 04/24/17 1740 .  midazolam (VERSED) injection 1 mg, 1 mg, Intravenous, Q2H PRN, Varughese, Bincy S, NP .  montelukast (SINGULAIR) tablet 10 mg, 10 mg, Oral, Daily, Sudini, Srikar, MD, 10 mg at 04/26/17 2335 .  norepinephrine (LEVOPHED) 4 mg in dextrose 5 % 250 mL (0.016 mg/mL) infusion, 0-40 mcg/min, Intravenous, Titrated, KasFlora LippsD, Stopped  at 04/25/17 2204 .  ondansetron (ZOFRAN) injection 4 mg, 4 mg, Intravenous, Q6H PRN, Flora Lipps, MD, 4 mg at 04/23/17 1011 .  piperacillin-tazobactam (ZOSYN) IVPB 4.5 g, 4.5 g, Intravenous, Q8H, Tukov, Magadalene S, NP, Last Rate: 200 mL/hr at 04/27/17 0818, 4.5 g at 04/27/17 0818 .  polyethylene glycol (MIRALAX / GLYCOLAX) packet 17 g, 17 g, Oral, Daily PRN, Hillary Bow, MD, 17 g at 04/26/17 2246 .  pravastatin (PRAVACHOL) tablet 40 mg, 40 mg, Oral, q1800, Sudini, Srikar, MD, 40 mg at 04/26/17 1700 .  sennosides (SENOKOT) 8.8 MG/5ML syrup 5 mL, 5 mL, Oral, BID, Dimas Chyle, MD, 5 mL at 04/26/17 2335 .  sodium chloride flush (NS) 0.9 %  injection 10-40 mL, 10-40 mL, Intracatheter, Q12H, Dimas Chyle, MD, 10 mL at 04/26/17 2247 .  sodium chloride flush (NS) 0.9 % injection 10-40 mL, 10-40 mL, Intracatheter, PRN, Dimas Chyle, MD .  sodium chloride flush (NS) 0.9 % injection 3 mL, 3 mL, Intravenous, Q12H, Sudini, Srikar, MD, 3 mL at 04/26/17 2248 .  sucralfate (CARAFATE) 1 GM/10ML suspension 1 g, 1 g, Per Tube, TID, Awilda Bill, NP, 1 g at 04/26/17 2246 .  vancomycin (VANCOCIN) IVPB 1000 mg/200 mL premix, 1,000 mg, Intravenous, Q24H, [place], [place], [facility]  Dimas Chyle MD   04/27/2017

## 2017-04-27 NOTE — Progress Notes (Signed)
Decreased to 30% 

## 2017-04-27 NOTE — Progress Notes (Signed)
PULMONARY / CRITICAL CARE MEDICINE   Name: Veronica Anderson MRN: [id number] DOB: [birth year]    ADMISSION DATE:  04/19/2017  PT PROFILE: 53 F adm 10/07 to hospitalist service with diagnosis of acute on chronic diastolic congestive heart failure, diabetes, stage III CKD. Admitted to SDU requiring BiPAP. Seen in consultation by PCCM at that time. Underwent intubation 04/23/17 for worsening respiratory failure  MAJOR EVENTS/TEST RESULTS: 10/07 Admission 10/07 PCCM consultation 10/12 nephrology consultation 10/12 CT chest: Patchy left lower lobe opacity, suspicious for pneumonia. Additional mild patchy opacity in the posterior right upper lobe and right middle lobe, atelectasis versus pneumonia. Moderate bilateral pleural effusions 10/13 transfused 2 units PRBCs for hemoglobin 6.8, 6.6 10/15 gastroenterology consultation - no endoscopy planned  INDWELLING DEVICES:: ETT 10/11 >>  R IJ CVL 10/11 >>   MICRO DATA: MRSA PCR 10/07 >> POS   ANTIMICROBIALS:  Vanc 10/14 >> 10/15 Pip-tazo 10/14 >> 10/15   SUBJECTIVE:  RASS 0. + F/C. Tolerates PSV 16 cm H2O.   VITAL SIGNS: BP (!) 131/47   Pulse 70   Temp 99.2 F (37.3 C) (Axillary)   Resp 17   Ht 5\' 5"  (1.651 m)   Wt (!) 140.9 kg (310 lb 10.1 oz)   SpO2 97%   BMI 51.69 kg/m   HEMODYNAMICS:    VENTILATOR SETTINGS: Vent Mode: PSV FiO2 (%):  [40 %] 40 % Set Rate:  [15 bmp] 15 bmp Vt Set:  [500 mL] 500 mL PEEP:  [5 cmH20] 5 cmH20 Pressure Support:  [16 cmH20-17 cmH20] 16 cmH20 Plateau Pressure:  [18 cmH20-24 cmH20] 20 cmH20  INTAKE / OUTPUT: I/O last 3 completed shifts: In: 2525.4 [I.V.:535.4; Blood:400; NG/GT:1440; IV Piggyback:150] Out: 1660 [Urine:1660]  PHYSICAL EXAMINATION: General: RASS 0, + F/C Neuro: cognition intact, CNs intact, no focal deficits HEENT: NCAT, sclerae white Cardiovascular: regular, no M Lungs: diffuse expiratory wheezes Abdomen: obese, soft, NABS Extremities: No edema  LABS:  BMET  Recent  Labs Lab 04/26/17 0542 04/26/17 1058 04/27/17 0538  NA 144 146* 147*  K 3.4* 3.8 3.5  CL 98* 98* 102  CO2 36* 37* 36*  BUN 59* 63* 63*  CREATININE 2.16* 2.08* 1.81*  GLUCOSE 180* 182* 141*    Electrolytes  Recent Labs Lab 04/23/17 1211 04/24/17 0608 04/25/17 0849 04/26/17 0542 04/26/17 1058 04/27/17 0538  CALCIUM 8.5* 8.3* 7.7* 7.6* 7.8* 7.8*  MG 2.2 2.0  --   --  2.1  --   PHOS  --  1.6* 3.8  --  3.0  --     CBC  Recent Labs Lab 04/26/17 1058 04/26/17 2244 04/27/17 0538  WBC 11.6* 11.9* 13.8*  HGB 6.8* 6.6* 7.6*  HCT 22.8* 22.4* 24.0*  PLT 221 221 232    Coag's No results for input(s): APTT, INR in the last 168 hours.  Sepsis Markers No results for input(s): LATICACIDVEN, PROCALCITON, O2SATVEN in the last 168 hours.  ABG  Recent Labs Lab 04/23/17 1220 04/23/17 1640 04/23/17 2045  PHART 7.35 7.33* 7.54*  PCO2ART 100* 105* 60*  PO2ART 57* 79* 109*    Liver Enzymes No results for input(s): AST, ALT, ALKPHOS, BILITOT, ALBUMIN in the last 168 hours.  Cardiac Enzymes No results for input(s): TROPONINI, PROBNP in the last 168 hours.  Glucose  Recent Labs Lab 04/27/17 1100 04/27/17 1210 04/27/17 1318 04/27/17 1427 04/27/17 1531 04/27/17 1623  GLUCAP 171* 180* 173* 199* 203* 198*    CXR: CM, edema pattern   ASSESSMENT / PLAN:  PULMONARY A: Acute  respiratory failure Pulmonary edema Doubt PNA P:   Cont vent support - settings reviewed and/or adjusted Wean in PSV as tolerated Cont vent bundle Daily SBT if/when meets criteria   CARDIOVASCULAR A:  HFpEF Hypertension P:  Hydralazine as needed to to maintain SBP < 170 mmHg  RENAL A:   CKD AKI - creatinine improving Hypernatremia P:   Monitor BMET intermittently Monitor I/Os Correct electrolytes as indicated   GASTROINTESTINAL A:   Obesity P:   SUP: IV PPI Continue TF protocol  HEMATOLOGIC A:   Acute blood loss anemia - no overt bleeding  presently microcytosis P:  DVT px: SCDs Monitor CBC intermittently Transfuse per usual guidelines   INFECTIOUS A:   Doubt pneumonia No definite infection identified P:   Monitor temp, WBC count Micro and abx as above   ENDOCRINE A:   DM 2 With severe hyperglycemia P:   Transition from insulin infusion to Lantus plus SSI 10/15  NEUROLOGIC A:   ICU/ventilator associated discomfort P:   RASS goal: 0, -1 PAD protocol - Intermittent fentanyl, midazolam   FAMILY:   CCM time: 40 mins The above time includes time spent in consultation with patient and/or family members and reviewing care plan on multidisciplinary rounds  Merton Border, MD PCCM service Mobile [phone] Pager [phone] 04/27/2017, 5:08 PM

## 2017-04-28 ENCOUNTER — Inpatient Hospital Stay: Payer: Medicare Other

## 2017-04-28 ENCOUNTER — Ambulatory Visit: Payer: Self-pay | Admitting: Family

## 2017-04-28 DIAGNOSIS — D62 Acute posthemorrhagic anemia: Secondary | ICD-10-CM

## 2017-04-28 DIAGNOSIS — J9601 Acute respiratory failure with hypoxia: Secondary | ICD-10-CM

## 2017-04-28 DIAGNOSIS — J9602 Acute respiratory failure with hypercapnia: Secondary | ICD-10-CM

## 2017-04-28 DIAGNOSIS — N179 Acute kidney failure, unspecified: Secondary | ICD-10-CM

## 2017-04-28 DIAGNOSIS — I509 Heart failure, unspecified: Secondary | ICD-10-CM

## 2017-04-28 LAB — COMPREHENSIVE METABOLIC PANEL
ALK PHOS: 67 U/L (ref 38–126)
ALT: 15 U/L (ref 14–54)
AST: 23 U/L (ref 15–41)
Albumin: 2 g/dL — ABNORMAL LOW (ref 3.5–5.0)
Anion gap: 9 (ref 5–15)
BUN: 57 mg/dL — ABNORMAL HIGH (ref 6–20)
CALCIUM: 7.8 mg/dL — AB (ref 8.9–10.3)
CO2: 34 mmol/L — ABNORMAL HIGH (ref 22–32)
CREATININE: 1.53 mg/dL — AB (ref 0.44–1.00)
Chloride: 102 mmol/L (ref 101–111)
GFR, EST AFRICAN AMERICAN: 38 mL/min — AB (ref >60)
GFR, EST NON AFRICAN AMERICAN: 33 mL/min — AB (ref >60)
Glucose, Bld: 207 mg/dL — ABNORMAL HIGH (ref 65–99)
Potassium: 3.2 mmol/L — ABNORMAL LOW (ref 3.5–5.1)
Sodium: 145 mmol/L (ref 135–145)
Total Bilirubin: 0.7 mg/dL (ref 0.3–1.2)
Total Protein: 6.6 g/dL (ref 6.5–8.1)

## 2017-04-28 LAB — GLUCOSE, CAPILLARY
GLUCOSE-CAPILLARY: 185 mg/dL — AB (ref 65–99)
GLUCOSE-CAPILLARY: 208 mg/dL — AB (ref 65–99)
GLUCOSE-CAPILLARY: 184 mg/dL — AB (ref 65–99)
Glucose-Capillary: 195 mg/dL — ABNORMAL HIGH (ref 65–99)
Glucose-Capillary: 187 mg/dL — ABNORMAL HIGH (ref 65–99)

## 2017-04-28 LAB — CBC
HEMATOCRIT: 23.5 % — AB (ref 35.0–47.0)
HEMOGLOBIN: 7.2 g/dL — AB (ref 12.0–16.0)
MCH: 22.8 pg — AB (ref 26.0–34.0)
MCHC: 30.8 g/dL — ABNORMAL LOW (ref 32.0–36.0)
MCV: 74.2 fL — AB (ref 80.0–100.0)
Platelets: 258 10*3/uL (ref 150–440)
RBC: 3.17 MIL/uL — AB (ref 3.80–5.20)
RDW: 20.8 % — ABNORMAL HIGH (ref 11.5–14.5)
WBC: 11.1 10*3/uL — ABNORMAL HIGH (ref 3.6–11.0)

## 2017-04-28 LAB — BRAIN NATRIURETIC PEPTIDE: B NATRIURETIC PEPTIDE 5: 122 pg/mL — AB (ref 0.0–100.0)

## 2017-04-28 MED ORDER — PANTOPRAZOLE SODIUM 40 MG IV SOLR
40.0000 mg | INTRAVENOUS | Status: DC
  Administered 2017-04-29 – 2017-04-30 (×2): 40 mg via INTRAVENOUS
  Filled 2017-04-28 (×2): qty 40

## 2017-04-28 MED ORDER — FREE WATER
200.0000 mL | Freq: Three times a day (TID) | Status: DC
  Administered 2017-04-28 (×2): 200 mL

## 2017-04-28 MED ORDER — POTASSIUM CHLORIDE 10 MEQ/50ML IV SOLN
10.0000 meq | INTRAVENOUS | Status: DC
  Administered 2017-04-28 (×4): 10 meq via INTRAVENOUS
  Filled 2017-04-28 (×4): qty 50

## 2017-04-28 MED ORDER — POTASSIUM CHLORIDE 20 MEQ PO PACK: 60.0000 meq | PACK | Freq: Once | ORAL | Status: DC

## 2017-04-28 MED ORDER — PANTOPRAZOLE SODIUM 40 MG IV SOLR: 40.0000 mg | INTRAVENOUS | Status: DC

## 2017-04-28 NOTE — Progress Notes (Signed)
Pt. ETT was advanced to 23 at the lip per [facility].

## 2017-04-28 NOTE — Progress Notes (Signed)
Sad day . Patient very upset about  possibility of trach and peg in her future. Unable to extubate due to high pressure support on ventilator. Patient very alert and able to communicate needs.

## 2017-04-28 NOTE — Progress Notes (Signed)
Notified Bincy NP of 8 beats of VTach observed on Cardiac monitor. Will monitor closely and recheck labs in morning.

## 2017-04-28 NOTE — Progress Notes (Signed)
PULMONARY / CRITICAL CARE MEDICINE   Name: Veronica Anderson MRN: [id number] DOB: 01-21-1946    ADMISSION DATE:  04/19/2017  PT PROFILE: 67 F adm 10/07 to hospitalist service with diagnosis of acute on chronic diastolic congestive heart failure, diabetes, stage III CKD. Admitted to SDU requiring BiPAP. Seen in consultation by PCCM at that time. Underwent intubation 04/23/17 for worsening respiratory failure  MAJOR EVENTS/TEST RESULTS: 10/07 Admission 10/07 PCCM consultation 10/12 nephrology consultation 10/12 CT chest: Patchy left lower lobe opacity, suspicious for pneumonia. Additional mild patchy opacity in the posterior right upper lobe and right middle lobe, atelectasis versus pneumonia. Moderate bilateral pleural effusions 10/13 transfused 2 units PRBCs for hemoglobin 6.8, 6.6 10/15 gastroenterology consultation - no endoscopy planned  INDWELLING DEVICES:: ETT 10/11 >>  R IJ CVL 10/11 >>   MICRO DATA: MRSA PCR 10/07 >> POS   ANTIMICROBIALS:  Vanc 10/14 >> 10/15 Pip-tazo 10/14 >> 10/15   SUBJECTIVE:  RASS 0. + F/C. Failed weaning trial today-high pressure support requirements. Fully awake. Offers no other complaints  VITAL SIGNS: BP (!) 151/56   Pulse 64   Temp 98.4 F (36.9 C)   Resp (!) 22   Ht 5\' 5"  (1.651 m)   Wt 286 lb 13.1 oz (130.1 kg)   SpO2 98%   BMI 47.73 kg/m   HEMODYNAMICS:    VENTILATOR SETTINGS: Vent Mode: PSV FiO2 (%):  [40 %] 40 % PEEP:  [5 cmH20] 5 cmH20 Pressure Support:  [10 cmH20-16 cmH20] 10 cmH20  INTAKE / OUTPUT: I/O last 3 completed shifts: In: 3090.8 [I.V.:1090.8; Blood:400; NG/GT:1100; IV Piggyback:500] Out: 1885 [Urine:1885]  PHYSICAL EXAMINATION: General: RASS 0, + F/C Neuro: cognition intact, CNs intact, no focal deficits HEENT: NCAT, sclerae white Cardiovascular: regular, no M Lungs: diffuse expiratory wheezes Abdomen: obese, soft, +BS X 4 Extremities: No edema  LABS:  BMET  Recent Labs Lab 04/26/17 1058  04/27/17 0538 04/28/17 0340  NA 146* 147* 145  K 3.8 3.5 3.2*  CL 98* 102 102  CO2 37* 36* 34*  BUN 63* 63* 57*  CREATININE 2.08* 1.81* 1.53*  GLUCOSE 182* 141* 207*    Electrolytes  Recent Labs Lab 04/23/17 1211 04/24/17 0608 04/25/17 0849  04/26/17 1058 04/27/17 0538 04/28/17 0340  CALCIUM 8.5* 8.3* 7.7*  < > 7.8* 7.8* 7.8*  MG 2.2 2.0  --   --  2.1  --   --   PHOS  --  1.6* 3.8  --  3.0  --   --   < > = values in this interval not displayed.  CBC  Recent Labs Lab 04/26/17 2244 04/27/17 0538 04/28/17 0340  WBC 11.9* 13.8* 11.1*  HGB 6.6* 7.6* 7.2*  HCT 22.4* 24.0* 23.5*  PLT 221 232 258    Coag's No results for input(s): APTT, INR in the last 168 hours.  Sepsis Markers No results for input(s): LATICACIDVEN, PROCALCITON, O2SATVEN in the last 168 hours.  ABG  Recent Labs Lab 04/23/17 1220 04/23/17 1640 04/23/17 2045  PHART 7.35 7.33* 7.54*  PCO2ART 100* 105* 60*  PO2ART 57* 79* 109*    Liver Enzymes  Recent Labs Lab 04/28/17 0340  AST 23  ALT 15  ALKPHOS 67  BILITOT 0.7  ALBUMIN 2.0*    Cardiac Enzymes No results for input(s): TROPONINI, PROBNP in the last 168 hours.  Glucose  Recent Labs Lab 04/27/17 1929 04/27/17 2329 04/28/17 0327 04/28/17 0723 04/28/17 1158 04/28/17 1633  GLUCAP 180* 179* 195* 187* 185* 208*  CXR: CM, edema pattern   ASSESSMENT / PLAN:  PULMONARY A: Acute respiratory failure Pulmonary edema Doubt PNA P:   Cont vent support-Tolerating pressure support; now down to 5/5 - settings reviewed and/or adjusted Wean in PSV as tolerated Cont vent bundle Daily SBT if/when meets criteria   CARDIOVASCULAR A:  HFpEF Hypertension P:  Hydralazine as needed to to maintain SBP < 170 mmHg Hemodynamic monitoring per ICU protocol  RENAL A:   CKD AKI - creatinine improving Hypernatremia-resolved Hypokalemia P:   Monitor BMET intermittently Monitor I/Os Correct electrolytes as indicated    GASTROINTESTINAL A:   Obesity P:   SUP: IV PPI Continue TF protocol  HEMATOLOGIC A:   Acute blood loss anemia - no overt bleeding presently microcytosis P:  DVT px: SCDs Monitor CBC intermittently Transfuse per usual guidelines   INFECTIOUS A:   Doubt pneumonia No definite infection identified P:   Monitor temp, WBC count Micro and abx as above   ENDOCRINE A:   DM 2 With severe hyperglycemia P:   Transition from insulin infusion to Lantus plus SSI 10/15  NEUROLOGIC A:   ICU/ventilator associated discomfort P:   RASS goal: 0, -1 PAD protocol - Intermittent fentanyl, midazolam   FAMILY: Family updated by Dr. Alva Garnet and Nursing  [facility] ANP-BC Pulmonary and Critical Care Medicine [facility] Pager [phone] or [phone]  04/28/2017, 5:12 PM   Merton Border, MD PCCM service Mobile [phone] Pager [phone] 04/29/2017 4:07 PM

## 2017-04-28 NOTE — Care Management (Signed)
I have asked [facility] to review case for LTAC.

## 2017-04-28 NOTE — Progress Notes (Signed)
[facility]  ROUNDING NOTE   Subjective:   Family at bedside.   Creatinine 1.53 (1.81)  D5W at 16m/hr  Objective:  Vital signs in last 24 hours:  Temp:  [98.4 F (36.9 C)-99.5 F (37.5 C)] 98.4 F (36.9 C) (10/16 0514) Pulse Rate:  [64-79] 72 (10/16 0900) Resp:  [15-21] 19 (10/16 0900) BP: (130-163)/(43-107) 161/65 (10/16 0900) SpO2:  [92 %-100 %] 98 % (10/16 0900) FiO2 (%):  [40 %] 40 % (10/16 0817) Weight:  [130.1 kg (286 lb 13.1 oz)] 130.1 kg (286 lb 13.1 oz) (10/16 0339)  Weight change:  Filed Weights   04/24/17 0329 04/26/17 0431 04/28/17 0339  Weight: (!) 137.3 kg (302 lb 11.1 oz) (!) 140.9 kg (310 lb 10.1 oz) 130.1 kg (286 lb 13.1 oz)    Intake/Output: I/O last 3 completed shifts: In: 3090.8 [I.V.:1090.8; Blood:400; NG/GT:1100; IV Piggyback:500] Out: 1885 [Urine:1885]   Intake/Output this shift:  Total I/O In: 170 [NG/GT:120; IV Piggyback:50] Out: 175 [Urine:175]  Physical Exam: General: critically ill appearing  Head: +ETT +OGT    Eyes: Anicteric  Neck: Right IJ central line  Lungs:  Mechanical ventilation PSV 40%  Heart: regular  Abdomen:  Soft, nontender   Extremities: trace peripheral and dependent edema.  Neurologic: Intubated, and sedated. Following commands off sedation  Skin: No lesions       Basic Metabolic Panel:  Recent Labs Lab 04/22/17 0539 04/23/17 1211 04/24/17 0608 04/25/17 0849 04/26/17 0542 04/26/17 1058 04/27/17 0538 04/28/17 0340  NA 148* 149* 149* 144 144 146* 147* 145  K 3.6 4.5 3.1* 4.1 3.4* 3.8 3.5 3.2*  CL 98* 99* 99* 97* 98* 98* 102 102  CO2 41* 36* 38* 36* 36* 37* 36* 34*  GLUCOSE 109* 134* 74 226* 180* 182* 141* 207*  BUN 29* 29* 30* 44* 59* 63* 63* 57*  CREATININE 1.77* 1.68* 1.65* 2.23* 2.16* 2.08* 1.81* 1.53*  CALCIUM 8.3* 8.5* 8.3* 7.7* 7.6* 7.8* 7.8* 7.8*  MG 1.7 2.2 2.0  --   --  2.1  --   --   PHOS  --   --  1.6* 3.8  --  3.0  --   --     Liver Function Tests:  Recent Labs Lab  04/28/17 0340  AST 23  ALT 15  ALKPHOS 67  BILITOT 0.7  PROT 6.6  ALBUMIN 2.0*   No results for input(s): LIPASE, AMYLASE in the last 168 hours. No results for input(s): AMMONIA in the last 168 hours.  CBC:  Recent Labs Lab 04/25/17 0515 04/25/17 1455 04/26/17 1058 04/26/17 2244 04/27/17 0538 04/28/17 0340  WBC 9.1  --  11.6* 11.9* 13.8* 11.1*  NEUTROABS 6.9*  --  9.1*  --   --   --   HGB 6.0* 7.3* 6.8* 6.6* 7.6* 7.2*  HCT 19.8* 24.3* 22.8* 22.4* 24.0* 23.5*  MCV 72.0*  --  73.3* 73.9* 73.8* 74.2*  PLT 206  --  221 221 232 258    Cardiac Enzymes: No results for input(s): CKTOTAL, CKMB, CKMBINDEX, TROPONINI in the last 168 hours.  BNP: Invalid input(s): POCBNP  CBG:  Recent Labs Lab 04/27/17 1623 04/27/17 1929 04/27/17 2329 04/28/17 0327 04/28/17 0723  GLUCAP 198* 180* 179* 195* 139    Microbiology: Results for orders placed or performed during the hospital encounter of 04/19/17  MRSA PCR Screening     Status: Abnormal   Collection Time: 04/19/17 10:07 AM  Result Value Ref Range Status   MRSA by PCR  POSITIVE (A) NEGATIVE Final    Comment:        The GeneXpert MRSA Assay (FDA approved for NASAL specimens only), is one component of a comprehensive MRSA colonization surveillance program. It is not intended to diagnose MRSA infection nor to guide or monitor treatment for MRSA infections. RESULT CALLED TO, READ BACK BY AND VERIFIED WITH: SANDRA BOREA ON 04/19/17 AT 1228 [facility]     Coagulation Studies: No results for input(s): LABPROT, INR in the last 72 hours.  Urinalysis:  Recent Labs  04/27/17 0952  COLORURINE YELLOW*  LABSPEC 1.017  PHURINE 7.0  GLUCOSEU NEGATIVE  HGBUR MODERATE*  BILIRUBINUR NEGATIVE  KETONESUR NEGATIVE  PROTEINUR 100*  NITRITE NEGATIVE  LEUKOCYTESUR LARGE*      Imaging: Dg Abd 1 View  Result Date: 04/26/2017 CLINICAL DATA:  Constipation.  OG tube placement. EXAM: ABDOMEN - 1 VIEW COMPARISON:  04/23/2017  FINDINGS: Enteric tube tip is in the left upper quadrant consistent with location in the body of the stomach. No small or large bowel distention is appreciated. Scattered stool throughout the colon. Calcifications in the pelvis likely represent uterine fibroids. Vascular calcifications. IMPRESSION: Enteric tube tip is in the left upper quadrant consistent with location in the body of stomach. Nonobstructive bowel gas pattern. Electronically Signed   By: Lucienne Capers M.D.   On: 04/26/2017 23:21   Dg Chest Port 1 View  Result Date: 04/28/2017 CLINICAL DATA:  71 year old female respiratory failure. Subsequent encounter. EXAM: PORTABLE CHEST 1 VIEW COMPARISON:  04/26/2017 chest x-ray. FINDINGS: Endotracheal tube tip 4.5 cm above the carina. Right central line tip distal superior vena cava. Nasogastric tube courses below the diaphragm. Tip is not included on the present exam. Rotation to the right with distortion of mediastinal and cardiac silhouette. Cardiomegaly. Diffuse airspace disease minimally changed from prior examination. Bilateral pleural effusions. Basilar atelectasis or infiltrates. IMPRESSION: Similar appearance of diffuse airspace disease which may represent pulmonary edema with bilateral pleural effusions and basal atelectasis. Infectious process, particularly in lower lobes not excluded in the proper clinical setting. Cardiomegaly. Electronically Signed   By: Genia Del M.D.   On: 04/28/2017 07:02   Dg Chest Port 1 View  Result Date: 04/26/2017 CLINICAL DATA:  Possible aspiration. EXAM: PORTABLE CHEST 1 VIEW COMPARISON:  04/25/2017 FINDINGS: Endotracheal tube with tip measuring 6.7 cm above the carina. Enteric tube tip is off the field of view but below the left hemidiaphragm. Right central venous catheter with tip over the low SVC region. The patient is rotated, limiting the examination. Shallow inspiration. Cardiac enlargement without vascular congestion. Bilateral perihilar and basilar  airspace disease may represent edema or infiltration. Can't exclude aspiration in the appropriate clinical setting. The opacities are improving since previous study. No blunting of costophrenic angles. No pneumothorax. IMPRESSION: Appliances appear in satisfactory location. Shallow inspiration. Infiltration or atelectasis in the perihilar and basilar regions bilaterally, improved since previous study. Cardiac enlargement. Electronically Signed   By: Lucienne Capers M.D.   On: 04/26/2017 23:20     Medications:   . dextrose 50 mL/hr at 04/27/17 1950   . budesonide (PULMICORT) nebulizer solution  0.25 mg Nebulization Q6H  . chlorhexidine gluconate (MEDLINE KIT)  15 mL Mouth Rinse BID  . cloNIDine  0.1 mg Transdermal Weekly  . docusate  100 mg Per Tube BID  . feeding supplement (PRO-STAT SUGAR FREE 64)  60 mL Per Tube BID  . feeding supplement (VITAL HIGH PROTEIN)  1,000 mL Per Tube Q24H  . gabapentin  300  mg Oral TID  . insulin aspart  0-20 Units Subcutaneous Q4H  . insulin aspart  4 Units Subcutaneous Q4H  . insulin glargine  50 Units Subcutaneous Daily  . ipratropium-albuterol  3 mL Nebulization Q6H  . mouth rinse  15 mL Mouth Rinse 10 times per day  . multivitamin  15 mL Oral Daily  . pantoprazole (PROTONIX) IV  40 mg Intravenous Q24H  . sennosides  5 mL Per Tube BID  . sodium chloride flush  10-40 mL Intracatheter Q12H  . sodium chloride flush  3 mL Intravenous Q12H   acetaminophen **OR** acetaminophen, fentaNYL (SUBLIMAZE) injection, hydrALAZINE, midazolam, ondansetron (ZOFRAN) IV, polyethylene glycol, sodium chloride flush  Assessment/ Plan:  71 y.o.black female with anemia, congestive heart failure, chronic kidney disease stage III, diabetes mellitus type 2, peripheral edema, GERD, hypertension, hyperlipidemia, peripheral neuropathy, obesity, history of CVA, obstructive sleep apnea  1. Acute renal failure on chronic kidney disease stage III Baseline creatinine of 1.65, GFR of 35  on 04/24/17. No urine studies available.  Chronic kidney disease secondary to diabetes, hypertension.  Acute renal failure secondary to overdiuresis - Currently holding furosemide - Nonoliguric urine output.     2. Hypernatremia: from free water deficit from overdiuresis.    3. Anemia with renal failure/chronic kidney disease: status post PRBC transfusion on 10/13 and 10/14.    LOS: 9 Gurinder Toral 10/16/201810:13 AM

## 2017-04-28 NOTE — Progress Notes (Addendum)
[place] [facility] [place] NAME: Veronica Anderson    MR#:  [id number]  DATE OF BIRTH:  [birth year]  SUBJECTIVE: Afebrile. Off sedation. Following commands STILL ON PRESSURE SUPPORT AT 10. DAILY WEANING TRIALS TO EXTUBATE BUT TODAY PRESSURE SUPPORT IS DECREASED FROM 15-10.  CHIEF COMPLAINT:   Chief Complaint  Patient presents with  . Respiratory Distress   -   REVIEW OF SYSTEMS:  Review of Systems  Unable to perform ROS: Acuity of condition  Constitutional: Positive for malaise/fatigue. Negative for chills and fever.  HENT: Negative for ear discharge, hearing loss and nosebleeds.   Respiratory: Positive for shortness of breath. Negative for cough and wheezing.   Cardiovascular: Negative for chest pain and palpitations.  Gastrointestinal: Negative for abdominal pain, constipation, diarrhea, nausea and vomiting.  Genitourinary: Negative for dysuria.  Musculoskeletal: Negative for myalgias and neck pain.  Neurological: Negative for dizziness, sensory change, speech change, focal weakness, seizures and headaches.  Psychiatric/Behavioral: Negative for depression.  : Alert and awake  DRUG ALLERGIES:   Allergies  Allergen Reactions  . Ace Inhibitors Cough    VITALS:  Blood pressure (!) 151/56, pulse 64, temperature 98.4 F (36.9 C), resp. rate (!) 22, height 5\' 5"  (1.651 m), weight 130.1 kg (286 lb 13.1 oz), SpO2 98 %.  PHYSICAL EXAMINATION:  Physical Exam  GENERAL:  71 y.o.-year-old obese patient lying in the bed , Currently intubated, Saturation is being weaned off.sedationbeing turned off. EYES: Pupils equal, round, reactive to light.. No scleral icterus. HEENT: Head atraumatic, normocephalic. Orally intubated. NECK:  Supple, no jugular venous distention. No thyroid enlargement, no tenderness.  LUNGS: Diminished breath sounds bilaterally. CARDIOVASCULAR: S1, S2 normal. No murmurs, rubs, or gallops.  ABDOMEN: Soft, nontender, nondistended.  Bowel sounds present. No organomegaly or mass.  EXTREMITIES: No pedal edema, cyanosis, or clubbing.  NEUROLOGIC:Unable to do neurological exam because of her intubation,  PSYCHIATRIC: The patient is alert and oriented x 3.  SKIN: No obvious rash, lesion, or ulcer.    LABORATORY PANEL:   CBC  Recent Labs Lab 04/28/17 0340  WBC 11.1*  HGB 7.2*  HCT 23.5*  PLT 258   ------------------------------------------------------------------------------------------------------------------  Chemistries   Recent Labs Lab 04/26/17 1058  04/28/17 0340  NA 146*  < > 145  K 3.8  < > 3.2*  CL 98*  < > 102  CO2 37*  < > 34*  GLUCOSE 182*  < > 207*  BUN 63*  < > 57*  CREATININE 2.08*  < > 1.53*  CALCIUM 7.8*  < > 7.8*  MG 2.1  --   --   AST  --   --  23  ALT  --   --  15  ALKPHOS  --   --  67  BILITOT  --   --  0.7  < > = values in this interval not displayed. ------------------------------------------------------------------------------------------------------------------  Cardiac Enzymes No results for input(s): TROPONINI in the last 168 hours. ------------------------------------------------------------------------------------------------------------------  RADIOLOGY:  Dg Abd 1 View  Result Date: 04/26/2017 CLINICAL DATA:  Constipation.  OG tube placement. EXAM: ABDOMEN - 1 VIEW COMPARISON:  04/23/2017 FINDINGS: Enteric tube tip is in the left upper quadrant consistent with location in the body of the stomach. No small or large bowel distention is appreciated. Scattered stool throughout the colon. Calcifications in the pelvis likely represent uterine fibroids. Vascular calcifications. IMPRESSION: Enteric tube tip is in the left upper quadrant consistent with location in the body of stomach.  Nonobstructive bowel gas pattern. Electronically Signed   By: Lucienne Capers M.D.   On: 04/26/2017 23:21   Dg Chest Port 1 View  Result Date: 04/28/2017 CLINICAL DATA:  71 year old female  respiratory failure. Subsequent encounter. EXAM: PORTABLE CHEST 1 VIEW COMPARISON:  04/26/2017 chest x-ray. FINDINGS: Endotracheal tube tip 4.5 cm above the carina. Right central line tip distal superior vena cava. Nasogastric tube courses below the diaphragm. Tip is not included on the present exam. Rotation to the right with distortion of mediastinal and cardiac silhouette. Cardiomegaly. Diffuse airspace disease minimally changed from prior examination. Bilateral pleural effusions. Basilar atelectasis or infiltrates. IMPRESSION: Similar appearance of diffuse airspace disease which may represent pulmonary edema with bilateral pleural effusions and basal atelectasis. Infectious process, particularly in lower lobes not excluded in the proper clinical setting. Cardiomegaly. Electronically Signed   By: Genia Del M.D.   On: 04/28/2017 07:02   Dg Chest Port 1 View  Result Date: 04/26/2017 CLINICAL DATA:  Possible aspiration. EXAM: PORTABLE CHEST 1 VIEW COMPARISON:  04/25/2017 FINDINGS: Endotracheal tube with tip measuring 6.7 cm above the carina. Enteric tube tip is off the field of view but below the left hemidiaphragm. Right central venous catheter with tip over the low SVC region. The patient is rotated, limiting the examination. Shallow inspiration. Cardiac enlargement without vascular congestion. Bilateral perihilar and basilar airspace disease may represent edema or infiltration. Can't exclude aspiration in the appropriate clinical setting. The opacities are improving since previous study. No blunting of costophrenic angles. No pneumothorax. IMPRESSION: Appliances appear in satisfactory location. Shallow inspiration. Infiltration or atelectasis in the perihilar and basilar regions bilaterally, improved since previous study. Cardiac enlargement. Electronically Signed   By: Lucienne Capers M.D.   On: 04/26/2017 23:20    EKG:   Orders placed or performed during the hospital encounter of 04/19/17  . ED  EKG  . ED EKG  . EKG 12-Lead  . EKG 12-Lead  . EKG    ASSESSMENT AND PLAN:   71 year old female, morbidly obese with past medical history significant for chronic diastolic heart failure, hypertension, diabetes and CK D stage III presents to hospital secondary to worsening shortness of breath.  * Acute on chronic hypoxic respiratory failure' acute on chronic diastolic heart failure, pneumonia,wean as tolerated. . Nephrology is consulted for hypernatremia, hypokalemia and hyppernatremia, hypokalemia improving at this time.,: -  -#2 acute on chronic chronic kidney disease stage III with baseline GFR 35: Not a candidate for hemodialysis at this time.  nephrology is following. Monitor urine output, Stopped  IV diuretics . Renal function is improving.  Acute renal failure likely due to hypotension: Renal ultrasound is negative for any hydronephrosis or obstruction.  Hypokalemia; Replace the potassium. * * Insulin-dependent diabetes mellitus. Wean and D/c insulin drip to SSI  * CKD stage III is stable.   * Acute anemia  hbof 6 yesterday. Patient received 1 unit of packed RBC transfusion, hemoglobin improved to 7.3, no further workup at this time  As per gastroenterology..  All the records are reviewed and case discussed with Care Management/Social Workerr. Management plans discussed with the patient, family and they are in agreement.  CODE STATUS: Full Code  TOTAL TIME TAKING CARE OF THIS PATIENT: 36 minutes.   POSSIBLE D/C IN 2-3 DAYS, DEPENDING ON CLINICAL CONDITION.   Epifanio Lesches M.D on 04/28/2017 at 2:06 PM  Between 7am to 6pm - Pager - [phone]  After 6pm go to [email or web address] - password [email or web address] [email or web address]  `[facility] [employer]  [phone]  CC: Primary care physician; Hermelinda Dellen, DO

## 2017-04-29 ENCOUNTER — Inpatient Hospital Stay: Payer: Medicare Other

## 2017-04-29 DIAGNOSIS — J384 Edema of larynx: Secondary | ICD-10-CM

## 2017-04-29 DIAGNOSIS — G4733 Obstructive sleep apnea (adult) (pediatric): Secondary | ICD-10-CM

## 2017-04-29 DIAGNOSIS — E662 Morbid (severe) obesity with alveolar hypoventilation: Secondary | ICD-10-CM

## 2017-04-29 DIAGNOSIS — I5033 Acute on chronic diastolic (congestive) heart failure: Secondary | ICD-10-CM

## 2017-04-29 DIAGNOSIS — J9621 Acute and chronic respiratory failure with hypoxia: Secondary | ICD-10-CM

## 2017-04-29 LAB — TYPE AND SCREEN
ABO/RH(D): B POS
Antibody Screen: NEGATIVE
UNIT DIVISION: 0
Unit division: 0
Unit division: 0

## 2017-04-29 LAB — BPAM RBC
BLOOD PRODUCT EXPIRATION DATE: 201810312359
Blood Product Expiration Date: 201810242359
Blood Product Expiration Date: 201810262359
ISSUE DATE / TIME: 201810131126
ISSUE DATE / TIME: 201810150041
UNIT TYPE AND RH: 1700
UNIT TYPE AND RH: 1700
UNIT TYPE AND RH: 7300

## 2017-04-29 LAB — CBC
HEMATOCRIT: 23.9 % — AB (ref 35.0–47.0)
Hemoglobin: 7.4 g/dL — ABNORMAL LOW (ref 12.0–16.0)
MCH: 23 pg — AB (ref 26.0–34.0)
MCHC: 31 g/dL — ABNORMAL LOW (ref 32.0–36.0)
MCV: 74.3 fL — AB (ref 80.0–100.0)
Platelets: 307 10*3/uL (ref 150–440)
RBC: 3.22 MIL/uL — AB (ref 3.80–5.20)
RDW: 21.2 % — ABNORMAL HIGH (ref 11.5–14.5)
WBC: 9.9 10*3/uL (ref 3.6–11.0)

## 2017-04-29 LAB — BASIC METABOLIC PANEL
Anion gap: 11 (ref 5–15)
BUN: 52 mg/dL — ABNORMAL HIGH (ref 6–20)
CHLORIDE: 102 mmol/L (ref 101–111)
CO2: 33 mmol/L — AB (ref 22–32)
Calcium: 8.7 mg/dL — ABNORMAL LOW (ref 8.9–10.3)
Creatinine, Ser: 1.5 mg/dL — ABNORMAL HIGH (ref 0.44–1.00)
GFR calc non Af Amer: 34 mL/min — ABNORMAL LOW (ref >60)
GFR, EST AFRICAN AMERICAN: 39 mL/min — AB (ref >60)
Glucose, Bld: 168 mg/dL — ABNORMAL HIGH (ref 65–99)
POTASSIUM: 3.3 mmol/L — AB (ref 3.5–5.1)
SODIUM: 146 mmol/L — AB (ref 135–145)

## 2017-04-29 LAB — GLUCOSE, CAPILLARY
GLUCOSE-CAPILLARY: 141 mg/dL — AB (ref 65–99)
GLUCOSE-CAPILLARY: 172 mg/dL — AB (ref 65–99)
GLUCOSE-CAPILLARY: 234 mg/dL — AB (ref 65–99)
GLUCOSE-CAPILLARY: 258 mg/dL — AB (ref 65–99)
GLUCOSE-CAPILLARY: 89 mg/dL (ref 65–99)
Glucose-Capillary: 140 mg/dL — ABNORMAL HIGH (ref 65–99)
Glucose-Capillary: 213 mg/dL — ABNORMAL HIGH (ref 65–99)

## 2017-04-29 MED ORDER — POTASSIUM CHLORIDE 2 MEQ/ML IV SOLN
INTRAVENOUS | Status: DC
  Administered 2017-04-29: 10:00:00 via INTRAVENOUS
  Filled 2017-04-29 (×3): qty 1000

## 2017-04-29 MED ORDER — METHYLPREDNISOLONE SODIUM SUCC 125 MG IJ SOLR
80.0000 mg | Freq: Once | INTRAMUSCULAR | Status: AC
  Administered 2017-04-29: 80 mg via INTRAVENOUS

## 2017-04-29 MED ORDER — POTASSIUM CHLORIDE 10 MEQ/50ML IV SOLN
10.0000 meq | INTRAVENOUS | Status: AC
  Administered 2017-04-29 (×4): 10 meq via INTRAVENOUS
  Filled 2017-04-29 (×4): qty 50

## 2017-04-29 MED ORDER — FUROSEMIDE 10 MG/ML IJ SOLN
INTRAMUSCULAR | Status: AC
  Filled 2017-04-29: qty 8

## 2017-04-29 MED ORDER — METHYLPREDNISOLONE SODIUM SUCC 40 MG IJ SOLR
40.0000 mg | Freq: Two times a day (BID) | INTRAMUSCULAR | Status: DC
  Administered 2017-04-29 – 2017-05-01 (×4): 40 mg via INTRAVENOUS
  Filled 2017-04-29 (×3): qty 1

## 2017-04-29 MED ORDER — METHYLPREDNISOLONE SODIUM SUCC 125 MG IJ SOLR
INTRAMUSCULAR | Status: AC
  Filled 2017-04-29: qty 2

## 2017-04-29 MED ORDER — CLONIDINE HCL 0.3 MG/24HR TD PTWK
0.3000 mg | MEDICATED_PATCH | TRANSDERMAL | Status: DC
  Administered 2017-04-29: 0.3 mg via TRANSDERMAL
  Filled 2017-04-29: qty 1

## 2017-04-29 MED ORDER — FUROSEMIDE 10 MG/ML IJ SOLN
80.0000 mg | Freq: Once | INTRAMUSCULAR | Status: AC
  Administered 2017-04-29: 80 mg via INTRAVENOUS

## 2017-04-29 MED ORDER — CHLORHEXIDINE GLUCONATE 0.12 % MT SOLN
15.0000 mL | Freq: Two times a day (BID) | OROMUCOSAL | Status: DC
  Administered 2017-04-30 – 2017-05-04 (×6): 15 mL via OROMUCOSAL
  Filled 2017-04-29 (×7): qty 15

## 2017-04-29 MED ORDER — FUROSEMIDE 10 MG/ML IJ SOLN
40.0000 mg | Freq: Once | INTRAMUSCULAR | Status: AC
  Administered 2017-04-29: 40 mg via INTRAVENOUS
  Filled 2017-04-29: qty 4

## 2017-04-29 MED ORDER — ORAL CARE MOUTH RINSE
15.0000 mL | Freq: Two times a day (BID) | OROMUCOSAL | Status: DC
  Administered 2017-04-29 – 2017-05-01 (×4): 15 mL via OROMUCOSAL

## 2017-04-29 MED ORDER — METOPROLOL TARTRATE 5 MG/5ML IV SOLN: 2.5000 mg | INTRAVENOUS | Status: DC | PRN

## 2017-04-29 NOTE — Progress Notes (Signed)
Patient's BP elevated during the day, hydralazine given with little result. Hinton Dyer, NP notified and clonidine patch increased. BP has improved. Patient currently still on bipap. Wilnette Kales

## 2017-04-29 NOTE — Progress Notes (Signed)
Per Hinton Dyer, NP hold all PO meds at this time. Patient unable to tolerate nasal cannula at this time. Patient remains on bipap, will continue to assess. Wilnette Kales

## 2017-04-29 NOTE — Progress Notes (Signed)
PULMONARY / CRITICAL CARE MEDICINE   Name: Veronica Anderson MRN: [id number] DOB: [birth year]    ADMISSION DATE:  04/19/2017  PT PROFILE: 50 F adm 10/07 to hospitalist service with diagnosis of acute on chronic diastolic congestive heart failure, diabetes, stage III CKD. Admitted to SDU requiring BiPAP. Seen in consultation by PCCM at that time. Underwent intubation 04/23/17 for worsening respiratory failure  MAJOR EVENTS/TEST RESULTS: 10/07 Admission 10/07 PCCM consultation 10/12 nephrology consultation 10/12 CT Chest: Patchy left lower lobe opacity, suspicious for pneumonia. Additional mild patchy opacity in the posterior right upper lobe and right middle lobe, atelectasis versus pneumonia. Moderate bilateral pleural effusions 10/13 transfused 2 units PRBCs for hemoglobin 6.8, 6.6 10/15 gastroenterology consultation - no endoscopy planned 10/17 Passed SBT and extubated. Post-extubation stridor > BiPAP, methylprednisolone. Edema pattern on CXR > furosemide  INDWELLING DEVICES:: ETT 10/11>>10/17 R IJ CVL 10/11 >>   MICRO DATA: MRSA PCR 10/07>>POS  ANTIMICROBIALS:  Vanc 10/14 >> 10/15 Pip-tazo 10/14 >> 10/15  SUBJECTIVE:  Pt currently on BiPAP. Cognition intact.  VITAL SIGNS: BP (!) 202/103   Pulse 94   Temp 98.5 F (36.9 C) (Axillary)   Resp (!) 21   Ht 5\' 5"  (1.651 m)   Wt 136 kg (299 lb 13.2 oz)   SpO2 (!) 89%   BMI 49.89 kg/m   HEMODYNAMICS:    VENTILATOR SETTINGS: Vent Mode: PSV FiO2 (%):  [40 %] 40 % PEEP:  [5 cmH20] 5 cmH20 Pressure Support:  [5 cmH20] 5 cmH20  INTAKE / OUTPUT: I/O last 3 completed shifts: In: 3323.3 [I.V.:1850; NG/GT:1323.3; IV Piggyback:150] Out: 2255 [Urine:2255]  PHYSICAL EXAMINATION: General: obese AA female, in respiratory distress on BIpap  Neuro: alert, follows commands  HEENT: NCAT, sclerae white Cardiovascular: sinus tach, s1s2, no M/R/G  Lungs: stridor, wheezes throughout, labored, tachypneic Abdomen: obese, soft, +BS X  4 Extremities: 1+ bilateral lower extremity edema  LABS:  BMET  Recent Labs Lab 04/27/17 0538 04/28/17 0340 04/29/17 0302  NA 147* 145 146*  K 3.5 3.2* 3.3*  CL 102 102 102  CO2 36* 34* 33*  BUN 63* 57* 52*  CREATININE 1.81* 1.53* 1.50*  GLUCOSE 141* 207* 168*    Electrolytes  Recent Labs Lab 04/23/17 1211 04/24/17 0608 04/25/17 0849  04/26/17 1058 04/27/17 0538 04/28/17 0340 04/29/17 0302  CALCIUM 8.5* 8.3* 7.7*  < > 7.8* 7.8* 7.8* 8.7*  MG 2.2 2.0  --   --  2.1  --   --   --   PHOS  --  1.6* 3.8  --  3.0  --   --   --   < > = values in this interval not displayed.  CBC  Recent Labs Lab 04/27/17 0538 04/28/17 0340 04/29/17 0302  WBC 13.8* 11.1* 9.9  HGB 7.6* 7.2* 7.4*  HCT 24.0* 23.5* 23.9*  PLT 232 258 307    Coag's No results for input(s): APTT, INR in the last 168 hours.  Sepsis Markers No results for input(s): LATICACIDVEN, PROCALCITON, O2SATVEN in the last 168 hours.  ABG  Recent Labs Lab 04/23/17 1220 04/23/17 1640 04/23/17 2045  PHART 7.35 7.33* 7.54*  PCO2ART 100* 105* 60*  PO2ART 57* 79* 109*    Liver Enzymes  Recent Labs Lab 04/28/17 0340  AST 23  ALT 15  ALKPHOS 67  BILITOT 0.7  ALBUMIN 2.0*    Cardiac Enzymes No results for input(s): TROPONINI, PROBNP in the last 168 hours.  Glucose  Recent Labs Lab 04/28/17 1633 04/28/17 1942 04/29/17  0002 04/29/17 0408 04/29/17 0804 04/29/17 1125  GLUCAP 208* 184* 213* 140* 24 141*    CXR: CM, edema pattern   ASSESSMENT / PLAN:  PULMONARY A: Acute respiratory failure-stridor post extubation 04/29/17 Pulmonary edema Stridor  P:   Supplemental O2 or Bipap to maintain O2 sats >92% Continue scheduled bronchodilator therapy  IV lasix IV solumedrol Prn CXR  HIGH RISK FOR REINTUBATION  CARDIOVASCULAR A:  HFpEF Hypertension P:  Continue clonidine patch and prn iv hydralazine maintain SBP < 170 mmHg Hemodynamic monitoring per ICU protocol  RENAL A:    CKD AKI - creatinine improving Hypernatremia Hypokalemia P:   Monitor BMET intermittently Monitor I/Os Correct electrolytes as indicated  D5W with 40 meq KCL @50  ml/hr   GASTROINTESTINAL A:   Obesity P:   SUP: IV PPI Keep NPO for now until off Bipap   HEMATOLOGIC A:   Acute blood loss anemia - no overt bleeding presently Microcytosis P:  DVT px: SCDs Monitor CBC intermittently Transfuse per usual guidelines   INFECTIOUS A:   Doubt pneumonia No definite infection identified P:   Monitor temp, WBC count Micro and abx as above   ENDOCRINE A:   DM 2 With severe hyperglycemia P:    Continue SSI and lantus   NEUROLOGIC A:   No acute issues  P:   Avoid sedating medications    FAMILY: Pts son updated regarding plan of care and all questions answered 04/29/17  Marda Stalker, [place] Pager [phone] (please enter 7 digits) PCCM Consult Pager [phone] (please enter 7 digits)  PCCM ATTENDING ATTESTATION:  I have evaluated patient with the APP [place], reviewed database in its entirety and discussed care plan in detail. In addition, this patient was discussed on multidisciplinary rounds.   Important exam findings: Cognition intact Stridor Coarse breath sounds Mild LE edema  Major problems addressed by PCCM team: Acute on chronic respiratory failure OSA/OHS Pulmonary edema Diastolic heart failure Hypertension AKI/CKD Acute blood loss anemia with microcytosis - not presently actively bleeding Type 2 diabetes Post-extubation stridor. Likely laryngeal edema   PLAN/REC: Plan is as outlined above. The patient has been evaluated on multiple occasions by me and by nurse practitioner throughout the day. She is presently adequately supported on BiPAP She is at high risk for reintubation. She will be monitored closely in the ICU.    CCM time: 45 mins The above time includes time spent in consultation with patient and/or family  members and reviewing care plan on multidisciplinary rounds  Merton Border, MD PCCM service Mobile [phone] Pager [phone] 04/29/2017 4:09 PM

## 2017-04-29 NOTE — Progress Notes (Addendum)
[place] at [place] NAME: Veronica Anderson    MR#:  [id number]  DATE OF BIRTH:  Sep 25, 1945  SUBJECTIVE: No fever, extubated this morning ,nowon BiPAP at this time. Have a been using the accessory muscles of respiration but patient denies this shortness of breath or difficulty with respirations. bp elevated to 250/90.   CHIEF COMPLAINT:   Chief Complaint  Patient presents with  . Respiratory Distress   -   REVIEW OF SYSTEMS:  Review of Systems  Constitutional: Positive for malaise/fatigue. Negative for chills and fever.  HENT: Negative for ear discharge, hearing loss and nosebleeds.   Eyes: Negative for blurred vision, double vision and photophobia.  Respiratory: Positive for shortness of breath. Negative for cough, hemoptysis and wheezing.   Cardiovascular: Negative for chest pain, palpitations, orthopnea and leg swelling.  Gastrointestinal: Negative for abdominal pain, constipation, diarrhea, nausea and vomiting.  Genitourinary: Negative for dysuria and urgency.  Musculoskeletal: Negative for myalgias and neck pain.  Skin: Negative for rash.  Neurological: Negative for dizziness, sensory change, speech change, focal weakness, seizures, weakness and headaches.  Psychiatric/Behavioral: Negative for depression and memory loss. The patient does not have insomnia.   : Alert and awake  DRUG ALLERGIES:   Allergies  Allergen Reactions  . Ace Inhibitors Cough    VITALS:  Blood pressure (!) 215/85, pulse 94, temperature 98.5 F (36.9 C), temperature source Axillary, resp. rate (!) 21, height 5\' 5"  (1.651 m), weight 136 kg (299 lb 13.2 oz), SpO2 (!) 89 %.  PHYSICAL EXAMINATION:  Physical Exam  GENERAL:  71 y.o.-year-old obese patient lying in the bed , Currently intubated, Saturation is being weaned off.sedationbeing turned off. EYES: Pupils equal, round, reactive to light.. No scleral icterus. HEENT: Head atraumatic, normocephalic. Orally  intubated. NECK:  Supple, no jugular venous distention. No thyroid enlargement, no tenderness.  LUNGS: Diminished breath sounds bilaterally. CARDIOVASCULAR: S1, S2 normal. No murmurs, rubs, or gallops.  ABDOMEN: Soft, nontender, nondistended. Bowel sounds present. No organomegaly or mass.  EXTREMITIES: No pedal edema, cyanosis, or clubbing.  NEUROLOGIC:Unable to do neurological exam because of her intubation,  PSYCHIATRIC: The patient is alert and oriented x 3.  SKIN: No obvious rash, lesion, or ulcer.    LABORATORY PANEL:   CBC  Recent Labs Lab 04/29/17 0302  WBC 9.9  HGB 7.4*  HCT 23.9*  PLT 307   ------------------------------------------------------------------------------------------------------------------  Chemistries   Recent Labs Lab 04/26/17 1058  04/28/17 0340 04/29/17 0302  NA 146*  < > 145 146*  K 3.8  < > 3.2* 3.3*  CL 98*  < > 102 102  CO2 37*  < > 34* 33*  GLUCOSE 182*  < > 207* 168*  BUN 63*  < > 57* 52*  CREATININE 2.08*  < > 1.53* 1.50*  CALCIUM 7.8*  < > 7.8* 8.7*  MG 2.1  --   --   --   AST  --   --  23  --   ALT  --   --  15  --   ALKPHOS  --   --  67  --   BILITOT  --   --  0.7  --   < > = values in this interval not displayed. ------------------------------------------------------------------------------------------------------------------  Cardiac Enzymes No results for input(s): TROPONINI in the last 168 hours. ------------------------------------------------------------------------------------------------------------------  RADIOLOGY:  Dg Chest Port 1 View  Result Date: 04/29/2017 CLINICAL DATA:  Respiratory failure. EXAM: PORTABLE CHEST 1 VIEW COMPARISON:  Radiograph of April 28, 2017. FINDINGS: Stable cardiomediastinal silhouette. Endotracheal and nasogastric tubes are unchanged in position. Right internal jugular catheter is unchanged in position. Stable mild bilateral pulmonary edema is noted, with probable mild left pleural  effusion. Bony thorax is unremarkable. IMPRESSION: Stable support apparatus. Stable bilateral pulmonary edema is noted with associated mild left pleural effusion. Electronically Signed   By: Marijo Conception, M.D.   On: 04/29/2017 07:45   Dg Chest Port 1 View  Result Date: 04/28/2017 CLINICAL DATA:  71 year old female respiratory failure. Subsequent encounter. EXAM: PORTABLE CHEST 1 VIEW COMPARISON:  04/26/2017 chest x-ray. FINDINGS: Endotracheal tube tip 4.5 cm above the carina. Right central line tip distal superior vena cava. Nasogastric tube courses below the diaphragm. Tip is not included on the present exam. Rotation to the right with distortion of mediastinal and cardiac silhouette. Cardiomegaly. Diffuse airspace disease minimally changed from prior examination. Bilateral pleural effusions. Basilar atelectasis or infiltrates. IMPRESSION: Similar appearance of diffuse airspace disease which may represent pulmonary edema with bilateral pleural effusions and basal atelectasis. Infectious process, particularly in lower lobes not excluded in the proper clinical setting. Cardiomegaly. Electronically Signed   By: Genia Del M.D.   On: 04/28/2017 07:02    EKG:   Orders placed or performed during the hospital encounter of 04/19/17  . ED EKG  . ED EKG  . EKG 12-Lead  . EKG 12-Lead  . EKG  . EKG 12-Lead  . EKG 12-Lead    ASSESSMENT AND PLAN:   71 year old female, morbidly obese with past medical history significant for chronic diastolic heart failure, hypertension, diabetes and CK D stage III presents to hospital secondary to worsening shortness of breath.  * Acute on chronic hypoxic respiratory failure' acute on chronic diastolic heart failure, pneumonia Status post extubation today morning, continue BiPAP support, close monitoring for respiratory decompensation. Continue IV steroids, bronchodilators.: -  -#2 acute on chronic chronic kidney disease stage III with baseline GFR 35: Not a  candidate for hemodialysis at this time.  nephrology is following. Monitor urine output, Stopped  IV diuretics . Renal function is improving.  Acute renal failure likely due to hypotension: Renal ultrasound is negative for any hydronephrosis or obstruction., Renal function improved, creatinine down from 1.81-1.50.  Hypokalemia; Replace the potassium. Hyponatremia patient is on D5 water at 50 mL an hour. Daily labs are ordered. * * Insulin-dependent diabetes mellitus. Wean and D/c insulin drip to SSI  * CKD stage III is stable.   * Acute anemiaon,, anemia of chronic disease hemoglobin was 6 at that time she received blood transfusion, hemoglobin is stable around 7.4 now. Needs GI workup including colonoscopy when stable can be done as an outpatient. GI signed off.   All the records are reviewed and case discussed with Care Management/Social Workerr. Management plans discussed with the patient, family and they are in agreement.  CODE STATUS: Full Code  TOTAL TIME TAKING CARE OF THIS PATIENT: 36 minutes.   POSSIBLE D/C IN 2-3 DAYS, DEPENDING ON CLINICAL CONDITION.   Epifanio Lesches M.D on 04/29/2017 at 1:53 PM  Between 7am to 6pm - Pager - [phone]  After 6pm go to [email or web address] - password [email or web address] [place]  `[facility]  Office  [phone]  CC: Primary care physician; Hermelinda Dellen, DO

## 2017-04-29 NOTE — Progress Notes (Signed)
Inpatient Diabetes Program Recommendations  AACE/ADA: New Consensus Statement on Inpatient Glycemic Control (2015)  Target Ranges:  Prepandial:   less than 140 mg/dL      Peak postprandial:   less than 180 mg/dL (1-2 hours)      Critically ill patients:  140 - 180 mg/dL   Results for AYRABELLA, LABOMBARD (MRN [id number]) as of 04/29/2017 11:45  Ref. Range 04/29/2017 00:02 04/29/2017 04:08 04/29/2017 08:04 04/29/2017 11:25  Glucose-Capillary Latest Ref Range: 65 - 99 mg/dL 213 (H) 140 (H) 89 141 (H)    Home DM Meds: Lantus 50 units daily                             Humalog 5 units TID  Current Insulin Orders: Lantus 50 units daily      Novolog Resistant Correction Scale/ SSI (0-20 units) Q4 hours      Novolog 4 units Q4 hours (Tube feed coverage)       MD- Note patient extubated this AM and tube feeds stopped.  Please stop Novolog 4 units Q4 hours since tube feeds stopped.     --Will follow patient during hospitalization--  Wyn Quaker RN, MSN, CDE Diabetes Coordinator Inpatient Glycemic Control Team Team Pager: [phone] (8a-5p)

## 2017-04-29 NOTE — Progress Notes (Signed)
Patient extubated approximately 0600 this morning. Transitioned to nasal cannula 5L. Strong productive cough. Voice weak, following commands, A/O. Regular respiratory rate and no work of breathing oxygen saturations 95-100% on monitor. Two sons at bedside, whom are supportive and helpful. Patient is calm and bringing secretions up with coughing, patient using suction by herself as well. Will continue to monitor and assess patient.  Also notified [place], NP of morning potassium level of 3.3.   Will be giving day shift report to next oncoming nurse at 0700.

## 2017-04-29 NOTE — Progress Notes (Signed)
[facility]  ROUNDING NOTE   Subjective:   Son at bedside.   Extubated this morning.   Creatinine 1.5 (1.53)  Objective:  Vital signs in last 24 hours:  Temp:  [98.2 F (36.8 C)-99.1 F (37.3 C)] 98.2 F (36.8 C) (10/17 0800) Pulse Rate:  [64-92] 91 (10/17 0800) Resp:  [15-27] 18 (10/17 0800) BP: (142-192)/(53-122) 179/77 (10/17 0800) SpO2:  [93 %-100 %] 93 % (10/17 0800) FiO2 (%):  [40 %] 40 % (10/17 0423) Weight:  [136 kg (299 lb 13.2 oz)] 136 kg (299 lb 13.2 oz) (10/17 0222)  Weight change: 5.9 kg (13 lb 0.1 oz) Filed Weights   04/26/17 0431 04/28/17 0339 04/29/17 0222  Weight: (!) 140.9 kg (310 lb 10.1 oz) 130.1 kg (286 lb 13.1 oz) 136 kg (299 lb 13.2 oz)    Intake/Output: I/O last 3 completed shifts: In: 3323.3 [I.V.:1850; NG/GT:1323.3; IV Piggyback:150] Out: 2255 [Urine:2255]   Intake/Output this shift:  Total I/O In: 206.7 [NG/GT:156.7; IV Piggyback:50] Out: 470 [Urine:470]  Physical Exam: General: critically ill appearing  Head: BIPAP  Eyes: Anicteric  Neck: Right IJ central line  Lungs:  Clear, diminished  Heart: regular  Abdomen:  Soft, nontender   Extremities: No peripheral and dependent edema.  Neurologic: Intubated, and sedated. Following commands off sedation  Skin: No lesions       Basic Metabolic Panel:  Recent Labs Lab 04/23/17 1211 04/24/17 0608 04/25/17 0849 04/26/17 0542 04/26/17 1058 04/27/17 0538 04/28/17 0340 04/29/17 0302  NA 149* 149* 144 144 146* 147* 145 146*  K 4.5 3.1* 4.1 3.4* 3.8 3.5 3.2* 3.3*  CL 99* 99* 97* 98* 98* 102 102 102  CO2 36* 38* 36* 36* 37* 36* 34* 33*  GLUCOSE 134* 74 226* 180* 182* 141* 207* 168*  BUN 29* 30* 44* 59* 63* 63* 57* 52*  CREATININE 1.68* 1.65* 2.23* 2.16* 2.08* 1.81* 1.53* 1.50*  CALCIUM 8.5* 8.3* 7.7* 7.6* 7.8* 7.8* 7.8* 8.7*  MG 2.2 2.0  --   --  2.1  --   --   --   PHOS  --  1.6* 3.8  --  3.0  --   --   --     Liver Function Tests:  Recent Labs Lab  04/28/17 0340  AST 23  ALT 15  ALKPHOS 67  BILITOT 0.7  PROT 6.6  ALBUMIN 2.0*   No results for input(s): LIPASE, AMYLASE in the last 168 hours. No results for input(s): AMMONIA in the last 168 hours.  CBC:  Recent Labs Lab 04/25/17 0515  04/26/17 1058 04/26/17 2244 04/27/17 0538 04/28/17 0340 04/29/17 0302  WBC 9.1  --  11.6* 11.9* 13.8* 11.1* 9.9  NEUTROABS 6.9*  --  9.1*  --   --   --   --   HGB 6.0*  < > 6.8* 6.6* 7.6* 7.2* 7.4*  HCT 19.8*  < > 22.8* 22.4* 24.0* 23.5* 23.9*  MCV 72.0*  --  73.3* 73.9* 73.8* 74.2* 74.3*  PLT 206  --  221 221 232 258 307  < > = values in this interval not displayed.  Cardiac Enzymes: No results for input(s): CKTOTAL, CKMB, CKMBINDEX, TROPONINI in the last 168 hours.  BNP: Invalid input(s): POCBNP  CBG:  Recent Labs Lab 04/28/17 1633 04/28/17 1942 04/29/17 0002 04/29/17 0408 04/29/17 0804  GLUCAP 208* 184* 213* 140* 22    Microbiology: Results for orders placed or performed during the hospital encounter of 04/19/17  MRSA PCR Screening  Status: Abnormal   Collection Time: 04/19/17 10:07 AM  Result Value Ref Range Status   MRSA by PCR POSITIVE (A) NEGATIVE Final    Comment:        The GeneXpert MRSA Assay (FDA approved for NASAL specimens only), is one component of a comprehensive MRSA colonization surveillance program. It is not intended to diagnose MRSA infection nor to guide or monitor treatment for MRSA infections. RESULT CALLED TO, READ BACK BY AND VERIFIED WITH: SANDRA BOREA ON 04/19/17 AT 1228 [facility]     Coagulation Studies: No results for input(s): LABPROT, INR in the last 72 hours.  Urinalysis:  Recent Labs  04/27/17 0952  COLORURINE YELLOW*  LABSPEC 1.017  PHURINE 7.0  GLUCOSEU NEGATIVE  HGBUR MODERATE*  BILIRUBINUR NEGATIVE  KETONESUR NEGATIVE  PROTEINUR 100*  NITRITE NEGATIVE  LEUKOCYTESUR LARGE*      Imaging: Dg Chest Port 1 View  Result Date: 04/29/2017 CLINICAL DATA:   Respiratory failure. EXAM: PORTABLE CHEST 1 VIEW COMPARISON:  Radiograph of April 28, 2017. FINDINGS: Stable cardiomediastinal silhouette. Endotracheal and nasogastric tubes are unchanged in position. Right internal jugular catheter is unchanged in position. Stable mild bilateral pulmonary edema is noted, with probable mild left pleural effusion. Bony thorax is unremarkable. IMPRESSION: Stable support apparatus. Stable bilateral pulmonary edema is noted with associated mild left pleural effusion. Electronically Signed   By: Marijo Conception, M.D.   On: 04/29/2017 07:45   Dg Chest Port 1 View  Result Date: 04/28/2017 CLINICAL DATA:  71 year old female respiratory failure. Subsequent encounter. EXAM: PORTABLE CHEST 1 VIEW COMPARISON:  04/26/2017 chest x-ray. FINDINGS: Endotracheal tube tip 4.5 cm above the carina. Right central line tip distal superior vena cava. Nasogastric tube courses below the diaphragm. Tip is not included on the present exam. Rotation to the right with distortion of mediastinal and cardiac silhouette. Cardiomegaly. Diffuse airspace disease minimally changed from prior examination. Bilateral pleural effusions. Basilar atelectasis or infiltrates. IMPRESSION: Similar appearance of diffuse airspace disease which may represent pulmonary edema with bilateral pleural effusions and basal atelectasis. Infectious process, particularly in lower lobes not excluded in the proper clinical setting. Cardiomegaly. Electronically Signed   By: Genia Del M.D.   On: 04/28/2017 07:02     Medications:   . dextrose 5 % with kcl 50 mL/hr at 04/29/17 0936  . potassium chloride 10 mEq (04/29/17 0936)   . budesonide (PULMICORT) nebulizer solution  0.25 mg Nebulization Q6H  . chlorhexidine  15 mL Mouth Rinse BID  . cloNIDine  0.1 mg Transdermal Weekly  . docusate  100 mg Per Tube BID  . feeding supplement (PRO-STAT SUGAR FREE 64)  60 mL Per Tube BID  . feeding supplement (VITAL HIGH PROTEIN)  1,000  mL Per Tube Q24H  . free water  200 mL Per Tube Q8H  . gabapentin  300 mg Oral TID  . insulin aspart  0-20 Units Subcutaneous Q4H  . insulin aspart  4 Units Subcutaneous Q4H  . insulin glargine  50 Units Subcutaneous Daily  . ipratropium-albuterol  3 mL Nebulization Q6H  . mouth rinse  15 mL Mouth Rinse q12n4p  . multivitamin  15 mL Oral Daily  . pantoprazole (PROTONIX) IV  40 mg Intravenous Q24H  . sennosides  5 mL Per Tube BID  . sodium chloride flush  10-40 mL Intracatheter Q12H  . sodium chloride flush  3 mL Intravenous Q12H   acetaminophen **OR** acetaminophen, fentaNYL (SUBLIMAZE) injection, hydrALAZINE, midazolam, ondansetron (ZOFRAN) IV, polyethylene glycol, sodium chloride flush  Assessment/ Plan:  71 y.o.black female with anemia, congestive heart failure, chronic kidney disease stage III, diabetes mellitus type 2, peripheral edema, GERD, hypertension, hyperlipidemia, peripheral neuropathy, obesity, history of CVA, obstructive sleep apnea  1. Acute renal failure on chronic kidney disease stage III with proteinuria:  Baseline creatinine of 1.65, GFR of 35 on 04/24/17.   Chronic kidney disease secondary to diabetes, hypertension.  Acute renal failure secondary to overdiuresis. Restarted on furosemide today. Monitor volume status - Nonoliguric urine output.     2. Hypernatremia: from free water deficit from overdiuresis.  Started on D5W  3. Hypokalemia: due to overdiuresis and now post ATN diuresis.  - IV potassium replacement   LOS: [place], [place] 10/17/201810:41 AM

## 2017-04-30 ENCOUNTER — Inpatient Hospital Stay: Payer: Medicare Other

## 2017-04-30 LAB — CBC
HCT: 28.9 % — ABNORMAL LOW (ref 35.0–47.0)
Hemoglobin: 8.9 g/dL — ABNORMAL LOW (ref 12.0–16.0)
MCH: 22.7 pg — ABNORMAL LOW (ref 26.0–34.0)
MCHC: 30.6 g/dL — ABNORMAL LOW (ref 32.0–36.0)
MCV: 74.3 fL — ABNORMAL LOW (ref 80.0–100.0)
PLATELETS: 383 10*3/uL (ref 150–440)
RBC: 3.9 MIL/uL (ref 3.80–5.20)
RDW: 21.4 % — ABNORMAL HIGH (ref 11.5–14.5)
WBC: 11.3 10*3/uL — AB (ref 3.6–11.0)

## 2017-04-30 LAB — BASIC METABOLIC PANEL
ANION GAP: 11 (ref 5–15)
BUN: 49 mg/dL — ABNORMAL HIGH (ref 6–20)
CALCIUM: 9 mg/dL (ref 8.9–10.3)
CHLORIDE: 100 mmol/L — AB (ref 101–111)
CO2: 33 mmol/L — ABNORMAL HIGH (ref 22–32)
CREATININE: 1.41 mg/dL — AB (ref 0.44–1.00)
GFR calc non Af Amer: 37 mL/min — ABNORMAL LOW (ref >60)
GFR, EST AFRICAN AMERICAN: 42 mL/min — AB (ref >60)
Glucose, Bld: 152 mg/dL — ABNORMAL HIGH (ref 65–99)
Potassium: 4.1 mmol/L (ref 3.5–5.1)
SODIUM: 144 mmol/L (ref 135–145)

## 2017-04-30 LAB — GLUCOSE, CAPILLARY
GLUCOSE-CAPILLARY: 141 mg/dL — AB (ref 65–99)
GLUCOSE-CAPILLARY: 167 mg/dL — AB (ref 65–99)
GLUCOSE-CAPILLARY: 190 mg/dL — AB (ref 65–99)
Glucose-Capillary: 158 mg/dL — ABNORMAL HIGH (ref 65–99)
Glucose-Capillary: 159 mg/dL — ABNORMAL HIGH (ref 65–99)
Glucose-Capillary: 188 mg/dL — ABNORMAL HIGH (ref 65–99)

## 2017-04-30 MED ORDER — FERROUS SULFATE 325 (65 FE) MG PO TABS
325.0000 mg | ORAL_TABLET | Freq: Two times a day (BID) | ORAL | Status: DC
  Administered 2017-04-30 – 2017-05-05 (×10): 325 mg via ORAL
  Filled 2017-04-30 (×10): qty 1

## 2017-04-30 MED ORDER — PREMIER PROTEIN SHAKE
11.0000 [oz_av] | Freq: Three times a day (TID) | ORAL | Status: DC
  Administered 2017-05-02 – 2017-05-04 (×7): 11 [oz_av] via ORAL

## 2017-04-30 MED ORDER — HYDRALAZINE HCL 50 MG PO TABS
25.0000 mg | ORAL_TABLET | Freq: Three times a day (TID) | ORAL | Status: DC
  Administered 2017-04-30 (×2): 25 mg via ORAL
  Filled 2017-04-30 (×2): qty 1

## 2017-04-30 MED ORDER — METHYLPREDNISOLONE SODIUM SUCC 40 MG IJ SOLR
20.0000 mg | INTRAMUSCULAR | Status: AC
  Administered 2017-04-30: 20 mg via INTRAVENOUS
  Filled 2017-04-30: qty 1

## 2017-04-30 MED ORDER — FUROSEMIDE 10 MG/ML IJ SOLN
40.0000 mg | Freq: Once | INTRAMUSCULAR | Status: AC
  Administered 2017-04-30: 40 mg via INTRAVENOUS
  Filled 2017-04-30: qty 4

## 2017-04-30 MED ORDER — AMLODIPINE BESYLATE 10 MG PO TABS
10.0000 mg | ORAL_TABLET | Freq: Every day | ORAL | Status: DC
  Administered 2017-04-30 – 2017-05-05 (×6): 10 mg via ORAL
  Filled 2017-04-30 (×6): qty 1

## 2017-04-30 MED ORDER — ADULT MULTIVITAMIN W/MINERALS CH
1.0000 | ORAL_TABLET | Freq: Every day | ORAL | Status: DC
  Administered 2017-05-01 – 2017-05-05 (×5): 1 via ORAL
  Filled 2017-04-30 (×5): qty 1

## 2017-04-30 NOTE — Progress Notes (Signed)
Spoke with Hinton Dyer NP regaarding patients BP being elevated. Hydralizine given has already been given and BP remains elevated. PA will look at home medications and add orders.

## 2017-04-30 NOTE — Evaluation (Signed)
Physical Therapy Evaluation Patient Details Name: Veronica Anderson MRN: [id number] DOB: 1945-12-13 Today's Date: 04/30/2017   History of Present Illness  Pt is a 71 yo F admitted 04/19/17 to hospitalist service with diagnosis of acute on chronic diastolic congestive heart failure, diabetes, stage III CKD. Admitted to SDU requiring BiPAP. Seen in consultation by PCCM at that time. Underwent intubation 04/23/17 for worsening respiratory failure and extubated 04/29/17.  Assessment includes: acute respiratory failure with stridor, pulmonary edema, HTN, CKD III, AKI, acute blood loss anemia, and DM II with severe hyperglycemia.      Clinical Impression  Pt presents with significant deficits in strength, transfers, mobility, gait, balance, and activity tolerance.  Pt reports significant fatigue/exhaustion and declined attempts at any mobility this date.  Pt extubated 04/29/17 with diagnosis of stridor post extubation with wheezing sound clearly audible.  Pt concerned with exertion exacerbating labored breathing but agreed to gentle supine therex.  Pt lethargic and required frequent verbal and tactile cues for participation and technique with SpO2 and HR WNL during session on 4LO2/min.  Pt will benefit from PT services in a SNF setting upon discharge to safely address above deficits for decreased caregiver assistance and eventual return to PLOF.       Follow Up Recommendations SNF    Equipment Recommendations  Rolling walker with 5" wheels (TBD at next venue of care but pt will likely require bariatric RW)    Recommendations for Other Services       Precautions / Restrictions Precautions Precautions: Fall Restrictions Weight Bearing Restrictions: No      Mobility  Bed Mobility               General bed mobility comments: Pt declined attempts at bed mobility secondary to significant fatigue  Transfers                 General transfer comment: Unable/unsafe to  attempt  Ambulation/Gait             General Gait Details: Unable/unsafe to attempt  Stairs            Wheelchair Mobility    Modified Rankin (Stroke Patients Only)       Balance                                             Pertinent Vitals/Pain Pain Assessment: No/denies pain    Home Living Family/patient expects to be discharged to:: Private residence Living Arrangements: Children Available Help at Discharge: Family;Available PRN/intermittently Type of Home: House Home Access: Stairs to enter Entrance Stairs-Rails: Right;Left;Can reach both Entrance Stairs-Number of Steps: 2 Home Layout: One level Home Equipment: Cane - single point;Bedside commode;Wheelchair - manual      Prior Function Level of Independence: Needs assistance   Gait / Transfers Assistance Needed: Performed with SPC for household distances  ADL's / Homemaking Assistance Needed: Performed by two sons  Comments: Pt only out of the home for MD appointments     Hand Dominance        Extremity/Trunk Assessment   Upper Extremity Assessment Upper Extremity Assessment: Generalized weakness    Lower Extremity Assessment Lower Extremity Assessment: Generalized weakness;RLE deficits/detail;LLE deficits/detail RLE Deficits / Details: Difficult to assess secondary to pt lethargic but hip and knee strength grossly <3/5  LLE Deficits / Details: Difficult to assess secondary to pt lethargic but hip and knee  strength grossly <3/5        Communication   Communication:  (Pt with difficulty speaking with history verified by family)  Cognition Arousal/Alertness: Lethargic Behavior During Therapy: WFL for tasks assessed/performed Overall Cognitive Status: Within Functional Limits for tasks assessed                                 General Comments: Pt generally keeping eyes closed during session, though able to particiapte with exercises and answer some  questions.      General Comments      Exercises Total Joint Exercises Ankle Circles/Pumps: Strengthening;Both;5 reps;10 reps Quad Sets: Strengthening;Both;5 reps Gluteal Sets: Strengthening;Both;5 reps Other Exercises Other Exercises: HEP education with pt and family to attempt BLE APs, GS, and QS x 5-10 reps every 1-2 hours as tolerated by pt.     Assessment/Plan    PT Assessment Patient needs continued PT services  PT Problem List Decreased strength;Decreased activity tolerance;Decreased mobility;Decreased knowledge of use of DME;Obesity       PT Treatment Interventions DME instruction;Gait training;Stair training;Therapeutic activities;Functional mobility training;Therapeutic exercise;Balance training;Neuromuscular re-education;Patient/family education    PT Goals (Current goals can be found in the Care Plan section)  Acute Rehab PT Goals Patient Stated Goal: To go home PT Goal Formulation: With patient/family Time For Goal Achievement: 05/13/17 Potential to Achieve Goals: Fair    Frequency Min 2X/week   Barriers to discharge Inaccessible home environment;Decreased caregiver support      Co-evaluation               AM-PAC PT "6 Clicks" Daily Activity  Outcome Measure Difficulty turning over in bed (including adjusting bedclothes, sheets and blankets)?: Unable Difficulty moving from lying on back to sitting on the side of the bed? : Unable Difficulty sitting down on and standing up from a chair with arms (e.g., wheelchair, bedside commode, etc,.)?: Unable Help needed moving to and from a bed to chair (including a wheelchair)?: Total Help needed walking in hospital room?: Total Help needed climbing 3-5 steps with a railing? : Total 6 Click Score: 6    End of Session Equipment Utilized During Treatment: Oxygen Activity Tolerance: Patient limited by fatigue;Patient limited by lethargy Patient left: with bed alarm set;with call bell/phone within reach;with  family/visitor present Nurse Communication: Mobility status PT Visit Diagnosis: Muscle weakness (generalized) (M62.81);Difficulty in walking, not elsewhere classified (R26.2)    Time: 1450-1510 PT Time Calculation (min) (ACUTE ONLY): 20 min   Charges:   PT Evaluation $PT Eval Low Complexity: 1 Low PT Treatments $Therapeutic Exercise: 8-22 mins   PT G Codes:   PT G-Codes **NOT FOR INPATIENT CLASS** Functional Assessment Tool Used: AM-PAC 6 Clicks Basic Mobility Functional Limitation: Mobility: Walking and moving around Mobility: Walking and Moving Around Current Status (T2458): 100 percent impaired, limited or restricted Mobility: Walking and Moving Around Goal Status (K9983): At least 40 percent but less than 60 percent impaired, limited or restricted    D. Royetta Asal PT, DPT 04/30/17, 3:56 PM

## 2017-04-30 NOTE — Progress Notes (Signed)
Nutrition Follow-up  DOCUMENTATION CODES:   Morbid obesity  INTERVENTION:  Provide Premier Protein po TID, each supplement provides 160 kcal and 30 grams of protein.  Will change MVI to tablet.  Encouraged adequate intake of protein at meals to prevent loss of lean body mass.  NUTRITION DIAGNOSIS:   Inadequate oral intake related to poor appetite as evidenced by per patient/family report.  Ongoing.  GOAL:   Patient will meet greater than or equal to 90% of their needs  Not met.  MONITOR:   PO intake, Supplement acceptance, Labs, Weight trends, Skin, I & O's  REASON FOR ASSESSMENT:   Ventilator    ASSESSMENT:   71 yo morbidly obese female with PMH of CHF, HTN, Diabetes, CKD IV, presents with respiratory distress, worsening shortness of breath. Was on BiPAP but unable to maintain respiratory status, was intubated yesterday.   -Patient was extubated on 10/17. Requiring intermittent BiPAP. OGT was removed.  -Diet was advanced to heart healthy/carbohydrate modified on 10/18.  Spoke with patient and family members at bedside. At time of assessment patient had placed order for breakfast but had not been able to eat yet. Family reports she had not eaten well for a few days before admission due to her difficulty breathing. She normally had a good appetite and ate well. Family reports she has had some weight loss during admission from fluid loss.  Meal Completion: 25% of breakfast this morning.  Medications reviewed and include: Colace, ferrous sulfate 325 mg BID, Novolog 0-20 units Q4hrs, Lantus 50 units daily, methylprednisolone 40 mg Q12hrs, liquid MVI daily, pantoprazole, sennosides.  Labs reviewed: CBG 141-258, Chloride 100, CO2 33, BUN 49, Creatinine 1.41.  I/O: 2805 ml UOP yesterday (0.9 ml/kg/hr); 1 bowel movement  Weight trend: 135.4 kg on 10/13; -7.6 kg from highest weight of 143 kg on 10/9  Discussed with RN.  Diet Order:  Diet heart healthy/carb modified Room  service appropriate? Yes; Fluid consistency: Thin  Skin:  Reviewed, no issues  Last BM:  04/25/2017 - smear type 6  Height:   Ht Readings from Last 1 Encounters:  04/19/17 5' 5"  (1.651 m)    Weight:   Wt Readings from Last 1 Encounters:  04/30/17 298 lb 8.1 oz (135.4 kg)    Ideal Body Weight:  56.81 kg  BMI:  Body mass index is 49.67 kg/m.  Estimated Nutritional Needs:   Kcal:  2060-2215 (MSJ x 1.1-1.2)  Protein:  120-135 grams (0.9-1 grams/kg)  Fluid:  1.4-1.7 L/day (25-30 ml/kg IBW)  EDUCATION NEEDS:   Education needs addressed  Willey Blade, [place], [place], [place] Office: [phone] Pager: [phone] After Hours/Weekend Pager: [phone]

## 2017-04-30 NOTE — Progress Notes (Signed)
Patient switched to [place] 4 liters. Some stridor noted, oxygenating upper 90's to 100%. Patient states that she is breathing well but scared of requiring intubation. Dr Leonidas Romberg aware of stridor. Nurse reinforced patient to breath through nose and blow out/positive reinforcement.

## 2017-04-30 NOTE — Progress Notes (Signed)
[facility] [place] NAME: Veronica Anderson    MR#:  [id number]  DATE OF BIRTH:  [birth year]  SUBJECTIVE:  CHIEF COMPLAINT:   Chief Complaint  Patient presents with  . Respiratory Distress   -Extubated yesterday. Continues to feel short of breath. On BiPAP this morning -Son at bedside.  REVIEW OF SYSTEMS:  Review of Systems  Constitutional: Positive for malaise/fatigue. Negative for chills and fever.  HENT: Negative for congestion, ear discharge, hearing loss and nosebleeds.   Eyes: Negative for blurred vision and double vision.  Respiratory: Positive for cough and shortness of breath. Negative for wheezing.   Cardiovascular: Negative for chest pain, palpitations, claudication and leg swelling.  Gastrointestinal: Negative for abdominal pain, constipation, diarrhea, nausea and vomiting.  Genitourinary: Negative for dysuria and urgency.  Musculoskeletal: Negative for myalgias.  Neurological: Negative for dizziness, sensory change, speech change, focal weakness, seizures and headaches.  Psychiatric/Behavioral: Negative for depression.    DRUG ALLERGIES:   Allergies  Allergen Reactions  . Ace Inhibitors Cough    VITALS:  Blood pressure (!) 158/80, pulse 93, temperature 98.5 F (36.9 C), temperature source Axillary, resp. rate (!) 26, height 5\' 5"  (1.651 m), weight 135.4 kg (298 lb 8.1 oz), SpO2 100 %.  PHYSICAL EXAMINATION:  Physical Exam  GENERAL:  71 y.o.-year-old obese patient lying in the bed with no acute distress.  EYES: Pupils equal, round, reactive to light and accommodation. No scleral icterus. Extraocular muscles intact.  HEENT: Head atraumatic, normocephalic. Oropharynx and nasopharynx clear.  NECK:  Supple, no jugular venous distention. No thyroid enlargement, no tenderness.  LUNGS: Normal breath sounds bilaterally, no wheezing, rales,rhonchi or crepitation. No use of accessory muscles of respiration. Decreased at the  bases No stridor today CARDIOVASCULAR: S1, S2 normal. No murmurs, rubs, or gallops.  ABDOMEN: Soft, nontender, nondistended. Bowel sounds present. No organomegaly or mass.  EXTREMITIES: No pedal edema, cyanosis, or clubbing.  NEUROLOGIC: Cranial nerves II through XII are intact. Muscle strength 5/5 in all extremities. Sensation intact. Gait not checked.  PSYCHIATRIC: The patient is alert and oriented x 3.  SKIN: No obvious rash, lesion, or ulcer.    LABORATORY PANEL:   CBC  Recent Labs Lab 04/30/17 0418  WBC 11.3*  HGB 8.9*  HCT 28.9*  PLT 383   ------------------------------------------------------------------------------------------------------------------  Chemistries   Recent Labs Lab 04/26/17 1058  04/28/17 0340  04/30/17 0418  NA 146*  < > 145  < > 144  K 3.8  < > 3.2*  < > 4.1  CL 98*  < > 102  < > 100*  CO2 37*  < > 34*  < > 33*  GLUCOSE 182*  < > 207*  < > 152*  BUN 63*  < > 57*  < > 49*  CREATININE 2.08*  < > 1.53*  < > 1.41*  CALCIUM 7.8*  < > 7.8*  < > 9.0  MG 2.1  --   --   --   --   AST  --   --  23  --   --   ALT  --   --  15  --   --   ALKPHOS  --   --  67  --   --   BILITOT  --   --  0.7  --   --   < > = values in this interval not displayed. ------------------------------------------------------------------------------------------------------------------  Cardiac Enzymes No results for input(s): TROPONINI in the  last 168 hours. ------------------------------------------------------------------------------------------------------------------  RADIOLOGY:  Dg Chest Port 1 View  Result Date: 04/30/2017 CLINICAL DATA:  Respiratory failure. EXAM: PORTABLE CHEST 1 VIEW COMPARISON:  04/29/2017. FINDINGS: Interval removal of endotracheal tube. Right IJ line noted with tip at cavoatrial junction. Stable cardiomegaly. Interim partial clearing of bilateral pulmonary infiltrates/ edema. Interim near complete clearing of left pleural effusion. Findings  consistent with improving CHF. No pneumothorax. IMPRESSION: 1. Interim removal of endotracheal tube. Right IJ line stable position. 2. Interim partial clearing of bilateral pulmonary infiltrates/edema. Interim near complete clearing of left pleural effusion. Findings consistent improving CHF. Electronically Signed   By: Marcello Moores  Register   On: 04/30/2017 06:34   Dg Chest Port 1 View  Result Date: 04/29/2017 CLINICAL DATA:  Respiratory failure. EXAM: PORTABLE CHEST 1 VIEW COMPARISON:  Radiograph of April 28, 2017. FINDINGS: Stable cardiomediastinal silhouette. Endotracheal and nasogastric tubes are unchanged in position. Right internal jugular catheter is unchanged in position. Stable mild bilateral pulmonary edema is noted, with probable mild left pleural effusion. Bony thorax is unremarkable. IMPRESSION: Stable support apparatus. Stable bilateral pulmonary edema is noted with associated mild left pleural effusion. Electronically Signed   By: Marijo Conception, M.D.   On: 04/29/2017 07:45    EKG:   Orders placed or performed during the hospital encounter of 04/19/17  . ED EKG  . ED EKG  . EKG 12-Lead  . EKG 12-Lead  . EKG  . EKG 12-Lead  . EKG 12-Lead    ASSESSMENT AND PLAN:    71 year old female, morbidly obese with past medical history significant for chronic diastolic heart failure, hypertension, diabetes and CK D stage III presents to hospital secondary to worsening shortness of breath.  * Acute on chronic hypoxic respiratory failure - Continue to acute on chronic diastolic CHF exacerbation. - failed Bipap initially, intubated and extubated on 04/29/17- now on Bipap - wean as tolerated- management per pulmonary team -At home she is on 2L oxygen and Cpap at bedtime -Continue IV Lasix. Receiving steroids for stridor post extubation - continue inhalers and neb -Monitor input and output - Echocardiogram with diastolic dysfunction  * Hypertension.  Metoprolol and clonidine Also  on hydralazine prn  * Insulin-dependent diabetes mellitus. ON lantus. Sliding scale insulin.  * CKD stage III is stable.  Monitor while on diuretics  * Anemia of chronic disease- stable hb, monitor No indication for Tx  * DVT prophylaxis- would recommend SQ heparin On TEDs and SCDs  Son at bedside. Discussed with Intensivist     All the records are reviewed and case discussed with Care Management/Social Workerr. Management plans discussed with the patient, family and they are in agreement.  CODE STATUS:Full Code  TOTAL TIME TAKING CARE OF THIS PATIENT: 38 minutes.   POSSIBLE D/C IN 2-3 DAYS, DEPENDING ON CLINICAL CONDITION.   Gladstone Lighter M.D on 04/30/2017 at 9:48 AM  Between 7am to 6pm - Pager - [phone]  After 6pm go to [email or web address] - password [email or web address] [facility] [place]  Office  [phone]  CC: Primary care physician; Hermelinda Dellen, DO

## 2017-04-30 NOTE — Progress Notes (Signed)
PULMONARY / CRITICAL CARE MEDICINE   Name: Veronica Anderson MRN: [id number] DOB: 1945/11/29    ADMISSION DATE:  04/19/2017  PT PROFILE: 63 F adm 10/07 to hospitalist service with diagnosis of acute on chronic diastolic congestive heart failure, diabetes, stage III CKD. Admitted to SDU requiring BiPAP. Seen in consultation by PCCM at that time. Underwent intubation 04/23/17 for worsening respiratory failure  MAJOR EVENTS/TEST RESULTS: 10/07 Admission 10/07 PCCM consultation 10/12 nephrology consultation 10/12 CT Chest: Patchy left lower lobe opacity, suspicious for pneumonia. Additional mild patchy opacity in the posterior right upper lobe and right middle lobe, atelectasis versus pneumonia. Moderate bilateral pleural effusions 10/13 transfused 2 units PRBCs for hemoglobin 6.8, 6.6 10/15 gastroenterology consultation - no endoscopy planned 10/17 Passed SBT and extubated. Post-extubation stridor > BiPAP, methylprednisolone. Edema pattern on CXR > furosemide 10/18 continues to require intermittent BiPAP. Overall improved  INDWELLING DEVICES:: ETT 10/11>>10/17 R IJ CVL 10/11 >>   MICRO DATA: MRSA PCR 10/07>>POS  ANTIMICROBIALS:  Vanc 10/14 >>10/15 Pip-tazo 10/14 >>10/15  SUBJECTIVE:  Pt currently off Bipap tolerating well   VITAL SIGNS: BP (!) 161/67   Pulse 95   Temp 98.5 F (36.9 C) (Axillary)   Resp (!) 23   Ht 5\' 5"  (1.651 m)   Wt 135.4 kg (298 lb 8.1 oz)   SpO2 100%   BMI 49.67 kg/m   HEMODYNAMICS:    VENTILATOR SETTINGS: FiO2 (%):  [30 %] 30 %  INTAKE / OUTPUT: I/O last 3 completed shifts: In: 2340 [I.V.:1670; NG/GT:520; IV Piggyback:150] Out: 3486 [Urine:3485; Stool:1]  PHYSICAL EXAMINATION: General: obese AA female, in respiratory distress on BIpap  Neuro: alert and oriented, follows commands  HEENT: NCAT, sclerae white Cardiovascular: sinus tach, s1s2, no M/R/G  Lungs: inspiratory stridor slightly improved, faint wheezes throughout, even, non labored   Abdomen: obese, soft, +BS x4 Extremities: 1+ bilateral lower extremity edema  LABS:  BMET  Recent Labs Lab 04/28/17 0340 04/29/17 0302 04/30/17 0418  NA 145 146* 144  K 3.2* 3.3* 4.1  CL 102 102 100*  CO2 34* 33* 33*  BUN 57* 52* 49*  CREATININE 1.53* 1.50* 1.41*  GLUCOSE 207* 168* 152*    Electrolytes  Recent Labs Lab 04/23/17 1211 04/24/17 0608 04/25/17 0849  04/26/17 1058  04/28/17 0340 04/29/17 0302 04/30/17 0418  CALCIUM 8.5* 8.3* 7.7*  < > 7.8*  < > 7.8* 8.7* 9.0  MG 2.2 2.0  --   --  2.1  --   --   --   --   PHOS  --  1.6* 3.8  --  3.0  --   --   --   --   < > = values in this interval not displayed.  CBC  Recent Labs Lab 04/28/17 0340 04/29/17 0302 04/30/17 0418  WBC 11.1* 9.9 11.3*  HGB 7.2* 7.4* 8.9*  HCT 23.5* 23.9* 28.9*  PLT 258 307 383    Coag's No results for input(s): APTT, INR in the last 168 hours.  Sepsis Markers No results for input(s): LATICACIDVEN, PROCALCITON, O2SATVEN in the last 168 hours.  ABG  Recent Labs Lab 04/23/17 1220 04/23/17 1640 04/23/17 2045  PHART 7.35 7.33* 7.54*  PCO2ART 100* 105* 60*  PO2ART 57* 79* 109*    Liver Enzymes  Recent Labs Lab 04/28/17 0340  AST 23  ALT 15  ALKPHOS 67  BILITOT 0.7  ALBUMIN 2.0*    Cardiac Enzymes No results for input(s): TROPONINI, PROBNP in the last 168 hours.  Glucose  Recent Labs Lab 04/29/17 1125 04/29/17 1632 04/29/17 1940 04/29/17 2337 04/30/17 0407 04/30/17 0749  GLUCAP 141* 234* 258* 172* 141* 158*    CXR: CM, edema pattern   ASSESSMENT / PLAN:  PULMONARY A: Acute respiratory failure-stridor post extubation 04/29/17 Pulmonary edema Stridor-improving  P:   Supplemental O2 or Bipap to maintain O2 sats >92% Continue scheduled bronchodilator therapy  IV lasix IV steroids Repeat CXR in am   CARDIOVASCULAR A:  HFpEF Hypertension P:  Continue clonidine patch dose increased 10/17 and prn iv hydralazine maintain SBP < 170  mmHg Hemodynamic monitoring per ICU protocol  RENAL A:   CKD AKI - creatinine improving Hypernatremia-resolved  Hypokalemia-resolved P:   Monitor BMET intermittently Monitor I/Os Correct electrolytes as indicated  Discontinue iv fluids    GASTROINTESTINAL A:   Obesity P:   SUP: IV PPI Diet ordered   HEMATOLOGIC A:   Acute blood loss anemia - no overt bleeding presently Microcytosis P:  DVT px: SCDs Monitor CBC intermittently Transfuse per usual guidelines   INFECTIOUS A:   Doubt pneumonia No definite infection identified P:   Monitor temp, WBC count Micro and abx as above   ENDOCRINE A:   DM 2 With severe hyperglycemia P:    Continue SSI and lantus   NEUROLOGIC A:   No acute issues  P:   Avoid sedating medications  FAMILY: Pts son updated regarding plan of care and all questions answered 04/30/17  Marda Stalker, [place] Pager [phone] (please enter 7 digits) PCCM Consult Pager [phone] (please enter [phone])   PCCM ATTENDING ATTESTATION:  I have evaluated patient with the APP [place], reviewed database in its entirety and discussed care plan in detail. In addition, this patient was discussed on multidisciplinary rounds.   Important exam findings: No overt distress Mild stridor - improved Bibasilar crackles No LE edema  Major problems addressed by PCCM team: Acute/chronic respiratory failure Pulmonary edema Postextubation stridor Type 2 diabetes OHS/OSA  PLAN/REC: Today's plan is accurately reflected in the noted above and was discussed in detail on multidisciplinary rounds  Merton Border, MD PCCM service Mobile [phone] Pager [phone] 04/30/2017 12:03 PM

## 2017-05-01 ENCOUNTER — Inpatient Hospital Stay: Payer: Medicare Other

## 2017-05-01 DIAGNOSIS — R061 Stridor: Secondary | ICD-10-CM

## 2017-05-01 LAB — BASIC METABOLIC PANEL
ANION GAP: 9 (ref 5–15)
BUN: 53 mg/dL — ABNORMAL HIGH (ref 6–20)
CO2: 34 mmol/L — AB (ref 22–32)
Calcium: 9 mg/dL (ref 8.9–10.3)
Chloride: 103 mmol/L (ref 101–111)
Creatinine, Ser: 1.37 mg/dL — ABNORMAL HIGH (ref 0.44–1.00)
GFR calc Af Amer: 44 mL/min — ABNORMAL LOW (ref >60)
GFR, EST NON AFRICAN AMERICAN: 38 mL/min — AB (ref >60)
GLUCOSE: 97 mg/dL (ref 65–99)
POTASSIUM: 3.7 mmol/L (ref 3.5–5.1)
Sodium: 146 mmol/L — ABNORMAL HIGH (ref 135–145)

## 2017-05-01 LAB — GLUCOSE, CAPILLARY
Glucose-Capillary: 90 mg/dL (ref 65–99)
Glucose-Capillary: 103 mg/dL — ABNORMAL HIGH (ref 65–99)
Glucose-Capillary: 174 mg/dL — ABNORMAL HIGH (ref 65–99)
Glucose-Capillary: 110 mg/dL — ABNORMAL HIGH (ref 65–99)
Glucose-Capillary: 107 mg/dL — ABNORMAL HIGH (ref 65–99)

## 2017-05-01 LAB — CBC
HEMATOCRIT: 28 % — AB (ref 35.0–47.0)
HEMOGLOBIN: 8.8 g/dL — AB (ref 12.0–16.0)
MCH: 23.2 pg — ABNORMAL LOW (ref 26.0–34.0)
MCHC: 31.5 g/dL — AB (ref 32.0–36.0)
MCV: 73.6 fL — ABNORMAL LOW (ref 80.0–100.0)
Platelets: 443 10*3/uL — ABNORMAL HIGH (ref 150–440)
RBC: 3.8 MIL/uL (ref 3.80–5.20)
RDW: 21.5 % — ABNORMAL HIGH (ref 11.5–14.5)
WBC: 10.6 10*3/uL (ref 3.6–11.0)

## 2017-05-01 MED ORDER — HYDRALAZINE HCL 50 MG PO TABS
50.0000 mg | ORAL_TABLET | Freq: Three times a day (TID) | ORAL | Status: DC
  Administered 2017-05-01 – 2017-05-05 (×13): 50 mg via ORAL
  Filled 2017-05-01 (×13): qty 1

## 2017-05-01 MED ORDER — INSULIN ASPART 100 UNIT/ML ~~LOC~~ SOLN: 0.0000 [IU] | Freq: Every day | SUBCUTANEOUS | Status: DC

## 2017-05-01 MED ORDER — INSULIN ASPART 100 UNIT/ML ~~LOC~~ SOLN
4.0000 [IU] | Freq: Three times a day (TID) | SUBCUTANEOUS | Status: DC
  Administered 2017-05-01 (×2): 4 [IU] via SUBCUTANEOUS
  Filled 2017-05-01 (×2): qty 1

## 2017-05-01 MED ORDER — GABAPENTIN 100 MG PO CAPS
200.0000 mg | ORAL_CAPSULE | Freq: Three times a day (TID) | ORAL | Status: DC
  Administered 2017-05-01 – 2017-05-05 (×13): 200 mg via ORAL
  Filled 2017-05-01 (×14): qty 2

## 2017-05-01 MED ORDER — FUROSEMIDE 40 MG PO TABS
40.0000 mg | ORAL_TABLET | Freq: Every day | ORAL | Status: DC
  Administered 2017-05-01 – 2017-05-05 (×5): 40 mg via ORAL
  Filled 2017-05-01: qty 2
  Filled 2017-05-01: qty 1
  Filled 2017-05-01: qty 2
  Filled 2017-05-01 (×2): qty 1
  Filled 2017-05-01: qty 2

## 2017-05-01 MED ORDER — ACETAMINOPHEN 325 MG PO TABS
650.0000 mg | ORAL_TABLET | Freq: Four times a day (QID) | ORAL | Status: DC | PRN
  Administered 2017-05-01 – 2017-05-04 (×6): 650 mg via ORAL
  Filled 2017-05-01 (×6): qty 2

## 2017-05-01 MED ORDER — INSULIN ASPART 100 UNIT/ML ~~LOC~~ SOLN
0.0000 [IU] | Freq: Three times a day (TID) | SUBCUTANEOUS | Status: DC
  Administered 2017-05-01: 3 [IU] via SUBCUTANEOUS
  Administered 2017-05-02: 2 [IU] via SUBCUTANEOUS
  Administered 2017-05-03 (×2): 3 [IU] via SUBCUTANEOUS
  Administered 2017-05-04 – 2017-05-05 (×3): 2 [IU] via SUBCUTANEOUS
  Administered 2017-05-05: 3 [IU] via SUBCUTANEOUS
  Filled 2017-05-01 (×8): qty 1

## 2017-05-01 MED ORDER — INSULIN GLARGINE 100 UNIT/ML ~~LOC~~ SOLN
40.0000 [IU] | Freq: Every day | SUBCUTANEOUS | Status: DC
  Administered 2017-05-01: 40 [IU] via SUBCUTANEOUS
  Filled 2017-05-01 (×2): qty 0.4

## 2017-05-01 NOTE — Progress Notes (Signed)
[facility] NAME: Veronica Anderson    MR#:  [id number]  DATE OF BIRTH:  1946/01/23  SUBJECTIVE:  CHIEF COMPLAINT:   Chief Complaint  Patient presents with  . Respiratory Distress   -Extubated 04/29/17. Off bipap and transitioned to nasal cannula this am, still has rough cough - feels weak   REVIEW OF SYSTEMS:  Review of Systems  Constitutional: Positive for malaise/fatigue. Negative for chills and fever.  HENT: Negative for congestion, ear discharge, hearing loss and nosebleeds.   Eyes: Negative for blurred vision and double vision.  Respiratory: Positive for cough and shortness of breath. Negative for wheezing.   Cardiovascular: Negative for chest pain, palpitations, claudication and leg swelling.  Gastrointestinal: Negative for abdominal pain, constipation, diarrhea, nausea and vomiting.  Genitourinary: Negative for dysuria and urgency.  Musculoskeletal: Negative for myalgias.  Neurological: Negative for dizziness, sensory change, speech change, focal weakness, seizures and headaches.  Psychiatric/Behavioral: Negative for depression.    DRUG ALLERGIES:   Allergies  Allergen Reactions  . Ace Inhibitors Cough    VITALS:  Blood pressure (!) 155/82, pulse 74, temperature 98.2 F (36.8 C), temperature source Axillary, resp. rate 17, height 5\' 5"  (1.651 m), weight 131.7 kg (290 lb 5.5 oz), SpO2 100 %.  PHYSICAL EXAMINATION:  Physical Exam  GENERAL:  71 y.o.-year-old obese patient lying in the bed with no acute distress.  EYES: Pupils equal, round, reactive to light and accommodation. No scleral icterus. Extraocular muscles intact.  HEENT: Head atraumatic, normocephalic. Oropharynx and nasopharynx clear.  NECK:  Supple, no jugular venous distention. No thyroid enlargement, no tenderness.  LUNGS: Normal breath sounds except some coarseness at the bases,, scattered wheezing, , no rales,rhonchi or crepitation. No use of  accessory muscles of respiration. Decreased at the bases No stridor today CARDIOVASCULAR: S1, S2 normal. No murmurs, rubs, or gallops.  ABDOMEN: Soft, nontender, nondistended. Bowel sounds present. No organomegaly or mass.  EXTREMITIES: No pedal edema, cyanosis, or clubbing.  NEUROLOGIC: Cranial nerves II through XII are intact. Muscle strength 5/5 in all extremities. Sensation intact. Gait not checked.  PSYCHIATRIC: The patient is alert and oriented x 3.  SKIN: No obvious rash, lesion, or ulcer.    LABORATORY PANEL:   CBC  Recent Labs Lab 05/01/17 0350  WBC 10.6  HGB 8.8*  HCT 28.0*  PLT 443*   ------------------------------------------------------------------------------------------------------------------  Chemistries   Recent Labs Lab 04/26/17 1058  04/28/17 0340  05/01/17 0350  NA 146*  < > 145  < > 146*  K 3.8  < > 3.2*  < > 3.7  CL 98*  < > 102  < > 103  CO2 37*  < > 34*  < > 34*  GLUCOSE 182*  < > 207*  < > 97  BUN 63*  < > 57*  < > 53*  CREATININE 2.08*  < > 1.53*  < > 1.37*  CALCIUM 7.8*  < > 7.8*  < > 9.0  MG 2.1  --   --   --   --   AST  --   --  23  --   --   ALT  --   --  15  --   --   ALKPHOS  --   --  67  --   --   BILITOT  --   --  0.7  --   --   < > = values in this interval not displayed. ------------------------------------------------------------------------------------------------------------------  Cardiac Enzymes No results for input(s): TROPONINI in the last 168 hours. ------------------------------------------------------------------------------------------------------------------  RADIOLOGY:  Dg Chest Port 1 View  Result Date: 05/01/2017 CLINICAL DATA:  71 year old female with history of respiratory failure. EXAM: PORTABLE CHEST 1 VIEW COMPARISON:  Chest x-ray 04/30/2017. FINDINGS: Right internal jugular single-lumen porta cath with tip terminating at the superior cavoatrial junction. Lung volumes are low. No acute consolidative  airspace disease. No pleural effusions. Mild crowding of the pulmonary vasculature, related at low lung volumes, without frank pulmonary edema. Mild cardiomegaly. The patient is rotated to the right on today's exam, resulting in distortion of the mediastinal contours and reduced diagnostic sensitivity and specificity for mediastinal pathology. IMPRESSION: 1. Low lung volumes without radiographic evidence of acute cardiopulmonary disease. 2. Mild cardiomegaly. Electronically Signed   By: Vinnie Langton M.D.   On: 05/01/2017 07:13   Dg Chest Port 1 View  Result Date: 04/30/2017 CLINICAL DATA:  Respiratory failure. EXAM: PORTABLE CHEST 1 VIEW COMPARISON:  04/29/2017. FINDINGS: Interval removal of endotracheal tube. Right IJ line noted with tip at cavoatrial junction. Stable cardiomegaly. Interim partial clearing of bilateral pulmonary infiltrates/ edema. Interim near complete clearing of left pleural effusion. Findings consistent with improving CHF. No pneumothorax. IMPRESSION: 1. Interim removal of endotracheal tube. Right IJ line stable position. 2. Interim partial clearing of bilateral pulmonary infiltrates/edema. Interim near complete clearing of left pleural effusion. Findings consistent improving CHF. Electronically Signed   By: Marcello Moores  Register   On: 04/30/2017 06:34    EKG:   Orders placed or performed during the hospital encounter of 04/19/17  . ED EKG  . ED EKG  . EKG 12-Lead  . EKG 12-Lead  . EKG  . EKG 12-Lead  . EKG 12-Lead    ASSESSMENT AND PLAN:    71 year old female, morbidly obese with past medical history significant for chronic diastolic heart failure, hypertension, diabetes and CK D stage III presents to hospital secondary to worsening shortness of breath.  * Acute on chronic hypoxic respiratory failure - Continue to acute on chronic diastolic CHF exacerbation. - failed Bipap initially, intubated and extubated on 04/29/17- now on Bipap - wean as tolerated- management  per pulmonary team -At home she is on 2L oxygen and Cpap at bedtime -Continue IV Lasix. Receiving steroids for stridor post extubation - continue inhalers and neb -Monitor input and output - Echocardiogram with diastolic dysfunction  * Hypertension.  Metoprolol, norvasc and clonidine Also on hydralazine prn  * Insulin-dependent diabetes mellitus. ON lantus. Sliding scale insulin.  * CKD stage III is stable.  Monitor while on diuretics  * Anemia of chronic disease- stable hb, monitor Received Tx this adm On iron supplements  * DVT prophylaxis- On TEDs and SCDs Not on heparin products due to anemia requiring transfusion this admission  Grand daughter at bedside. Discussed with Intensivist Anticipate transfer out of ICU tomorrow if stable    All the records are reviewed and case discussed with Care Management/Social Workerr. Management plans discussed with the patient, family and they are in agreement.  CODE STATUS:Full Code  TOTAL TIME TAKING CARE OF THIS PATIENT: 36 minutes.   POSSIBLE D/C IN 2-3 DAYS, DEPENDING ON CLINICAL CONDITION.   Jiaire Rosebrook M.D on 05/01/2017 at 1:25 PM  Between 7am to 6pm - Pager - [phone]  After 6pm go to [email or web address] - password [email or web address] [facility]  Office  [phone]  CC: Primary care physician; Hermelinda Dellen, DO

## 2017-05-01 NOTE — Progress Notes (Addendum)
Good day. Remained on nasal oxygen all day. Sort of breath and anxiety  when dangling and standing.  Unable to determine if the anxiety caused the shortness of breath or vice versa. Output good today. Responded well to po Lasix. Family in and out and very supportive of patient. Afebrile with good strong cough and large amount of sputum noted.

## 2017-05-01 NOTE — Progress Notes (Signed)
PULMONARY / CRITICAL CARE MEDICINE   Name: Veronica Anderson MRN: [id number] DOB: 06-22-1946    ADMISSION DATE:  04/19/2017  PT PROFILE: 20 F adm 10/07 to hospitalist service with diagnosis of acute on chronic diastolic congestive heart failure, diabetes, stage III CKD. Admitted to SDU requiring BiPAP. Seen in consultation by PCCM at that time. Underwent intubation 04/23/17 for worsening respiratory failure  MAJOR EVENTS/TEST RESULTS: 10/07 Admission 10/07 PCCM consultation 10/12 nephrology consultation 10/12 CT Chest: Patchy left lower lobe opacity, suspicious for pneumonia. Additional mild patchy opacity in the posterior right upper lobe and right middle lobe, atelectasis versus pneumonia. Moderate bilateral pleural effusions 10/13 transfused 2 units PRBCs for hemoglobin 6.8, 6.6 10/15 gastroenterology consultation - no endoscopy planned 10/17 Passed SBT and extubated. Post-extubation stridor > BiPAP, methylprednisolone. Edema pattern on CXR > furosemide 10/18 continues to require intermittent BiPAP. Overall improved 10/19 remains off Bipap and tolerating well   INDWELLING DEVICES:: ETT 10/11>>10/17 R IJ CVL 10/11 >>   MICRO DATA: MRSA PCR 10/07>>POS  ANTIMICROBIALS:  Vanc 10/14 >>10/15 Pip-tazo 10/14 >>10/15  SUBJECTIVE:  Pt remains off Bipap and tolerating well.   VITAL SIGNS: BP (!) 155/82 (BP Location: Right Arm)   Pulse 74   Temp 98.2 F (36.8 C) (Axillary)   Resp 17   Ht 5\' 5"  (1.651 m)   Wt 131.7 kg (290 lb 5.5 oz)   SpO2 100%   BMI 48.32 kg/m   HEMODYNAMICS:    VENTILATOR SETTINGS:    INTAKE / OUTPUT: I/O last 3 completed shifts: In: 950 [P.O.:200; I.V.:750] Out: 2485 [Urine:2485]  PHYSICAL EXAMINATION: General: obese AA female, NAD Neuro: alert and oriented, follows commands  HEENT: NCAT, sclerae white Cardiovascular: nsr, s1s2, no M/R/G  Lungs: inspiratory stridor significantly improved, diminished throughout, even, non labored  Abdomen: obese,  soft, +BS x4 Extremities: 1+ bilateral lower extremity edema  LABS:  BMET  Recent Labs Lab 04/29/17 0302 04/30/17 0418 05/01/17 0350  NA 146* 144 146*  K 3.3* 4.1 3.7  CL 102 100* 103  CO2 33* 33* 34*  BUN 52* 49* 53*  CREATININE 1.50* 1.41* 1.37*  GLUCOSE 168* 152* 97    Electrolytes  Recent Labs Lab 04/25/17 0849  04/26/17 1058  04/29/17 0302 04/30/17 0418 05/01/17 0350  CALCIUM 7.7*  < > 7.8*  < > 8.7* 9.0 9.0  MG  --   --  2.1  --   --   --   --   PHOS 3.8  --  3.0  --   --   --   --   < > = values in this interval not displayed.  CBC  Recent Labs Lab 04/29/17 0302 04/30/17 0418 05/01/17 0350  WBC 9.9 11.3* 10.6  HGB 7.4* 8.9* 8.8*  HCT 23.9* 28.9* 28.0*  PLT 307 383 443*    Coag's No results for input(s): APTT, INR in the last 168 hours.  Sepsis Markers No results for input(s): LATICACIDVEN, PROCALCITON, O2SATVEN in the last 168 hours.  ABG No results for input(s): PHART, PCO2ART, PO2ART in the last 168 hours.  Liver Enzymes  Recent Labs Lab 04/28/17 0340  AST 23  ALT 15  ALKPHOS 67  BILITOT 0.7  ALBUMIN 2.0*    Cardiac Enzymes No results for input(s): TROPONINI, PROBNP in the last 168 hours.  Glucose  Recent Labs Lab 04/30/17 1144 04/30/17 1606 04/30/17 1944 04/30/17 2336 05/01/17 0348 05/01/17 0751  GLUCAP 167* 188* 190* 159* 90 103*     ASSESSMENT / PLAN:  PULMONARY A: Acute respiratory failure-stridor post extubation 04/29/17 Pulmonary edema-improving  Stridor-significantly improving  P:   Supplemental O2 or Bipap to maintain O2 sats >92% Continue scheduled bronchodilator therapy  Prn IV lasix Continue IV steroids wean as tolerated  Prn CXR   CARDIOVASCULAR A:  HFpEF Hypertension P:  Continue clonidine patch dose increased 10/17 and prn iv hydralazine maintain SBP < 170 mmHg Continue outpatient amlodipine and hydralazine  Hemodynamic monitoring per ICU protocol  RENAL A:   CKD AKI - creatinine  improving Hypernatremia-resolved  Hypokalemia-resolved P:   Monitor BMET intermittently Monitor I/Os Correct electrolytes as indicated   GASTROINTESTINAL A:   Obesity P:   SUP: IV PPI Continue current diet   HEMATOLOGIC A:   Acute blood loss anemia - no overt bleeding presently Microcytosis P:  DVT px: SCDs Monitor CBC intermittently Transfuse per usual guidelines   INFECTIOUS A:   Doubt pneumonia No definite infection identified P:   Monitor temp, WBC count Micro and abx as above   ENDOCRINE A:   DM 2 With severe hyperglycemia P:    Continue SSI and lantus   NEUROLOGIC A:   No acute issues  P:   Avoid sedating medications PT consult placed frequent OOB to Chair as tolerated   FAMILY: Pts son updated regarding plan of care and all questions answered 05/01/17  -Will change to Stepdown Status today 05/01/17  Marda Stalker, [place] Pager [phone] (please enter 7 digits) PCCM Consult Pager [phone] (please enter 7 digits)  PCCM ATTENDING ATTESTATION:  I have evaluated patient with the APP [place], reviewed database in its entirety and discussed care plan in detail. In addition, this patient was discussed on multidisciplinary rounds.   Important exam findings: Stridor improved No respiratory distress Rest of exam is unchanged  Major problems addressed by PCCM team: As documented above  PLAN/REC: As documented above    Merton Border, MD PCCM service Mobile [phone] Pager [phone] 05/01/2017 12:55 PM

## 2017-05-01 NOTE — Progress Notes (Signed)
[facility]  ROUNDING NOTE   Subjective:   Family at bedside.   On [place] this morning  Objective:  Vital signs in last 24 hours:  Temp:  [98 F (36.7 C)-98.7 F (37.1 C)] 98.2 F (36.8 C) (10/19 0200) Pulse Rate:  [69-98] 74 (10/19 0600) Resp:  [13-28] 17 (10/19 0600) BP: (140-182)/(56-101) 155/82 (10/19 0600) SpO2:  [100 %] 100 % (10/19 0600) Weight:  [131.7 kg (290 lb 5.5 oz)] 131.7 kg (290 lb 5.5 oz) (10/19 0544)  Weight change: -3.7 kg (-8 lb 2.5 oz) Filed Weights   04/29/17 0222 04/30/17 0432 05/01/17 0544  Weight: 136 kg (299 lb 13.2 oz) 135.4 kg (298 lb 8.1 oz) 131.7 kg (290 lb 5.5 oz)    Intake/Output: I/O last 3 completed shifts: In: 950 [P.O.:200; I.V.:750] Out: 2485 [Urine:2485]   Intake/Output this shift:  No intake/output data recorded.  Physical Exam: General: critically ill appearing  Head: [place]   Eyes: Anicteric  Neck: Right IJ central line  Lungs:  Mild stridor  Heart: regular  Abdomen:  Soft, nontender   Extremities: No peripheral and dependent edema.  Neurologic: Intubated, and sedated. Following commands off sedation  Skin: No lesions       Basic Metabolic Panel:  Recent Labs Lab 04/25/17 0849  04/26/17 1058 04/27/17 0538 04/28/17 0340 04/29/17 0302 04/30/17 0418 05/01/17 0350  NA 144  < > 146* 147* 145 146* 144 146*  K 4.1  < > 3.8 3.5 3.2* 3.3* 4.1 3.7  CL 97*  < > 98* 102 102 102 100* 103  CO2 36*  < > 37* 36* 34* 33* 33* 34*  GLUCOSE 226*  < > 182* 141* 207* 168* 152* 97  BUN 44*  < > 63* 63* 57* 52* 49* 53*  CREATININE 2.23*  < > 2.08* 1.81* 1.53* 1.50* 1.41* 1.37*  CALCIUM 7.7*  < > 7.8* 7.8* 7.8* 8.7* 9.0 9.0  MG  --   --  2.1  --   --   --   --   --   PHOS 3.8  --  3.0  --   --   --   --   --   < > = values in this interval not displayed.  Liver Function Tests:  Recent Labs Lab 04/28/17 0340  AST 23  ALT 15  ALKPHOS 67  BILITOT 0.7  PROT 6.6  ALBUMIN 2.0*   No results for input(s): LIPASE, AMYLASE  in the last 168 hours. No results for input(s): AMMONIA in the last 168 hours.  CBC:  Recent Labs Lab 04/25/17 0515  04/26/17 1058  04/27/17 0538 04/28/17 0340 04/29/17 0302 04/30/17 0418 05/01/17 0350  WBC 9.1  --  11.6*  < > 13.8* 11.1* 9.9 11.3* 10.6  NEUTROABS 6.9*  --  9.1*  --   --   --   --   --   --   HGB 6.0*  < > 6.8*  < > 7.6* 7.2* 7.4* 8.9* 8.8*  HCT 19.8*  < > 22.8*  < > 24.0* 23.5* 23.9* 28.9* 28.0*  MCV 72.0*  --  73.3*  < > 73.8* 74.2* 74.3* 74.3* 73.6*  PLT 206  --  221  < > 232 258 307 383 443*  < > = values in this interval not displayed.  Cardiac Enzymes: No results for input(s): CKTOTAL, CKMB, CKMBINDEX, TROPONINI in the last 168 hours.  BNP: Invalid input(s): POCBNP  CBG:  Recent Labs Lab 04/30/17 1606 04/30/17  1944 04/30/17 2336 05/01/17 0348 05/01/17 0751  GLUCAP 188* 190* 159* 51 103*    Microbiology: Results for orders placed or performed during the hospital encounter of 04/19/17  MRSA PCR Screening     Status: Abnormal   Collection Time: 04/19/17 10:07 AM  Result Value Ref Range Status   MRSA by PCR POSITIVE (A) NEGATIVE Final    Comment:        The GeneXpert MRSA Assay (FDA approved for NASAL specimens only), is one component of a comprehensive MRSA colonization surveillance program. It is not intended to diagnose MRSA infection nor to guide or monitor treatment for MRSA infections. RESULT CALLED TO, READ BACK BY AND VERIFIED WITH: SANDRA BOREA ON 04/19/17 AT 1228 [facility]     Coagulation Studies: No results for input(s): LABPROT, INR in the last 72 hours.  Urinalysis: No results for input(s): COLORURINE, LABSPEC, PHURINE, GLUCOSEU, HGBUR, BILIRUBINUR, KETONESUR, PROTEINUR, UROBILINOGEN, NITRITE, LEUKOCYTESUR in the last 72 hours.  Invalid input(s): APPERANCEUR    Imaging: Dg Chest Port 1 View  Result Date: 05/01/2017 CLINICAL DATA:  71 year old female with history of respiratory failure. EXAM: PORTABLE CHEST 1 VIEW  COMPARISON:  Chest x-ray 04/30/2017. FINDINGS: Right internal jugular single-lumen porta cath with tip terminating at the superior cavoatrial junction. Lung volumes are low. No acute consolidative airspace disease. No pleural effusions. Mild crowding of the pulmonary vasculature, related at low lung volumes, without frank pulmonary edema. Mild cardiomegaly. The patient is rotated to the right on today's exam, resulting in distortion of the mediastinal contours and reduced diagnostic sensitivity and specificity for mediastinal pathology. IMPRESSION: 1. Low lung volumes without radiographic evidence of acute cardiopulmonary disease. 2. Mild cardiomegaly. Electronically Signed   By: Vinnie Langton M.D.   On: 05/01/2017 07:13   Dg Chest Port 1 View  Result Date: 04/30/2017 CLINICAL DATA:  Respiratory failure. EXAM: PORTABLE CHEST 1 VIEW COMPARISON:  04/29/2017. FINDINGS: Interval removal of endotracheal tube. Right IJ line noted with tip at cavoatrial junction. Stable cardiomegaly. Interim partial clearing of bilateral pulmonary infiltrates/ edema. Interim near complete clearing of left pleural effusion. Findings consistent with improving CHF. No pneumothorax. IMPRESSION: 1. Interim removal of endotracheal tube. Right IJ line stable position. 2. Interim partial clearing of bilateral pulmonary infiltrates/edema. Interim near complete clearing of left pleural effusion. Findings consistent improving CHF. Electronically Signed   By: Marcello Moores  Register   On: 04/30/2017 06:34     Medications:    . amLODipine  10 mg Oral Daily  . budesonide (PULMICORT) nebulizer solution  0.25 mg Nebulization Q6H  . chlorhexidine  15 mL Mouth Rinse BID  . cloNIDine  0.3 mg Transdermal Weekly  . ferrous sulfate  325 mg Oral BID WC  . furosemide  40 mg Oral Daily  . gabapentin  200 mg Oral TID  . hydrALAZINE  50 mg Oral TID  . insulin aspart  0-15 Units Subcutaneous TID WC  . insulin aspart  0-5 Units Subcutaneous QHS  .  insulin aspart  4 Units Subcutaneous TID WC  . insulin glargine  40 Units Subcutaneous Daily  . ipratropium-albuterol  3 mL Nebulization Q6H  . mouth rinse  15 mL Mouth Rinse q12n4p  . multivitamin with minerals  1 tablet Oral Daily  . protein supplement shake  11 oz Oral TID BM  . sennosides  5 mL Per Tube BID  . sodium chloride flush  10-40 mL Intracatheter Q12H  . sodium chloride flush  3 mL Intravenous Q12H   acetaminophen **OR** [DISCONTINUED]  acetaminophen, hydrALAZINE, metoprolol tartrate, ondansetron (ZOFRAN) IV, polyethylene glycol, sodium chloride flush  Assessment/ Plan:  71 y.o.black female with anemia, congestive heart failure, chronic kidney disease stage III, diabetes mellitus type 2, peripheral edema, GERD, hypertension, hyperlipidemia, peripheral neuropathy, obesity, history of CVA, obstructive sleep apnea  1. Acute renal failure on chronic kidney disease stage III Baseline creatinine of 1.65, GFR of 35 on 04/24/17. No urine studies available.  Chronic kidney disease secondary to diabetes, hypertension.  Acute renal failure secondary to overdiuresis - Restart furosemide 40mg  PO daily.   2. Hypernatremia: from free water deficit from overdiuresis.    3. Anemia with renal failure/chronic kidney disease: status post PRBC transfusion on 10/13 and 10/14.    LOS: 12 Charelle Petrakis 10/19/20189:52 AM

## 2017-05-01 NOTE — Progress Notes (Signed)
While rounding, Sampson made a follow up visit to room IC-13. Pt appeared to be in much better condition and stated that to be true. Family is bedside and are also in a good space. [place] engaged the family in light conversation and will follow up later in the round.    05/01/17 1100  Clinical Encounter Type  Visited With Patient;Patient and family together  Visit Type Follow-up  Consult/Referral To Chaplain

## 2017-05-02 DIAGNOSIS — I10 Essential (primary) hypertension: Secondary | ICD-10-CM

## 2017-05-02 DIAGNOSIS — D509 Iron deficiency anemia, unspecified: Secondary | ICD-10-CM

## 2017-05-02 DIAGNOSIS — N184 Chronic kidney disease, stage 4 (severe): Secondary | ICD-10-CM

## 2017-05-02 LAB — GLUCOSE, CAPILLARY
GLUCOSE-CAPILLARY: 54 mg/dL — AB (ref 65–99)
GLUCOSE-CAPILLARY: 106 mg/dL — AB (ref 65–99)
GLUCOSE-CAPILLARY: 134 mg/dL — AB (ref 65–99)
Glucose-Capillary: 156 mg/dL — ABNORMAL HIGH (ref 65–99)

## 2017-05-02 LAB — CBC
HEMATOCRIT: 27.4 % — AB (ref 35.0–47.0)
HEMOGLOBIN: 8.6 g/dL — AB (ref 12.0–16.0)
MCH: 23 pg — ABNORMAL LOW (ref 26.0–34.0)
MCHC: 31.6 g/dL — AB (ref 32.0–36.0)
MCV: 72.8 fL — ABNORMAL LOW (ref 80.0–100.0)
Platelets: 429 10*3/uL (ref 150–440)
RBC: 3.76 MIL/uL — AB (ref 3.80–5.20)
RDW: 21.2 % — ABNORMAL HIGH (ref 11.5–14.5)
WBC: 7.3 10*3/uL (ref 3.6–11.0)

## 2017-05-02 LAB — BASIC METABOLIC PANEL
ANION GAP: 11 (ref 5–15)
BUN: 55 mg/dL — ABNORMAL HIGH (ref 6–20)
CALCIUM: 8.6 mg/dL — AB (ref 8.9–10.3)
CO2: 35 mmol/L — ABNORMAL HIGH (ref 22–32)
Chloride: 99 mmol/L — ABNORMAL LOW (ref 101–111)
Creatinine, Ser: 1.37 mg/dL — ABNORMAL HIGH (ref 0.44–1.00)
GFR, EST AFRICAN AMERICAN: 44 mL/min — AB (ref >60)
GFR, EST NON AFRICAN AMERICAN: 38 mL/min — AB (ref >60)
GLUCOSE: 56 mg/dL — AB (ref 65–99)
POTASSIUM: 3.4 mmol/L — AB (ref 3.5–5.1)
Sodium: 145 mmol/L (ref 135–145)

## 2017-05-02 LAB — BRAIN NATRIURETIC PEPTIDE: B NATRIURETIC PEPTIDE 5: 100 pg/mL (ref 0.0–100.0)

## 2017-05-02 LAB — PHOSPHORUS: PHOSPHORUS: 3.8 mg/dL (ref 2.5–4.6)

## 2017-05-02 LAB — MAGNESIUM: MAGNESIUM: 2.2 mg/dL (ref 1.7–2.4)

## 2017-05-02 MED ORDER — INSULIN GLARGINE 100 UNIT/ML ~~LOC~~ SOLN
30.0000 [IU] | Freq: Every day | SUBCUTANEOUS | Status: DC
  Administered 2017-05-02 – 2017-05-04 (×3): 30 [IU] via SUBCUTANEOUS
  Filled 2017-05-02 (×4): qty 0.3

## 2017-05-02 MED ORDER — IPRATROPIUM-ALBUTEROL 0.5-2.5 (3) MG/3ML IN SOLN: 3.0000 mL | RESPIRATORY_TRACT | Status: DC | PRN

## 2017-05-02 MED ORDER — POTASSIUM CHLORIDE CRYS ER 20 MEQ PO TBCR
40.0000 meq | EXTENDED_RELEASE_TABLET | Freq: Once | ORAL | Status: AC
Start: 2017-05-02 — End: 2017-05-02
  Administered 2017-05-02: 40 meq via ORAL
  Filled 2017-05-02: qty 2

## 2017-05-02 NOTE — Progress Notes (Signed)
PULMONARY / CRITICAL CARE MEDICINE   Name: Veronica Anderson MRN: [id number] DOB: [birth year]    ADMISSION DATE:  04/19/2017  PT PROFILE: 29 F adm 10/07 to hospitalist service with diagnosis of acute on chronic diastolic congestive heart failure, diabetes, stage III CKD. Admitted to SDU requiring BiPAP. Seen in consultation by PCCM at that time. Underwent intubation 04/23/17 for worsening respiratory failure  MAJOR EVENTS/TEST RESULTS: 10/07 Admission 10/07 PCCM consultation 10/12 nephrology consultation 10/12 CT Chest: Patchy left lower lobe opacity, suspicious for pneumonia. Additional mild patchy opacity in the posterior right upper lobe and right middle lobe, atelectasis versus pneumonia. Moderate bilateral pleural effusions 10/13 transfused 2 units PRBCs for hemoglobin 6.8, 6.6 10/15 gastroenterology consultation - no endoscopy planned 10/17 Passed SBT and extubated. Post-extubation stridor > BiPAP, methylprednisolone. Edema pattern on CXR > furosemide 10/18 continues to require intermittent BiPAP. Overall improved 10/19 remains off Bipap and tolerating well  10/20 Transfer to [place]:: ETT 10/11>>10/17 R IJ CVL 10/11 >> 10/20  MICRO DATA: MRSA PCR 10/07>>POS  ANTIMICROBIALS:  Vanc 10/14 >>10/15 Pip-tazo 10/14 >>10/15  SUBJECTIVE:  Pt remains off BiPAP. NAD. No new complaints  VITAL SIGNS: BP 111/60   Pulse 68   Temp 98.2 F (36.8 C)   Resp 17   Ht 5\' 5"  (1.651 m)   Wt 134.8 kg (297 lb 2.9 oz)   SpO2 100%   BMI 49.45 kg/m   HEMODYNAMICS:    VENTILATOR SETTINGS: FiO2 (%):  [30 %] 30 %  INTAKE / OUTPUT: I/O last 3 completed shifts: In: 4098 [P.O.:1400; I.V.:3] Out: 1600 [Urine:1600]  PHYSICAL EXAMINATION: General: NAD Neuro: no focal deficits, cognition intact HEENT: NCAT, sclerae white Cardiovascular: Reg, no M Lungs: no stridor, no adventitious sounds Abdomen: obese, soft, +BS x4 Extremities: trace bilateral lower extremity  edema  LABS:  BMET  Recent Labs Lab 04/30/17 0418 05/01/17 0350 05/02/17 0559  NA 144 146* 145  K 4.1 3.7 3.4*  CL 100* 103 99*  CO2 33* 34* 35*  BUN 49* 53* 55*  CREATININE 1.41* 1.37* 1.37*  GLUCOSE 152* 97 56*    Electrolytes  Recent Labs Lab 04/26/17 1058  04/30/17 0418 05/01/17 0350 05/02/17 0559  CALCIUM 7.8*  < > 9.0 9.0 8.6*  MG 2.1  --   --   --  2.2  PHOS 3.0  --   --   --  3.8  < > = values in this interval not displayed.  CBC  Recent Labs Lab 04/30/17 0418 05/01/17 0350 05/02/17 0559  WBC 11.3* 10.6 7.3  HGB 8.9* 8.8* 8.6*  HCT 28.9* 28.0* 27.4*  PLT 383 443* 429    Coag's No results for input(s): APTT, INR in the last 168 hours.  Sepsis Markers No results for input(s): LATICACIDVEN, PROCALCITON, O2SATVEN in the last 168 hours.  ABG No results for input(s): PHART, PCO2ART, PO2ART in the last 168 hours.  Liver Enzymes  Recent Labs Lab 04/28/17 0340  AST 23  ALT 15  ALKPHOS 67  BILITOT 0.7  ALBUMIN 2.0*    Cardiac Enzymes No results for input(s): TROPONINI, PROBNP in the last 168 hours.  Glucose  Recent Labs Lab 05/01/17 0348 05/01/17 0751 05/01/17 1128 05/01/17 1712 05/01/17 2226 05/02/17 0800  GLUCAP 90 103* 174* 110* 107* 54*   No new CXR  ASSESSMENT / PLAN: Acute on chronic due to OHS/OSA Post extubation stridor - resolved Pulmonary edema-improving   HFpEF Hypertension, controlled CKD AKI - resolved Hypernatremia-resolving  Hypokalemia-recurrent Acute on  chronic microcytic blood loss anemia  DM 2, controlled Transient hypoglycemia AM 10/20 Acute encephalopathy - resolved P:   Supplemental O2 PRN to maintain O2 sats >92% Mandatory nocturnal BiPAP  Continue current hypertension regimen - clonidine, hydralazine, amlodipine Continue home dose of furosemide Correct electorlytes Advance activity - PT consult has been placed Avoid chemical DVT prophy due to concern for chronic blood loss Continue Fe SO4  supplement Gastroenterology has seen - they might want to re-eval prior to DC to assess microcytic anemia Continue SSI  Lantus dose decreased 10/20 Transfer to [place] floor  After transfer, PCCM will sign off. Please call if we can be of further assistance    Merton Border, MD PCCM service Mobile [phone] Pager [phone] 05/02/2017 11:35 AM

## 2017-05-02 NOTE — Progress Notes (Signed)
[facility]  ROUNDING NOTE   Subjective:   Family at bedside.   Furosemide 40mg  PO daily.  UOP 1075  Objective:  Vital signs in last 24 hours:  Temp:  [98 F (36.7 C)-98.6 F (37 C)] 98.6 F (37 C) (10/20 0200) Pulse Rate:  [64-83] 64 (10/20 0700) Resp:  [9-20] 18 (10/20 0700) BP: (119-160)/(46-125) 141/65 (10/20 0600) SpO2:  [96 %-100 %] 100 % (10/20 0700) FiO2 (%):  [30 %] 30 % (10/20 0322) Weight:  [134.8 kg (297 lb 2.9 oz)] 134.8 kg (297 lb 2.9 oz) (10/20 0600)  Weight change: 3.1 kg (6 lb 13.4 oz) Filed Weights   04/30/17 0432 05/01/17 0544 05/02/17 0600  Weight: 135.4 kg (298 lb 8.1 oz) 131.7 kg (290 lb 5.5 oz) 134.8 kg (297 lb 2.9 oz)    Intake/Output: I/O last 3 completed shifts: In: 8295 [P.O.:1400; I.V.:3] Out: 1600 [Urine:1600]   Intake/Output this shift:  No intake/output data recorded.  Physical Exam: General: Ill appearing  Head: [place]   Eyes: Anicteric  Neck: Right IJ central line  Lungs:  clear  Heart: regular  Abdomen:  Soft, nontender   Extremities: No peripheral and dependent edema.  Neurologic: awake  Skin: No lesions       Basic Metabolic Panel:  Recent Labs Lab 04/25/17 0849  04/26/17 1058  04/28/17 0340 04/29/17 0302 04/30/17 0418 05/01/17 0350 05/02/17 0559  NA 144  < > 146*  < > 145 146* 144 146* 145  K 4.1  < > 3.8  < > 3.2* 3.3* 4.1 3.7 3.4*  CL 97*  < > 98*  < > 102 102 100* 103 99*  CO2 36*  < > 37*  < > 34* 33* 33* 34* 35*  GLUCOSE 226*  < > 182*  < > 207* 168* 152* 97 56*  BUN 44*  < > 63*  < > 57* 52* 49* 53* 55*  CREATININE 2.23*  < > 2.08*  < > 1.53* 1.50* 1.41* 1.37* 1.37*  CALCIUM 7.7*  < > 7.8*  < > 7.8* 8.7* 9.0 9.0 8.6*  MG  --   --  2.1  --   --   --   --   --  2.2  PHOS 3.8  --  3.0  --   --   --   --   --  3.8  < > = values in this interval not displayed.  Liver Function Tests:  Recent Labs Lab 04/28/17 0340  AST 23  ALT 15  ALKPHOS 67  BILITOT 0.7  PROT 6.6  ALBUMIN 2.0*   No  results for input(s): LIPASE, AMYLASE in the last 168 hours. No results for input(s): AMMONIA in the last 168 hours.  CBC:  Recent Labs Lab 04/26/17 1058  04/28/17 0340 04/29/17 0302 04/30/17 0418 05/01/17 0350 05/02/17 0559  WBC 11.6*  < > 11.1* 9.9 11.3* 10.6 7.3  NEUTROABS 9.1*  --   --   --   --   --   --   HGB 6.8*  < > 7.2* 7.4* 8.9* 8.8* 8.6*  HCT 22.8*  < > 23.5* 23.9* 28.9* 28.0* 27.4*  MCV 73.3*  < > 74.2* 74.3* 74.3* 73.6* 72.8*  PLT 221  < > 258 307 383 443* 429  < > = values in this interval not displayed.  Cardiac Enzymes: No results for input(s): CKTOTAL, CKMB, CKMBINDEX, TROPONINI in the last 168 hours.  BNP: Invalid input(s): POCBNP  CBG:  Recent  Labs Lab 05/01/17 0751 05/01/17 1128 05/01/17 1712 05/01/17 2226 05/02/17 0800  GLUCAP 103* 174* 110* 107* 28*    Microbiology: Results for orders placed or performed during the hospital encounter of 04/19/17  MRSA PCR Screening     Status: Abnormal   Collection Time: 04/19/17 10:07 AM  Result Value Ref Range Status   MRSA by PCR POSITIVE (A) NEGATIVE Final    Comment:        The GeneXpert MRSA Assay (FDA approved for NASAL specimens only), is one component of a comprehensive MRSA colonization surveillance program. It is not intended to diagnose MRSA infection nor to guide or monitor treatment for MRSA infections. RESULT CALLED TO, READ BACK BY AND VERIFIED WITH: SANDRA BOREA ON 04/19/17 AT 1228 [facility]     Coagulation Studies: No results for input(s): LABPROT, INR in the last 72 hours.  Urinalysis: No results for input(s): COLORURINE, LABSPEC, PHURINE, GLUCOSEU, HGBUR, BILIRUBINUR, KETONESUR, PROTEINUR, UROBILINOGEN, NITRITE, LEUKOCYTESUR in the last 72 hours.  Invalid input(s): APPERANCEUR    Imaging: Dg Chest Port 1 View  Result Date: 05/01/2017 CLINICAL DATA:  71 year old female with history of respiratory failure. EXAM: PORTABLE CHEST 1 VIEW COMPARISON:  Chest x-ray 04/30/2017.  FINDINGS: Right internal jugular single-lumen porta cath with tip terminating at the superior cavoatrial junction. Lung volumes are low. No acute consolidative airspace disease. No pleural effusions. Mild crowding of the pulmonary vasculature, related at low lung volumes, without frank pulmonary edema. Mild cardiomegaly. The patient is rotated to the right on today's exam, resulting in distortion of the mediastinal contours and reduced diagnostic sensitivity and specificity for mediastinal pathology. IMPRESSION: 1. Low lung volumes without radiographic evidence of acute cardiopulmonary disease. 2. Mild cardiomegaly. Electronically Signed   By: Vinnie Langton M.D.   On: 05/01/2017 07:13     Medications:    . amLODipine  10 mg Oral Daily  . budesonide (PULMICORT) nebulizer solution  0.25 mg Nebulization Q6H  . chlorhexidine  15 mL Mouth Rinse BID  . cloNIDine  0.3 mg Transdermal Weekly  . ferrous sulfate  325 mg Oral BID WC  . furosemide  40 mg Oral Daily  . gabapentin  200 mg Oral TID  . hydrALAZINE  50 mg Oral TID  . insulin aspart  0-15 Units Subcutaneous TID WC  . insulin aspart  0-5 Units Subcutaneous QHS  . insulin aspart  4 Units Subcutaneous TID WC  . insulin glargine  40 Units Subcutaneous Daily  . ipratropium-albuterol  3 mL Nebulization Q6H  . mouth rinse  15 mL Mouth Rinse q12n4p  . multivitamin with minerals  1 tablet Oral Daily  . protein supplement shake  11 oz Oral TID BM  . sennosides  5 mL Per Tube BID  . sodium chloride flush  10-40 mL Intracatheter Q12H  . sodium chloride flush  3 mL Intravenous Q12H   acetaminophen **OR** [DISCONTINUED] acetaminophen, hydrALAZINE, metoprolol tartrate, ondansetron (ZOFRAN) IV, polyethylene glycol, sodium chloride flush  Assessment/ Plan:  71 y.o.black female with anemia, congestive heart failure, chronic kidney disease stage III, diabetes mellitus type 2, peripheral edema, GERD, hypertension, hyperlipidemia, peripheral neuropathy,  obesity, history of CVA, obstructive sleep apnea  1. Acute renal failure on chronic kidney disease stage III Baseline creatinine of 1.65, GFR of 35 on 04/24/17. No urine studies available.  Chronic kidney disease secondary to diabetes, hypertension.  Acute renal failure secondary to overdiuresis - furosemide 40mg  PO daily.   2. Hypernatremia: from free water deficit from overdiuresis. Sodium back to  baseline.    3. Anemia with renal failure/chronic kidney disease: status post PRBC transfusion on 10/13 and 10/14.    LOS: [place], Thedore Pickel 10/20/20188:21 AM

## 2017-05-02 NOTE — Progress Notes (Signed)
[facility] [place] NAME: Veronica Anderson    MR#:  [id number]  DATE OF BIRTH:  30-Oct-1945  SUBJECTIVE:  CHIEF COMPLAINT:   Chief Complaint  Patient presents with  . Respiratory Distress   -Continues to improve clinically, using BiPAP at night times and nasal cannula during the day. -Physically  still weak  REVIEW OF SYSTEMS:  Review of Systems  Constitutional: Positive for malaise/fatigue. Negative for chills and fever.  HENT: Negative for congestion, ear discharge, hearing loss and nosebleeds.   Eyes: Negative for blurred vision and double vision.  Respiratory: Positive for cough and shortness of breath. Negative for wheezing.   Cardiovascular: Negative for chest pain, palpitations, claudication and leg swelling.  Gastrointestinal: Negative for abdominal pain, constipation, diarrhea, nausea and vomiting.  Genitourinary: Negative for dysuria and urgency.  Musculoskeletal: Negative for myalgias.  Neurological: Negative for dizziness, sensory change, speech change, focal weakness, seizures and headaches.  Psychiatric/Behavioral: Negative for depression.    DRUG ALLERGIES:   Allergies  Allergen Reactions  . Ace Inhibitors Cough    VITALS:  Blood pressure 111/60, pulse 68, temperature 98.2 F (36.8 C), resp. rate 17, height 5\' 5"  (1.651 m), weight 134.8 kg (297 lb 2.9 oz), SpO2 100 %.  PHYSICAL EXAMINATION:  Physical Exam  GENERAL:  71 y.o.-year-old obese patient lying in the bed with no acute distress.  EYES: Pupils equal, round, reactive to light and accommodation. No scleral icterus. Extraocular muscles intact.  HEENT: Head atraumatic, normocephalic. Oropharynx and nasopharynx clear.  NECK:  Supple, no jugular venous distention. No thyroid enlargement, no tenderness.  LUNGS: Normal breath sounds,, scattered wheezing, , no rales,rhonchi or crepitation. No use of accessory muscles of respiration. Decreased at the bases No  stridor today CARDIOVASCULAR: S1, S2 normal. No murmurs, rubs, or gallops.  ABDOMEN: Soft, nontender, nondistended. Bowel sounds present. No organomegaly or mass.  EXTREMITIES: No pedal edema, cyanosis, or clubbing.  NEUROLOGIC: Cranial nerves II through XII are intact. Muscle strength 5/5 in all extremities. Sensation intact. Gait not checked.  PSYCHIATRIC: The patient is alert and oriented x 3.  SKIN: No obvious rash, lesion, or ulcer.    LABORATORY PANEL:   CBC  Recent Labs Lab 05/02/17 0559  WBC 7.3  HGB 8.6*  HCT 27.4*  PLT 429   ------------------------------------------------------------------------------------------------------------------  Chemistries   Recent Labs Lab 04/28/17 0340  05/02/17 0559  NA 145  < > 145  K 3.2*  < > 3.4*  CL 102  < > 99*  CO2 34*  < > 35*  GLUCOSE 207*  < > 56*  BUN 57*  < > 55*  CREATININE 1.53*  < > 1.37*  CALCIUM 7.8*  < > 8.6*  MG  --   --  2.2  AST 23  --   --   ALT 15  --   --   ALKPHOS 67  --   --   BILITOT 0.7  --   --   < > = values in this interval not displayed. ------------------------------------------------------------------------------------------------------------------  Cardiac Enzymes No results for input(s): TROPONINI in the last 168 hours. ------------------------------------------------------------------------------------------------------------------  RADIOLOGY:  Dg Chest Port 1 View  Result Date: 05/01/2017 CLINICAL DATA:  71 year old female with history of respiratory failure. EXAM: PORTABLE CHEST 1 VIEW COMPARISON:  Chest x-ray 04/30/2017. FINDINGS: Right internal jugular single-lumen porta cath with tip terminating at the superior cavoatrial junction. Lung volumes are low. No acute consolidative airspace disease. No pleural effusions. Mild crowding  of the pulmonary vasculature, related at low lung volumes, without frank pulmonary edema. Mild cardiomegaly. The patient is rotated to the right on today's  exam, resulting in distortion of the mediastinal contours and reduced diagnostic sensitivity and specificity for mediastinal pathology. IMPRESSION: 1. Low lung volumes without radiographic evidence of acute cardiopulmonary disease. 2. Mild cardiomegaly. Electronically Signed   By: Vinnie Langton M.D.   On: 05/01/2017 07:13    EKG:   Orders placed or performed during the hospital encounter of 04/19/17  . ED EKG  . ED EKG  . EKG 12-Lead  . EKG 12-Lead  . EKG  . EKG 12-Lead  . EKG 12-Lead    ASSESSMENT AND PLAN:    71 year old female, morbidly obese with past medical history significant for chronic diastolic heart failure, hypertension, diabetes and CK D stage III presents to hospital secondary to worsening shortness of breath.  * Acute on chronic hypoxic respiratory failure - Continue to acute on chronic diastolic CHF exacerbation. - failed Bipap initially, intubated and extubated on 04/29/17- now on Bipap at nights and nasal cannula during day - wean as tolerated- management per pulmonary team -At home she is on 2L oxygen and Cpap at bedtime -Continue oral daily lasix now.  - continue inhalers and neb -Monitor input and output - Echocardiogram with diastolic dysfunction  * Hypertension.  Lasix, , hydralazine, norvasc and clonidine  * Insulin-dependent diabetes mellitus. ON lantus. Sliding scale insulin. - Sugars are slightly low this morning. Monitor  * CKD stage III is stable.  Monitor while on diuretics Appreciate nephrology consult  * Anemia of chronic disease- stable hb, monitor Received Tx this adm On iron supplements  * DVT prophylaxis- On TEDs and SCDs Not on heparin products due to anemia requiring transfusion this admission  Sons at bedside.  Anticipate transfer out of ICU today if stable    All the records are reviewed and case discussed with Care Management/Social Workerr. Management plans discussed with the patient, family and they are in  agreement.  CODE STATUS:Full Code  TOTAL TIME TAKING CARE OF THIS PATIENT: 36 minutes.   POSSIBLE D/C IN 2-3 DAYS, DEPENDING ON CLINICAL CONDITION.   Trampas Stettner M.D on 05/02/2017 at 11:37 AM  Between 7am to 6pm - Pager - [phone]  After 6pm go to [email or web address] - password [email or web address] [facility]  Office  [phone]  CC: Primary care physician; Hermelinda Dellen, DO

## 2017-05-02 NOTE — Progress Notes (Signed)
Patient alert and responsive with family at bedside.  Meds taken PO without difficulty.  PRN pain med given x 1 for headache with positive effect.  Tolerated chest therapy.  Makes needs known.  BiPap at HS.  No acute concerns at this time.

## 2017-05-02 NOTE — Progress Notes (Signed)
Morning insulin held due to pt's cbg being 54.  Juice given along with breakfast tray.  Pt asymptomatic. Bubba Camp, RN

## 2017-05-03 LAB — CBC
HEMATOCRIT: 27.9 % — AB (ref 35.0–47.0)
HEMOGLOBIN: 8.6 g/dL — AB (ref 12.0–16.0)
MCH: 22.4 pg — ABNORMAL LOW (ref 26.0–34.0)
MCHC: 30.9 g/dL — ABNORMAL LOW (ref 32.0–36.0)
MCV: 72.6 fL — AB (ref 80.0–100.0)
Platelets: 465 10*3/uL — ABNORMAL HIGH (ref 150–440)
RBC: 3.84 MIL/uL (ref 3.80–5.20)
RDW: 20.9 % — AB (ref 11.5–14.5)
WBC: 7 10*3/uL (ref 3.6–11.0)

## 2017-05-03 LAB — GLUCOSE, CAPILLARY
GLUCOSE-CAPILLARY: 110 mg/dL — AB (ref 65–99)
GLUCOSE-CAPILLARY: 172 mg/dL — AB (ref 65–99)
GLUCOSE-CAPILLARY: 195 mg/dL — AB (ref 65–99)
Glucose-Capillary: 131 mg/dL — ABNORMAL HIGH (ref 65–99)

## 2017-05-03 LAB — BASIC METABOLIC PANEL
ANION GAP: 10 (ref 5–15)
BUN: 54 mg/dL — ABNORMAL HIGH (ref 6–20)
CALCIUM: 8.6 mg/dL — AB (ref 8.9–10.3)
CO2: 33 mmol/L — AB (ref 22–32)
Chloride: 97 mmol/L — ABNORMAL LOW (ref 101–111)
Creatinine, Ser: 1.44 mg/dL — ABNORMAL HIGH (ref 0.44–1.00)
GFR calc non Af Amer: 36 mL/min — ABNORMAL LOW (ref >60)
GFR, EST AFRICAN AMERICAN: 41 mL/min — AB (ref >60)
GLUCOSE: 107 mg/dL — AB (ref 65–99)
POTASSIUM: 4.1 mmol/L (ref 3.5–5.1)
Sodium: 140 mmol/L (ref 135–145)

## 2017-05-03 NOTE — Progress Notes (Signed)
Patient transferred to room 255.  All belongings and medications and medical chart given to Moshe Salisbury, Therapist, sports, Camera operator.  Son at bedside with patient.  No acute concerns.

## 2017-05-03 NOTE — Progress Notes (Signed)
[facility] at [place] NAME: Veronica Anderson    MR#:  [id number]  DATE OF BIRTH:  05-22-1946  SUBJECTIVE:  CHIEF COMPLAINT:   Chief Complaint  Patient presents with  . Respiratory Distress   -feels much better. Resting on BiPAP at nighttime and on nasal cannula -physically very weak still- family not interested in going to rehabilitation  REVIEW OF SYSTEMS:  Review of Systems  Constitutional: Positive for malaise/fatigue. Negative for chills and fever.  HENT: Negative for congestion, ear discharge, hearing loss and nosebleeds.   Eyes: Negative for blurred vision and double vision.  Respiratory: Positive for cough and shortness of breath. Negative for wheezing.   Cardiovascular: Negative for chest pain, palpitations, claudication and leg swelling.  Gastrointestinal: Negative for abdominal pain, constipation, diarrhea, nausea and vomiting.  Genitourinary: Negative for dysuria and urgency.  Musculoskeletal: Negative for myalgias.  Neurological: Negative for dizziness, sensory change, speech change, focal weakness, seizures and headaches.  Psychiatric/Behavioral: Negative for depression.    DRUG ALLERGIES:   Allergies  Allergen Reactions  . Ace Inhibitors Cough    VITALS:  Blood pressure (!) 155/63, pulse 66, temperature 98.1 F (36.7 C), temperature source Oral, resp. rate 18, height 5\' 5"  (1.651 m), weight 113.1 kg (249 lb 6.4 oz), SpO2 100 %.  PHYSICAL EXAMINATION:  Physical Exam  GENERAL:  71 y.o.-year-old obese patient lying in the bed with no acute distress.  EYES: Pupils equal, round, reactive to light and accommodation. No scleral icterus. Extraocular muscles intact.  HEENT: Head atraumatic, normocephalic. Oropharynx and nasopharynx clear.  NECK:  Supple, no jugular venous distention. No thyroid enlargement, no tenderness.  LUNGS: Normal breath sounds,, scattered wheezing, , no rales,rhonchi or crepitation. No use of  accessory muscles of respiration. Decreased at the bases No stridor today CARDIOVASCULAR: S1, S2 normal. No murmurs, rubs, or gallops.  ABDOMEN: Soft, nontender, nondistended. Bowel sounds present. No organomegaly or mass.  EXTREMITIES: No pedal edema, cyanosis, or clubbing.  NEUROLOGIC: Cranial nerves II through XII are intact. Muscle strength 5/5 in all extremities. Sensation intact. Gait not checked. Globally very weak PSYCHIATRIC: The patient is alert and oriented x 3.  SKIN: No obvious rash, lesion, or ulcer.    LABORATORY PANEL:   CBC  Recent Labs Lab 05/03/17 0458  WBC 7.0  HGB 8.6*  HCT 27.9*  PLT 465*   ------------------------------------------------------------------------------------------------------------------  Chemistries   Recent Labs Lab 04/28/17 0340  05/02/17 0559 05/03/17 0458  NA 145  < > 145 140  K 3.2*  < > 3.4* 4.1  CL 102  < > 99* 97*  CO2 34*  < > 35* 33*  GLUCOSE 207*  < > 56* 107*  BUN 57*  < > 55* 54*  CREATININE 1.53*  < > 1.37* 1.44*  CALCIUM 7.8*  < > 8.6* 8.6*  MG  --   --  2.2  --   AST 23  --   --   --   ALT 15  --   --   --   ALKPHOS 67  --   --   --   BILITOT 0.7  --   --   --   < > = values in this interval not displayed. ------------------------------------------------------------------------------------------------------------------  Cardiac Enzymes No results for input(s): TROPONINI in the last 168 hours. ------------------------------------------------------------------------------------------------------------------  RADIOLOGY:  No results found.  EKG:   Orders placed or performed during the hospital encounter of 04/19/17  . ED EKG  .  ED EKG  . EKG 12-Lead  . EKG 12-Lead  . EKG  . EKG 12-Lead  . EKG 12-Lead    ASSESSMENT AND PLAN:    71 year old female, morbidly obese with past medical history significant for chronic diastolic heart failure, hypertension, diabetes and CK D stage III presents to hospital  secondary to worsening shortness of breath.  * Acute on chronic hypoxic respiratory failure - Continue to acute on chronic diastolic CHF exacerbation. - failed Bipap initially, intubated and extubated on 04/29/17- now on Bipap at nights and nasal cannula during day - At home she is on 2L oxygen and Cpap at bedtime - Continue oral daily lasix now.  - continue inhalers and neb - Monitor input and output - Echocardiogram with diastolic dysfunction  * Hypertension.  Lasix, , hydralazine, norvasc and clonidine  * Insulin-dependent diabetes mellitus. ON lantus. Sliding scale insulin. - Sugars are slightly low this morning. Monitor  * CKD stage III is stable.  Monitor while on diuretics Appreciate nephrology consult  * Anemia of chronic disease- stable hb, monitor Received Tx this adm On iron supplements  * DVT prophylaxis- On TEDs and SCDs Not on heparin products due to anemia requiring transfusion this admission  Sons at bedside.  Physical Therapy today    All the records are reviewed and case discussed with Care Management/Social Workerr. Management plans discussed with the patient, family and they are in agreement.  CODE STATUS:Full Code  TOTAL TIME TAKING CARE OF THIS PATIENT: 34 minutes.   POSSIBLE D/C IN 2-3 DAYS, DEPENDING ON CLINICAL CONDITION.   Zollie Ellery M.D on 05/03/2017 at 11:07 AM  Between 7am to 6pm - Pager - [phone]  After 6pm go to [email or web address] - password [email or web address] [facility]  Office  [phone]  CC: Primary care physician; Hermelinda Dellen, DO

## 2017-05-04 LAB — GLUCOSE, CAPILLARY
GLUCOSE-CAPILLARY: 150 mg/dL — AB (ref 65–99)
Glucose-Capillary: 94 mg/dL (ref 65–99)
Glucose-Capillary: 147 mg/dL — ABNORMAL HIGH (ref 65–99)
Glucose-Capillary: 162 mg/dL — ABNORMAL HIGH (ref 65–99)

## 2017-05-04 NOTE — Care Management Important Message (Signed)
Important Message  Patient Details  Name: Veronica Anderson MRN: [id number] Date of Birth: 1946/04/18   Medicare Important Message Given:  Yes    Beverly Sessions, RN 05/04/2017, 2:26 PM

## 2017-05-04 NOTE — Progress Notes (Signed)
[facility] [place] NAME: Veronica Anderson    MR#:  [id number]  DATE OF BIRTH:  06/30/1946  SUBJECTIVE:  CHIEF COMPLAINT:   Chief Complaint  Patient presents with  . Respiratory Distress   - feels better, but very weak physically, refuses to go to rehab  REVIEW OF SYSTEMS:  Review of Systems  Constitutional: Positive for malaise/fatigue. Negative for chills and fever.  HENT: Negative for congestion, ear discharge, hearing loss and nosebleeds.   Eyes: Negative for blurred vision and double vision.  Respiratory: Positive for cough and shortness of breath. Negative for wheezing.   Cardiovascular: Negative for chest pain, palpitations, claudication and leg swelling.  Gastrointestinal: Negative for abdominal pain, constipation, diarrhea, nausea and vomiting.  Genitourinary: Negative for dysuria and urgency.  Musculoskeletal: Negative for myalgias.  Neurological: Negative for dizziness, sensory change, speech change, focal weakness, seizures and headaches.  Psychiatric/Behavioral: Negative for depression.    DRUG ALLERGIES:   Allergies  Allergen Reactions  . Ace Inhibitors Cough    VITALS:  Blood pressure (!) 154/46, pulse 63, temperature 98.2 F (36.8 C), temperature source Oral, resp. rate 19, height 5\' 5"  (1.651 m), weight 102.8 kg (226 lb 11.2 oz), SpO2 94 %.  PHYSICAL EXAMINATION:  Physical Exam  GENERAL:  71 y.o.-year-old obese patient lying in the bed with no acute distress.  EYES: Pupils equal, round, reactive to light and accommodation. No scleral icterus. Extraocular muscles intact.  HEENT: Head atraumatic, normocephalic. Oropharynx and nasopharynx clear.  NECK:  Supple, no jugular venous distention. No thyroid enlargement, no tenderness.  LUNGS: Normal breath sounds,, scattered wheezing, , no rales,rhonchi or crepitation. No use of accessory muscles of respiration. Decreased at the bases No stridor today CARDIOVASCULAR:  S1, S2 normal. No murmurs, rubs, or gallops.  ABDOMEN: Soft, nontender, nondistended. Bowel sounds present. No organomegaly or mass.  EXTREMITIES: No pedal edema, cyanosis, or clubbing.  NEUROLOGIC: Cranial nerves II through XII are intact. Muscle strength 5/5 in all extremities. Sensation intact. Gait not checked. Globally very weak PSYCHIATRIC: The patient is alert and oriented x 3.  SKIN: No obvious rash, lesion, or ulcer.    LABORATORY PANEL:   CBC  Recent Labs Lab 05/03/17 0458  WBC 7.0  HGB 8.6*  HCT 27.9*  PLT 465*   ------------------------------------------------------------------------------------------------------------------  Chemistries   Recent Labs Lab 04/28/17 0340  05/02/17 0559 05/03/17 0458  NA 145  < > 145 140  K 3.2*  < > 3.4* 4.1  CL 102  < > 99* 97*  CO2 34*  < > 35* 33*  GLUCOSE 207*  < > 56* 107*  BUN 57*  < > 55* 54*  CREATININE 1.53*  < > 1.37* 1.44*  CALCIUM 7.8*  < > 8.6* 8.6*  MG  --   --  2.2  --   AST 23  --   --   --   ALT 15  --   --   --   ALKPHOS 67  --   --   --   BILITOT 0.7  --   --   --   < > = values in this interval not displayed. ------------------------------------------------------------------------------------------------------------------  Cardiac Enzymes No results for input(s): TROPONINI in the last 168 hours. ------------------------------------------------------------------------------------------------------------------  RADIOLOGY:  No results found.  EKG:   Orders placed or performed during the hospital encounter of 04/19/17  . ED EKG  . ED EKG  . EKG 12-Lead  . EKG 12-Lead  .  EKG  . EKG 12-Lead  . EKG 12-Lead    ASSESSMENT AND PLAN:    71 year old female, morbidly obese with past medical history significant for chronic diastolic heart failure, hypertension, diabetes and CK D stage III presents to hospital secondary to worsening shortness of breath.  * Acute on chronic hypoxic respiratory  failure - Continue to acute on chronic diastolic CHF exacerbation. - failed Bipap initially, intubated and extubated on 04/29/17- now on Bipap at nights and nasal cannula during day- change to cpap at bedtime today - At home she is on 2L oxygen and Cpap at bedtime - Continue oral daily lasix now.  - continue inhalers and neb - Monitor input and output - Echocardiogram with diastolic dysfunction  * Hypertension.  Lasix, , hydralazine, norvasc and clonidine  * Insulin-dependent diabetes mellitus. ON lantus. Sliding scale insulin. -  Monitor  * CKD stage III is stable.  Monitor while on diuretics Appreciate nephrology consult  * Anemia of chronic disease- stable hb, monitor Received Tx this adm On iron supplements  * DVT prophylaxis- On TEDs and SCDs Not on heparin products due to anemia requiring transfusion this admission  Grand daughter at bedside.  Physical Therapy consulted.    All the records are reviewed and case discussed with Care Management/Social Workerr. Management plans discussed with the patient, family and they are in agreement.  CODE STATUS:Full Code  TOTAL TIME TAKING CARE OF THIS PATIENT: 33 minutes.   POSSIBLE D/C TOMORROW, DEPENDING ON CLINICAL CONDITION.   Gladstone Lighter M.D on 05/04/2017 at 10:37 AM  Between 7am to 6pm - Pager - [phone]  After 6pm go to [email or web address] - password [email or web address] [facility]  Office  [phone]  CC: Primary care physician; Hermelinda Dellen, DO

## 2017-05-04 NOTE — Care Management (Signed)
Followed up with [facility] and [facility] as the case had been sent to them for review.  Patient's status has improved and per both liaisons does not meet LTACH criteria.  PT has assessed patient and recommending SNF, CSW aware

## 2017-05-04 NOTE — Progress Notes (Signed)
Physical Therapy Treatment Patient Details Name: Veronica Anderson MRN: [id number] DOB: [birth year] Today's Date: 05/04/2017    History of Present Illness Pt is a 71 yo F admitted 04/19/17 to hospitalist service with diagnosis of acute on chronic diastolic congestive heart failure, diabetes, stage III CKD. Admitted to SDU requiring BiPAP. Seen in consultation by PCCM at that time. Underwent intubation 04/23/17 for worsening respiratory failure and extubated 04/29/17.  Assessment includes: acute respiratory failure with stridor, pulmonary edema, HTN, CKD III, AKI, acute blood loss anemia, and DM II with severe hyperglycemia.      PT Comments    Pt presents with deficits in strength, transfers, mobility, gait, balance, and activity tolerance.  Pt required motivation, extra time and effort, and min A for bed mobility tasks.  Pt encouraged for sitting time at EOB for improved activity tolerance and core strengthening with pt able to tolerate 3-5 min with SpO2 dropping from baseline of 98% on 1.5LO2/min to 96% with HR WNL and no c/o of adverse symptoms from patient other than fatigue.  Pt declined attempt for sit to/from stand transfer at EOB secondary to fatigue and fear of falling.  Pt will benefit from PT services in a SNF setting upon discharge to safely address above deficits for decreased caregiver assistance and eventual return to PLOF.      Follow Up Recommendations  SNF     Equipment Recommendations  Rolling walker with 5" wheels;Other (comment) (Bariatric RW)    Recommendations for Other Services       Precautions / Restrictions Precautions Precautions: Fall Restrictions Weight Bearing Restrictions: No    Mobility  Bed Mobility Overal bed mobility: Needs Assistance Bed Mobility: Supine to Sit;Sit to Supine     Supine to sit: Min assist Sit to supine: Min assist   General bed mobility comments: Min A for BLE in/out of bed and for upright sitting  Transfers                  General transfer comment: Pt declined transfer/amb attempt this session  Ambulation/Gait             General Gait Details: Pt declined transfer/amb attempt this session   Stairs            Wheelchair Mobility    Modified Rankin (Stroke Patients Only)       Balance                                            Cognition Arousal/Alertness: Awake/alert Behavior During Therapy: WFL for tasks assessed/performed Overall Cognitive Status: Within Functional Limits for tasks assessed                                 General Comments: Pt much more alert and conversive this session      Exercises Total Joint Exercises Ankle Circles/Pumps: Strengthening;Both;5 reps;10 reps Quad Sets: Strengthening;Both;5 reps;10 reps Gluteal Sets: Strengthening;Both;10 reps Heel Slides: AAROM;Both;10 reps Hip ABduction/ADduction: AAROM;Both;5 reps Long Arc Quad: AROM;Both;5 reps Knee Flexion: AROM;Both;5 reps Other Exercises Other Exercises: HEP education with pt and family to attempt BLE APs, GS, and QS x 5-10 reps every 1-2 hours as tolerated by pt.      General Comments        Pertinent Vitals/Pain Pain Assessment: 0-10 Pain Score: 7  Pain  Location: L knee Pain Descriptors / Indicators: Sore Pain Intervention(s): RN gave pain meds during session;Monitored during session;Limited activity within patient's tolerance    Home Living                      Prior Function            PT Goals (current goals can now be found in the care plan section) Progress towards PT goals: Progressing toward goals    Frequency    Min 2X/week      PT Plan Current plan remains appropriate    Co-evaluation              AM-PAC PT "6 Clicks" Daily Activity  Outcome Measure                   End of Session Equipment Utilized During Treatment: Oxygen Activity Tolerance: Patient limited by fatigue Patient left: in bed;with bed  alarm set with all bed rails up per patient request Nurse Communication: Mobility status;Other (comment);Patient requests pain meds (Pt's pain in L knee) PT Visit Diagnosis: Muscle weakness (generalized) (M62.81);Difficulty in walking, not elsewhere classified (R26.2)     Time: 1040-1107 PT Time Calculation (min) (ACUTE ONLY): 27 min  Charges:  $Therapeutic Exercise: 8-22 mins $Therapeutic Activity: 8-22 mins                    G Codes:       DRoyetta Asal PT, DPT 05/04/17, 11:46 AM

## 2017-05-04 NOTE — Progress Notes (Signed)
[facility]  ROUNDING NOTE   Subjective:   Family at bedside.  Patient states she is doing well Serum creatinine now stable at 1.4/GFR 41 States she is able to eat without nausea or vomiting   Objective:  Vital signs in last 24 hours:  Temp:  [98.2 F (36.8 C)] 98.2 F (36.8 C) (10/21 2025) Pulse Rate:  [59-76] 76 (10/22 1134) Resp:  [18-19] 19 (10/22 0956) BP: (132-154)/(40-55) 154/46 (10/22 0956) SpO2:  [94 %-100 %] 98 % (10/22 1134) Weight:  [102.8 kg (226 lb 11.2 oz)] 102.8 kg (226 lb 11.2 oz) (10/22 0512)  Weight change: -32.269 kg (-71 lb 2.3 oz) Filed Weights   05/03/17 0600 05/03/17 0639 05/04/17 0512  Weight: 135.1 kg (297 lb 13.5 oz) 113.1 kg (249 lb 6.4 oz) 102.8 kg (226 lb 11.2 oz)    Intake/Output: I/O last 3 completed shifts: In: 240 [P.O.:240] Out: 575 [Urine:575]   Intake/Output this shift:  Total I/O In: -  Out: 1000 [Urine:1000]  Physical Exam: General: Ill appearing  Head: [place]   Eyes: Anicteric  Neck: Right IJ central line  Lungs:  clear  Heart: regular  Abdomen:  Soft, nontender   Extremities: No peripheral and dependent edema.  Neurologic: awake, able to follow commands  Skin: No lesions       Basic Metabolic Panel:  Recent Labs Lab 04/29/17 0302 04/30/17 0418 05/01/17 0350 05/02/17 0559 05/03/17 0458  NA 146* 144 146* 145 140  K 3.3* 4.1 3.7 3.4* 4.1  CL 102 100* 103 99* 97*  CO2 33* 33* 34* 35* 33*  GLUCOSE 168* 152* 97 56* 107*  BUN 52* 49* 53* 55* 54*  CREATININE 1.50* 1.41* 1.37* 1.37* 1.44*  CALCIUM 8.7* 9.0 9.0 8.6* 8.6*  MG  --   --   --  2.2  --   PHOS  --   --   --  3.8  --     Liver Function Tests:  Recent Labs Lab 04/28/17 0340  AST 23  ALT 15  ALKPHOS 67  BILITOT 0.7  PROT 6.6  ALBUMIN 2.0*   No results for input(s): LIPASE, AMYLASE in the last 168 hours. No results for input(s): AMMONIA in the last 168 hours.  CBC:  Recent Labs Lab 04/29/17 0302 04/30/17 0418 05/01/17 0350  05/02/17 0559 05/03/17 0458  WBC 9.9 11.3* 10.6 7.3 7.0  HGB 7.4* 8.9* 8.8* 8.6* 8.6*  HCT 23.9* 28.9* 28.0* 27.4* 27.9*  MCV 74.3* 74.3* 73.6* 72.8* 72.6*  PLT 307 383 443* 429 465*    Cardiac Enzymes: No results for input(s): CKTOTAL, CKMB, CKMBINDEX, TROPONINI in the last 168 hours.  BNP: Invalid input(s): POCBNP  CBG:  Recent Labs Lab 05/03/17 1116 05/03/17 1639 05/03/17 2054 05/04/17 0811 05/04/17 1151  GLUCAP 172* 195* 131* 12 150*    Microbiology: Results for orders placed or performed during the hospital encounter of 04/19/17  MRSA PCR Screening     Status: Abnormal   Collection Time: 04/19/17 10:07 AM  Result Value Ref Range Status   MRSA by PCR POSITIVE (A) NEGATIVE Final    Comment:        The GeneXpert MRSA Assay (FDA approved for NASAL specimens only), is one component of a comprehensive MRSA colonization surveillance program. It is not intended to diagnose MRSA infection nor to guide or monitor treatment for MRSA infections. RESULT CALLED TO, READ BACK BY AND VERIFIED WITH: SANDRA BOREA ON 04/19/17 AT 1228 [facility]     Coagulation Studies:  No results for input(s): LABPROT, INR in the last 72 hours.  Urinalysis: No results for input(s): COLORURINE, LABSPEC, PHURINE, GLUCOSEU, HGBUR, BILIRUBINUR, KETONESUR, PROTEINUR, UROBILINOGEN, NITRITE, LEUKOCYTESUR in the last 72 hours.  Invalid input(s): APPERANCEUR    Imaging: No results found.   Medications:    . amLODipine  10 mg Oral Daily  . chlorhexidine  15 mL Mouth Rinse BID  . cloNIDine  0.3 mg Transdermal Weekly  . ferrous sulfate  325 mg Oral BID WC  . furosemide  40 mg Oral Daily  . gabapentin  200 mg Oral TID  . hydrALAZINE  50 mg Oral TID  . insulin aspart  0-15 Units Subcutaneous TID WC  . insulin aspart  0-5 Units Subcutaneous QHS  . insulin glargine  30 Units Subcutaneous Daily  . multivitamin with minerals  1 tablet Oral Daily  . protein supplement shake  11 oz Oral TID BM  .  sennosides  5 mL Per Tube BID  . sodium chloride flush  10-40 mL Intracatheter Q12H  . sodium chloride flush  3 mL Intravenous Q12H   acetaminophen **OR** [DISCONTINUED] acetaminophen, ipratropium-albuterol, ondansetron (ZOFRAN) IV, polyethylene glycol, sodium chloride flush  Assessment/ Plan:  71 y.o.black female with anemia, congestive heart failure, chronic kidney disease stage III, diabetes mellitus type 2, peripheral edema, GERD, hypertension, hyperlipidemia, peripheral neuropathy, obesity, history of CVA, obstructive sleep apnea  1. Acute renal failure on chronic kidney disease stage III  Chronic kidney disease secondary to diabetes, hypertension.  Patient has likely established new baseline with creatinine 1.4/GFR 41 Urine protein to creatinine ratio 0.99 Follow-up outpatient  2. Hypernatremia: from free water deficit from overdiuresis. Sodium back to baseline.    3. Anemia with renal failure/chronic kidney disease: status post PRBC transfusion on 10/13 and 10/14.  Hemoglobin 8.6 Obtain iron studies tomorrow    LOS: 15 Joren Rehm 10/22/201812:09 PM

## 2017-05-04 NOTE — Progress Notes (Signed)
Patient know on dreamstation bipap with 2 liters o2 bleed in. Patient tolerating well. o2 sat 100 hr 65 rr 16.

## 2017-05-05 DIAGNOSIS — Z23 Encounter for immunization: Secondary | ICD-10-CM | POA: Diagnosis not present

## 2017-05-05 LAB — BASIC METABOLIC PANEL
Anion gap: 7 (ref 5–15)
BUN: 39 mg/dL — AB (ref 6–20)
CALCIUM: 8.5 mg/dL — AB (ref 8.9–10.3)
CHLORIDE: 100 mmol/L — AB (ref 101–111)
CO2: 34 mmol/L — ABNORMAL HIGH (ref 22–32)
CREATININE: 1.36 mg/dL — AB (ref 0.44–1.00)
GFR, EST AFRICAN AMERICAN: 44 mL/min — AB (ref >60)
GFR, EST NON AFRICAN AMERICAN: 38 mL/min — AB (ref >60)
Glucose, Bld: 139 mg/dL — ABNORMAL HIGH (ref 65–99)
Potassium: 4.2 mmol/L (ref 3.5–5.1)
SODIUM: 141 mmol/L (ref 135–145)

## 2017-05-05 LAB — IRON AND TIBC
IRON: 21 ug/dL — AB (ref 28–170)
Saturation Ratios: 9 % — ABNORMAL LOW (ref 10.4–31.8)
TIBC: 227 ug/dL — AB (ref 250–450)
UIBC: 206 ug/dL

## 2017-05-05 LAB — GLUCOSE, CAPILLARY
GLUCOSE-CAPILLARY: 164 mg/dL — AB (ref 65–99)
Glucose-Capillary: 125 mg/dL — ABNORMAL HIGH (ref 65–99)

## 2017-05-05 LAB — TRANSFERRIN: Transferrin: 160 mg/dL — ABNORMAL LOW (ref 192–382)

## 2017-05-05 LAB — FERRITIN: FERRITIN: 137 ng/mL (ref 11–307)

## 2017-05-05 MED ORDER — HYDRALAZINE HCL 50 MG PO TABS: 50.0000 mg | ORAL_TABLET | Freq: Three times a day (TID) | ORAL | 1 refills | Status: DC

## 2017-05-05 MED ORDER — INSULIN GLARGINE 100 UNIT/ML SOLOSTAR PEN: PEN_INJECTOR | SUBCUTANEOUS | 1 refills | Status: DC

## 2017-05-05 MED ORDER — FUROSEMIDE 40 MG PO TABS: 40.0000 mg | ORAL_TABLET | Freq: Every day | ORAL | 1 refills | Status: DC

## 2017-05-05 MED ORDER — IPRATROPIUM-ALBUTEROL 0.5-2.5 (3) MG/3ML IN SOLN: 3.0000 mL | Freq: Four times a day (QID) | RESPIRATORY_TRACT | 0 refills | Status: DC | PRN

## 2017-05-05 NOTE — Progress Notes (Signed)
Veronica Anderson to be D/C'd Home per MD order. Patient given discharge teaching and paperwork regarding medications, diet, follow-up appointments and activity. Patient understanding verbalized. No questions or complaints at this time. Skin condition as charted. IV and telemetry removed prior to leaving.  Patient refuses home health. Already has home/portable O2. No further needs by Care Management/Social Work. Prescriptions sent to pharmacy.   An After Visit Summary was printed and given to the patient. Caregiver/family present during discharge teaching.   Patient escorted via wheelchair  Terrilyn Saver

## 2017-05-05 NOTE — Progress Notes (Signed)
CBG 125 per Mykia NT. Glucometer not synched yet.

## 2017-05-05 NOTE — Clinical Social Work Note (Signed)
CSW received referral for SNF.  Case discussed with case manager and plan is to discharge home with home health.  CSW to sign off please re-consult if social work needs arise.  Rajah Lamba R. Ayden Hardwick, MSW, LCSWA [phone]  

## 2017-05-05 NOTE — Care Management (Signed)
Spoke directly with patient's son who is declining the need for home health nurse and physical therapy. Discussed skilled nursing facility recommendations and patient and son decline. Patient has medicaid pcs care seven days a week. Patient has a cpap machine "that still works" but wants a new one because current one is so old. Son obtained results of patient's sleep study from [facility] and since patient's insurance has changed, will take the prescription to [facility].  Discussed with son that patient's sleep study results must be within one year.  Last sleep study was 2012.  Discussed the need to have physician schedule another sleep study.  Verbalized understanding. Patient and her son again asked about home health nurse and decline the service.  Son says there will be no issues gaining entry to the house. Patient's portable oxygen has been brought to unit for transport home

## 2017-05-05 NOTE — Discharge Summary (Signed)
[place] [facility] [place] NAME: Veronica Anderson    MR#:  [id number]  DATE OF BIRTH:  01-03-1946  DATE OF ADMISSION:  04/19/2017   ADMITTING PHYSICIAN: Hillary Bow, MD  DATE OF DISCHARGE: 05/05/2017  PRIMARY CARE PHYSICIAN: Hermelinda Dellen, DO   ADMISSION DIAGNOSIS:   Hypokalemia [E87.6] Elevated troponin I level [R74.8] Acute respiratory failure with hypoxia and hypercapnia (HCC) [J96.01, J96.02] Acute on chronic congestive heart failure, unspecified heart failure type ([place]) [I50.9]  DISCHARGE DIAGNOSIS:   Active Problems:   CHF (congestive heart failure), NYHA class I (HCC)   Anemia due to chronic kidney disease   SECONDARY DIAGNOSIS:   Past Medical History:  Diagnosis Date  . Allergy   . Anemia   . Asthma   . Cataract    bilateral  . CHF (NYHA class II, ACC/AHA stage C) ([place])   . Chronic kidney disease    stage IV (severe), Dr. Aleene Davidson  . Constipation   . Diabetes mellitus without complication ([place])   . Edema    feet/ankles  . GERD (gastroesophageal reflux disease)   . Hyperlipidemia   . Hypertension   . Left ankle pain   . Lumbago   . Myocardial infarction ([place])   . Neuropathy   . Neuropathy   . OA (osteoarthritis)   . Obesity   . Obstructive sleep apnea syndrome    CPAP  . Paresthesia    Foot  . Protein calorie malnutrition ([place])   . Requires supplemental oxygen   . Stroke ([place])   . Vitamin D deficiency     HOSPITAL COURSE:    71 year old female, morbidly obese with past medical history significant for chronic diastolic heart failure, hypertension, diabetes and CK D stage III presents to hospital secondary to worsening shortness of breath.  * Acute on chronic hypoxic respiratory failure - Continue to acute on chronic diastolic CHF exacerbation. - failed Bipap initially, intubated and extubated on 04/29/17- used Bipap at nights and nasal cannula during day- changed to cpap at bedtime last night and did  well - At home she is on 2Loxygen and Cpap at bedtime-however she just tells [place] that her CPAP at home is broken and needs to be running out.  Advised to discuss with advised to discuss with primary care physician about the same.  Also mentioned that if she does not get her CPAP, she might end up coming back to the hospital again. Mentioned that primary care physician about the same.  However she just also set her CPAP at home is broken and needs to be renewed. - Continue oral daily lasix now.  - continue inhalers and neb - Monitor input and output - Echocardiogram with diastolic dysfunction  * Hypertension.  Lasix, , hydralazine, norvasc and clonidine  * Insulin-dependent diabetes mellitus. ON lantus. Sliding scale insulin. -  Monitor  * CKD stage III is stable.  Monitor while on diuretics Appreciate nephrology consult  * Anemia of chronic disease- stable hb, monitor Received Tx this adm On iron supplements  Grand daughter at bedside. Updated family. Physical Therapy consulted.They have recommended rehabilitation. However patient wants to go home with home health   DISCHARGE CONDITIONS:   Guarded  CONSULTS OBTAINED:   Dr. Merton Border from ICU Dr. Candiss Norse from Nephrology Dr. Fletcher Anon from Cardiology  DRUG ALLERGIES:   Allergies  Allergen Reactions  . Ace Inhibitors Cough   DISCHARGE MEDICATIONS:   Allergies as of 05/05/2017  Reactions   Ace Inhibitors Cough      Medication List    TAKE these medications   ACCU-CHEK AVIVA PLUS test strip Generic drug:  glucose blood CHECK BLOOD SUGAR TWICE DAILY   amLODipine 10 MG tablet Commonly known as:  NORVASC TAKE 1 TABLET (10 MG TOTAL) BY MOUTH DAILY.   aspirin EC 81 MG tablet Take 81 mg by mouth at bedtime.   cloNIDine 0.3 mg/24hr patch Commonly known as:  CATAPRES - Dosed in mg/24 hr PLACE 1 PATCH (0.3 MG TOTAL) ONTO THE SKIN ONCE A WEEK. APPLY PATCH ON SUNDAY.   desonide 0.05 % ointment Commonly known  as:  DESOWEN Apply 1 application topically 2 (two) times daily.   furosemide 40 MG tablet Commonly known as:  LASIX Take 1 tablet (40 mg total) by mouth daily. What changed:  medication strength  how much to take  when to take this   gabapentin 300 MG capsule Commonly known as:  NEURONTIN TAKE 1 CAPSULE (300 MG TOTAL) BY MOUTH 3 (THREE) TIMES DAILY.   hydrALAZINE 50 MG tablet Commonly known as:  APRESOLINE Take 1 tablet (50 mg total) by mouth 3 (three) times daily. What changed:  medication strength  how much to take   Insulin Glargine 100 UNIT/ML Solostar Pen Commonly known as:  LANTUS SOLOSTAR INJECT 35 UNITS SUBCUTANEOUSLY DAILY What changed:  additional instructions   insulin lispro 100 UNIT/ML KiwkPen Commonly known as:  HUMALOG KWIKPEN INJECT 5 TO 10 UNITS SUBCUTANEOUSLY BEFORE MEALS What changed:  how much to take  how to take this  when to take this  additional instructions   ipratropium-albuterol 0.5-2.5 (3) MG/3ML Soln Commonly known as:  DUONEB Take 3 mLs by nebulization every 6 (six) hours as needed.   loratadine 10 MG tablet Commonly known as:  CLARITIN TAKE 1 TABLET BY MOUTH EVERY DAY FOR ALLERGIES   metoprolol tartrate 100 MG tablet Commonly known as:  LOPRESSOR Take 1 tablet (100 mg total) by mouth 2 (two) times daily.   montelukast 10 MG tablet Commonly known as:  SINGULAIR Take 1 tablet (10 mg total) by mouth daily.   NOVOFINE 32G X 6 MM Misc Generic drug:  Insulin Pen Needle USE 4 TIMES A DAY AS DIRECTED   nystatin powder Commonly known as:  nystatin Apply 1 g topically 3 (three) times daily.   pravastatin 40 MG tablet Commonly known as:  PRAVACHOL TAKE 1 TABLET BY MOUTH ONCE A DAY FOR CHOLESTEROL   ranitidine 150 MG tablet Commonly known as:  ZANTAC TAKE 1 TABLET (150 MG TOTAL) BY MOUTH 2 (TWO) TIMES DAILY. TRYING TO WEAN OFF OMEPRAZOLE   Vitamin D (Ergocalciferol) 50000 units Caps capsule Commonly known as:   DRISDOL TAKE ONE CAPSULE BY MOUTH ONCE A WEEK        DISCHARGE INSTRUCTIONS:    1. PCP follow-up in 1-2 weeks 2. Nephrology follow-up in 2 weeks Severe cardiology follow up in 2 weeks  DIET:   Cardiac diet and Diabetic diet  ACTIVITY:   Activity as tolerated  OXYGEN:   Home Oxygen: Yes.    Oxygen Delivery: 2 liters/min via Patient connected to nasal cannula oxygen  DISCHARGE LOCATION:   home   If you experience worsening of your admission symptoms, develop shortness of breath, life threatening emergency, suicidal or homicidal thoughts you must seek medical attention immediately by calling 911 or calling your MD immediately  if symptoms less severe.  You Must read complete instructions/literature along with all the possible  adverse reactions/side effects for all the Medicines you take and that have been prescribed to you. Take any new Medicines after you have completely understood and accpet all the possible adverse reactions/side effects.   Please note  You were cared for by a hospitalist during your hospital stay. If you have any questions about your discharge medications or the care you received while you were in the hospital after you are discharged, you can call the unit and asked to speak with the hospitalist on call if the hospitalist that took care of you is not available. Once you are discharged, your primary care physician will handle any further medical issues. Please note that NO REFILLS for any discharge medications will be authorized once you are discharged, as it is imperative that you return to your primary care physician (or establish a relationship with a primary care physician if you do not have one) for your aftercare needs so that they can reassess your need for medications and monitor your lab values.    On the day of Discharge:  VITAL SIGNS:   Blood pressure (!) 154/53, pulse 63, temperature 98.1 F (36.7 C), temperature source Oral, resp. rate 18,  height 5\' 5"  (1.651 m), weight 119.6 kg (263 lb 11.2 oz), SpO2 99 %.  PHYSICAL EXAMINATION:    GENERAL: 71 y.o.-year-old obese patient lying in the bed with no acute distress.  EYES: Pupils equal, round, reactive to light and accommodation. No scleral icterus. Extraocular muscles intact.  HEENT: Head atraumatic, normocephalic. Oropharynx and nasopharynx clear.  NECK: Supple, no jugular venous distention. No thyroid enlargement, no tenderness.  LUNGS: Normal breath sounds,, scattered wheezing, , no rales,rhonchi or crepitation. No use of accessory muscles of respiration. Decreased at the bases No stridor today CARDIOVASCULAR: S1, S2 normal. No murmurs, rubs, or gallops.  ABDOMEN: Soft, nontender, nondistended. Bowel sounds present. No organomegaly or mass.  EXTREMITIES: No pedal edema, cyanosis, or clubbing.  NEUROLOGIC: Cranial nerves II through XII are intact. Muscle strength 5/5 in all extremities. Sensation intact. Gait not checked. Globally very weak PSYCHIATRIC: The patient is alert and oriented x 3.  SKIN: No obvious rash, lesion, or ulcer.   DATA REVIEW:   CBC  Recent Labs Lab 05/03/17 0458  WBC 7.0  HGB 8.6*  HCT 27.9*  PLT 465*    Chemistries   Recent Labs Lab 05/02/17 0559  05/05/17 0531  NA 145  < > 141  K 3.4*  < > 4.2  CL 99*  < > 100*  CO2 35*  < > 34*  GLUCOSE 56*  < > 139*  BUN 55*  < > 39*  CREATININE 1.37*  < > 1.36*  CALCIUM 8.6*  < > 8.5*  MG 2.2  --   --   < > = values in this interval not displayed.   Microbiology Results  Results for orders placed or performed during the hospital encounter of 04/19/17  MRSA PCR Screening     Status: Abnormal   Collection Time: 04/19/17 10:07 AM  Result Value Ref Range Status   MRSA by PCR POSITIVE (A) NEGATIVE Final    Comment:        The GeneXpert MRSA Assay (FDA approved for NASAL specimens only), is one component of a comprehensive MRSA colonization surveillance program. It is not intended to  diagnose MRSA infection nor to guide or monitor treatment for MRSA infections. RESULT CALLED TO, READ BACK BY AND VERIFIED WITH: SANDRA BOREA ON 04/19/17 AT 1228 [facility]  RADIOLOGY:  No results found.   Management plans discussed with the patient, family and they are in agreement.  CODE STATUS:     Code Status Orders        Start     Ordered   04/19/17 0813  Full code  Continuous     04/19/17 0813    Code Status History    Date Active Date Inactive Code Status Order ID Comments User Context   11/07/2016  9:02 AM 11/10/2016  9:16 PM Full Code [id number]  Hillary Bow, MD ED   03/27/2015  7:41 PM 03/31/2015  3:34 PM Full Code [id number]  Dustin Flock, MD ED    Advance Directive Documentation     Most Recent Value  Type of Advance Directive  Healthcare Power of Attorney  Pre-existing out of facility DNR order (yellow form or pink MOST form)  -  "MOST" Form in Place?  -      TOTAL TIME TAKING CARE OF THIS PATIENT: 38 minutes.    Zarinah Oviatt M.D on 05/05/2017 at 12:18 PM  Between 7am to 6pm - Pager - [phone]  After 6pm go to [email or web address] - Proofreader  [facility]  Office  [phone]  CC: Primary care physician; Hermelinda Dellen, DO   Note: This dictation was prepared with Dragon dictation along with smaller phrase technology. Any transcriptional errors that result from this process are unintentional.

## 2017-05-06 ENCOUNTER — Telehealth: Payer: Self-pay

## 2017-05-06 NOTE — Telephone Encounter (Signed)
Copied from [place] #[id number]. Topic: Quick Communication - See Telephone Encounter >> May 06, 2017  3:50 PM Burnis Medin, NT wrote: CRM for notification. See Telephone encounter for:  05/06/17. Pt.'s son called and wanted to see if the doctor could recommend Physical Therapy at home for his mother because she just got out of the hospital.

## 2017-05-08 NOTE — Telephone Encounter (Signed)
She will need face to face visit to get it approved by insurance. I will order it during her visit on 10/29

## 2017-05-08 NOTE — Telephone Encounter (Signed)
Son called requesting that his mom has Physical Therapy at her home. Dr. Ancil Boozer want her to have a face to face visit for that. Please call and schedule an appointment and she will place an order for Physical Therapy at that time.

## 2017-05-08 NOTE — Telephone Encounter (Signed)
Lvm for pt to return call and schedule an appt.

## 2017-05-10 DIAGNOSIS — I509 Heart failure, unspecified: Secondary | ICD-10-CM | POA: Diagnosis not present

## 2017-05-11 ENCOUNTER — Ambulatory Visit: Payer: Self-pay | Admitting: Family

## 2017-05-11 ENCOUNTER — Telehealth: Payer: Self-pay | Admitting: Family

## 2017-05-11 NOTE — Telephone Encounter (Signed)
Patient did not show for her [facility] appointment on 05/11/17. Will attempt to reschedule.

## 2017-05-21 ENCOUNTER — Other Ambulatory Visit: Payer: Self-pay

## 2017-05-21 MED ORDER — HYDRALAZINE HCL 50 MG PO TABS: 50.0000 mg | ORAL_TABLET | Freq: Three times a day (TID) | ORAL | 1 refills | Status: DC

## 2017-05-21 NOTE — Telephone Encounter (Signed)
Refill request for Hypertension medication:  Hydralazine 50 mg  Last office visit pertaining to hypertension: 12/23/2016  BP Readings from Last 3 Encounters:  05/05/17 (!) 154/53  12/23/16 132/68  11/10/16 (!) 153/51    Lab Results  Component Value Date   CREATININE 1.36 (H) 05/05/2017   BUN 39 (H) 05/05/2017   NA 141 05/05/2017   K 4.2 05/05/2017   CL 100 (L) 05/05/2017   CO2 34 (H) 05/05/2017

## 2017-05-25 ENCOUNTER — Other Ambulatory Visit: Payer: Self-pay | Admitting: Family Medicine

## 2017-05-25 DIAGNOSIS — J302 Other seasonal allergic rhinitis: Secondary | ICD-10-CM

## 2017-05-25 DIAGNOSIS — J3089 Other allergic rhinitis: Principal | ICD-10-CM

## 2017-05-25 NOTE — Telephone Encounter (Signed)
Refill request for general medication: Singulair  Last office visit: 12/23/2016  Last physical exam: None indicated  Follow up visit:  None indicated

## 2017-06-10 ENCOUNTER — Telehealth: Payer: Self-pay

## 2017-06-10 DIAGNOSIS — I509 Heart failure, unspecified: Secondary | ICD-10-CM | POA: Diagnosis not present

## 2017-06-10 NOTE — Telephone Encounter (Signed)
Medicare requires a face-to-face encounter, she will need an appointment.

## 2017-06-10 NOTE — Telephone Encounter (Signed)
Copied from [place] [id number]. Topic: Referral - Request >> Jun 09, 2017  3:57 PM Aurelio Brash B wrote: Son called to say pt is just Starting to walk since out of hospital  and  will make  an apt in one month and wants a referral to PT home health.

## 2017-06-11 NOTE — Telephone Encounter (Signed)
LVM for pt to call the office to schedule an appt for referral to PT

## 2017-06-19 ENCOUNTER — Other Ambulatory Visit: Payer: Self-pay | Admitting: Family Medicine

## 2017-06-19 DIAGNOSIS — N183 Chronic kidney disease, stage 3 unspecified: Secondary | ICD-10-CM

## 2017-06-19 DIAGNOSIS — I129 Hypertensive chronic kidney disease with stage 1 through stage 4 chronic kidney disease, or unspecified chronic kidney disease: Secondary | ICD-10-CM

## 2017-06-24 ENCOUNTER — Other Ambulatory Visit: Payer: Self-pay | Admitting: Family Medicine

## 2017-06-24 DIAGNOSIS — N183 Chronic kidney disease, stage 3 (moderate): Principal | ICD-10-CM

## 2017-06-24 DIAGNOSIS — I129 Hypertensive chronic kidney disease with stage 1 through stage 4 chronic kidney disease, or unspecified chronic kidney disease: Secondary | ICD-10-CM

## 2017-06-25 NOTE — Telephone Encounter (Signed)
Refill request for Hypertension medication:  Metoprolol 100 mg  Last office visit pertaining to hypertension: 12/23/2016  Follow up visit: None indicated  BP Readings from Last 3 Encounters:  05/05/17 (!) 154/53  12/23/16 132/68  11/10/16 (!) 153/51     Lab Results  Component Value Date   CREATININE 1.36 (H) 05/05/2017   BUN 39 (H) 05/05/2017   NA 141 05/05/2017   K 4.2 05/05/2017   CL 100 (L) 05/05/2017   CO2 34 (H) 05/05/2017

## 2017-06-26 NOTE — Telephone Encounter (Signed)
No longer a pt here

## 2017-07-05 ENCOUNTER — Other Ambulatory Visit: Payer: Self-pay | Admitting: Family Medicine

## 2017-07-05 DIAGNOSIS — E785 Hyperlipidemia, unspecified: Secondary | ICD-10-CM

## 2017-07-06 NOTE — Telephone Encounter (Signed)
Refill Request for Cholesterol medication. 12/23/2016  Last physical: None indicated  Follow up visit: None indicated  Lab Results  Component Value Date   CHOL 139 08/21/2016   HDL 43 (L) 08/21/2016   LDLCALC 80 08/21/2016   TRIG 80 08/21/2016   CHOLHDL 3.2 08/21/2016

## 2017-07-10 DIAGNOSIS — I509 Heart failure, unspecified: Secondary | ICD-10-CM | POA: Diagnosis not present

## 2017-07-11 ENCOUNTER — Other Ambulatory Visit: Payer: Self-pay | Admitting: Family Medicine

## 2017-07-11 DIAGNOSIS — I129 Hypertensive chronic kidney disease with stage 1 through stage 4 chronic kidney disease, or unspecified chronic kidney disease: Secondary | ICD-10-CM

## 2017-07-11 DIAGNOSIS — N183 Chronic kidney disease, stage 3 (moderate): Principal | ICD-10-CM

## 2017-07-12 ENCOUNTER — Other Ambulatory Visit: Payer: Self-pay | Admitting: Family Medicine

## 2017-07-15 ENCOUNTER — Other Ambulatory Visit: Payer: Self-pay | Admitting: Family Medicine

## 2017-07-15 DIAGNOSIS — E785 Hyperlipidemia, unspecified: Secondary | ICD-10-CM

## 2017-07-19 ENCOUNTER — Telehealth: Payer: Self-pay | Admitting: Family Medicine

## 2017-07-19 DIAGNOSIS — N183 Chronic kidney disease, stage 3 unspecified: Secondary | ICD-10-CM

## 2017-07-19 DIAGNOSIS — I129 Hypertensive chronic kidney disease with stage 1 through stage 4 chronic kidney disease, or unspecified chronic kidney disease: Secondary | ICD-10-CM

## 2017-07-24 NOTE — Telephone Encounter (Signed)
In your note in November you advise the patient to come for a 1 month F/U. Patient states your the only PCP doctor but she has been very sick and in the hospital on life support.

## 2017-07-24 NOTE — Telephone Encounter (Signed)
Son calling and checking to see why this was denied for refill, patient is scheduled 08/19/17. Out of meds. Call back [phone]

## 2017-07-27 ENCOUNTER — Telehealth: Payer: Self-pay

## 2017-07-27 NOTE — Telephone Encounter (Signed)
Call pharmacy, and only give enough for one week of what she needs, I will refill medications during her follow up this week

## 2017-07-27 NOTE — Telephone Encounter (Signed)
Called 3 days supplies of all medications into [facility] for patient.

## 2017-07-27 NOTE — Telephone Encounter (Signed)
Patient son was told he needed to come in earlier for an appointment for his mother if she was completely out of medication because the last time we have seen her was 12/23/2016. He states she is completely out of medication and asked we send in a few days supply and reschedule her appointment for Wednesday 07/29/2017.

## 2017-07-29 ENCOUNTER — Encounter: Payer: Self-pay | Admitting: Family Medicine

## 2017-07-29 ENCOUNTER — Emergency Department
Admission: EM | Admit: 2017-07-29 | Discharge: 2017-07-29 | Discharge status: Against Medical Advice | Payer: Medicare Other

## 2017-07-29 ENCOUNTER — Ambulatory Visit (INDEPENDENT_AMBULATORY_CARE_PROVIDER_SITE_OTHER): Payer: Medicare Other | Admitting: Family Medicine

## 2017-07-29 VITALS — BP 146/62 | HR 92 | Temp 98.3°F | Resp 16 | Ht 64.0 in

## 2017-07-29 DIAGNOSIS — M1711 Unilateral primary osteoarthritis, right knee: Secondary | ICD-10-CM | POA: Diagnosis not present

## 2017-07-29 DIAGNOSIS — E559 Vitamin D deficiency, unspecified: Secondary | ICD-10-CM | POA: Diagnosis not present

## 2017-07-29 DIAGNOSIS — M7989 Other specified soft tissue disorders: Secondary | ICD-10-CM | POA: Diagnosis not present

## 2017-07-29 DIAGNOSIS — I129 Hypertensive chronic kidney disease with stage 1 through stage 4 chronic kidney disease, or unspecified chronic kidney disease: Secondary | ICD-10-CM | POA: Diagnosis not present

## 2017-07-29 DIAGNOSIS — J45909 Unspecified asthma, uncomplicated: Secondary | ICD-10-CM | POA: Diagnosis not present

## 2017-07-29 DIAGNOSIS — N183 Chronic kidney disease, stage 3 (moderate): Secondary | ICD-10-CM | POA: Diagnosis not present

## 2017-07-29 DIAGNOSIS — E441 Mild protein-calorie malnutrition: Secondary | ICD-10-CM

## 2017-07-29 DIAGNOSIS — I130 Hypertensive heart and chronic kidney disease with heart failure and stage 1 through stage 4 chronic kidney disease, or unspecified chronic kidney disease: Secondary | ICD-10-CM | POA: Diagnosis not present

## 2017-07-29 DIAGNOSIS — E1122 Type 2 diabetes mellitus with diabetic chronic kidney disease: Secondary | ICD-10-CM | POA: Diagnosis not present

## 2017-07-29 DIAGNOSIS — I5030 Unspecified diastolic (congestive) heart failure: Secondary | ICD-10-CM | POA: Diagnosis not present

## 2017-07-29 DIAGNOSIS — E113299 Type 2 diabetes mellitus with mild nonproliferative diabetic retinopathy without macular edema, unspecified eye: Secondary | ICD-10-CM | POA: Diagnosis not present

## 2017-07-29 DIAGNOSIS — M79605 Pain in left leg: Secondary | ICD-10-CM | POA: Diagnosis not present

## 2017-07-29 DIAGNOSIS — M79652 Pain in left thigh: Secondary | ICD-10-CM | POA: Diagnosis not present

## 2017-07-29 DIAGNOSIS — G4733 Obstructive sleep apnea (adult) (pediatric): Secondary | ICD-10-CM

## 2017-07-29 DIAGNOSIS — M79651 Pain in right thigh: Secondary | ICD-10-CM | POA: Diagnosis not present

## 2017-07-29 DIAGNOSIS — Z794 Long term (current) use of insulin: Secondary | ICD-10-CM

## 2017-07-29 DIAGNOSIS — N950 Postmenopausal bleeding: Secondary | ICD-10-CM

## 2017-07-29 DIAGNOSIS — D62 Acute posthemorrhagic anemia: Secondary | ICD-10-CM | POA: Diagnosis not present

## 2017-07-29 DIAGNOSIS — N184 Chronic kidney disease, stage 4 (severe): Secondary | ICD-10-CM

## 2017-07-29 DIAGNOSIS — Z79899 Other long term (current) drug therapy: Secondary | ICD-10-CM | POA: Diagnosis not present

## 2017-07-29 DIAGNOSIS — M6281 Muscle weakness (generalized): Secondary | ICD-10-CM | POA: Diagnosis not present

## 2017-07-29 DIAGNOSIS — E785 Hyperlipidemia, unspecified: Secondary | ICD-10-CM

## 2017-07-29 DIAGNOSIS — K219 Gastro-esophageal reflux disease without esophagitis: Secondary | ICD-10-CM | POA: Diagnosis not present

## 2017-07-29 DIAGNOSIS — Z7982 Long term (current) use of aspirin: Secondary | ICD-10-CM | POA: Diagnosis not present

## 2017-07-29 DIAGNOSIS — Z87891 Personal history of nicotine dependence: Secondary | ICD-10-CM | POA: Diagnosis not present

## 2017-07-29 DIAGNOSIS — D649 Anemia, unspecified: Secondary | ICD-10-CM | POA: Diagnosis not present

## 2017-07-29 DIAGNOSIS — S79921A Unspecified injury of right thigh, initial encounter: Secondary | ICD-10-CM | POA: Diagnosis not present

## 2017-07-29 DIAGNOSIS — M25551 Pain in right hip: Secondary | ICD-10-CM | POA: Diagnosis not present

## 2017-07-29 DIAGNOSIS — I5032 Chronic diastolic (congestive) heart failure: Secondary | ICD-10-CM | POA: Diagnosis not present

## 2017-07-29 DIAGNOSIS — E1140 Type 2 diabetes mellitus with diabetic neuropathy, unspecified: Secondary | ICD-10-CM

## 2017-07-29 DIAGNOSIS — I69344 Monoplegia of lower limb following cerebral infarction affecting left non-dominant side: Secondary | ICD-10-CM | POA: Diagnosis not present

## 2017-07-29 LAB — POCT GLYCOSYLATED HEMOGLOBIN (HGB A1C): Hemoglobin A1C: 6.1

## 2017-07-29 NOTE — Progress Notes (Deleted)
Name: Veronica Anderson   MRN: [id number]    DOB: Feb 08, 1946   Date:07/29/2017       Progress Note  Subjective  Chief Complaint  Chief Complaint  Patient presents with  . Medication Refill    7 month F/U  . Diabetes    Checks twice daily and states her BS has been low  . Hypertension    Denies any symptoms  . Chronic Kidney Disease  . Sleep Apnea  . Gastroesophageal Reflux    Ran out of medication  . Vaginal Bleeding    Having blood clots from her vaginal area since last Friday. Having stabbing pain in her outer thigh since the bleeding started.   . Allergic Rhinitis     Runny nose and coughing-start Coricidin  . Shortness of Breath    When to hospital was ICU for 14 days. Started her back on Hydralazine on a higher dose.    HPI  DMII: unable to stands to get weight checked today. .  She is following a diabetic diet and glucose has been at goal,down to 28 units of Lantus and no longer has hypoglycemia.  She has been using Humalog 5 units before meals. She went for an eye exam and had surgery, seeing better. she states neuropathy is under control with Gabapentin - no pain, but still had decrease in sensation of both feet.  she also has CKI and is not on ACE because of history of allergy.   HTN: she has been taking medication as prescribed, no chest pain or palpitation. Complaint with medication and denies side effects.   Hyperlipidemia: she has been out of medication for the past week.   CKI: she was seen by nephrologist while hospitalized 04/2017 but lost to follow up since. We will recheck labs  History of blood transfusion, hgb was down to 6 during hospital stay, no labs since discharge  OSA: uses CPAP every night, all night and is having supplemental oxygen 2 liters through the CPAP machine. She has a rash on her face from the mask.  CHF: diastolic, she still has orthopnea, uses 3 pillows at night, using CPAP, mild  leg swelling, states she has SOB with activity. On  beta-blocker, hydralazine, not on ACE/ARB - she is not sure if she had angioedema, but afraid to try it. She was hospitalized 04/2017 had to go to ICU and sent home upon discharge, she did not follow up in our office, with cardiologist or nephrologist. Discussed with her son Veronica Anderson) the importance of hospital follow ups  GERD: under control, she is on Omeprazole and discussed long term risk of medication, advised to wean self off.    Post-menopausal bleeding: she states since last week she has noticed vaginal bleeding, no previous history of vaginal bleeding, no rectal bleeding, stools are normal, no nausea, vomiting, she denies abdominal cramping, but has pain on lateral thighs since bleeding started    Patient Active Problem List   Diagnosis Date Noted  . Chronic kidney disease (CKD), stage IV (severe) ([place]) 07/29/2017  . Mild protein-calorie malnutrition ([place]) 07/29/2017  . Anemia due to chronic kidney disease   . Prolonged Q-T interval on ECG 12/23/2016  . CHF (congestive heart failure), NYHA class I ([place]) 11/07/2016  . Nocturnal oxygen desaturation 06/26/2015  . Anemia in chronic illness 02/23/2015  . Asthma, mild intermittent 02/23/2015  . Benign essential HTN 02/23/2015  . Diastolic heart failure, NYHA class 3 ([place]) 02/23/2015  . Benign hypertension with chronic kidney disease, stage  III ([place]) 02/23/2015  . CN (constipation) 02/23/2015  . Diabetes mellitus with neuropathy ([place]) 02/23/2015  . Dyslipidemia 02/23/2015  . Elevated ferritin 02/23/2015  . Gastro-esophageal reflux disease without esophagitis 02/23/2015  . LBP (low back pain) 02/23/2015  . Morbid obesity due to excess calories ([place]) 02/23/2015  . Obstructive apnea 02/23/2015  . Osteoarthritis 02/23/2015  . Neuropathy 02/23/2015  . Perennial allergic rhinitis with seasonal variation 02/23/2015  . History of pneumonia 02/23/2015  . Vitamin D deficiency 02/23/2015  . Cataract 04/16/2010  . Background diabetic  retinopathy ([place]) 12/18/2009    Past Surgical History:  Procedure Laterality Date  . ANKLE SURGERY Left   . CATARACT EXTRACTION W/PHACO Right 12/20/2015   Procedure: CATARACT EXTRACTION PHACO AND INTRAOCULAR LENS PLACEMENT (IOC);  Surgeon: Eulogio Bear, MD;  Location: [facility] ORS;  Service: Ophthalmology;  Laterality: Right;  [place] 2.50 AP% 21.8 CDE 37.04 Fluid pack lot # [id number]  . CATARACT EXTRACTION W/PHACO Left 02/14/2016   Procedure: CATARACT EXTRACTION PHACO AND INTRAOCULAR LENS PLACEMENT (IOC);  Surgeon: Eulogio Bear, MD;  Location: [facility] ORS;  Service: Ophthalmology;  Laterality: Left;  [place] 3.12 AP% 42.1 CDE 47.12 Fluid Pack lot # [id number]  . COLONOSCOPY    . FRACTURE SURGERY Left    ankle    Family History  Problem Relation Age of Onset  . Diabetes type II Unknown     Social History   Socioeconomic History  . Marital status: Single    Spouse name: Not on file  . Number of children: Not on file  . Years of education: Not on file  . Highest education level: Not on file  Social Needs  . Financial resource strain: Not on file  . Food insecurity - worry: Not on file  . Food insecurity - inability: Not on file  . Transportation needs - medical: Not on file  . Transportation needs - non-medical: Not on file  Occupational History  . Not on file  Tobacco Use  . Smoking status: Former Smoker    Types: Cigarettes    Last attempt to quit: 07/14/1994    Years since quitting: 23.0  . Smokeless tobacco: Never Used  Substance and Sexual Activity  . Alcohol use: No    Alcohol/week: 0.0 oz  . Drug use: No  . Sexual activity: Not Currently  Other Topics Concern  . Not on file  Social History Narrative  . Not on file     Current Outpatient Medications:  .  ACCU-CHEK AVIVA PLUS test strip, CHECK BLOOD SUGAR TWICE DAILY, Disp: 200 each, Rfl: 3 .  amLODipine (NORVASC) 10 MG tablet, TAKE 1 TABLET (10 MG TOTAL) BY MOUTH DAILY., Disp: 90 tablet, Rfl: 1 .  aspirin EC 81 MG  tablet, Take 81 mg by mouth at bedtime., Disp: , Rfl:  .  cloNIDine (CATAPRES - DOSED IN MG/24 HR) 0.3 mg/24hr patch, PLACE 1 PATCH (0.3 MG TOTAL) ONTO THE SKIN ONCE A WEEK. APPLY PATCH ON SUNDAY., Disp: 12 patch, Rfl: 1 .  cloNIDine (CATAPRES - DOSED IN MG/24 HR) 0.3 mg/24hr patch, Place 1 patch onto the skin once a week., Disp: , Rfl:  .  desonide (DESOWEN) 0.05 % ointment, Apply 1 application topically 2 (two) times daily., Disp: 60 g, Rfl: 0 .  furosemide (LASIX) 40 MG tablet, Take 1 tablet (40 mg total) by mouth daily., Disp: 30 tablet, Rfl: 1 .  gabapentin (NEURONTIN) 300 MG capsule, TAKE 1 CAPSULE (300 MG TOTAL) BY MOUTH 3 (THREE) TIMES DAILY.,  Disp: 270 capsule, Rfl: 2 .  hydrALAZINE (APRESOLINE) 50 MG tablet, Take 1 tablet (50 mg total) 3 (three) times daily by mouth., Disp: 90 tablet, Rfl: 1 .  Insulin Glargine (LANTUS SOLOSTAR) 100 UNIT/ML Solostar Pen, INJECT 35 UNITS SUBCUTANEOUSLY DAILY, Disp: 15 pen, Rfl: 1 .  insulin lispro (HUMALOG KWIKPEN) 100 UNIT/ML KiwkPen, INJECT 5 TO 10 UNITS SUBCUTANEOUSLY BEFORE MEALS (Patient taking differently: Inject 5 Units into the skin 3 (three) times daily. ), Disp: 6 mL, Rfl: 5 .  metoprolol (LOPRESSOR) 100 MG tablet, Take 1 tablet (100 mg total) by mouth 2 (two) times daily., Disp: 180 tablet, Rfl: 2 .  montelukast (SINGULAIR) 10 MG tablet, Take 1 tablet (10 mg total) daily by mouth., Disp: 90 tablet, Rfl: 2 .  NOVOFINE 32G X 6 MM MISC, USE 4 TIMES A DAY AS DIRECTED, Disp: 100 each, Rfl: 5 .  nystatin (NYSTATIN) powder, Apply 1 g topically 3 (three) times daily., Disp: 45 g, Rfl: 1 .  pravastatin (PRAVACHOL) 40 MG tablet, TAKE 1 TABLET BY MOUTH ONCE A DAY FOR CHOLESTEROL, Disp: 90 tablet, Rfl: 2 .  ranitidine (ZANTAC) 150 MG tablet, TAKE 1 TABLET (150 MG TOTAL) BY MOUTH 2 (TWO) TIMES DAILY. TRYING TO WEAN OFF OMEPRAZOLE, Disp: 180 tablet, Rfl: 1 .  Vitamin D, Ergocalciferol, (DRISDOL) 50000 units CAPS capsule, TAKE ONE CAPSULE BY MOUTH ONCE A WEEK,  Disp: 4 capsule, Rfl: 1 .  ipratropium-albuterol (DUONEB) 0.5-2.5 (3) MG/3ML SOLN, Take 3 mLs by nebulization every 6 (six) hours as needed. (Patient not taking: Reported on 07/29/2017), Disp: 360 mL, Rfl: 0 .  loratadine (CLARITIN) 10 MG tablet, TAKE 1 TABLET BY MOUTH EVERY DAY FOR ALLERGIES (Patient not taking: Reported on 07/29/2017), Disp: 30 tablet, Rfl: 5  Allergies  Allergen Reactions  . Ace Inhibitors Cough     ROS  Constitutional: Negative for fever or weight change.  Respiratory: Negative for cough, but has  shortness of breath with activity    Cardiovascular: Negative for chest pain or palpitations.  Gastrointestinal: Negative for abdominal pain, no bowel changes.  Musculoskeletal: positive for gait problem but no  joint swelling.  Skin: Negative for rash.  Neurological: Negative for dizziness or headache.  No other specific complaints in a complete review of systems (except as listed in HPI above).  Objective  Vitals:   07/29/17 1358  BP: (!) 146/62  Pulse: 92  Resp: 16  Temp: 98.3 F (36.8 C)  TempSrc: Oral  SpO2: 94%  Height: 5' 4"  (1.626 m)    Body mass index is 45.26 kg/m.  Physical Exam   Constitutional: Patient appears well-developed Obese No distress, on wheelchair, not using oxygen at this time HEENT: head atraumatic, normocephalic, pupils equal and reactive to light,neck supple, throat within normal limits Cardiovascular: Normal rate, regular rhythm and normal heart sounds. No murmur heard. 1 plus BLE edema. Pulmonary/Chest: Effort normal and breath sounds normal. No respiratory distress. Abdominal: Soft. There is no tenderness. Psychiatric: Patient has a normal mood and affect. behavior is normal. Judgment and thought content normal. Muscular Skeletal: did not stand up to check her weight today, difficulty with ambulation since hospital discharge back 04/2017   Recent Results (from the past 2160 hour(s))  Glucose, capillary     Status: Abnormal    Collection Time: 04/30/17  4:06 PM  Result Value Ref Range   Glucose-Capillary 188 (H) 65 - 99 mg/dL  Glucose, capillary     Status: Abnormal   Collection Time: 04/30/17  7:44 PM  Result Value Ref Range   Glucose-Capillary 190 (H) 65 - 99 mg/dL  Glucose, capillary     Status: Abnormal   Collection Time: 04/30/17 11:36 PM  Result Value Ref Range   Glucose-Capillary 159 (H) 65 - 99 mg/dL  Glucose, capillary     Status: None   Collection Time: 05/01/17  3:48 AM  Result Value Ref Range   Glucose-Capillary 90 65 - 99 mg/dL  Basic metabolic panel     Status: Abnormal   Collection Time: 05/01/17  3:50 AM  Result Value Ref Range   Sodium 146 (H) 135 - 145 mmol/L   Potassium 3.7 3.5 - 5.1 mmol/L   Chloride 103 101 - 111 mmol/L   CO2 34 (H) 22 - 32 mmol/L   Glucose, Bld 97 65 - 99 mg/dL   BUN 53 (H) 6 - 20 mg/dL   Creatinine, Ser 1.37 (H) 0.44 - 1.00 mg/dL   Calcium 9.0 8.9 - 10.3 mg/dL   GFR calc non Af Amer 38 (L) >60 mL/min   GFR calc Af Amer 44 (L) >60 mL/min    Comment: (NOTE) The eGFR has been calculated using the CKD EPI equation. This calculation has not been validated in all clinical situations. eGFR's persistently <60 mL/min signify possible Chronic Kidney Disease.    Anion gap 9 5 - 15  CBC     Status: Abnormal   Collection Time: 05/01/17  3:50 AM  Result Value Ref Range   WBC 10.6 3.6 - 11.0 K/uL   RBC 3.80 3.80 - 5.20 MIL/uL   Hemoglobin 8.8 (L) 12.0 - 16.0 g/dL   HCT 28.0 (L) 35.0 - 47.0 %   MCV 73.6 (L) 80.0 - 100.0 fL   MCH 23.2 (L) 26.0 - 34.0 pg   MCHC 31.5 (L) 32.0 - 36.0 g/dL   RDW 21.5 (H) 11.5 - 14.5 %   Platelets 443 (H) 150 - 440 K/uL  Glucose, capillary     Status: Abnormal   Collection Time: 05/01/17  7:51 AM  Result Value Ref Range   Glucose-Capillary 103 (H) 65 - 99 mg/dL  Glucose, capillary     Status: Abnormal   Collection Time: 05/01/17 11:28 AM  Result Value Ref Range   Glucose-Capillary 174 (H) 65 - 99 mg/dL  Glucose, capillary      Status: Abnormal   Collection Time: 05/01/17  5:12 PM  Result Value Ref Range   Glucose-Capillary 110 (H) 65 - 99 mg/dL  Glucose, capillary     Status: Abnormal   Collection Time: 05/01/17 10:26 PM  Result Value Ref Range   Glucose-Capillary 107 (H) 65 - 99 mg/dL  Basic metabolic panel     Status: Abnormal   Collection Time: 05/02/17  5:59 AM  Result Value Ref Range   Sodium 145 135 - 145 mmol/L   Potassium 3.4 (L) 3.5 - 5.1 mmol/L   Chloride 99 (L) 101 - 111 mmol/L   CO2 35 (H) 22 - 32 mmol/L   Glucose, Bld 56 (L) 65 - 99 mg/dL   BUN 55 (H) 6 - 20 mg/dL   Creatinine, Ser 1.37 (H) 0.44 - 1.00 mg/dL   Calcium 8.6 (L) 8.9 - 10.3 mg/dL   GFR calc non Af Amer 38 (L) >60 mL/min   GFR calc Af Amer 44 (L) >60 mL/min    Comment: (NOTE) The eGFR has been calculated using the CKD EPI equation. This calculation has not been validated in all clinical situations. eGFR's persistently <60 mL/min signify  possible Chronic Kidney Disease.    Anion gap 11 5 - 15  CBC     Status: Abnormal   Collection Time: 05/02/17  5:59 AM  Result Value Ref Range   WBC 7.3 3.6 - 11.0 K/uL   RBC 3.76 (L) 3.80 - 5.20 MIL/uL   Hemoglobin 8.6 (L) 12.0 - 16.0 g/dL   HCT 27.4 (L) 35.0 - 47.0 %   MCV 72.8 (L) 80.0 - 100.0 fL   MCH 23.0 (L) 26.0 - 34.0 pg   MCHC 31.6 (L) 32.0 - 36.0 g/dL   RDW 21.2 (H) 11.5 - 14.5 %   Platelets 429 150 - 440 K/uL  Magnesium     Status: None   Collection Time: 05/02/17  5:59 AM  Result Value Ref Range   Magnesium 2.2 1.7 - 2.4 mg/dL  Phosphorus     Status: None   Collection Time: 05/02/17  5:59 AM  Result Value Ref Range   Phosphorus 3.8 2.5 - 4.6 mg/dL  Brain natriuretic peptide     Status: None   Collection Time: 05/02/17  5:59 AM  Result Value Ref Range   B Natriuretic Peptide 100.0 0.0 - 100.0 pg/mL  Glucose, capillary     Status: Abnormal   Collection Time: 05/02/17  8:00 AM  Result Value Ref Range   Glucose-Capillary 54 (L) 65 - 99 mg/dL  Glucose, capillary      Status: Abnormal   Collection Time: 05/02/17 12:12 PM  Result Value Ref Range   Glucose-Capillary 106 (H) 65 - 99 mg/dL  Glucose, capillary     Status: Abnormal   Collection Time: 05/02/17  5:21 PM  Result Value Ref Range   Glucose-Capillary 134 (H) 65 - 99 mg/dL  Glucose, capillary     Status: Abnormal   Collection Time: 05/02/17  9:21 PM  Result Value Ref Range   Glucose-Capillary 156 (H) 65 - 99 mg/dL   Comment 1 Notify RN    Comment 2 Document in Chart   CBC     Status: Abnormal   Collection Time: 05/03/17  4:58 AM  Result Value Ref Range   WBC 7.0 3.6 - 11.0 K/uL   RBC 3.84 3.80 - 5.20 MIL/uL   Hemoglobin 8.6 (L) 12.0 - 16.0 g/dL   HCT 27.9 (L) 35.0 - 47.0 %   MCV 72.6 (L) 80.0 - 100.0 fL   MCH 22.4 (L) 26.0 - 34.0 pg   MCHC 30.9 (L) 32.0 - 36.0 g/dL   RDW 20.9 (H) 11.5 - 14.5 %   Platelets 465 (H) 150 - 440 K/uL  Basic metabolic panel     Status: Abnormal   Collection Time: 05/03/17  4:58 AM  Result Value Ref Range   Sodium 140 135 - 145 mmol/L   Potassium 4.1 3.5 - 5.1 mmol/L   Chloride 97 (L) 101 - 111 mmol/L   CO2 33 (H) 22 - 32 mmol/L   Glucose, Bld 107 (H) 65 - 99 mg/dL   BUN 54 (H) 6 - 20 mg/dL   Creatinine, Ser 1.44 (H) 0.44 - 1.00 mg/dL   Calcium 8.6 (L) 8.9 - 10.3 mg/dL   GFR calc non Af Amer 36 (L) >60 mL/min   GFR calc Af Amer 41 (L) >60 mL/min    Comment: (NOTE) The eGFR has been calculated using the CKD EPI equation. This calculation has not been validated in all clinical situations. eGFR's persistently <60 mL/min signify possible Chronic Kidney Disease.    Anion gap 10 5 -  15  Glucose, capillary     Status: Abnormal   Collection Time: 05/03/17  8:27 AM  Result Value Ref Range   Glucose-Capillary 110 (H) 65 - 99 mg/dL  Glucose, capillary     Status: Abnormal   Collection Time: 05/03/17 11:16 AM  Result Value Ref Range   Glucose-Capillary 172 (H) 65 - 99 mg/dL  Glucose, capillary     Status: Abnormal   Collection Time: 05/03/17  4:39 PM   Result Value Ref Range   Glucose-Capillary 195 (H) 65 - 99 mg/dL  Glucose, capillary     Status: Abnormal   Collection Time: 05/03/17  8:54 PM  Result Value Ref Range   Glucose-Capillary 131 (H) 65 - 99 mg/dL  Glucose, capillary     Status: None   Collection Time: 05/04/17  8:11 AM  Result Value Ref Range   Glucose-Capillary 94 65 - 99 mg/dL  Glucose, capillary     Status: Abnormal   Collection Time: 05/04/17 11:51 AM  Result Value Ref Range   Glucose-Capillary 150 (H) 65 - 99 mg/dL  Glucose, capillary     Status: Abnormal   Collection Time: 05/04/17  5:09 PM  Result Value Ref Range   Glucose-Capillary 147 (H) 65 - 99 mg/dL  Glucose, capillary     Status: Abnormal   Collection Time: 05/04/17  8:55 PM  Result Value Ref Range   Glucose-Capillary 162 (H) 65 - 99 mg/dL  Basic metabolic panel     Status: Abnormal   Collection Time: 05/05/17  5:31 AM  Result Value Ref Range   Sodium 141 135 - 145 mmol/L   Potassium 4.2 3.5 - 5.1 mmol/L   Chloride 100 (L) 101 - 111 mmol/L   CO2 34 (H) 22 - 32 mmol/L   Glucose, Bld 139 (H) 65 - 99 mg/dL   BUN 39 (H) 6 - 20 mg/dL   Creatinine, Ser 1.36 (H) 0.44 - 1.00 mg/dL   Calcium 8.5 (L) 8.9 - 10.3 mg/dL   GFR calc non Af Amer 38 (L) >60 mL/min   GFR calc Af Amer 44 (L) >60 mL/min    Comment: (NOTE) The eGFR has been calculated using the CKD EPI equation. This calculation has not been validated in all clinical situations. eGFR's persistently <60 mL/min signify possible Chronic Kidney Disease.    Anion gap 7 5 - 15  Iron and TIBC     Status: Abnormal   Collection Time: 05/05/17  5:31 AM  Result Value Ref Range   Iron 21 (L) 28 - 170 ug/dL   TIBC 227 (L) 250 - 450 ug/dL   Saturation Ratios 9 (L) 10.4 - 31.8 %   UIBC 206 ug/dL  Ferritin     Status: None   Collection Time: 05/05/17  5:31 AM  Result Value Ref Range   Ferritin 137 11 - 307 ng/mL  Transferrin     Status: Abnormal   Collection Time: 05/05/17  5:31 AM  Result Value Ref  Range   Transferrin 160 (L) 192 - 382 mg/dL    Comment: Performed at [facility], [place] [address] [place], [place] [address]  Glucose, capillary     Status: Abnormal   Collection Time: 05/05/17  7:45 AM  Result Value Ref Range   Glucose-Capillary 125 (H) 65 - 99 mg/dL  Glucose, capillary     Status: Abnormal   Collection Time: 05/05/17 11:35 AM  Result Value Ref Range   Glucose-Capillary 164 (H) 65 - 99 mg/dL  POCT HgB A1C     Status: Normal   Collection Time: 07/29/17  2:26 PM  Result Value Ref Range   Hemoglobin A1C 6.1     Diabetic Foot Exam: Diabetic Foot Exam - Simple   No data filed     ***  PHQ2/9: Depression screen [facility] 2/9 07/29/2017 04/15/2016 11/14/2015 06/26/2015 02/26/2015  Decreased Interest 0 0 0 0 0  Down, Depressed, Hopeless 0 0 0 0 0  PHQ - 2 Score 0 0 0 0 0   ***  Fall Risk: Fall Risk  07/29/2017 04/15/2016 11/14/2015 06/26/2015 02/26/2015  Falls in the past year? Yes No No No No  Number falls in past yr: 1 - - - -  Injury with Fall? No - - - -   ***   Functional Status Survey: Is the patient deaf or have difficulty hearing?: No Does the patient have difficulty seeing, even when wearing glasses/contacts?: No Does the patient have difficulty concentrating, remembering, or making decisions?: No Does the patient have difficulty walking or climbing stairs?: Yes Does the patient have difficulty dressing or bathing?: Yes Does the patient have difficulty doing errands alone such as visiting a doctor's office or shopping?: Yes ***   Assessment & Plan  1. Type 2 diabetes mellitus with diabetic neuropathy, with long-term current use of insulin (HCC) *** - POCT HgB A1C - COMPLETE METABOLIC PANEL WITH GFR  2. Background diabetic retinopathy (HCC) *** - POCT HgB A1C  3. Chronic kidney disease (CKD), stage IV (severe) (HCC) ***  4. Diastolic heart failure, NYHA class 3 (HCC) ***  5. Mild protein-calorie malnutrition (HCC) ***  6. Morbid obesity  due to excess calories (HCC) ***  7. Gastro-esophageal reflux disease without esophagitis ***  8. Obstructive apnea ***  9. Dyslipidemia *** - Lipid panel  10. Benign hypertension with CKD (chronic kidney disease) stage III (HCC) *** - VITAMIN D 25 Hydroxy (Vit-D Deficiency, Fractures) - Phosphorus - Parathyroid hormone, intact (no Ca)  11. 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 Post-menopausal bleeding *** - Ambulatory referral to Gynecology - CBC with Differential/Platelet

## 2017-07-29 NOTE — ED Notes (Signed)
Patient left facility and decided not to wait to be triaged. Instructed them they would be the next to be triaged and they still chose to leave facility.

## 2017-07-30 ENCOUNTER — Telehealth: Payer: Self-pay | Admitting: Obstetrics & Gynecology

## 2017-07-30 DIAGNOSIS — I5032 Chronic diastolic (congestive) heart failure: Secondary | ICD-10-CM | POA: Diagnosis not present

## 2017-07-30 DIAGNOSIS — D62 Acute posthemorrhagic anemia: Secondary | ICD-10-CM | POA: Diagnosis not present

## 2017-07-30 DIAGNOSIS — N950 Postmenopausal bleeding: Secondary | ICD-10-CM | POA: Diagnosis not present

## 2017-07-30 DIAGNOSIS — M25551 Pain in right hip: Secondary | ICD-10-CM | POA: Diagnosis not present

## 2017-07-30 DIAGNOSIS — R531 Weakness: Secondary | ICD-10-CM | POA: Diagnosis not present

## 2017-07-30 LAB — COMPLETE METABOLIC PANEL WITH GFR
AG RATIO: 1.1 (calc) (ref 1.0–2.5)
ALT: 5 U/L — ABNORMAL LOW (ref 6–29)
AST: 7 U/L — AB (ref 10–35)
Albumin: 3.2 g/dL — ABNORMAL LOW (ref 3.6–5.1)
Alkaline phosphatase (APISO): 70 U/L (ref 33–130)
BUN/Creatinine Ratio: 16 (calc) (ref 6–22)
BUN: 25 mg/dL (ref 7–25)
CALCIUM: 8.8 mg/dL (ref 8.6–10.4)
CHLORIDE: 106 mmol/L (ref 98–110)
CO2: 30 mmol/L (ref 20–32)
Creat: 1.61 mg/dL — ABNORMAL HIGH (ref 0.60–0.93)
GFR, EST AFRICAN AMERICAN: 37 mL/min/{1.73_m2} — AB (ref >=60)
GFR, EST NON AFRICAN AMERICAN: 32 mL/min/{1.73_m2} — AB (ref >=60)
Globulin: 2.9 g/dL (calc) (ref 1.9–3.7)
Glucose, Bld: 226 mg/dL — ABNORMAL HIGH (ref 65–99)
POTASSIUM: 4.8 mmol/L (ref 3.5–5.3)
Sodium: 145 mmol/L (ref 135–146)
TOTAL PROTEIN: 6.1 g/dL (ref 6.1–8.1)
Total Bilirubin: 0.3 mg/dL (ref 0.2–1.2)

## 2017-07-30 LAB — CBC WITH DIFFERENTIAL/PLATELET
BASOS PCT: 0.2 %
Basophils Absolute: 18 cells/uL (ref 0–200)
Eosinophils Absolute: 46 cells/uL (ref 15–500)
Eosinophils Relative: 0.5 %
HCT: 19.2 % — ABNORMAL LOW (ref 35.0–45.0)
Hemoglobin: 5.5 g/dL — CL (ref 11.7–15.5)
Lymphs Abs: 1306 cells/uL (ref 850–3900)
MCH: 22.7 pg — ABNORMAL LOW (ref 27.0–33.0)
MCHC: 28.6 g/dL — ABNORMAL LOW (ref 32.0–36.0)
MCV: 79.3 fL — AB (ref 80.0–100.0)
MONOS PCT: 5.9 %
MPV: 10 fL (ref 7.5–12.5)
NEUTROS ABS: 7286 {cells}/uL (ref 1500–7800)
Neutrophils Relative %: 79.2 %
PLATELETS: 288 10*3/uL (ref 140–400)
RBC: 2.42 10*6/uL — AB (ref 3.80–5.10)
RDW: 15.7 % — AB (ref 11.0–15.0)
TOTAL LYMPHOCYTE: 14.2 %
WBC mixed population: 543 cells/uL (ref 200–950)
WBC: 9.2 10*3/uL (ref 3.8–10.8)

## 2017-07-30 LAB — LIPID PANEL
CHOLESTEROL: 113 mg/dL (ref <200)
HDL: 45 mg/dL — ABNORMAL LOW (ref >50)
LDL CHOLESTEROL (CALC): 52 mg/dL
Non-HDL Cholesterol (Calc): 68 mg/dL (calc) (ref <130)
TRIGLYCERIDES: 75 mg/dL (ref <150)
Total CHOL/HDL Ratio: 2.5 (calc) (ref <5.0)

## 2017-07-30 LAB — PHOSPHORUS: PHOSPHORUS: 3.4 mg/dL (ref 2.1–4.3)

## 2017-07-30 LAB — VITAMIN D 25 HYDROXY (VIT D DEFICIENCY, FRACTURES): Vit D, 25-Hydroxy: 48 ng/mL (ref 30–100)

## 2017-07-30 LAB — PARATHYROID HORMONE, INTACT (NO CA): PTH: 50 pg/mL (ref 14–64)

## 2017-07-30 MED ORDER — MONTELUKAST SODIUM 10 MG PO TABS
10.00 mg | ORAL_TABLET | ORAL | Status: DC
Start: 2017-07-30 — End: 2017-07-30

## 2017-07-30 MED ORDER — INSULIN LISPRO 100 UNIT/ML ~~LOC~~ SOLN
0.00 | SUBCUTANEOUS | Status: DC
Start: 2017-07-30 — End: 2017-07-30

## 2017-07-30 MED ORDER — INSULIN LISPRO 100 UNIT/ML ~~LOC~~ SOLN
5.00 | SUBCUTANEOUS | Status: DC
Start: 2017-07-30 — End: 2017-07-30

## 2017-07-30 MED ORDER — GABAPENTIN 300 MG PO CAPS
300.00 mg | ORAL_CAPSULE | ORAL | Status: DC
Start: 2017-07-30 — End: 2017-07-30

## 2017-07-30 MED ORDER — FAMOTIDINE 20 MG PO TABS
20.00 mg | ORAL_TABLET | ORAL | Status: DC
Start: 2017-07-31 — End: 2017-07-30

## 2017-07-30 MED ORDER — PRAVASTATIN SODIUM 40 MG PO TABS
40.00 mg | ORAL_TABLET | ORAL | Status: DC
Start: 2017-07-30 — End: 2017-07-30

## 2017-07-30 MED ORDER — MEDROXYPROGESTERONE ACETATE 10 MG PO TABS
10.00 mg | ORAL_TABLET | ORAL | Status: DC
Start: 2017-07-31 — End: 2017-07-30

## 2017-07-30 MED ORDER — FUROSEMIDE 40 MG PO TABS
40.00 mg | ORAL_TABLET | ORAL | Status: DC
Start: 2017-07-31 — End: 2017-07-30

## 2017-07-30 MED ORDER — LIDOCAINE 5 % EX PTCH
1.00 | MEDICATED_PATCH | CUTANEOUS | Status: DC
Start: 2017-07-31 — End: 2017-07-30

## 2017-07-30 MED ORDER — ACETAMINOPHEN 325 MG PO TABS
650.00 mg | ORAL_TABLET | ORAL | Status: DC
Start: ? — End: 2017-07-30

## 2017-07-30 MED ORDER — METOPROLOL TARTRATE 50 MG PO TABS
100.00 mg | ORAL_TABLET | ORAL | Status: DC
Start: 2017-07-30 — End: 2017-07-30

## 2017-07-30 MED ORDER — CLONIDINE 0.3 MG/24HR TD PTWK
1.00 | MEDICATED_PATCH | TRANSDERMAL | Status: DC
Start: 2017-08-06 — End: 2017-07-30

## 2017-07-30 MED ORDER — GENERIC EXTERNAL MEDICATION
30.00 | Status: DC
Start: ? — End: 2017-07-30

## 2017-07-30 MED ORDER — AMLODIPINE BESYLATE 10 MG PO TABS
10.00 mg | ORAL_TABLET | ORAL | Status: DC
Start: 2017-07-31 — End: 2017-07-30

## 2017-07-30 MED ORDER — ONDANSETRON 4 MG PO TBDP
4.00 mg | ORAL_TABLET | ORAL | Status: DC
Start: ? — End: 2017-07-30

## 2017-07-30 MED ORDER — HYDRALAZINE HCL 25 MG PO TABS
50.00 mg | ORAL_TABLET | ORAL | Status: DC
Start: 2017-07-30 — End: 2017-07-30

## 2017-07-30 MED ORDER — INFLUENZA VAC SPLIT QUAD 0.5 ML IM SUSY
0.50 | PREFILLED_SYRINGE | INTRAMUSCULAR | Status: DC
Start: ? — End: 2017-07-30

## 2017-07-30 MED ORDER — DEXTROSE 50 % IV SOLN
12.50 | INTRAVENOUS | Status: DC
Start: ? — End: 2017-07-30

## 2017-07-30 MED ORDER — GENERIC EXTERNAL MEDICATION
35.00 | Status: DC
Start: 2017-07-30 — End: 2017-07-30

## 2017-07-30 NOTE — Telephone Encounter (Signed)
[facility] referring for Post-menopausal bleeding. Called and spoke with family member patient is currently at [facility] hospital due to bleeding. Advised to give [place] a call back for [place] to schedule follow up on referral

## 2017-07-30 NOTE — Progress Notes (Signed)
Name: Veronica Anderson   MRN: [id number]    DOB: [birth year]   Date:07/30/2017       Progress Note  Subjective  Chief Complaint  Chief Complaint  Patient presents with  . Medication Refill    7 month F/U  . Diabetes    Checks twice daily and states her BS has been low  . Hypertension    Denies any symptoms  . Chronic Kidney Disease  . Sleep Apnea  . Gastroesophageal Reflux    Ran out of medication  . Vaginal Bleeding  . Allergic Rhinitis     Runny nose and coughing-start Coricidin  . Shortness of Breath    When to hospital was ICU for 14 days. Started her back on Hydralazine on a higher dose.    HPI  DMII: unable to stands to get weight checked today. She is following a diabetic diet and glucose has been at goal,down to 28 units of Lantus and no longer has hypoglycemia. She has been using Humalog 5 units before meals. She went for an eye exam and had surgery, seeing better. she states neuropathy is under control with Gabapentin - no pain, but still had decrease in sensation of both feet.she also has CKI and is not on ACE because of history of allergy.   HTN: she has been taking medication as prescribed, no chest pain or palpitation. Complaint with medication and denies side effects.   Hyperlipidemia: she has been out of medication for the past week.   CKI: she was seen by nephrologist while hospitalized 04/2017 but lost to follow up since. We will recheck labs  History of blood transfusion, hgb was down to 6 during hospital stay, no labs since discharge  OSA: uses CPAP every night, all night and is having supplemental oxygen 2 liters through the CPAP machine. She has a rash on her face from the mask.  CHF: diastolic, she still has orthopnea, uses 3 pillows at night, using CPAP,mildleg swelling, states she has SOB with activity. On beta-blocker, hydralazine, not on ACE/ARB- she is not sure if she had angioedema, but afraid to try it. She was hospitalized 04/2017 had  to go to ICU and sent home upon discharge, she did not follow up in our office, with cardiologist or nephrologist. Discussed with her son [name]) the importance of hospital follow ups  GERD: under control, she is on Omeprazole and discussed long term risk of medication, advised to wean self off.   Post-menopausal bleeding: she states since last week she has noticed vaginal bleeding, no previous history of vaginal bleeding, no rectal bleeding, stools are normal, no nausea, vomiting, she denies abdominal cramping, but has pain on lateral thighs since bleeding started. She may go to [facility] if it gets worse  Patient Active Problem List   Diagnosis Date Noted  . Chronic kidney disease (CKD), stage IV (severe) ([place]) 07/29/2017  . Mild protein-calorie malnutrition ([place]) 07/29/2017  . Anemia due to chronic kidney disease   . Prolonged Q-T interval on ECG 12/23/2016  . CHF (congestive heart failure), NYHA class I ([place]) 11/07/2016  . Nocturnal oxygen desaturation 06/26/2015  . Anemia in chronic illness 02/23/2015  . Asthma, mild intermittent 02/23/2015  . Benign essential HTN 02/23/2015  . Diastolic heart failure, NYHA class 3 ([place]) 02/23/2015  . Benign hypertension with chronic kidney disease, stage III ([place]) 02/23/2015  . CN (constipation) 02/23/2015  . Diabetes mellitus with neuropathy ([place]) 02/23/2015  . Dyslipidemia 02/23/2015  . Elevated ferritin 02/23/2015  . Gastro-esophageal  reflux disease without esophagitis 02/23/2015  . LBP (low back pain) 02/23/2015  . Morbid obesity due to excess calories ([place]) 02/23/2015  . Obstructive apnea 02/23/2015  . Osteoarthritis 02/23/2015  . Neuropathy 02/23/2015  . Perennial allergic rhinitis with seasonal variation 02/23/2015  . History of pneumonia 02/23/2015  . Vitamin D deficiency 02/23/2015  . Cataract 04/16/2010  . Background diabetic retinopathy ([place]) 12/18/2009    Social History   Tobacco Use  . Smoking status: Former Smoker    Types:  Cigarettes    Last attempt to quit: 07/14/1994    Years since quitting: 23.0  . Smokeless tobacco: Never Used  Substance Use Topics  . Alcohol use: No    Alcohol/week: 0.0 oz     Current Outpatient Medications:  .  ACCU-CHEK AVIVA PLUS test strip, CHECK BLOOD SUGAR TWICE DAILY, Disp: 200 each, Rfl: 3 .  amLODipine (NORVASC) 10 MG tablet, TAKE 1 TABLET (10 MG TOTAL) BY MOUTH DAILY., Disp: 90 tablet, Rfl: 1 .  aspirin EC 81 MG tablet, Take 81 mg by mouth at bedtime., Disp: , Rfl:  .  cloNIDine (CATAPRES - DOSED IN MG/24 HR) 0.3 mg/24hr patch, Place 1 patch onto the skin once a week., Disp: , Rfl:  .  desonide (DESOWEN) 0.05 % ointment, Apply 1 application topically 2 (two) times daily., Disp: 60 g, Rfl: 0 .  furosemide (LASIX) 40 MG tablet, Take 1 tablet (40 mg total) by mouth daily., Disp: 30 tablet, Rfl: 1 .  gabapentin (NEURONTIN) 300 MG capsule, TAKE 1 CAPSULE (300 MG TOTAL) BY MOUTH 3 (THREE) TIMES DAILY., Disp: 270 capsule, Rfl: 2 .  hydrALAZINE (APRESOLINE) 50 MG tablet, Take 1 tablet (50 mg total) 3 (three) times daily by mouth., Disp: 90 tablet, Rfl: 1 .  Insulin Glargine (LANTUS SOLOSTAR) 100 UNIT/ML Solostar Pen, INJECT 35 UNITS SUBCUTANEOUSLY DAILY, Disp: 15 pen, Rfl: 1 .  insulin lispro (HUMALOG KWIKPEN) 100 UNIT/ML KiwkPen, INJECT 5 TO 10 UNITS SUBCUTANEOUSLY BEFORE MEALS (Patient taking differently: Inject 5 Units into the skin 3 (three) times daily. ), Disp: 6 mL, Rfl: 5 .  metoprolol (LOPRESSOR) 100 MG tablet, Take 1 tablet (100 mg total) by mouth 2 (two) times daily., Disp: 180 tablet, Rfl: 2 .  montelukast (SINGULAIR) 10 MG tablet, Take 1 tablet (10 mg total) daily by mouth., Disp: 90 tablet, Rfl: 2 .  NOVOFINE 32G X 6 MM MISC, USE 4 TIMES A DAY AS DIRECTED, Disp: 100 each, Rfl: 5 .  nystatin (NYSTATIN) powder, Apply 1 g topically 3 (three) times daily., Disp: 45 g, Rfl: 1 .  pravastatin (PRAVACHOL) 40 MG tablet, TAKE 1 TABLET BY MOUTH ONCE A DAY FOR CHOLESTEROL, Disp: 90  tablet, Rfl: 2 .  ranitidine (ZANTAC) 150 MG tablet, TAKE 1 TABLET (150 MG TOTAL) BY MOUTH 2 (TWO) TIMES DAILY. TRYING TO WEAN OFF OMEPRAZOLE, Disp: 180 tablet, Rfl: 1 .  Vitamin D, Ergocalciferol, (DRISDOL) 50000 units CAPS capsule, TAKE ONE CAPSULE BY MOUTH ONCE A WEEK, Disp: 4 capsule, Rfl: 1 .  loratadine (CLARITIN) 10 MG tablet, TAKE 1 TABLET BY MOUTH EVERY DAY FOR ALLERGIES (Patient not taking: Reported on 07/29/2017), Disp: 30 tablet, Rfl: 5  Allergies  Allergen Reactions  . Ace Inhibitors Cough    ROS  Constitutional: Negative for fever , unknown if weight change - unable to stand at scale.  Respiratory: Negative for cough , no shortness of breath - but also not moving much - sitting on wheelchair    Cardiovascular: Negative for chest  pain or palpitations.  Gastrointestinal: Negative for abdominal pain, no bowel changes.  Musculoskeletal: Positive  for gait problem ( feeling weak since hospital discharge Fall 2018)  or joint swelling.  Skin: Negative for rash.  Neurological: Negative for dizziness or headache.  No other specific complaints in a complete review of systems (except as listed in HPI above).  Objective  Vitals:   07/29/17 1358  BP: (!) 146/62  Pulse: 92  Resp: 16  Temp: 98.3 F (36.8 C)  TempSrc: Oral  SpO2: 94%  Height: 5' 4"  (1.626 m)    Body mass index is 45.26 kg/m.    Physical Exam   Constitutional: Patient appears well-developed and pale. Obese No distress.  HEENT: head atraumatic, normocephalic, pupils equal and reactive to light,  neck supple, throat within normal limits Cardiovascular: Normal rate, regular rhythm and normal heart sounds.  No murmur heard. Trace  BLE edema. Pulmonary/Chest: Effort normal and breath sounds normal. No respiratory distress. Abdominal: Soft.  There is no tenderness. Psychiatric: Patient has a normal mood and affect. behavior is normal. Judgment and thought content normal.  Recent Results (from the past 2160  hour(s))  Glucose, capillary     Status: Abnormal   Collection Time: 05/01/17 11:28 AM  Result Value Ref Range   Glucose-Capillary 174 (H) 65 - 99 mg/dL  Glucose, capillary     Status: Abnormal   Collection Time: 05/01/17  5:12 PM  Result Value Ref Range   Glucose-Capillary 110 (H) 65 - 99 mg/dL  Glucose, capillary     Status: Abnormal   Collection Time: 05/01/17 10:26 PM  Result Value Ref Range   Glucose-Capillary 107 (H) 65 - 99 mg/dL  Basic metabolic panel     Status: Abnormal   Collection Time: 05/02/17  5:59 AM  Result Value Ref Range   Sodium 145 135 - 145 mmol/L   Potassium 3.4 (L) 3.5 - 5.1 mmol/L   Chloride 99 (L) 101 - 111 mmol/L   CO2 35 (H) 22 - 32 mmol/L   Glucose, Bld 56 (L) 65 - 99 mg/dL   BUN 55 (H) 6 - 20 mg/dL   Creatinine, Ser 1.37 (H) 0.44 - 1.00 mg/dL   Calcium 8.6 (L) 8.9 - 10.3 mg/dL   GFR calc non Af Amer 38 (L) >60 mL/min   GFR calc Af Amer 44 (L) >60 mL/min    Comment: (NOTE) The eGFR has been calculated using the CKD EPI equation. This calculation has not been validated in all clinical situations. eGFR's persistently <60 mL/min signify possible Chronic Kidney Disease.    Anion gap 11 5 - 15  CBC     Status: Abnormal   Collection Time: 05/02/17  5:59 AM  Result Value Ref Range   WBC 7.3 3.6 - 11.0 K/uL   RBC 3.76 (L) 3.80 - 5.20 MIL/uL   Hemoglobin 8.6 (L) 12.0 - 16.0 g/dL   HCT 27.4 (L) 35.0 - 47.0 %   MCV 72.8 (L) 80.0 - 100.0 fL   MCH 23.0 (L) 26.0 - 34.0 pg   MCHC 31.6 (L) 32.0 - 36.0 g/dL   RDW 21.2 (H) 11.5 - 14.5 %   Platelets 429 150 - 440 K/uL  Magnesium     Status: None   Collection Time: 05/02/17  5:59 AM  Result Value Ref Range   Magnesium 2.2 1.7 - 2.4 mg/dL  Phosphorus     Status: None   Collection Time: 05/02/17  5:59 AM  Result Value Ref Range  Phosphorus 3.8 2.5 - 4.6 mg/dL  Brain natriuretic peptide     Status: None   Collection Time: 05/02/17  5:59 AM  Result Value Ref Range   B Natriuretic Peptide 100.0 0.0 - 100.0  pg/mL  Glucose, capillary     Status: Abnormal   Collection Time: 05/02/17  8:00 AM  Result Value Ref Range   Glucose-Capillary 54 (L) 65 - 99 mg/dL  Glucose, capillary     Status: Abnormal   Collection Time: 05/02/17 12:12 PM  Result Value Ref Range   Glucose-Capillary 106 (H) 65 - 99 mg/dL  Glucose, capillary     Status: Abnormal   Collection Time: 05/02/17  5:21 PM  Result Value Ref Range   Glucose-Capillary 134 (H) 65 - 99 mg/dL  Glucose, capillary     Status: Abnormal   Collection Time: 05/02/17  9:21 PM  Result Value Ref Range   Glucose-Capillary 156 (H) 65 - 99 mg/dL   Comment 1 Notify RN    Comment 2 Document in Chart   CBC     Status: Abnormal   Collection Time: 05/03/17  4:58 AM  Result Value Ref Range   WBC 7.0 3.6 - 11.0 K/uL   RBC 3.84 3.80 - 5.20 MIL/uL   Hemoglobin 8.6 (L) 12.0 - 16.0 g/dL   HCT 27.9 (L) 35.0 - 47.0 %   MCV 72.6 (L) 80.0 - 100.0 fL   MCH 22.4 (L) 26.0 - 34.0 pg   MCHC 30.9 (L) 32.0 - 36.0 g/dL   RDW 20.9 (H) 11.5 - 14.5 %   Platelets 465 (H) 150 - 440 K/uL  Basic metabolic panel     Status: Abnormal   Collection Time: 05/03/17  4:58 AM  Result Value Ref Range   Sodium 140 135 - 145 mmol/L   Potassium 4.1 3.5 - 5.1 mmol/L   Chloride 97 (L) 101 - 111 mmol/L   CO2 33 (H) 22 - 32 mmol/L   Glucose, Bld 107 (H) 65 - 99 mg/dL   BUN 54 (H) 6 - 20 mg/dL   Creatinine, Ser 1.44 (H) 0.44 - 1.00 mg/dL   Calcium 8.6 (L) 8.9 - 10.3 mg/dL   GFR calc non Af Amer 36 (L) >60 mL/min   GFR calc Af Amer 41 (L) >60 mL/min    Comment: (NOTE) The eGFR has been calculated using the CKD EPI equation. This calculation has not been validated in all clinical situations. eGFR's persistently <60 mL/min signify possible Chronic Kidney Disease.    Anion gap 10 5 - 15  Glucose, capillary     Status: Abnormal   Collection Time: 05/03/17  8:27 AM  Result Value Ref Range   Glucose-Capillary 110 (H) 65 - 99 mg/dL  Glucose, capillary     Status: Abnormal   Collection  Time: 05/03/17 11:16 AM  Result Value Ref Range   Glucose-Capillary 172 (H) 65 - 99 mg/dL  Glucose, capillary     Status: Abnormal   Collection Time: 05/03/17  4:39 PM  Result Value Ref Range   Glucose-Capillary 195 (H) 65 - 99 mg/dL  Glucose, capillary     Status: Abnormal   Collection Time: 05/03/17  8:54 PM  Result Value Ref Range   Glucose-Capillary 131 (H) 65 - 99 mg/dL  Glucose, capillary     Status: None   Collection Time: 05/04/17  8:11 AM  Result Value Ref Range   Glucose-Capillary 94 65 - 99 mg/dL  Glucose, capillary     Status: Abnormal  Collection Time: 05/04/17 11:51 AM  Result Value Ref Range   Glucose-Capillary 150 (H) 65 - 99 mg/dL  Glucose, capillary     Status: Abnormal   Collection Time: 05/04/17  5:09 PM  Result Value Ref Range   Glucose-Capillary 147 (H) 65 - 99 mg/dL  Glucose, capillary     Status: Abnormal   Collection Time: 05/04/17  8:55 PM  Result Value Ref Range   Glucose-Capillary 162 (H) 65 - 99 mg/dL  Basic metabolic panel     Status: Abnormal   Collection Time: 05/05/17  5:31 AM  Result Value Ref Range   Sodium 141 135 - 145 mmol/L   Potassium 4.2 3.5 - 5.1 mmol/L   Chloride 100 (L) 101 - 111 mmol/L   CO2 34 (H) 22 - 32 mmol/L   Glucose, Bld 139 (H) 65 - 99 mg/dL   BUN 39 (H) 6 - 20 mg/dL   Creatinine, Ser 1.36 (H) 0.44 - 1.00 mg/dL   Calcium 8.5 (L) 8.9 - 10.3 mg/dL   GFR calc non Af Amer 38 (L) >60 mL/min   GFR calc Af Amer 44 (L) >60 mL/min    Comment: (NOTE) The eGFR has been calculated using the CKD EPI equation. This calculation has not been validated in all clinical situations. eGFR's persistently <60 mL/min signify possible Chronic Kidney Disease.    Anion gap 7 5 - 15  Iron and TIBC     Status: Abnormal   Collection Time: 05/05/17  5:31 AM  Result Value Ref Range   Iron 21 (L) 28 - 170 ug/dL   TIBC 227 (L) 250 - 450 ug/dL   Saturation Ratios 9 (L) 10.4 - 31.8 %   UIBC 206 ug/dL  Ferritin     Status: None   Collection  Time: 05/05/17  5:31 AM  Result Value Ref Range   Ferritin 137 11 - 307 ng/mL  Transferrin     Status: Abnormal   Collection Time: 05/05/17  5:31 AM  Result Value Ref Range   Transferrin 160 (L) 192 - 382 mg/dL    Comment: Performed at [facility], [place] [address], [place], [place] [address]  Glucose, capillary     Status: Abnormal   Collection Time: 05/05/17  7:45 AM  Result Value Ref Range   Glucose-Capillary 125 (H) 65 - 99 mg/dL  Glucose, capillary     Status: Abnormal   Collection Time: 05/05/17 11:35 AM  Result Value Ref Range   Glucose-Capillary 164 (H) 65 - 99 mg/dL  POCT HgB A1C     Status: Normal   Collection Time: 07/29/17  2:26 PM  Result Value Ref Range   Hemoglobin A1C 6.1   CBC with Differential/Platelet     Status: Abnormal   Collection Time: 07/29/17  3:00 PM  Result Value Ref Range   WBC 9.2 3.8 - 10.8 Thousand/uL   RBC 2.42 (L) 3.80 - 5.10 Million/uL   Hemoglobin 5.5 (LL) 11.7 - 15.5 g/dL    Comment: Verified by repeat analysis. Marland Kitchen    HCT 19.2 (L) 35.0 - 45.0 %   MCV 79.3 (L) 80.0 - 100.0 fL   MCH 22.7 (L) 27.0 - 33.0 pg   MCHC 28.6 (L) 32.0 - 36.0 g/dL    Comment: Verified by repeat analysis. .    RDW 15.7 (H) 11.0 - 15.0 %   Platelets 288 140 - 400 Thousand/uL   MPV 10.0 7.5 - 12.5 fL   Neutro Abs 7,286 1,500 -  7,800 cells/uL   Lymphs Abs 1,306 850 - 3,900 cells/uL   WBC mixed population 543 200 - 950 cells/uL   Eosinophils Absolute 46 15 - 500 cells/uL   Basophils Absolute 18 0 - 200 cells/uL   Neutrophils Relative % 79.2 %   Total Lymphocyte 14.2 %   Monocytes Relative 5.9 %   Eosinophils Relative 0.5 %   Basophils Relative 0.2 %   Smear Review      Comment: Review of peripheral smear confirms automated results.   COMPLETE METABOLIC PANEL WITH GFR     Status: Abnormal   Collection Time: 07/29/17  3:00 PM  Result Value Ref Range   Glucose, Bld 226 (H) 65 - 99 mg/dL    Comment: .            Fasting reference interval . For someone  without known diabetes, a glucose value >125 mg/dL indicates that they may have diabetes and this should be confirmed with a follow-up test. .    BUN 25 7 - 25 mg/dL   Creat 1.61 (H) 0.60 - 0.93 mg/dL    Comment: For patients >57 years of age, the reference limit for Creatinine is approximately 13% higher for people identified as African-American. .    GFR, Est Non African American 32 (L) > OR = 60 mL/min/1.57m   GFR, Est African American 37 (L) > OR = 60 mL/min/1.76m  BUN/Creatinine Ratio 16 6 - 22 (calc)   Sodium 145 135 - 146 mmol/L   Potassium 4.8 3.5 - 5.3 mmol/L   Chloride 106 98 - 110 mmol/L   CO2 30 20 - 32 mmol/L   Calcium 8.8 8.6 - 10.4 mg/dL   Total Protein 6.1 6.1 - 8.1 g/dL   Albumin 3.2 (L) 3.6 - 5.1 g/dL   Globulin 2.9 1.9 - 3.7 g/dL (calc)   AG Ratio 1.1 1.0 - 2.5 (calc)   Total Bilirubin 0.3 0.2 - 1.2 mg/dL   Alkaline phosphatase (APISO) 70 33 - 130 U/L   AST 7 (L) 10 - 35 U/L   ALT 5 (L) 6 - 29 U/L  VITAMIN D 25 Hydroxy (Vit-D Deficiency, Fractures)     Status: None   Collection Time: 07/29/17  3:00 PM  Result Value Ref Range   Vit D, 25-Hydroxy 48 30 - 100 ng/mL    Comment: Vitamin D Status         25-OH Vitamin D: . Deficiency:                    <20 ng/mL Insufficiency:             20 - 29 ng/mL Optimal:                 > or = 30 ng/mL . For 25-OH Vitamin D testing on patients on  D2-supplementation and patients for whom quantitation  of D2 and D3 fractions is required, the QuestAssureD(TM) 25-OH VIT D, (D2,D3), LC/MS/MS is recommended: order  code [id number] >2y69yr . For more information on this test, go to: http://education.questdiagnostics.com/faq/FAQ163 (This link is being provided for  informational/educational purposes only.)   Phosphorus     Status: None   Collection Time: 07/29/17  3:00 PM  Result Value Ref Range   Phosphorus 3.4 2.1 - 4.3 mg/dL  Lipid panel     Status: Abnormal   Collection Time: 07/29/17  3:00 PM  Result  Value Ref Range   Cholesterol 113 <200 mg/dL  HDL 45 (L) >50 mg/dL   Triglycerides 75 <150 mg/dL   LDL Cholesterol (Calc) 52 mg/dL (calc)    Comment: Reference range: <100 . Desirable range <100 mg/dL for primary prevention;   <70 mg/dL for patients with CHD or diabetic patients  with > or = 2 CHD risk factors. Marland Kitchen LDL-C is now calculated using the Martin-Hopkins  calculation, which is a validated novel method providing  better accuracy than the Friedewald equation in the  estimation of LDL-C.  Cresenciano Genre et al. Annamaria Helling. 5885;027(74): 2061-2068  (http://education.[facility].com/faq/FAQ164)    Total CHOL/HDL Ratio 2.5 <5.0 (calc)   Non-HDL Cholesterol (Calc) 68 <130 mg/dL (calc)    Comment: For patients with diabetes plus 1 major ASCVD risk  factor, treating to a non-HDL-C goal of <100 mg/dL  (LDL-C of <70 mg/dL) is considered a therapeutic  option.      Assessment & Plan  1. Type 2 diabetes mellitus with diabetic neuropathy, with long-term current use of insulin (HCC)  Stable, discussed going down on Lantus since hgba1C is at goal, needs to check glucose multiple times daily - POCT HgB A1C - COMPLETE METABOLIC PANEL WITH GFR  2. Background diabetic retinopathy ([place])  Reminded her of yearly eye exam  - POCT HgB A1C  3. Chronic kidney disease (CKD), stage IV (severe) (HCC)  Lost to follow up with nephrologist, also did not return for hospital follow up  4. Diastolic heart failure, NYHA class 3 ([place])  Explained importance of regular visits and hospital follow ups, she also needs to follow up with cardiologist   5. Mild protein-calorie malnutrition ([place])  Recheck level   6. Morbid obesity due to excess calories ([place])  Advised to eat more protein if tolerated  7. Gastro-esophageal reflux disease without esophagitis  Denies epigastric pain  8. Obstructive apnea  Continue CPAP   9. Dyslipidemia  - Lipid panel  10. Benign hypertension with CKD (chronic  kidney disease) stage III (HCC)  - VITAMIN D 25 Hydroxy (Vit-D Deficiency, Fractures) - Phosphorus - Parathyroid hormone, intact (no Ca)  11. 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 Post-menopausal bleeding  - Ambulatory referral to Gynecology - CBC with Differential/Platelet Explained that if bleeding gets heavier go to [facility]  12. Vitamin D deficiency  Recheck level

## 2017-07-31 DIAGNOSIS — R531 Weakness: Secondary | ICD-10-CM | POA: Diagnosis not present

## 2017-07-31 DIAGNOSIS — K219 Gastro-esophageal reflux disease without esophagitis: Secondary | ICD-10-CM | POA: Diagnosis not present

## 2017-07-31 DIAGNOSIS — Z794 Long term (current) use of insulin: Secondary | ICD-10-CM | POA: Diagnosis not present

## 2017-07-31 DIAGNOSIS — I130 Hypertensive heart and chronic kidney disease with heart failure and stage 1 through stage 4 chronic kidney disease, or unspecified chronic kidney disease: Secondary | ICD-10-CM | POA: Diagnosis not present

## 2017-07-31 DIAGNOSIS — M1990 Unspecified osteoarthritis, unspecified site: Secondary | ICD-10-CM | POA: Diagnosis not present

## 2017-07-31 DIAGNOSIS — N189 Chronic kidney disease, unspecified: Secondary | ICD-10-CM | POA: Diagnosis not present

## 2017-07-31 DIAGNOSIS — N950 Postmenopausal bleeding: Secondary | ICD-10-CM | POA: Diagnosis not present

## 2017-07-31 DIAGNOSIS — G8929 Other chronic pain: Secondary | ICD-10-CM | POA: Diagnosis not present

## 2017-07-31 DIAGNOSIS — Z7982 Long term (current) use of aspirin: Secondary | ICD-10-CM | POA: Diagnosis not present

## 2017-07-31 DIAGNOSIS — Z87891 Personal history of nicotine dependence: Secondary | ICD-10-CM | POA: Diagnosis not present

## 2017-07-31 DIAGNOSIS — G4733 Obstructive sleep apnea (adult) (pediatric): Secondary | ICD-10-CM | POA: Diagnosis not present

## 2017-07-31 DIAGNOSIS — Z8673 Personal history of transient ischemic attack (TIA), and cerebral infarction without residual deficits: Secondary | ICD-10-CM | POA: Diagnosis not present

## 2017-07-31 DIAGNOSIS — E1122 Type 2 diabetes mellitus with diabetic chronic kidney disease: Secondary | ICD-10-CM | POA: Diagnosis not present

## 2017-07-31 DIAGNOSIS — I5032 Chronic diastolic (congestive) heart failure: Secondary | ICD-10-CM | POA: Diagnosis not present

## 2017-08-01 ENCOUNTER — Telehealth: Payer: Self-pay | Admitting: Family Medicine

## 2017-08-01 DIAGNOSIS — I129 Hypertensive chronic kidney disease with stage 1 through stage 4 chronic kidney disease, or unspecified chronic kidney disease: Secondary | ICD-10-CM

## 2017-08-01 DIAGNOSIS — N183 Chronic kidney disease, stage 3 unspecified: Secondary | ICD-10-CM

## 2017-08-01 DIAGNOSIS — E785 Hyperlipidemia, unspecified: Secondary | ICD-10-CM

## 2017-08-02 ENCOUNTER — Other Ambulatory Visit: Payer: Self-pay | Admitting: Family Medicine

## 2017-08-02 DIAGNOSIS — E559 Vitamin D deficiency, unspecified: Secondary | ICD-10-CM

## 2017-08-03 ENCOUNTER — Telehealth: Payer: Self-pay

## 2017-08-03 NOTE — Telephone Encounter (Signed)
Refill request for Hypertension medication:  Metoprolol 100 mg, Lasix 40 mg  Last office visit pertaining to hypertension: 07/29/2017  Follow up appointment:  08/31/2017  BP Readings from Last 3 Encounters:  07/29/17 (!) 146/62  05/05/17 (!) 154/53  12/23/16 132/68     Lab Results  Component Value Date   CREATININE 1.61 (H) 07/29/2017   BUN 25 07/29/2017   NA 145 07/29/2017   K 4.8 07/29/2017   CL 106 07/29/2017   CO2 30 07/29/2017   Refill Request for Cholesterol medication. Pravastain 40 mg  Last physical: Not indicated  Lab Results  Component Value Date   CHOL 113 07/29/2017   HDL 45 (L) 07/29/2017   LDLCALC 80 08/21/2016   TRIG 75 07/29/2017   CHOLHDL 2.5 07/29/2017

## 2017-08-03 NOTE — Telephone Encounter (Signed)
A referral was made for patient for home health to assess her needs but when [name] from [facility] when to the house they refused her care. Patient states she is on birth control to help with bleeding and doing well. She refused any home health care help.

## 2017-08-04 ENCOUNTER — Telehealth: Payer: Self-pay | Admitting: Obstetrics and Gynecology

## 2017-08-04 NOTE — Telephone Encounter (Signed)
Patient states she does not want home health in her house and is doing fine. She is having help with controlling her bleeding from the birth control that [facility] put her on.

## 2017-08-04 NOTE — Telephone Encounter (Signed)
Pt called to check on status of refills. PT states she has been on contact with pharmacy and they have not received rx from the office.

## 2017-08-04 NOTE — Telephone Encounter (Signed)
[facility] medical referring Post-menopausal bleeding. Lvm for patient to call back to be schedule

## 2017-08-04 NOTE — Telephone Encounter (Signed)
Can you ask her why?

## 2017-08-05 ENCOUNTER — Other Ambulatory Visit: Payer: Self-pay | Admitting: Family Medicine

## 2017-08-05 NOTE — Telephone Encounter (Signed)
Called [facility] they received all the prescriptions on 08/03/16 and medications are ready for her to pick up.

## 2017-08-05 NOTE — Telephone Encounter (Signed)
Refill request for diabetic medication:   Accu-chek aviva  Last office visit pertaining to diabetes: 07/29/2017   Lab Results  Component Value Date   HGBA1C 6.1 07/29/2017     Follow-up on file. 08/31/2017

## 2017-08-06 ENCOUNTER — Other Ambulatory Visit: Payer: Self-pay

## 2017-08-06 MED ORDER — GLUCOSE BLOOD VI STRP: 1.0000 | ORAL_STRIP | Freq: Two times a day (BID) | 3 refills | Status: AC

## 2017-08-06 NOTE — Telephone Encounter (Signed)
Spoke with Mrs. Laurance Flatten and she states her son was calling regarding needing her test strips for her meter. Please send to [facility].

## 2017-08-06 NOTE — Telephone Encounter (Signed)
Copied from [place]. Topic: General - Call Back - No Documentation >> Aug 05, 2017  4:19 PM Arletha Grippe wrote: Reason for CRM: son is returning a call, his call was dropped. Please call [phone]

## 2017-08-10 DIAGNOSIS — I509 Heart failure, unspecified: Secondary | ICD-10-CM | POA: Diagnosis not present

## 2017-08-19 ENCOUNTER — Ambulatory Visit: Payer: Medicare Other | Admitting: Family Medicine

## 2017-08-20 ENCOUNTER — Other Ambulatory Visit: Payer: Self-pay

## 2017-08-20 DIAGNOSIS — K219 Gastro-esophageal reflux disease without esophagitis: Secondary | ICD-10-CM

## 2017-08-20 MED ORDER — RANITIDINE HCL 150 MG PO TABS: 150.0000 mg | ORAL_TABLET | Freq: Two times a day (BID) | ORAL | 1 refills | Status: DC

## 2017-08-20 NOTE — Telephone Encounter (Signed)
Refill request for general medication: Ranitidine 150 mg  Last office visit: 07/29/2017  Last physical exam: None indicated  Follow-up on file. 08/31/2017

## 2017-08-21 ENCOUNTER — Other Ambulatory Visit: Payer: Self-pay | Admitting: Family Medicine

## 2017-08-21 DIAGNOSIS — N183 Chronic kidney disease, stage 3 (moderate): Principal | ICD-10-CM

## 2017-08-21 DIAGNOSIS — I129 Hypertensive chronic kidney disease with stage 1 through stage 4 chronic kidney disease, or unspecified chronic kidney disease: Secondary | ICD-10-CM

## 2017-08-21 NOTE — Telephone Encounter (Signed)
Requesting [facility]mlodopine to [facility] with a 90 day supply.

## 2017-08-21 NOTE — Telephone Encounter (Signed)
Refill request for Hypertension medication:  Metoprolol 100 mg  Last office visit pertaining to hypertension: 07/29/2017  BP Readings from Last 3 Encounters:  07/29/17 (!) 146/62  05/05/17 (!) 154/53  12/23/16 132/68     Lab Results  Component Value Date   CREATININE 1.61 (H) 07/29/2017   BUN 25 07/29/2017   NA 145 07/29/2017   K 4.8 07/29/2017   CL 106 07/29/2017   CO2 30 07/29/2017   Follow-up on file. 08/31/2017

## 2017-08-24 MED ORDER — AMLODIPINE BESYLATE 10 MG PO TABS: 10.0000 mg | ORAL_TABLET | Freq: Every day | ORAL | 0 refills | Status: DC

## 2017-08-28 ENCOUNTER — Other Ambulatory Visit: Payer: Self-pay

## 2017-08-28 MED ORDER — FUROSEMIDE 40 MG PO TABS: 40.0000 mg | ORAL_TABLET | Freq: Every day | ORAL | 0 refills | Status: DC

## 2017-08-28 MED ORDER — HYDRALAZINE HCL 50 MG PO TABS: 50.0000 mg | ORAL_TABLET | Freq: Three times a day (TID) | ORAL | 0 refills | Status: DC

## 2017-08-28 MED ORDER — CLONIDINE 0.3 MG/24HR TD PTWK: 0.3000 mg | MEDICATED_PATCH | TRANSDERMAL | 0 refills | Status: DC

## 2017-08-28 NOTE — Telephone Encounter (Addendum)
Hypertension medication request: Hydralazine, Lasix and Clonidine patches   Last office visit pertaining to hypertension: 07/29/2017   BP Readings from Last 3 Encounters:  07/29/17 (!) 146/62  05/05/17 (!) 154/53  12/23/16 132/68    Lab Results  Component Value Date   CREATININE 1.61 (H) 07/29/2017   BUN 25 07/29/2017   NA 145 07/29/2017   K 4.8 07/29/2017   CL 106 07/29/2017   CO2 30 07/29/2017     Follow up on 09/28/2017

## 2017-08-29 ENCOUNTER — Other Ambulatory Visit: Payer: Self-pay | Admitting: Family Medicine

## 2017-08-29 DIAGNOSIS — E785 Hyperlipidemia, unspecified: Secondary | ICD-10-CM

## 2017-08-31 ENCOUNTER — Ambulatory Visit: Payer: Medicare Other | Admitting: Family Medicine

## 2017-09-07 ENCOUNTER — Other Ambulatory Visit: Payer: Self-pay | Admitting: Family Medicine

## 2017-09-07 DIAGNOSIS — I129 Hypertensive chronic kidney disease with stage 1 through stage 4 chronic kidney disease, or unspecified chronic kidney disease: Secondary | ICD-10-CM

## 2017-09-07 DIAGNOSIS — N183 Chronic kidney disease, stage 3 unspecified: Secondary | ICD-10-CM

## 2017-09-07 NOTE — Telephone Encounter (Signed)
Hypertension medication request: [facility]   Last office visit pertaining to hypertension: 07/29/2017   BP Readings from Last 3 Encounters:  07/29/17 (!) 146/62  05/05/17 (!) 154/53  12/23/16 132/68    Lab Results  Component Value Date   CREATININE 1.61 (H) 07/29/2017   BUN 25 07/29/2017   NA 145 07/29/2017   K 4.8 07/29/2017   CL 106 07/29/2017   CO2 30 07/29/2017     Follow up on 09/28/2017

## 2017-09-10 ENCOUNTER — Other Ambulatory Visit: Payer: Self-pay

## 2017-09-10 ENCOUNTER — Inpatient Hospital Stay
Admission: EM | Admit: 2017-09-10 | Discharge: 2017-09-15 | DRG: 690 | Disposition: A | Discharge status: Home Health | Payer: Medicare Other | Attending: Internal Medicine | Admitting: Internal Medicine

## 2017-09-10 ENCOUNTER — Emergency Department: Payer: Medicare Other

## 2017-09-10 DIAGNOSIS — N183 Chronic kidney disease, stage 3 (moderate): Secondary | ICD-10-CM | POA: Diagnosis present

## 2017-09-10 DIAGNOSIS — N390 Urinary tract infection, site not specified: Secondary | ICD-10-CM | POA: Diagnosis not present

## 2017-09-10 DIAGNOSIS — J449 Chronic obstructive pulmonary disease, unspecified: Secondary | ICD-10-CM | POA: Diagnosis present

## 2017-09-10 DIAGNOSIS — I2510 Atherosclerotic heart disease of native coronary artery without angina pectoris: Secondary | ICD-10-CM | POA: Diagnosis not present

## 2017-09-10 DIAGNOSIS — N939 Abnormal uterine and vaginal bleeding, unspecified: Secondary | ICD-10-CM | POA: Diagnosis present

## 2017-09-10 DIAGNOSIS — Z8673 Personal history of transient ischemic attack (TIA), and cerebral infarction without residual deficits: Secondary | ICD-10-CM

## 2017-09-10 DIAGNOSIS — B9620 Unspecified Escherichia coli [E. coli] as the cause of diseases classified elsewhere: Secondary | ICD-10-CM | POA: Diagnosis present

## 2017-09-10 DIAGNOSIS — R0600 Dyspnea, unspecified: Secondary | ICD-10-CM | POA: Diagnosis not present

## 2017-09-10 DIAGNOSIS — E785 Hyperlipidemia, unspecified: Secondary | ICD-10-CM | POA: Diagnosis present

## 2017-09-10 DIAGNOSIS — R001 Bradycardia, unspecified: Secondary | ICD-10-CM | POA: Diagnosis not present

## 2017-09-10 DIAGNOSIS — R1013 Epigastric pain: Secondary | ICD-10-CM | POA: Diagnosis not present

## 2017-09-10 DIAGNOSIS — E611 Iron deficiency: Secondary | ICD-10-CM | POA: Diagnosis not present

## 2017-09-10 DIAGNOSIS — R1030 Lower abdominal pain, unspecified: Secondary | ICD-10-CM | POA: Diagnosis not present

## 2017-09-10 DIAGNOSIS — L8990 Pressure ulcer of unspecified site, unspecified stage: Secondary | ICD-10-CM

## 2017-09-10 DIAGNOSIS — L89620 Pressure ulcer of left heel, unstageable: Secondary | ICD-10-CM | POA: Diagnosis not present

## 2017-09-10 DIAGNOSIS — E875 Hyperkalemia: Secondary | ICD-10-CM | POA: Diagnosis not present

## 2017-09-10 DIAGNOSIS — E1142 Type 2 diabetes mellitus with diabetic polyneuropathy: Secondary | ICD-10-CM | POA: Diagnosis present

## 2017-09-10 DIAGNOSIS — R112 Nausea with vomiting, unspecified: Secondary | ICD-10-CM

## 2017-09-10 DIAGNOSIS — N179 Acute kidney failure, unspecified: Secondary | ICD-10-CM | POA: Diagnosis not present

## 2017-09-10 DIAGNOSIS — E860 Dehydration: Secondary | ICD-10-CM | POA: Diagnosis not present

## 2017-09-10 DIAGNOSIS — I129 Hypertensive chronic kidney disease with stage 1 through stage 4 chronic kidney disease, or unspecified chronic kidney disease: Secondary | ICD-10-CM | POA: Diagnosis not present

## 2017-09-10 DIAGNOSIS — D631 Anemia in chronic kidney disease: Secondary | ICD-10-CM | POA: Diagnosis present

## 2017-09-10 DIAGNOSIS — R809 Proteinuria, unspecified: Secondary | ICD-10-CM | POA: Diagnosis present

## 2017-09-10 DIAGNOSIS — I509 Heart failure, unspecified: Secondary | ICD-10-CM | POA: Diagnosis not present

## 2017-09-10 DIAGNOSIS — D649 Anemia, unspecified: Secondary | ICD-10-CM | POA: Diagnosis not present

## 2017-09-10 DIAGNOSIS — Z87891 Personal history of nicotine dependence: Secondary | ICD-10-CM

## 2017-09-10 DIAGNOSIS — I5032 Chronic diastolic (congestive) heart failure: Secondary | ICD-10-CM | POA: Diagnosis not present

## 2017-09-10 DIAGNOSIS — E1122 Type 2 diabetes mellitus with diabetic chronic kidney disease: Secondary | ICD-10-CM | POA: Diagnosis present

## 2017-09-10 DIAGNOSIS — Z6841 Body Mass Index (BMI) 40.0 and over, adult: Secondary | ICD-10-CM | POA: Diagnosis not present

## 2017-09-10 DIAGNOSIS — Z9911 Dependence on respirator [ventilator] status: Secondary | ICD-10-CM | POA: Diagnosis not present

## 2017-09-10 DIAGNOSIS — I130 Hypertensive heart and chronic kidney disease with heart failure and stage 1 through stage 4 chronic kidney disease, or unspecified chronic kidney disease: Secondary | ICD-10-CM | POA: Diagnosis present

## 2017-09-10 DIAGNOSIS — G4733 Obstructive sleep apnea (adult) (pediatric): Secondary | ICD-10-CM | POA: Diagnosis not present

## 2017-09-10 DIAGNOSIS — K529 Noninfective gastroenteritis and colitis, unspecified: Secondary | ICD-10-CM | POA: Diagnosis not present

## 2017-09-10 DIAGNOSIS — K5730 Diverticulosis of large intestine without perforation or abscess without bleeding: Secondary | ICD-10-CM | POA: Diagnosis not present

## 2017-09-10 DIAGNOSIS — R197 Diarrhea, unspecified: Secondary | ICD-10-CM | POA: Diagnosis not present

## 2017-09-10 DIAGNOSIS — N3001 Acute cystitis with hematuria: Secondary | ICD-10-CM

## 2017-09-10 DIAGNOSIS — Z794 Long term (current) use of insulin: Secondary | ICD-10-CM

## 2017-09-10 DIAGNOSIS — Z9981 Dependence on supplemental oxygen: Secondary | ICD-10-CM | POA: Diagnosis not present

## 2017-09-10 DIAGNOSIS — Z7982 Long term (current) use of aspirin: Secondary | ICD-10-CM

## 2017-09-10 DIAGNOSIS — Z7401 Bed confinement status: Secondary | ICD-10-CM | POA: Diagnosis not present

## 2017-09-10 DIAGNOSIS — I10 Essential (primary) hypertension: Secondary | ICD-10-CM | POA: Diagnosis not present

## 2017-09-10 DIAGNOSIS — K219 Gastro-esophageal reflux disease without esophagitis: Secondary | ICD-10-CM | POA: Diagnosis present

## 2017-09-10 DIAGNOSIS — Z79899 Other long term (current) drug therapy: Secondary | ICD-10-CM

## 2017-09-10 DIAGNOSIS — Z888 Allergy status to other drugs, medicaments and biological substances status: Secondary | ICD-10-CM

## 2017-09-10 LAB — COMPREHENSIVE METABOLIC PANEL
ALBUMIN: 3.2 g/dL — AB (ref 3.5–5.0)
ALT: 9 U/L — ABNORMAL LOW (ref 14–54)
ANION GAP: 13 (ref 5–15)
AST: 23 U/L (ref 15–41)
Alkaline Phosphatase: 66 U/L (ref 38–126)
BILIRUBIN TOTAL: 0.6 mg/dL (ref 0.3–1.2)
BUN: 47 mg/dL — ABNORMAL HIGH (ref 6–20)
CALCIUM: 8.6 mg/dL — AB (ref 8.9–10.3)
CO2: 21 mmol/L — ABNORMAL LOW (ref 22–32)
Chloride: 108 mmol/L (ref 101–111)
Creatinine, Ser: 2.05 mg/dL — ABNORMAL HIGH (ref 0.44–1.00)
GFR calc non Af Amer: 23 mL/min — ABNORMAL LOW (ref >60)
GFR, EST AFRICAN AMERICAN: 27 mL/min — AB (ref >60)
GLUCOSE: 195 mg/dL — AB (ref 65–99)
Potassium: 4.4 mmol/L (ref 3.5–5.1)
SODIUM: 142 mmol/L (ref 135–145)
TOTAL PROTEIN: 7.7 g/dL (ref 6.5–8.1)

## 2017-09-10 LAB — CBC WITH DIFFERENTIAL/PLATELET
BASOS ABS: 0 10*3/uL (ref 0–0.1)
Basophils Relative: 0 %
EOS PCT: 0 %
Eosinophils Absolute: 0 10*3/uL (ref 0–0.7)
HEMATOCRIT: 28.8 % — AB (ref 35.0–47.0)
Hemoglobin: 8.9 g/dL — ABNORMAL LOW (ref 12.0–16.0)
LYMPHS PCT: 3 %
Lymphs Abs: 0.4 10*3/uL — ABNORMAL LOW (ref 1.0–3.6)
MCH: 23.8 pg — AB (ref 26.0–34.0)
MCHC: 31 g/dL — ABNORMAL LOW (ref 32.0–36.0)
MCV: 76.9 fL — AB (ref 80.0–100.0)
MONO ABS: 0.8 10*3/uL (ref 0.2–0.9)
Monocytes Relative: 6 %
NEUTROS ABS: 13.9 10*3/uL — AB (ref 1.4–6.5)
Neutrophils Relative %: 91 %
PLATELETS: 328 10*3/uL (ref 150–440)
RBC: 3.74 MIL/uL — ABNORMAL LOW (ref 3.80–5.20)
RDW: 16.4 % — AB (ref 11.5–14.5)
WBC: 15.2 10*3/uL — ABNORMAL HIGH (ref 3.6–11.0)

## 2017-09-10 LAB — URINALYSIS, COMPLETE (UACMP) WITH MICROSCOPIC
BILIRUBIN URINE: NEGATIVE
GLUCOSE, UA: NEGATIVE mg/dL
Ketones, ur: NEGATIVE mg/dL
NITRITE: NEGATIVE
Protein, ur: 100 mg/dL — AB
SPECIFIC GRAVITY, URINE: 1.016 (ref 1.005–1.030)
pH: 5 (ref 5.0–8.0)

## 2017-09-10 LAB — LIPASE, BLOOD: LIPASE: 25 U/L (ref 11–51)

## 2017-09-10 LAB — TYPE AND SCREEN
ABO/RH(D): B POS
ANTIBODY SCREEN: NEGATIVE

## 2017-09-10 MED ORDER — SODIUM CHLORIDE 0.9 % IV SOLN
1.0000 g | Freq: Once | INTRAVENOUS | Status: AC
  Administered 2017-09-11: 1 g via INTRAVENOUS
  Filled 2017-09-10: qty 10

## 2017-09-10 MED ORDER — FENTANYL CITRATE (PF) 100 MCG/2ML IJ SOLN
50.0000 ug | INTRAMUSCULAR | Status: DC | PRN
  Administered 2017-09-10 (×2): 50 ug via INTRAVENOUS
  Filled 2017-09-10: qty 2

## 2017-09-10 MED ORDER — PROMETHAZINE HCL 25 MG/ML IJ SOLN
12.5000 mg | Freq: Four times a day (QID) | INTRAMUSCULAR | Status: DC | PRN
  Administered 2017-09-10 (×2): 12.5 mg via INTRAVENOUS
  Filled 2017-09-10: qty 1

## 2017-09-10 NOTE — ED Triage Notes (Signed)
Pt arrived via EMS from home with c/o vaginal bleeding x1 month. EMS reports pt is waiting on exploratory surgery, but cardiologist has not signed off on procedure yet. Pr reports n/v and diarrhea with 1 episode of emesis today. Pt is A&O x4.

## 2017-09-10 NOTE — ED Provider Notes (Signed)
[facility] Emergency Department Provider Note    First MD Initiated Contact with Patient 09/10/17 1942     (approximate)  I have reviewed the triage vital signs and the nursing notes.   HISTORY  Chief Complaint Vaginal Bleeding and Abdominal Pain    HPI Veronica Anderson is a 72 y.o. female with extensive past medical history including CHF, CKD on chronic home O2 as well as chronic vaginal bleeding requiring multiple admissions for transfusions with concern for underlying malignancy presents with chief complaint of generalized epigastric pain associated with nonbloody nonbilious emesis and diarrhea.  No measured fevers.  Is having crampy mild to moderate epigastric pain that does not radiate through to her back.  States she is not having active vaginal bleeding.  No dysuria.  No blood in her stools.  Never had pain like this before.  Past Medical History:  Diagnosis Date  . Allergy   . Anemia   . Asthma   . Cataract    bilateral  . CHF (NYHA class II, ACC/AHA stage C) ([place])   . Chronic kidney disease    stage IV (severe), Dr. Aleene Davidson  . Constipation   . Diabetes mellitus without complication ([place])   . Edema    feet/ankles  . GERD (gastroesophageal reflux disease)   . Hyperlipidemia   . Hypertension   . Left ankle pain   . Lumbago   . Myocardial infarction ([place])   . Neuropathy   . Neuropathy   . OA (osteoarthritis)   . Obesity   . Obstructive sleep apnea syndrome    CPAP  . Paresthesia    Foot  . Protein calorie malnutrition ([place])   . Requires supplemental oxygen   . Stroke ([place])   . Vitamin D deficiency    Family History  Problem Relation Age of Onset  . Diabetes type II Unknown    Past Surgical History:  Procedure Laterality Date  . ANKLE SURGERY Left   . CATARACT EXTRACTION W/PHACO Right 12/20/2015   Procedure: CATARACT EXTRACTION PHACO AND INTRAOCULAR LENS PLACEMENT (IOC);  Surgeon: Eulogio Bear, MD;  Location: [facility] ORS;  Service:  Ophthalmology;  Laterality: Right;  [place] 2.50 AP% 21.8 CDE 37.04 Fluid pack lot # [id number]  . CATARACT EXTRACTION W/PHACO Left 02/14/2016   Procedure: CATARACT EXTRACTION PHACO AND INTRAOCULAR LENS PLACEMENT (IOC);  Surgeon: Eulogio Bear, MD;  Location: [facility] ORS;  Service: Ophthalmology;  Laterality: Left;  [place] 3.12 AP% 42.1 CDE 47.12 Fluid Pack lot # [id number]  . COLONOSCOPY    . FRACTURE SURGERY Left    ankle   Patient Active Problem List   Diagnosis Date Noted  . Chronic kidney disease (CKD), stage IV (severe) ([place]) 07/29/2017  . Mild protein-calorie malnutrition ([place]) 07/29/2017  . Anemia due to chronic kidney disease   . Prolonged Q-T interval on ECG 12/23/2016  . CHF (congestive heart failure), NYHA class I ([place]) 11/07/2016  . Nocturnal oxygen desaturation 06/26/2015  . Anemia in chronic illness 02/23/2015  . Asthma, mild intermittent 02/23/2015  . Benign essential HTN 02/23/2015  . Diastolic heart failure, NYHA class 3 ([place]) 02/23/2015  . Benign hypertension with chronic kidney disease, stage III ([place]) 02/23/2015  . CN (constipation) 02/23/2015  . Diabetes mellitus with neuropathy ([place]) 02/23/2015  . Dyslipidemia 02/23/2015  . Elevated ferritin 02/23/2015  . Gastro-esophageal reflux disease without esophagitis 02/23/2015  . LBP (low back pain) 02/23/2015  . Morbid obesity due to excess calories ([place]) 02/23/2015  . Obstructive apnea  02/23/2015  . Osteoarthritis 02/23/2015  . Neuropathy 02/23/2015  . Perennial allergic rhinitis with seasonal variation 02/23/2015  . History of pneumonia 02/23/2015  . Vitamin D deficiency 02/23/2015  . Cataract 04/16/2010  . Background diabetic retinopathy ([place]) 12/18/2009      Prior to Admission medications   Medication Sig Start Date End Date Taking? Authorizing Provider  ACCU-CHEK AVIVA PLUS test strip CHECK BLOOD SUGAR TWICE DAILY 08/06/17   Steele Sizer, MD  amLODipine (NORVASC) 10 MG tablet Take 1 tablet (10 mg total) by  mouth daily. 08/24/17   Steele Sizer, MD  aspirin EC 81 MG tablet Take 81 mg by mouth at bedtime.    [provider]  cloNIDine (CATAPRES - DOSED IN MG/24 HR) 0.3 mg/24hr patch Place 1 patch (0.3 mg total) onto the skin once a week. 08/28/17   Steele Sizer, MD  desonide (DESOWEN) 0.05 % ointment Apply 1 application topically 2 (two) times daily. 08/22/16   Steele Sizer, MD  furosemide (LASIX) 40 MG tablet Take 1 tablet (40 mg total) by mouth daily. 08/28/17   Steele Sizer, MD  gabapentin (NEURONTIN) 300 MG capsule TAKE 1 CAPSULE (300 MG TOTAL) BY MOUTH 3 (THREE) TIMES DAILY. 11/26/16   Steele Sizer, MD  glucose blood (ACCU-CHEK AVIVA PLUS) test strip 1 each by Other route 2 (two) times daily. Use as instructed 08/06/17   Steele Sizer, MD  hydrALAZINE (APRESOLINE) 50 MG tablet Take 1 tablet (50 mg total) by mouth 3 (three) times daily. 08/28/17   Steele Sizer, MD  Insulin Glargine (LANTUS SOLOSTAR) 100 UNIT/ML Solostar Pen INJECT 35 UNITS SUBCUTANEOUSLY DAILY 05/05/17   Gladstone Lighter, MD  insulin lispro (HUMALOG KWIKPEN) 100 UNIT/ML KiwkPen INJECT 5 TO 10 UNITS SUBCUTANEOUSLY BEFORE MEALS Patient taking differently: Inject 5 Units into the skin 3 (three) times daily.  08/21/16   Steele Sizer, MD  loratadine (CLARITIN) 10 MG tablet TAKE 1 TABLET BY MOUTH EVERY DAY FOR ALLERGIES Patient not taking: Reported on 07/29/2017 11/11/15   Steele Sizer, MD  metoprolol tartrate (LOPRESSOR) 100 MG tablet TAKE 1 TABLET BY MOUTH TWICE A DAY 09/07/17   Ancil Boozer, Drue Stager, MD  montelukast (SINGULAIR) 10 MG tablet Take 1 tablet (10 mg total) daily by mouth. 05/26/17   Sowles, Drue Stager, MD  NOVOFINE 32G X 6 MM MISC USE 4 TIMES A DAY AS DIRECTED 02/19/17   Steele Sizer, MD  nystatin (NYSTATIN) powder Apply 1 g topically 3 (three) times daily. 12/24/16   Hubbard Hartshorn, FNP  pravastatin (PRAVACHOL) 40 MG tablet TAKE 1 TABLET BY MOUTH EVERY DAY 08/29/17   Steele Sizer, MD  ranitidine (ZANTAC)  150 MG tablet Take 1 tablet (150 mg total) by mouth 2 (two) times daily. Trying to wean off Omeprazole 08/20/17   Steele Sizer, MD  Vitamin D, Ergocalciferol, (DRISDOL) 50000 units CAPS capsule TAKE ONE CAPSULE BY MOUTH ONCE A WEEK 08/02/17   Steele Sizer, MD    Allergies Ace inhibitors    Social History Social History   Tobacco Use  . Smoking status: Former Smoker    Types: Cigarettes    Last attempt to quit: 07/14/1994    Years since quitting: 23.1  . Smokeless tobacco: Never Used  Substance Use Topics  . Alcohol use: No    Alcohol/week: 0.0 oz  . Drug use: No    Review of Systems Patient denies headaches, rhinorrhea, blurry vision, numbness, shortness of breath, chest pain, edema, cough, abdominal pain, nausea, vomiting, diarrhea, dysuria, fevers, rashes or hallucinations unless otherwise  stated above in HPI. ____________________________________________   PHYSICAL EXAM:  VITAL SIGNS: Vitals:   09/10/17 2000 09/10/17 2215  BP: (!) 139/54 (!) 149/57  Pulse: 85 90  Resp: 19 18  Temp:    SpO2: 100% 100%    Constitutional: Alert and oriented. Chronically ill appearing in no acute distress. Eyes: Conjunctivae are normal.  Head: Atraumatic. Nose: No congestion/rhinnorhea. Mouth/Throat: Mucous membranes are moist.   Neck: No stridor. Painless ROM.  Cardiovascular: Normal rate, regular rhythm. Grossly normal heart sounds.  Good peripheral circulation. Respiratory: Normal respiratory effort.  No retractions. Lungs CTAB. Gastrointestinal: Soft with mild epigastric pain. No distention. No abdominal bruits. No CVA tenderness. Genitourinary:  Musculoskeletal: No lower extremity tenderness nor edema.  No joint effusions. Neurologic:  Normal speech and language. No gross focal neurologic deficits are appreciated. No facial droop Skin:  Skin is warm, dry and intact. No rash noted. Psychiatric: Mood and affect are normal. Speech and behavior are  normal.  ____________________________________________   LABS (all labs ordered are listed, but only abnormal results are displayed)  Results for orders placed or performed during the hospital encounter of 09/10/17 (from the past 24 hour(s))  Comprehensive metabolic panel     Status: Abnormal   Collection Time: 09/10/17  8:02 PM  Result Value Ref Range   Sodium 142 135 - 145 mmol/L   Potassium 4.4 3.5 - 5.1 mmol/L   Chloride 108 101 - 111 mmol/L   CO2 21 (L) 22 - 32 mmol/L   Glucose, Bld 195 (H) 65 - 99 mg/dL   BUN 47 (H) 6 - 20 mg/dL   Creatinine, Ser 2.05 (H) 0.44 - 1.00 mg/dL   Calcium 8.6 (L) 8.9 - 10.3 mg/dL   Total Protein 7.7 6.5 - 8.1 g/dL   Albumin 3.2 (L) 3.5 - 5.0 g/dL   AST 23 15 - 41 U/L   ALT 9 (L) 14 - 54 U/L   Alkaline Phosphatase 66 38 - 126 U/L   Total Bilirubin 0.6 0.3 - 1.2 mg/dL   GFR calc non Af Amer 23 (L) >60 mL/min   GFR calc Af Amer 27 (L) >60 mL/min   Anion gap 13 5 - 15  Lipase, blood     Status: None   Collection Time: 09/10/17  8:02 PM  Result Value Ref Range   Lipase 25 11 - 51 U/L  CBC with Differential/Platelet     Status: Abnormal   Collection Time: 09/10/17  9:32 PM  Result Value Ref Range   WBC 15.2 (H) 3.6 - 11.0 K/uL   RBC 3.74 (L) 3.80 - 5.20 MIL/uL   Hemoglobin 8.9 (L) 12.0 - 16.0 g/dL   HCT 28.8 (L) 35.0 - 47.0 %   MCV 76.9 (L) 80.0 - 100.0 fL   MCH 23.8 (L) 26.0 - 34.0 pg   MCHC 31.0 (L) 32.0 - 36.0 g/dL   RDW 16.4 (H) 11.5 - 14.5 %   Platelets 328 150 - 440 K/uL   Neutrophils Relative % 91 %   Neutro Abs 13.9 (H) 1.4 - 6.5 K/uL   Lymphocytes Relative 3 %   Lymphs Abs 0.4 (L) 1.0 - 3.6 K/uL   Monocytes Relative 6 %   Monocytes Absolute 0.8 0.2 - 0.9 K/uL   Eosinophils Relative 0 %   Eosinophils Absolute 0.0 0 - 0.7 K/uL   Basophils Relative 0 %   Basophils Absolute 0.0 0 - 0.1 K/uL  Type and screen [facility]     Status:  None   Collection Time: 09/10/17 10:14 PM  Result Value Ref Range   ABO/RH(D)  B POS    Antibody Screen NEG    Sample Expiration      09/13/2017 Performed at [facility], [address] [place], [place] [address]   Urinalysis, Complete w Microscopic     Status: Abnormal   Collection Time: 09/10/17 10:14 PM  Result Value Ref Range   Color, Urine YELLOW (A) YELLOW   APPearance TURBID (A) CLEAR   Specific Gravity, Urine 1.016 1.005 - 1.030   pH 5.0 5.0 - 8.0   Glucose, UA NEGATIVE NEGATIVE mg/dL   Hgb urine dipstick LARGE (A) NEGATIVE   Bilirubin Urine NEGATIVE NEGATIVE   Ketones, ur NEGATIVE NEGATIVE mg/dL   Protein, ur 100 (A) NEGATIVE mg/dL   Nitrite NEGATIVE NEGATIVE   Leukocytes, UA MODERATE (A) NEGATIVE   RBC / HPF TOO NUMEROUS TO COUNT 0 - 5 RBC/hpf   WBC, UA TOO NUMEROUS TO COUNT 0 - 5 WBC/hpf   Bacteria, UA MANY (A) NONE SEEN   Squamous Epithelial / LPF 0-5 (A) NONE SEEN   WBC Clumps PRESENT    Mucus PRESENT    ____________________________________________  EKG My review and personal interpretation at Time: 19:42   Indication: abd pain  Rate: 90  Rhythm: sinus Axis: left Other: delayed r wave progression, normal intervals, nonspecific st abn, ____________________________________________  RADIOLOGY  I personally reviewed all radiographic images ordered to evaluate for the above acute complaints and reviewed radiology reports and findings.  These findings were personally discussed with the patient.  Please see medical record for radiology report.  ____________________________________________   PROCEDURES  Procedure(s) performed:  Procedures    Critical Care performed: no ____________________________________________   INITIAL IMPRESSION / ASSESSMENT AND PLAN / ED COURSE  Pertinent labs & imaging results that were available during my care of the patient were reviewed by me and considered in my medical decision making (see chart for details).  DDX: sbo, enteritis, diverticulitis, malignancy, anemia, cholelithiasis,  cholecystitis  Veronica Anderson is a 72 y.o. who presents to the ED with symptoms as described above.  Given her chronic comorbidities blood work was ordered as well as CT imaging to further evaluate for any acute abnormality.  CT imaging showed what appears to be most consistent with enteritis.  No evidence of acute intra-abdominal process requiring surgical intervention.  Patient is feeling nauseated and having episodes of emesis.  No evidence of acute heart failure.  Patient is tachycardic but no evidence of acute blood loss anemia.  Urinalysis by catheterized specimen shows many bacteria and is consistent with acute UTI based on her comorbidities do believe the patient benefit from IV antibiotics admission the hospital for further symptomatic management.      ____________________________________________   FINAL CLINICAL IMPRESSION(S) / ED DIAGNOSES  Final diagnoses:  Nausea and vomiting, intractability of vomiting not specified, unspecified vomiting type  Epigastric pain  Acute cystitis with hematuria      NEW MEDICATIONS STARTED DURING THIS VISIT:  New Prescriptions   No medications on file     Note:  This document was prepared using Dragon voice recognition software and may include unintentional dictation errors.    Merlyn Lot, MD 09/11/17 0001

## 2017-09-11 ENCOUNTER — Inpatient Hospital Stay: Payer: Medicare Other

## 2017-09-11 ENCOUNTER — Other Ambulatory Visit: Payer: Self-pay

## 2017-09-11 DIAGNOSIS — G4733 Obstructive sleep apnea (adult) (pediatric): Secondary | ICD-10-CM | POA: Diagnosis present

## 2017-09-11 DIAGNOSIS — E611 Iron deficiency: Secondary | ICD-10-CM | POA: Diagnosis present

## 2017-09-11 DIAGNOSIS — E875 Hyperkalemia: Secondary | ICD-10-CM | POA: Diagnosis not present

## 2017-09-11 DIAGNOSIS — L89620 Pressure ulcer of left heel, unstageable: Secondary | ICD-10-CM | POA: Diagnosis present

## 2017-09-11 DIAGNOSIS — R1013 Epigastric pain: Secondary | ICD-10-CM | POA: Diagnosis present

## 2017-09-11 DIAGNOSIS — K529 Noninfective gastroenteritis and colitis, unspecified: Secondary | ICD-10-CM | POA: Diagnosis present

## 2017-09-11 DIAGNOSIS — I5032 Chronic diastolic (congestive) heart failure: Secondary | ICD-10-CM | POA: Diagnosis present

## 2017-09-11 DIAGNOSIS — R809 Proteinuria, unspecified: Secondary | ICD-10-CM | POA: Diagnosis present

## 2017-09-11 DIAGNOSIS — I2510 Atherosclerotic heart disease of native coronary artery without angina pectoris: Secondary | ICD-10-CM | POA: Diagnosis present

## 2017-09-11 DIAGNOSIS — N179 Acute kidney failure, unspecified: Secondary | ICD-10-CM | POA: Diagnosis present

## 2017-09-11 DIAGNOSIS — E1142 Type 2 diabetes mellitus with diabetic polyneuropathy: Secondary | ICD-10-CM | POA: Diagnosis present

## 2017-09-11 DIAGNOSIS — N390 Urinary tract infection, site not specified: Secondary | ICD-10-CM | POA: Diagnosis present

## 2017-09-11 DIAGNOSIS — N183 Chronic kidney disease, stage 3 (moderate): Secondary | ICD-10-CM | POA: Diagnosis present

## 2017-09-11 DIAGNOSIS — Z6841 Body Mass Index (BMI) 40.0 and over, adult: Secondary | ICD-10-CM | POA: Diagnosis not present

## 2017-09-11 DIAGNOSIS — Z9981 Dependence on supplemental oxygen: Secondary | ICD-10-CM | POA: Diagnosis not present

## 2017-09-11 DIAGNOSIS — E1122 Type 2 diabetes mellitus with diabetic chronic kidney disease: Secondary | ICD-10-CM | POA: Diagnosis present

## 2017-09-11 DIAGNOSIS — D631 Anemia in chronic kidney disease: Secondary | ICD-10-CM | POA: Diagnosis present

## 2017-09-11 DIAGNOSIS — D649 Anemia, unspecified: Secondary | ICD-10-CM | POA: Diagnosis present

## 2017-09-11 DIAGNOSIS — E860 Dehydration: Secondary | ICD-10-CM | POA: Diagnosis present

## 2017-09-11 DIAGNOSIS — Z794 Long term (current) use of insulin: Secondary | ICD-10-CM | POA: Diagnosis not present

## 2017-09-11 DIAGNOSIS — N939 Abnormal uterine and vaginal bleeding, unspecified: Secondary | ICD-10-CM | POA: Diagnosis present

## 2017-09-11 DIAGNOSIS — L8990 Pressure ulcer of unspecified site, unspecified stage: Secondary | ICD-10-CM

## 2017-09-11 DIAGNOSIS — R001 Bradycardia, unspecified: Secondary | ICD-10-CM | POA: Diagnosis not present

## 2017-09-11 DIAGNOSIS — B9620 Unspecified Escherichia coli [E. coli] as the cause of diseases classified elsewhere: Secondary | ICD-10-CM | POA: Diagnosis present

## 2017-09-11 DIAGNOSIS — I130 Hypertensive heart and chronic kidney disease with heart failure and stage 1 through stage 4 chronic kidney disease, or unspecified chronic kidney disease: Secondary | ICD-10-CM | POA: Diagnosis present

## 2017-09-11 DIAGNOSIS — J449 Chronic obstructive pulmonary disease, unspecified: Secondary | ICD-10-CM | POA: Diagnosis present

## 2017-09-11 DIAGNOSIS — N3001 Acute cystitis with hematuria: Secondary | ICD-10-CM | POA: Diagnosis present

## 2017-09-11 LAB — CBC
HEMATOCRIT: 26.7 % — AB (ref 35.0–47.0)
Hemoglobin: 8 g/dL — ABNORMAL LOW (ref 12.0–16.0)
MCH: 23.3 pg — ABNORMAL LOW (ref 26.0–34.0)
MCHC: 30.1 g/dL — ABNORMAL LOW (ref 32.0–36.0)
MCV: 77.3 fL — ABNORMAL LOW (ref 80.0–100.0)
PLATELETS: 336 10*3/uL (ref 150–440)
RBC: 3.46 MIL/uL — ABNORMAL LOW (ref 3.80–5.20)
RDW: 16.5 % — AB (ref 11.5–14.5)
WBC: 22.8 10*3/uL — AB (ref 3.6–11.0)

## 2017-09-11 LAB — BASIC METABOLIC PANEL
ANION GAP: 11 (ref 5–15)
BUN: 47 mg/dL — AB (ref 6–20)
CO2: 23 mmol/L (ref 22–32)
Calcium: 8.2 mg/dL — ABNORMAL LOW (ref 8.9–10.3)
Chloride: 106 mmol/L (ref 101–111)
Creatinine, Ser: 2.53 mg/dL — ABNORMAL HIGH (ref 0.44–1.00)
GFR calc Af Amer: 21 mL/min — ABNORMAL LOW (ref >60)
GFR, EST NON AFRICAN AMERICAN: 18 mL/min — AB (ref >60)
Glucose, Bld: 275 mg/dL — ABNORMAL HIGH (ref 65–99)
POTASSIUM: 4.9 mmol/L (ref 3.5–5.1)
Sodium: 140 mmol/L (ref 135–145)

## 2017-09-11 LAB — GLUCOSE, CAPILLARY
GLUCOSE-CAPILLARY: 275 mg/dL — AB (ref 65–99)
GLUCOSE-CAPILLARY: 165 mg/dL — AB (ref 65–99)
Glucose-Capillary: 279 mg/dL — ABNORMAL HIGH (ref 65–99)
Glucose-Capillary: 217 mg/dL — ABNORMAL HIGH (ref 65–99)

## 2017-09-11 LAB — MRSA PCR SCREENING: MRSA BY PCR: POSITIVE — AB

## 2017-09-11 MED ORDER — INSULIN ASPART 100 UNIT/ML ~~LOC~~ SOLN
0.0000 [IU] | Freq: Three times a day (TID) | SUBCUTANEOUS | Status: DC
  Administered 2017-09-11: 3 [IU] via SUBCUTANEOUS
  Administered 2017-09-11 (×2): 5 [IU] via SUBCUTANEOUS
  Administered 2017-09-12 (×2): 2 [IU] via SUBCUTANEOUS
  Administered 2017-09-12 – 2017-09-13 (×3): 1 [IU] via SUBCUTANEOUS
  Administered 2017-09-14 (×2): 2 [IU] via SUBCUTANEOUS
  Administered 2017-09-14: 1 [IU] via SUBCUTANEOUS
  Administered 2017-09-15 (×2): 2 [IU] via SUBCUTANEOUS
  Filled 2017-09-11 (×13): qty 1

## 2017-09-11 MED ORDER — INSULIN ASPART 100 UNIT/ML ~~LOC~~ SOLN
3.0000 [IU] | Freq: Three times a day (TID) | SUBCUTANEOUS | Status: DC
  Administered 2017-09-12 – 2017-09-15 (×10): 3 [IU] via SUBCUTANEOUS
  Filled 2017-09-11 (×10): qty 1

## 2017-09-11 MED ORDER — TRAZODONE HCL 50 MG PO TABS: 25.0000 mg | ORAL_TABLET | Freq: Every evening | ORAL | Status: DC | PRN

## 2017-09-11 MED ORDER — INSULIN GLARGINE 100 UNIT/ML ~~LOC~~ SOLN
25.0000 [IU] | Freq: Every day | SUBCUTANEOUS | Status: DC
  Administered 2017-09-11 – 2017-09-14 (×3): 25 [IU] via SUBCUTANEOUS
  Filled 2017-09-11 (×5): qty 0.25

## 2017-09-11 MED ORDER — ACETAMINOPHEN 650 MG RE SUPP: 650.0000 mg | Freq: Four times a day (QID) | RECTAL | Status: DC | PRN

## 2017-09-11 MED ORDER — MORPHINE SULFATE (PF) 2 MG/ML IV SOLN: 2.0000 mg | INTRAVENOUS | Status: DC | PRN

## 2017-09-11 MED ORDER — MONTELUKAST SODIUM 10 MG PO TABS
10.0000 mg | ORAL_TABLET | Freq: Every day | ORAL | Status: DC
  Administered 2017-09-11 – 2017-09-15 (×5): 10 mg via ORAL
  Filled 2017-09-11 (×5): qty 1

## 2017-09-11 MED ORDER — MEDROXYPROGESTERONE ACETATE 10 MG PO TABS
20.0000 mg | ORAL_TABLET | Freq: Every day | ORAL | Status: DC
  Administered 2017-09-11 – 2017-09-15 (×5): 20 mg via ORAL
  Filled 2017-09-11 (×5): qty 2

## 2017-09-11 MED ORDER — ONDANSETRON HCL 4 MG/2ML IJ SOLN: 4.0000 mg | Freq: Four times a day (QID) | INTRAMUSCULAR | Status: DC | PRN

## 2017-09-11 MED ORDER — ORAL CARE MOUTH RINSE
15.0000 mL | Freq: Two times a day (BID) | OROMUCOSAL | Status: DC
  Administered 2017-09-11 – 2017-09-14 (×7): 15 mL via OROMUCOSAL

## 2017-09-11 MED ORDER — ACETAMINOPHEN 325 MG PO TABS
650.0000 mg | ORAL_TABLET | Freq: Four times a day (QID) | ORAL | Status: DC | PRN
  Administered 2017-09-14: 650 mg via ORAL
  Filled 2017-09-11: qty 2

## 2017-09-11 MED ORDER — PRAVASTATIN SODIUM 20 MG PO TABS
40.0000 mg | ORAL_TABLET | Freq: Every day | ORAL | Status: DC
  Administered 2017-09-11 – 2017-09-14 (×4): 40 mg via ORAL
  Filled 2017-09-11 (×4): qty 2

## 2017-09-11 MED ORDER — GABAPENTIN 300 MG PO CAPS
300.0000 mg | ORAL_CAPSULE | Freq: Three times a day (TID) | ORAL | Status: DC
  Administered 2017-09-11 – 2017-09-15 (×13): 300 mg via ORAL
  Filled 2017-09-11 (×13): qty 1

## 2017-09-11 MED ORDER — SODIUM CHLORIDE 0.9 % IV SOLN
2.0000 g | INTRAVENOUS | Status: DC
  Administered 2017-09-11 – 2017-09-12 (×2): 2 g via INTRAVENOUS
  Filled 2017-09-11 (×2): qty 20

## 2017-09-11 MED ORDER — CLONIDINE HCL 0.3 MG/24HR TD PTWK: 0.3000 mg | MEDICATED_PATCH | TRANSDERMAL | Status: DC

## 2017-09-11 MED ORDER — FUROSEMIDE 40 MG PO TABS: 40.0000 mg | ORAL_TABLET | Freq: Every day | ORAL | Status: DC

## 2017-09-11 MED ORDER — INSULIN ASPART 100 UNIT/ML ~~LOC~~ SOLN: 0.0000 [IU] | Freq: Every day | SUBCUTANEOUS | Status: DC

## 2017-09-11 MED ORDER — ONDANSETRON HCL 4 MG PO TABS: 4.0000 mg | ORAL_TABLET | Freq: Four times a day (QID) | ORAL | Status: DC | PRN

## 2017-09-11 MED ORDER — DOCUSATE SODIUM 100 MG PO CAPS
100.0000 mg | ORAL_CAPSULE | Freq: Two times a day (BID) | ORAL | Status: DC
  Administered 2017-09-11 – 2017-09-15 (×7): 100 mg via ORAL
  Filled 2017-09-11 (×8): qty 1

## 2017-09-11 MED ORDER — SODIUM CHLORIDE 0.9 % IV BOLUS (SEPSIS)
250.0000 mL | INTRAVENOUS | Status: AC
  Administered 2017-09-11: 250 mL via INTRAVENOUS

## 2017-09-11 MED ORDER — BISACODYL 5 MG PO TBEC: 5.0000 mg | DELAYED_RELEASE_TABLET | Freq: Every day | ORAL | Status: DC | PRN

## 2017-09-11 MED ORDER — METOPROLOL TARTRATE 50 MG PO TABS
100.0000 mg | ORAL_TABLET | Freq: Two times a day (BID) | ORAL | Status: DC
  Administered 2017-09-11 – 2017-09-12 (×4): 100 mg via ORAL
  Filled 2017-09-11 (×4): qty 2

## 2017-09-11 MED ORDER — AMLODIPINE BESYLATE 10 MG PO TABS
10.0000 mg | ORAL_TABLET | Freq: Every day | ORAL | Status: DC
  Administered 2017-09-11 – 2017-09-15 (×4): 10 mg via ORAL
  Filled 2017-09-11 (×4): qty 1

## 2017-09-11 MED ORDER — FAMOTIDINE 20 MG PO TABS
20.0000 mg | ORAL_TABLET | Freq: Every day | ORAL | Status: DC
  Administered 2017-09-11 – 2017-09-15 (×5): 20 mg via ORAL
  Filled 2017-09-11 (×5): qty 1

## 2017-09-11 MED ORDER — HYDROCODONE-ACETAMINOPHEN 5-325 MG PO TABS
1.0000 | ORAL_TABLET | ORAL | Status: DC | PRN
  Administered 2017-09-11 (×3): 1 via ORAL
  Administered 2017-09-12 (×2): 2 via ORAL
  Administered 2017-09-14: 1 via ORAL
  Filled 2017-09-11 (×2): qty 2
  Filled 2017-09-11 (×4): qty 1

## 2017-09-11 MED ORDER — CLONIDINE HCL 0.3 MG/24HR TD PTWK
0.3000 mg | MEDICATED_PATCH | TRANSDERMAL | Status: DC
  Filled 2017-09-11: qty 1

## 2017-09-11 MED ORDER — HEPARIN SODIUM (PORCINE) 5000 UNIT/ML IJ SOLN
5000.0000 [IU] | Freq: Three times a day (TID) | INTRAMUSCULAR | Status: DC
  Administered 2017-09-11 – 2017-09-15 (×13): 5000 [IU] via SUBCUTANEOUS
  Filled 2017-09-11 (×13): qty 1

## 2017-09-11 MED ORDER — SODIUM CHLORIDE 0.9 % IV SOLN
INTRAVENOUS | Status: AC
  Administered 2017-09-11 – 2017-09-13 (×6): via INTRAVENOUS

## 2017-09-11 NOTE — Progress Notes (Signed)
Inpatient Diabetes Program Recommendations  AACE/ADA: New Consensus Statement on Inpatient Glycemic Control (2015)  Target Ranges:  Prepandial:   less than 140 mg/dL      Peak postprandial:   less than 180 mg/dL (1-2 hours)      Critically ill patients:  140 - 180 mg/dL   Lab Results  Component Value Date   GLUCAP 275 (H) 09/11/2017   HGBA1C 6.1 07/29/2017    Review of Glycemic Control  Results for Veronica Anderson, Veronica Anderson (MRN [id number]) as of 09/11/2017 11:00  Ref. Range 09/11/2017 07:28  Glucose-Capillary Latest Ref Range: 65 - 99 mg/dL 275 (H)    Diabetes history: Type 2 Outpatient Diabetes medications: Lantus 35 units qday, Humalog 5 units tid Current orders for Inpatient glycemic control: Novolog 0-9 units tid, Novolog 0-5 units qhs, Lantus 25 units tid  Inpatient Diabetes Program Recommendations:  Consider adding Novolog 2 units tid (hold if patient eats less than 50%)- continue Novolog correction and Lantus as ordered.   Last A1C likely not accurate since Hemoglobin at that time (07/29/17) was 5.5g/dL  Gentry Fitz, RN, BA, [place], CDE Diabetes Coordinator Inpatient Diabetes Program  [phone] (Team Pager) [phone] ([place]) 09/11/2017 11:03 AM

## 2017-09-11 NOTE — Progress Notes (Signed)
[place] [facility] [place] NAME: Veronica Anderson    MR#:  [id number]  DATE OF BIRTH:  1945-08-05  SUBJECTIVE:  CHIEF COMPLAINT:   Chief Complaint  Patient presents with  . Vaginal Bleeding  . Abdominal Pain   - Admitted with abdominal pain, nausea and vomiting. No further diarrhea. Worsening renal failure. Also has UTI. - wbc worsening as well  REVIEW OF SYSTEMS:  Review of Systems  Constitutional: Negative for chills and fever.  HENT: Positive for hearing loss. Negative for congestion, ear discharge and nosebleeds.   Eyes: Negative for blurred vision and double vision.  Respiratory: Negative for cough, shortness of breath and wheezing.   Cardiovascular: Negative for chest pain and palpitations.  Gastrointestinal: Positive for abdominal pain, nausea and vomiting. Negative for constipation and diarrhea.  Genitourinary: Negative for dysuria and urgency.  Musculoskeletal: Positive for back pain and myalgias.  Neurological: Negative for dizziness, speech change, focal weakness, seizures and headaches.  Psychiatric/Behavioral: Negative for depression.    DRUG ALLERGIES:   Allergies  Allergen Reactions  . Ace Inhibitors Cough    VITALS:  Blood pressure (!) 156/58, pulse 81, temperature 98.2 F (36.8 C), temperature source Oral, resp. rate (!) 21, height 5\' 4"  (1.626 m), weight 132.1 kg (291 lb 3.6 oz), SpO2 100 %.  PHYSICAL EXAMINATION:  Physical Exam  GENERAL:  72 y.o.-year-old obese patient lying in the bed with no acute distress.  EYES: Pupils equal, round, reactive to light and accommodation. No scleral icterus. Extraocular muscles intact.  HEENT: Head atraumatic, normocephalic. Oropharynx and nasopharynx clear.  NECK:  Supple, no jugular venous distention. No thyroid enlargement, no tenderness.  LUNGS: Normal breath sounds bilaterally, no wheezing, rales,rhonchi or crepitation. No use of accessory muscles of respiration. Decreased  bibasilar breath sounds CARDIOVASCULAR: S1, S2 normal. No   rubs, or gallops. 2/6 systolic murmur present ABDOMEN: Soft, tender in the lower quadrant, nondistended. Bowel sounds present. No organomegaly or mass.  EXTREMITIES: No pedal edema, cyanosis, or clubbing.  NEUROLOGIC: Cranial nerves II through XII are intact. Muscle strength 5/5 in all extremities. Sensation intact. Gait not checked.  PSYCHIATRIC: The patient is alert and oriented x 3.  SKIN: No obvious rash, lesion, or ulcer.    LABORATORY PANEL:   CBC Recent Labs  Lab 09/11/17 0439  WBC 22.8*  HGB 8.0*  HCT 26.7*  PLT 336   ------------------------------------------------------------------------------------------------------------------  Chemistries  Recent Labs  Lab 09/10/17 2002 09/11/17 0439  NA 142 140  K 4.4 4.9  CL 108 106  CO2 21* 23  GLUCOSE 195* 275*  BUN 47* 47*  CREATININE 2.05* 2.53*  CALCIUM 8.6* 8.2*  AST 23  --   ALT 9*  --   ALKPHOS 66  --   BILITOT 0.6  --    ------------------------------------------------------------------------------------------------------------------  Cardiac Enzymes No results for input(s): TROPONINI in the last 168 hours. ------------------------------------------------------------------------------------------------------------------  RADIOLOGY:  Ct Abdomen Pelvis Wo Contrast  Result Date: 09/10/2017 CLINICAL DATA:  72 year old female with abdominal pain. EXAM: CT ABDOMEN AND PELVIS WITHOUT CONTRAST TECHNIQUE: Multidetector CT imaging of the abdomen and pelvis was performed following the standard protocol without IV contrast. COMPARISON:  Renal ultrasound dated 04/25/2017 and abdominal radiograph dated 04/26/2017 FINDINGS: Evaluation of this exam is limited in the absence of intravenous contrast. Lower chest: The visualized lung bases are clear. Mild cardiomegaly. No intra-abdominal free air.  Small ascites Hepatobiliary: Mild irregularity of the liver contour may  represent early changes of cirrhosis. Clinical  correlation is recommended. No intrahepatic biliary ductal dilatation. There is layering stone within the gallbladder. No pericholecystic fluid. Pancreas: Unremarkable. No pancreatic ductal dilatation or surrounding inflammatory changes. Spleen: Normal in size without focal abnormality. Adrenals/Urinary Tract: The adrenal glands, kidneys, visualized ureters, and urinary bladder appear unremarkable. Stomach/Bowel: There is a small hiatal hernia with gastroesophageal reflux. Liquid content noted within the stomach. There is inflammatory changes and thickening of multiple loops of small bowel throughout the abdomen most consistent with enteritis. Evaluation of the bowel is however limited in the absence of oral contrast. No bowel dilatation or evidence of obstruction. There is colonic diverticulosis without active inflammatory changes. Normal appendix. Vascular/Lymphatic: There is moderate aortoiliac atherosclerotic disease. No portal venous gas. There is no adenopathy. Reproductive: The uterus is enlarged and myomatous. Other: None Musculoskeletal: Mild degenerative changes of the lower lumbar spine. No acute osseous pathology. IMPRESSION: 1. Findings most consistent with enteritis or gastroenteritis. Clinical correlation is recommended. No evidence of bowel obstruction. Normal appendix. 2. Colonic diverticulosis without active inflammatory changes. 3. Small ascites which may be related to enteritis or underlying liver disease. 4. Cholelithiasis. 5. Enlarged myomatous uterus. 6.  Aortic Atherosclerosis (ICD10-I70.0). Electronically Signed   By: Anner Crete M.D.   On: 09/10/2017 22:10    EKG:   Orders placed or performed during the hospital encounter of 09/10/17  . EKG 12-Lead  . EKG 12-Lead    ASSESSMENT AND PLAN:   72 year old female with past medical history significant for CK D stage III, type 2 diabetes mellitus, CAD, COPD, congestive heart failure,  recent vaginal bleeding following up at [facility] presents to hospital secondary to abdominal pain, nausea and vomiting.  1. Abdominal pain with nausea and vomiting-CT of the abdomen showing possible infiltrate changes and small bowel likely consistent with enteritis/gastroenteritis. -Much improved symptoms. Advance diet. . Hold off on antibiotics for enteritis at this time. -If further loose stools present, send for stool studies.  2. Acute cystitis-urine cultures are pending -Continue Rocephin.  3. Acute renal failure on CKD- Baseline creatinine seems to be around 1.4-1.5. -Admission creatinine is at 2 and currently at 2.5 -Hold nephrotoxins. Patient was on Lasix at home -Gentle hydration as BUN and creatinine have worsened. -Renal ultrasound to rule out any obstruction. Check labs again in a.m.  4. COPD and obstructive sleep apnea-continue CPAP at bedtime. -Chronically on 2 L oxygen  5. Hypertension-continue Norvasc and clonidine at this time. Hold off on hydralazine until blood pressure  improves  6. Vaginal bleeding-  started on Provera by [facility]YN. Has a follow-up appointment for possible D&C and biopsy  7. Diabetes mellitus-continue Lantus and sliding scale insulin.  8. DVT prophylaxis heparin subcutaneous heparin  Patient lives at home with family. She takes very minimal steps with a walker. Her Sons help her at home    All the records are reviewed and case discussed with Care Management/Social Workerr. Management plans discussed with the patient, family and they are in agreement.  CODE STATUS: Full Code  TOTAL TIME TAKING CARE OF THIS PATIENT: 39 minutes.   POSSIBLE D/C IN 2-3 DAYS, DEPENDING ON CLINICAL CONDITION.   Gladstone Lighter M.D on 09/11/2017 at 2:21 PM  Between 7am to 6pm - Pager - [phone]  After 6pm go to [email or web address] - password [email or web address] [place] [facility]  Office  [phone]  CC: Primary care physician; Steele Sizer, MD

## 2017-09-11 NOTE — H&P (Addendum)
[facility] NAME: Veronica Anderson    MR#:  [id number]  DATE OF BIRTH:  [birth year]  DATE OF ADMISSION:  09/10/2017  PRIMARY CARE PHYSICIAN: Steele Sizer, MD   REQUESTING/REFERRING PHYSICIAN:   CHIEF COMPLAINT:   Chief Complaint  Patient presents with  . Vaginal Bleeding  . Abdominal Pain    HISTORY OF PRESENT ILLNESS: Veronica Anderson  is a 72 y.o. female with many medical problems, including CHF, CKD 3 , diabetes type 2, coronary artery disease, and more recently, recurrent vaginal bleeding episodes, requiring transfusions.  Patient was brought to emergency room for acute onset of severe epigastric pain, nausea and vomiting, going on for the past 24 hours gradually getting worse.  Patient denies having fever or chills.  No diarrhea or bleeding.  She does not recall any sick contacts.  She cannot identify any factors that would make her symptoms better or worse and denies having similar episodes in the past. Blood test done in the emergency room showed elevated WBC at 15.2 creatinine level is 2.05.  Hemoglobin level is 8.9.  UA is positive for UTI. CT of the abdomen, reviewed by myself, shows gastroenteritis changes. Patient is admitted for further evaluation and treatment.  PAST MEDICAL HISTORY:   Past Medical History:  Diagnosis Date  . Allergy   . Anemia   . Asthma   . Cataract    bilateral  . CHF (NYHA class II, ACC/AHA stage C) ([place])   . Chronic kidney disease    stage IV (severe), Dr. Aleene Davidson  . Constipation   . Diabetes mellitus without complication ([place])   . Edema    feet/ankles  . GERD (gastroesophageal reflux disease)   . Hyperlipidemia   . Hypertension   . Left ankle pain   . Lumbago   . Myocardial infarction ([place])   . Neuropathy   . Neuropathy   . OA (osteoarthritis)   . Obesity   . Obstructive sleep apnea syndrome    CPAP  . Paresthesia    Foot  . Protein calorie malnutrition ([place])   . Requires  supplemental oxygen   . Stroke ([place])   . Vitamin D deficiency     PAST SURGICAL HISTORY:  Past Surgical History:  Procedure Laterality Date  . ANKLE SURGERY Left   . CATARACT EXTRACTION W/PHACO Right 12/20/2015   Procedure: CATARACT EXTRACTION PHACO AND INTRAOCULAR LENS PLACEMENT (IOC);  Surgeon: Eulogio Bear, MD;  Location: [facility] ORS;  Service: Ophthalmology;  Laterality: Right;  [place] 2.50 AP% 21.8 CDE 37.04 Fluid pack lot # [id number]  . CATARACT EXTRACTION W/PHACO Left 02/14/2016   Procedure: CATARACT EXTRACTION PHACO AND INTRAOCULAR LENS PLACEMENT (IOC);  Surgeon: Eulogio Bear, MD;  Location: [facility] ORS;  Service: Ophthalmology;  Laterality: Left;  [place] 3.12 AP% 42.1 CDE 47.12 Fluid Pack lot # [id number]  . COLONOSCOPY    . FRACTURE SURGERY Left    ankle    SOCIAL HISTORY:  Social History   Tobacco Use  . Smoking status: Former Smoker    Types: Cigarettes    Last attempt to quit: 07/14/1994    Years since quitting: 23.1  . Smokeless tobacco: Never Used  Substance Use Topics  . Alcohol use: No    Alcohol/week: 0.0 oz    FAMILY HISTORY:  Family History  Problem Relation Age of Onset  . Diabetes type II Unknown     DRUG ALLERGIES:  Allergies  Allergen Reactions  . Ace  Inhibitors Cough    REVIEW OF SYSTEMS:   CONSTITUTIONAL: No fever, but complains of fatigue and generalized weakness.  EYES: No vision changes.  EARS, NOSE, AND THROAT: No tinnitus or ear pain.  RESPIRATORY: No cough, shortness of breath, wheezing or hemoptysis.  CARDIOVASCULAR: No chest pain, orthopnea, edema.  GASTROINTESTINAL: Positive for epigastric pain, nausea and vomiting; no diarrhea. GENITOURINARY: No dysuria, hematuria.  ENDOCRINE: No polyuria, nocturia,  HEMATOLOGY: Positive history of recurrent vaginal bleeding but currently no bleeding. SKIN: No rash or lesion. MUSCULOSKELETAL: Positive history of osteoarthritis.   NEUROLOGIC: No focal weakness.  PSYCHIATRY: No anxiety or  depression.   MEDICATIONS AT HOME:  Prior to Admission medications   Medication Sig Start Date End Date Taking? Authorizing Provider  amLODipine (NORVASC) 10 MG tablet Take 1 tablet (10 mg total) by mouth daily. 08/24/17  Yes Steele Sizer, MD  aspirin EC 81 MG tablet Take 81 mg by mouth at bedtime.   Yes [provider]  cloNIDine (CATAPRES - DOSED IN MG/24 HR) 0.3 mg/24hr patch Place 1 patch (0.3 mg total) onto the skin once a week. 08/28/17  Yes Sowles, Drue Stager, MD  desonide (DESOWEN) 0.05 % ointment Apply 1 application topically 2 (two) times daily. 08/22/16  Yes Sowles, Drue Stager, MD  furosemide (LASIX) 40 MG tablet Take 1 tablet (40 mg total) by mouth daily. 08/28/17  Yes Sowles, Drue Stager, MD  gabapentin (NEURONTIN) 300 MG capsule TAKE 1 CAPSULE (300 MG TOTAL) BY MOUTH 3 (THREE) TIMES DAILY. 11/26/16  Yes Sowles, Drue Stager, MD  hydrALAZINE (APRESOLINE) 50 MG tablet Take 1 tablet (50 mg total) by mouth 3 (three) times daily. 08/28/17  Yes Sowles, Drue Stager, MD  Insulin Glargine (LANTUS SOLOSTAR) 100 UNIT/ML Solostar Pen INJECT 35 UNITS SUBCUTANEOUSLY DAILY 05/05/17  Yes Gladstone Lighter, MD  insulin lispro (HUMALOG KWIKPEN) 100 UNIT/ML KiwkPen INJECT 5 TO 10 UNITS SUBCUTANEOUSLY BEFORE MEALS Patient taking differently: Inject 5 Units into the skin 3 (three) times daily.  08/21/16  Yes Sowles, Drue Stager, MD  medroxyPROGESTERone (PROVERA) 10 MG tablet Take 20 mg by mouth daily. 09/03/17  Yes [provider]  metoprolol tartrate (LOPRESSOR) 100 MG tablet TAKE 1 TABLET BY MOUTH TWICE A DAY 09/07/17  Yes Sowles, Drue Stager, MD  montelukast (SINGULAIR) 10 MG tablet Take 1 tablet (10 mg total) daily by mouth. 05/26/17  Yes Sowles, Drue Stager, MD  nystatin (NYSTATIN) powder Apply 1 g topically 3 (three) times daily. 12/24/16  Yes Hubbard Hartshorn, FNP  pravastatin (PRAVACHOL) 40 MG tablet TAKE 1 TABLET BY MOUTH EVERY DAY 08/29/17  Yes Sowles, Drue Stager, MD  ranitidine (ZANTAC) 150 MG tablet Take 1 tablet (150  mg total) by mouth 2 (two) times daily. Trying to wean off Omeprazole 08/20/17  Yes Sowles, Drue Stager, MD  Vitamin D, Ergocalciferol, (DRISDOL) 50000 units CAPS capsule TAKE ONE CAPSULE BY MOUTH ONCE A WEEK Patient taking differently: TAKE ONE CAPSULE BY MOUTH ONCE A WEEK On Sunday 08/02/17  Yes Sowles, Drue Stager, MD  ACCU-CHEK AVIVA PLUS test strip [place] 08/06/17   Ancil Boozer, Drue Stager, MD  glucose blood (ACCU-CHEK AVIVA PLUS) test strip 1 each by Other route 2 (two) times daily. Use as instructed 08/06/17   Steele Sizer, MD  loratadine (CLARITIN) 10 MG tablet TAKE 1 TABLET BY MOUTH EVERY DAY FOR ALLERGIES Patient not taking: Reported on 07/29/2017 11/11/15   Steele Sizer, MD  NOVOFINE 32G X 6 MM MISC USE 4 TIMES A DAY AS DIRECTED 02/19/17   Steele Sizer, MD  PHYSICAL EXAMINATION:   VITAL SIGNS: Blood pressure (!) 142/54, pulse 89, temperature 98.7 F (37.1 C), temperature source Oral, resp. rate 20, height 5\' 4"  (1.626 m), weight 132.1 kg (291 lb 3.6 oz), SpO2 100 %.  GENERAL:  72 y.o.-year-old patient lying in the bed, in moderate distress, secondary to epigastric pain.  EYES: Pupils equal, round, reactive to light and accommodation. No scleral icterus. Extraocular muscles intact.  HEENT: Head atraumatic, normocephalic. Oropharynx and nasopharynx clear.  Dry mucous membranes NECK:  Supple, no jugular venous distention. No thyroid enlargement, no tenderness.  LUNGS: Normal breath sounds bilaterally, no wheezing, rales,rhonchi or crepitation. No use of accessory muscles of respiration.  CARDIOVASCULAR: S1, S2 normal. No S3/S4.  ABDOMEN: Soft, nondistended.  There is tenderness with deep palpation in epigastric area, no guarding or rebound.  Bowel sounds present. No organomegaly or mass.  EXTREMITIES: No pedal edema, cyanosis, or clubbing.  NEUROLOGIC: No focal weakness.  Gait not checked, due to generalized weakness.  PSYCHIATRIC: The patient is alert and oriented x 3.   SKIN: No obvious rash.  The skin is warm and dry.  LABORATORY PANEL:   CBC Recent Labs  Lab 09/10/17 2132  WBC 15.2*  HGB 8.9*  HCT 28.8*  PLT 328  MCV 76.9*  MCH 23.8*  MCHC 31.0*  RDW 16.4*  LYMPHSABS 0.4*  MONOABS 0.8  EOSABS 0.0  BASOSABS 0.0   ------------------------------------------------------------------------------------------------------------------  Chemistries  Recent Labs  Lab 09/10/17 2002  NA 142  K 4.4  CL 108  CO2 21*  GLUCOSE 195*  BUN 47*  CREATININE 2.05*  CALCIUM 8.6*  AST 23  ALT 9*  ALKPHOS 66  BILITOT 0.6   ------------------------------------------------------------------------------------------------------------------ estimated creatinine clearance is 34.1 mL/min (A) (by C-G formula based on SCr of 2.05 mg/dL (H)). ------------------------------------------------------------------------------------------------------------------ No results for input(s): TSH, T4TOTAL, T3FREE, THYROIDAB in the last 72 hours.  Invalid input(s): FREET3   Coagulation profile No results for input(s): INR, PROTIME in the last 168 hours. ------------------------------------------------------------------------------------------------------------------- No results for input(s): DDIMER in the last 72 hours. -------------------------------------------------------------------------------------------------------------------  Cardiac Enzymes No results for input(s): CKMB, TROPONINI, MYOGLOBIN in the last 168 hours.  Invalid input(s): CK ------------------------------------------------------------------------------------------------------------------ Invalid input(s): POCBNP  ---------------------------------------------------------------------------------------------------------------  Urinalysis    Component Value Date/Time   COLORURINE YELLOW (A) 09/10/2017 2214   APPEARANCEUR TURBID (A) 09/10/2017 2214   LABSPEC 1.016 09/10/2017 2214   PHURINE 5.0  09/10/2017 2214   GLUCOSEU NEGATIVE 09/10/2017 2214   HGBUR LARGE (A) 09/10/2017 2214   BILIRUBINUR NEGATIVE 09/10/2017 2214   KETONESUR NEGATIVE 09/10/2017 2214   PROTEINUR 100 (A) 09/10/2017 2214   NITRITE NEGATIVE 09/10/2017 2214   LEUKOCYTESUR MODERATE (A) 09/10/2017 2214     RADIOLOGY: Ct Abdomen Pelvis Wo Contrast  Result Date: 09/10/2017 CLINICAL DATA:  72 year old female with abdominal pain. EXAM: CT ABDOMEN AND PELVIS WITHOUT CONTRAST TECHNIQUE: Multidetector CT imaging of the abdomen and pelvis was performed following the standard protocol without IV contrast. COMPARISON:  Renal ultrasound dated 04/25/2017 and abdominal radiograph dated 04/26/2017 FINDINGS: Evaluation of this exam is limited in the absence of intravenous contrast. Lower chest: The visualized lung bases are clear. Mild cardiomegaly. No intra-abdominal free air.  Small ascites Hepatobiliary: Mild irregularity of the liver contour may represent early changes of cirrhosis. Clinical correlation is recommended. No intrahepatic biliary ductal dilatation. There is layering stone within the gallbladder. No pericholecystic fluid. Pancreas: Unremarkable. No pancreatic ductal dilatation or surrounding inflammatory changes. Spleen: Normal in size without focal abnormality. Adrenals/Urinary Tract: The adrenal glands, kidneys,  visualized ureters, and urinary bladder appear unremarkable. Stomach/Bowel: There is a small hiatal hernia with gastroesophageal reflux. Liquid content noted within the stomach. There is inflammatory changes and thickening of multiple loops of small bowel throughout the abdomen most consistent with enteritis. Evaluation of the bowel is however limited in the absence of oral contrast. No bowel dilatation or evidence of obstruction. There is colonic diverticulosis without active inflammatory changes. Normal appendix. Vascular/Lymphatic: There is moderate aortoiliac atherosclerotic disease. No portal venous gas. There is  no adenopathy. Reproductive: The uterus is enlarged and myomatous. Other: None Musculoskeletal: Mild degenerative changes of the lower lumbar spine. No acute osseous pathology. IMPRESSION: 1. Findings most consistent with enteritis or gastroenteritis. Clinical correlation is recommended. No evidence of bowel obstruction. Normal appendix. 2. Colonic diverticulosis without active inflammatory changes. 3. Small ascites which may be related to enteritis or underlying liver disease. 4. Cholelithiasis. 5. Enlarged myomatous uterus. 6.  Aortic Atherosclerosis (ICD10-I70.0). Electronically Signed   By: Anner Crete M.D.   On: 09/10/2017 22:10    EKG: Orders placed or performed during the hospital encounter of 09/10/17  . EKG 12-Lead  . EKG 12-Lead    IMPRESSION AND PLAN:  1.  Acute gastroenteritis.  Continue supportive measures and monitor clinically closely. 2.  Acute UTI, started on Rocephin IV, while waiting for final urine culture results. 3.  Acute renal failure on CKD 3.  We will start gentle IV hydration and monitor kidney function closely.  Avoid nephrotoxic medication. 4.  CHF, clinically compensated, continue medical treatment. 5.  Diabetes type 2, stable, continue insulin treatment and monitor blood sugars before meals and at bedtime.  All the records are reviewed and case discussed with ED provider. Management plans discussed with the patient, family and they are in agreement.  CODE STATUS:    Code Status Orders  (From admission, onward)        Start     Ordered   09/11/17 0156  Full code  Continuous     09/11/17 0155    Code Status History    Date Active Date Inactive Code Status Order ID Comments User Context   04/19/2017 08:13 05/05/2017 16:57 Full Code [id number]  Hillary Bow, MD ED   11/07/2016 09:02 11/10/2016 21:16 Full Code [id number]  Hillary Bow, MD ED   03/27/2015 19:41 03/31/2015 15:34 Full Code [id number]  Dustin Flock, MD ED       TOTAL TIME TAKING CARE  OF THIS PATIENT: 40 minutes.    Amelia Jo M.D on 09/11/2017 at 5:05 AM  Between 7am to 6pm - Pager - [phone]  After 6pm go to [email or web address] - password [email or web address] [facility]  [place]  Office  [phone]  CC: Primary care physician; Steele Sizer, MD

## 2017-09-12 LAB — GLUCOSE, CAPILLARY
GLUCOSE-CAPILLARY: 145 mg/dL — AB (ref 65–99)
GLUCOSE-CAPILLARY: 141 mg/dL — AB (ref 65–99)
Glucose-Capillary: 184 mg/dL — ABNORMAL HIGH (ref 65–99)
Glucose-Capillary: 159 mg/dL — ABNORMAL HIGH (ref 65–99)

## 2017-09-12 LAB — BASIC METABOLIC PANEL
ANION GAP: 11 (ref 5–15)
BUN: 62 mg/dL — ABNORMAL HIGH (ref 6–20)
CALCIUM: 7.8 mg/dL — AB (ref 8.9–10.3)
CO2: 22 mmol/L (ref 22–32)
CREATININE: 2.89 mg/dL — AB (ref 0.44–1.00)
Chloride: 107 mmol/L (ref 101–111)
GFR, EST AFRICAN AMERICAN: 18 mL/min — AB (ref >60)
GFR, EST NON AFRICAN AMERICAN: 15 mL/min — AB (ref >60)
Glucose, Bld: 174 mg/dL — ABNORMAL HIGH (ref 65–99)
Potassium: 5.2 mmol/L — ABNORMAL HIGH (ref 3.5–5.1)
Sodium: 140 mmol/L (ref 135–145)

## 2017-09-12 LAB — C DIFFICILE QUICK SCREEN W PCR REFLEX
C DIFFICILE (CDIFF) INTERP: NOT DETECTED
C DIFFICILE (CDIFF) TOXIN: NEGATIVE
C DIFFICLE (CDIFF) ANTIGEN: NEGATIVE

## 2017-09-12 LAB — CBC
HEMATOCRIT: 23.8 % — AB (ref 35.0–47.0)
Hemoglobin: 7.3 g/dL — ABNORMAL LOW (ref 12.0–16.0)
MCH: 23.6 pg — ABNORMAL LOW (ref 26.0–34.0)
MCHC: 30.7 g/dL — AB (ref 32.0–36.0)
MCV: 76.9 fL — ABNORMAL LOW (ref 80.0–100.0)
PLATELETS: 313 10*3/uL (ref 150–440)
RBC: 3.09 MIL/uL — ABNORMAL LOW (ref 3.80–5.20)
RDW: 16.5 % — AB (ref 11.5–14.5)
WBC: 22.7 10*3/uL — ABNORMAL HIGH (ref 3.6–11.0)

## 2017-09-12 MED ORDER — CHLORHEXIDINE GLUCONATE CLOTH 2 % EX PADS
6.0000 | MEDICATED_PAD | Freq: Every day | CUTANEOUS | Status: DC
  Administered 2017-09-12 – 2017-09-15 (×4): 6 via TOPICAL

## 2017-09-12 MED ORDER — MUPIROCIN 2 % EX OINT
1.0000 "application " | TOPICAL_OINTMENT | Freq: Two times a day (BID) | CUTANEOUS | Status: DC
  Administered 2017-09-12 – 2017-09-15 (×7): 1 via NASAL
  Filled 2017-09-12: qty 22

## 2017-09-12 MED ORDER — SODIUM CHLORIDE 0.9 % IV SOLN
500.0000 mg | Freq: Two times a day (BID) | INTRAVENOUS | Status: DC
  Administered 2017-09-12 (×2): 500 mg via INTRAVENOUS
  Filled 2017-09-12 (×4): qty 0.5

## 2017-09-12 NOTE — Progress Notes (Signed)
Pharmacy Antibiotic Note  Veronica Anderson is a 72 y.o. female admitted on 09/10/2017 with UTI.  Pharmacy has been consulted for Meropenem dosing.  Plan: Meropenem 500mg  IV every 12 hours.  Height: 5\' 4"  (162.6 cm) Weight: 294 lb 5 oz (133.5 kg) IBW/kg (Calculated) : 54.7  Temp (24hrs), Avg:99 F (37.2 C), Min:98.2 F (36.8 C), Max:99.6 F (37.6 C)  Recent Labs  Lab 09/10/17 2002 09/10/17 2132 09/11/17 0439 09/12/17 0526  WBC  --  15.2* 22.8* 22.7*  CREATININE 2.05*  --  2.53* 2.89*    Estimated Creatinine Clearance: 24.3 mL/min (A) (by C-G formula based on SCr of 2.89 mg/dL (H)).    Allergies  Allergen Reactions  . Ace Inhibitors Cough    Antimicrobials this admission: Ceftriaxone 3/1 >> 3/2 Meropenem 3/2 >>   Dose adjustments this admission: N/A  Microbiology results: 2/28 UCx: pending  3/1 MRSA PCR: positive 3/2 C Diff: negative  Thank you for allowing pharmacy to be a part of this patient's care.  Olivia Canter, [facility] 09/12/2017 7:53 AM

## 2017-09-12 NOTE — Progress Notes (Signed)
[facility] [place] NAME: Veronica Anderson    MR#:  [id number]  DATE OF BIRTH:  [birth year]  SUBJECTIVE:  CHIEF COMPLAINT:   Chief Complaint  Patient presents with  . Vaginal Bleeding  . Abdominal Pain   - Feels better today. No further diarrhea, improved abdominal pain -WBC still elevated. Renal function is worsening and decreased urine output  REVIEW OF SYSTEMS:  Review of Systems  Constitutional: Negative for chills and fever.  HENT: Positive for hearing loss. Negative for congestion, ear discharge and nosebleeds.   Eyes: Negative for blurred vision and double vision.  Respiratory: Negative for cough, shortness of breath and wheezing.   Cardiovascular: Negative for chest pain and palpitations.  Gastrointestinal: Positive for abdominal pain. Negative for constipation, diarrhea, nausea and vomiting.  Genitourinary: Negative for dysuria and urgency.  Musculoskeletal: Positive for back pain and myalgias.  Neurological: Negative for dizziness, speech change, focal weakness, seizures and headaches.  Psychiatric/Behavioral: Negative for depression.    DRUG ALLERGIES:   Allergies  Allergen Reactions  . Ace Inhibitors Cough    VITALS:  Blood pressure 136/61, pulse 74, temperature 99.3 F (37.4 C), temperature source Oral, resp. rate 20, height 5\' 4"  (1.626 m), weight 133.5 kg (294 lb 5 oz), SpO2 94 %.  PHYSICAL EXAMINATION:  Physical Exam  GENERAL:  72 y.o.-year-old obese patient lying in the bed with no acute distress.  EYES: Pupils equal, round, reactive to light and accommodation. No scleral icterus. Extraocular muscles intact.  HEENT: Head atraumatic, normocephalic. Oropharynx and nasopharynx clear.  NECK:  Supple, no jugular venous distention. No thyroid enlargement, no tenderness.  LUNGS: Normal breath sounds bilaterally, no wheezing, rales,rhonchi or crepitation. No use of accessory muscles of respiration. Decreased bibasilar  breath sounds CARDIOVASCULAR: S1, S2 normal. No   rubs, or gallops. 2/6 systolic murmur present ABDOMEN: Soft, improved tenderness in the lower quadrant, nondistended. Bowel sounds present. No organomegaly or mass.  EXTREMITIES: No pedal edema, cyanosis, or clubbing.  NEUROLOGIC: Cranial nerves II through XII are intact. Muscle strength 5/5 in all extremities. Sensation intact. Gait not checked.  PSYCHIATRIC: The patient is alert and oriented x 3.  SKIN: No obvious rash, lesion, or ulcer.    LABORATORY PANEL:   CBC Recent Labs  Lab 09/12/17 0526  WBC 22.7*  HGB 7.3*  HCT 23.8*  PLT 313   ------------------------------------------------------------------------------------------------------------------  Chemistries  Recent Labs  Lab 09/10/17 2002  09/12/17 0526  NA 142   < > 140  K 4.4   < > 5.2*  CL 108   < > 107  CO2 21*   < > 22  GLUCOSE 195*   < > 174*  BUN 47*   < > 62*  CREATININE 2.05*   < > 2.89*  CALCIUM 8.6*   < > 7.8*  AST 23  --   --   ALT 9*  --   --   ALKPHOS 66  --   --   BILITOT 0.6  --   --    < > = values in this interval not displayed.   ------------------------------------------------------------------------------------------------------------------  Cardiac Enzymes No results for input(s): TROPONINI in the last 168 hours. ------------------------------------------------------------------------------------------------------------------  RADIOLOGY:  Ct Abdomen Pelvis Wo Contrast  Result Date: 09/10/2017 CLINICAL DATA:  72 year old female with abdominal pain. EXAM: CT ABDOMEN AND PELVIS WITHOUT CONTRAST TECHNIQUE: Multidetector CT imaging of the abdomen and pelvis was performed following the standard protocol without IV contrast. COMPARISON:  Renal  ultrasound dated 04/25/2017 and abdominal radiograph dated 04/26/2017 FINDINGS: Evaluation of this exam is limited in the absence of intravenous contrast. Lower chest: The visualized lung bases are clear.  Mild cardiomegaly. No intra-abdominal free air.  Small ascites Hepatobiliary: Mild irregularity of the liver contour may represent early changes of cirrhosis. Clinical correlation is recommended. No intrahepatic biliary ductal dilatation. There is layering stone within the gallbladder. No pericholecystic fluid. Pancreas: Unremarkable. No pancreatic ductal dilatation or surrounding inflammatory changes. Spleen: Normal in size without focal abnormality. Adrenals/Urinary Tract: The adrenal glands, kidneys, visualized ureters, and urinary bladder appear unremarkable. Stomach/Bowel: There is a small hiatal hernia with gastroesophageal reflux. Liquid content noted within the stomach. There is inflammatory changes and thickening of multiple loops of small bowel throughout the abdomen most consistent with enteritis. Evaluation of the bowel is however limited in the absence of oral contrast. No bowel dilatation or evidence of obstruction. There is colonic diverticulosis without active inflammatory changes. Normal appendix. Vascular/Lymphatic: There is moderate aortoiliac atherosclerotic disease. No portal venous gas. There is no adenopathy. Reproductive: The uterus is enlarged and myomatous. Other: None Musculoskeletal: Mild degenerative changes of the lower lumbar spine. No acute osseous pathology. IMPRESSION: 1. Findings most consistent with enteritis or gastroenteritis. Clinical correlation is recommended. No evidence of bowel obstruction. Normal appendix. 2. Colonic diverticulosis without active inflammatory changes. 3. Small ascites which may be related to enteritis or underlying liver disease. 4. Cholelithiasis. 5. Enlarged myomatous uterus. 6.  Aortic Atherosclerosis (ICD10-I70.0). Electronically Signed   By: Anner Crete M.D.   On: 09/10/2017 22:10   US Renal  Result Date: 09/11/2017 CLINICAL DATA:  Acute renal failure EXAM: RENAL / URINARY TRACT ULTRASOUND COMPLETE COMPARISON:  None. FINDINGS: Right Kidney:  Length: 10.1 cm. Echogenicity within normal limits. No mass or hydronephrosis visualized. Left Kidney: Length: 10.4 cm. Echogenicity within normal limits. No mass or hydronephrosis visualized. Bladder: Partially decompressed. Appears normal for degree of bladder distention. IMPRESSION: Normal renal ultrasound. Electronically Signed   By: Franki Cabot M.D.   On: 09/11/2017 15:51    EKG:   Orders placed or performed during the hospital encounter of 09/10/17  . EKG 12-Lead  . EKG 12-Lead    ASSESSMENT AND PLAN:   72 year old female with past medical history significant for CK D stage III, type 2 diabetes mellitus, CAD, COPD, congestive heart failure, recent vaginal bleeding following up at [facility] presents to hospital secondary to abdominal pain, nausea and vomiting.  1. Abdominal pain with nausea and vomiting-CT of the abdomen showing possible changes of small bowel likely consistent with enteritis/gastroenteritis. -Much improved symptoms. Advance diet. - Hold off on antibiotics for enteritis at this time. -If further loose stools present, send for stool studies.  2. Acute cystitis-urine cultures are pending -Received Rocephin, but leukocytosis is still persistent. Changed to meropenem. -Keep an eye out for diarrhea-check for C. difficile if present.  3. Acute renal failure on CKD- Baseline creatinine seems to be around 1.4-1.5. -Hold nephrotoxins. Patient was on Lasix at home -Continue IV fluids as BUN and creatinine are still elevated. -Renal ultrasound is normal. Since worsening renal function, will consult nephrology. Continue strict input and output monitoring still   4. COPD and obstructive sleep apnea-continue CPAP at bedtime. -Chronically on 2 L oxygen  5. Hypertension-continue Norvasc and clonidine at this time. Hold off on hydralazine until blood pressure  improves  6. Vaginal bleeding-  started on Provera by [facility] recently. Has a follow-up appointment for possible D&C and  biopsy  7. Diabetes mellitus-continue Lantus and sliding scale insulin.  8. DVT prophylaxis heparin subcutaneous heparin  9. Acute on chronic anemia-known history of anemia of chronic disease requiring transfusions. Also dealing with vaginal bleeding recently. Hemoglobin has dropped to 7.3. Monitor and transfuse if hemoglobin is less than 7. Patient agreeable  Patient lives at home with family. She takes very minimal steps with a walker. Her Sons help her at home Son updated at bedside   All the records are reviewed and case discussed with Care Management/Social Workerr. Management plans discussed with the patient, family and they are in agreement.  CODE STATUS: Full Code  TOTAL TIME TAKING CARE OF THIS PATIENT: 38 minutes.   POSSIBLE D/C IN 2-3 DAYS, DEPENDING ON CLINICAL CONDITION.   Gladstone Lighter M.D on 09/12/2017 at 11:53 AM  Between 7am to 6pm - Pager - [phone]  After 6pm go to [email or web address] - password [email or web address] [place] [facility]  Office  [phone]  CC: Primary care physician; Steele Sizer, MD

## 2017-09-12 NOTE — Progress Notes (Signed)
[facility]  ROUNDING NOTE   Subjective:   Veronica Anderson admitted to [facility] on 09/10/2017 for Epigastric pain [R10.13] Acute cystitis with hematuria [N30.01] Nausea and vomiting, intractability of vomiting not specified, unspecified vomiting type [R11.2]  Creatinine 2.89 92.53) K 5.2  NS at 158mL/hr  Empiric meropenem  Objective:  Vital signs in last 24 hours:  Temp:  [98.2 F (36.8 C)-99.6 F (37.6 C)] 99.3 F (37.4 C) (03/02 0549) Pulse Rate:  [64-81] 74 (03/02 0549) Resp:  [20-21] 20 (03/02 0549) BP: (133-156)/(42-61) 136/61 (03/02 0549) SpO2:  [94 %-100 %] 94 % (03/02 0549) Weight:  [133.5 kg (294 lb 5 oz)] 133.5 kg (294 lb 5 oz) (03/02 0548)  Weight change: 1.957 kg (4 lb 5 oz) Filed Weights   09/10/17 1935 09/11/17 0154 09/12/17 0548  Weight: 131.5 kg (290 lb) 132.1 kg (291 lb 3.6 oz) 133.5 kg (294 lb 5 oz)    Intake/Output: I/O last 3 completed shifts: In: 2122 [I.V.:1922; IV Piggyback:200] Out: 200 [Urine:200]   Intake/Output this shift:  Total I/O In: 220 [P.O.:120; IV Piggyback:100] Out: -   Physical Exam: General: NAD,   Head: Normocephalic, atraumatic. Moist oral mucosal membranes  Eyes: Anicteric, PERRL  Neck: Supple, trachea midline  Lungs:  Clear to auscultation  Heart: Regular rate and rhythm  Abdomen:  Soft, nontender,   Extremities:  no peripheral edema.  Neurologic: Nonfocal, moving all four extremities  Skin: No lesions       Basic Metabolic Panel: Recent Labs  Lab 09/10/17 2002 09/11/17 0439 09/12/17 0526  NA 142 140 140  K 4.4 4.9 5.2*  CL 108 106 107  CO2 21* 23 22  GLUCOSE 195* 275* 174*  BUN 47* 47* 62*  CREATININE 2.05* 2.53* 2.89*  CALCIUM 8.6* 8.2* 7.8*    Liver Function Tests: Recent Labs  Lab 09/10/17 2002  AST 23  ALT 9*  ALKPHOS 66  BILITOT 0.6  PROT 7.7  ALBUMIN 3.2*   Recent Labs  Lab 09/10/17 2002  LIPASE 25   No results for input(s): AMMONIA in the last 168 hours.  CBC: Recent  Labs  Lab 09/10/17 2132 09/11/17 0439 09/12/17 0526  WBC 15.2* 22.8* 22.7*  NEUTROABS 13.9*  --   --   HGB 8.9* 8.0* 7.3*  HCT 28.8* 26.7* 23.8*  MCV 76.9* 77.3* 76.9*  PLT 328 336 313    Cardiac Enzymes: No results for input(s): CKTOTAL, CKMB, CKMBINDEX, TROPONINI in the last 168 hours.  BNP: Invalid input(s): POCBNP  CBG: Recent Labs  Lab 09/11/17 0728 09/11/17 1146 09/11/17 1655 09/11/17 2110 09/12/17 0754  GLUCAP 275* 279* 217* 165* 184*    Microbiology: Results for orders placed or performed during the hospital encounter of 09/10/17  Urine culture     Status: Abnormal (Preliminary result)   Collection Time: 09/10/17 10:14 PM  Result Value Ref Range Status   Specimen Description   Final    URINE, RANDOM Performed at [facility], [address], [place], [place] [address]    Special Requests   Final    NONE Performed at [facility], [address], [place], [place] [address]    Culture (A)  Final    >=100,000 COLONIES/mL ESCHERICHIA COLI SUSCEPTIBILITIES TO FOLLOW Performed at [facility], [place] [address], [place], [place] [address]    Report Status PENDING  Incomplete  MRSA PCR Screening     Status: Abnormal   Collection Time: 09/11/17  2:55 AM  Result  Value Ref Range Status   MRSA by PCR POSITIVE (A) NEGATIVE Final    Comment:        The GeneXpert MRSA Assay (FDA approved for NASAL specimens only), is one component of a comprehensive MRSA colonization surveillance program. It is not intended to diagnose MRSA infection nor to guide or monitor treatment for MRSA infections. RESULT CALLED TO, READ BACK BY AND VERIFIED WITH: STEPHEN SYKES AT 0512 ON 09/11/17 [place]. Performed at [facility], [address] [place], [place] [address]   C difficile quick scan w PCR reflex     Status: None   Collection Time: 09/12/17 12:57 AM  Result Value Ref Range Status   C Diff antigen NEGATIVE NEGATIVE Final   C Diff toxin  NEGATIVE NEGATIVE Final   C Diff interpretation No C. difficile detected.  Final    Comment: Performed at [facility], [address] [place], [place] [address]    Coagulation Studies: No results for input(s): LABPROT, INR in the last 72 hours.  Urinalysis: Recent Labs    09/10/17 2214  COLORURINE YELLOW*  LABSPEC 1.016  PHURINE 5.0  GLUCOSEU NEGATIVE  HGBUR LARGE*  BILIRUBINUR NEGATIVE  KETONESUR NEGATIVE  PROTEINUR 100*  NITRITE NEGATIVE  LEUKOCYTESUR MODERATE*      Imaging: Ct Abdomen Pelvis Wo Contrast  Result Date: 09/10/2017 CLINICAL DATA:  72 year old female with abdominal pain. EXAM: CT ABDOMEN AND PELVIS WITHOUT CONTRAST TECHNIQUE: Multidetector CT imaging of the abdomen and pelvis was performed following the standard protocol without IV contrast. COMPARISON:  Renal ultrasound dated 04/25/2017 and abdominal radiograph dated 04/26/2017 FINDINGS: Evaluation of this exam is limited in the absence of intravenous contrast. Lower chest: The visualized lung bases are clear. Mild cardiomegaly. No intra-abdominal free air.  Small ascites Hepatobiliary: Mild irregularity of the liver contour may represent early changes of cirrhosis. Clinical correlation is recommended. No intrahepatic biliary ductal dilatation. There is layering stone within the gallbladder. No pericholecystic fluid. Pancreas: Unremarkable. No pancreatic ductal dilatation or surrounding inflammatory changes. Spleen: Normal in size without focal abnormality. Adrenals/Urinary Tract: The adrenal glands, kidneys, visualized ureters, and urinary bladder appear unremarkable. Stomach/Bowel: There is a small hiatal hernia with gastroesophageal reflux. Liquid content noted within the stomach. There is inflammatory changes and thickening of multiple loops of small bowel throughout the abdomen most consistent with enteritis. Evaluation of the bowel is however limited in the absence of oral contrast. No bowel dilatation or  evidence of obstruction. There is colonic diverticulosis without active inflammatory changes. Normal appendix. Vascular/Lymphatic: There is moderate aortoiliac atherosclerotic disease. No portal venous gas. There is no adenopathy. Reproductive: The uterus is enlarged and myomatous. Other: None Musculoskeletal: Mild degenerative changes of the lower lumbar spine. No acute osseous pathology. IMPRESSION: 1. Findings most consistent with enteritis or gastroenteritis. Clinical correlation is recommended. No evidence of bowel obstruction. Normal appendix. 2. Colonic diverticulosis without active inflammatory changes. 3. Small ascites which may be related to enteritis or underlying liver disease. 4. Cholelithiasis. 5. Enlarged myomatous uterus. 6.  Aortic Atherosclerosis (ICD10-I70.0). Electronically Signed   By: Anner Crete M.D.   On: 09/10/2017 22:10   US Renal  Result Date: 09/11/2017 CLINICAL DATA:  Acute renal failure EXAM: RENAL / URINARY TRACT ULTRASOUND COMPLETE COMPARISON:  None. FINDINGS: Right Kidney: Length: 10.1 cm. Echogenicity within normal limits. No mass or hydronephrosis visualized. Left Kidney: Length: 10.4 cm. Echogenicity within normal limits. No mass or hydronephrosis visualized. Bladder: Partially decompressed. Appears normal for degree of bladder distention. IMPRESSION: Normal  renal ultrasound. Electronically Signed   By: Franki Cabot M.D.   On: 09/11/2017 15:51     Medications:   . sodium chloride 100 mL/hr at 09/12/17 0332  . meropenem (MERREM) IV Stopped (09/12/17 1002)   . amLODipine  10 mg Oral Daily  . Chlorhexidine Gluconate Cloth  6 each Topical Q0600  . [START ON 09/20/2017] cloNIDine  0.3 mg Transdermal Weekly  . docusate sodium  100 mg Oral BID  . famotidine  20 mg Oral Daily  . gabapentin  300 mg Oral TID  . heparin  5,000 Units Subcutaneous Q8H  . insulin aspart  0-5 Units Subcutaneous QHS  . insulin aspart  0-9 Units Subcutaneous TID WC  . insulin aspart  3  Units Subcutaneous TID WC  . insulin glargine  25 Units Subcutaneous Q2200  . mouth rinse  15 mL Mouth Rinse BID  . medroxyPROGESTERone  20 mg Oral Daily  . metoprolol tartrate  100 mg Oral BID  . montelukast  10 mg Oral Daily  . mupirocin ointment  1 application Nasal BID  . pravastatin  40 mg Oral Daily   acetaminophen **OR** acetaminophen, bisacodyl, HYDROcodone-acetaminophen, morphine injection, ondansetron **OR** ondansetron (ZOFRAN) IV, promethazine, traZODone  Assessment/ Plan:  [name] is a 72 y.o. black female with with anemia, congestive heart failure, diabetes mellitus type 2, peripheral edema, GERD, hypertension, hyperlipidemia, peripheral neuropathy, obesity, history of CVA, obstructive sleep apnea  1. Acute renal failure with hyperkalemia on chronic kidney disease stage III with proteinuria: baseline creatinine 1.61, GFR of 37 on 07/29/17 Chronic kidney disease secondary to diabetes and hypertension.  Acute renal failure from prerenal azotemia - Continue IV fluids, monitor closely as patient has diastolic CHF. NS at 84mL/hr  2. Urinary tract infection: E. Coli on urine culture from 2/28 - Empiric meropenem  3. Anemia with kidney failure: hemoglobin 7.3. Microcytic with labs from 10/18 consistent with iron deficiency. History of GYN bleeding - being followed by [facility] and is waiting cardiac clearance before surgical intervention.   4. Hypertension: blood pressure at goal. Holding furosemide - metoprolol, clonidine and amlodipine    LOS: 1 Veronica Anderson 3/2/201911:48 AM

## 2017-09-13 LAB — RENAL FUNCTION PANEL
ALBUMIN: 2.2 g/dL — AB (ref 3.5–5.0)
Anion gap: 12 (ref 5–15)
BUN: 69 mg/dL — ABNORMAL HIGH (ref 6–20)
CALCIUM: 7.8 mg/dL — AB (ref 8.9–10.3)
CHLORIDE: 107 mmol/L (ref 101–111)
CO2: 21 mmol/L — ABNORMAL LOW (ref 22–32)
CREATININE: 2.52 mg/dL — AB (ref 0.44–1.00)
GFR, EST AFRICAN AMERICAN: 21 mL/min — AB (ref >60)
GFR, EST NON AFRICAN AMERICAN: 18 mL/min — AB (ref >60)
Glucose, Bld: 168 mg/dL — ABNORMAL HIGH (ref 65–99)
PHOSPHORUS: 4.4 mg/dL (ref 2.5–4.6)
Potassium: 5 mmol/L (ref 3.5–5.1)
SODIUM: 140 mmol/L (ref 135–145)

## 2017-09-13 LAB — CBC
HCT: 24.4 % — ABNORMAL LOW (ref 35.0–47.0)
Hemoglobin: 7.4 g/dL — ABNORMAL LOW (ref 12.0–16.0)
MCH: 23.3 pg — ABNORMAL LOW (ref 26.0–34.0)
MCHC: 30.2 g/dL — ABNORMAL LOW (ref 32.0–36.0)
MCV: 77 fL — ABNORMAL LOW (ref 80.0–100.0)
PLATELETS: 350 10*3/uL (ref 150–440)
RBC: 3.16 MIL/uL — AB (ref 3.80–5.20)
RDW: 16.9 % — AB (ref 11.5–14.5)
WBC: 20.4 10*3/uL — AB (ref 3.6–11.0)

## 2017-09-13 LAB — URINE CULTURE: Culture: >=100000 — AB

## 2017-09-13 LAB — GLUCOSE, CAPILLARY
GLUCOSE-CAPILLARY: 149 mg/dL — AB (ref 65–99)
GLUCOSE-CAPILLARY: 139 mg/dL — AB (ref 65–99)
GLUCOSE-CAPILLARY: 105 mg/dL — AB (ref 65–99)
Glucose-Capillary: 130 mg/dL — ABNORMAL HIGH (ref 65–99)

## 2017-09-13 MED ORDER — SODIUM CHLORIDE 0.9 % IV SOLN
1.0000 g | INTRAVENOUS | Status: DC
  Administered 2017-09-13 – 2017-09-15 (×3): 1 g via INTRAVENOUS
  Filled 2017-09-13 (×4): qty 10

## 2017-09-13 NOTE — Progress Notes (Signed)
MD notified of pt having zero urine output and 156ml per bladder scan. MD aware of pt CHF and CKD history. No new orders at this time - (Md does not want to bolus due to fear of fluid overload). Will continue to monitor output and Bladder scan in AM.

## 2017-09-13 NOTE — Progress Notes (Signed)
[facility] [facility] [place] NAME: Veronica Anderson    MR#:  [id number]  DATE OF BIRTH:  Jun 11, 1946  SUBJECTIVE:  CHIEF COMPLAINT:   Chief Complaint  Patient presents with  . Vaginal Bleeding  . Abdominal Pain   - Feels better. WBC coming down. Urine cultures with sensitivity Escherichia coli. -Renal function slowly improving. Oliguric last night and fluids restarted  REVIEW OF SYSTEMS:  Review of Systems  Constitutional: Negative for chills and fever.  HENT: Positive for hearing loss. Negative for congestion, ear discharge and nosebleeds.   Eyes: Negative for blurred vision and double vision.  Respiratory: Negative for cough, shortness of breath and wheezing.   Cardiovascular: Negative for chest pain and palpitations.  Gastrointestinal: Positive for abdominal pain. Negative for constipation, diarrhea, nausea and vomiting.  Genitourinary: Negative for dysuria and urgency.  Musculoskeletal: Positive for back pain and myalgias.  Neurological: Negative for dizziness, speech change, focal weakness, seizures and headaches.  Psychiatric/Behavioral: Negative for depression.    DRUG ALLERGIES:   Allergies  Allergen Reactions  . Ace Inhibitors Cough    VITALS:  Blood pressure (!) 114/27, pulse 70, temperature 98.1 F (36.7 C), temperature source Oral, resp. rate 20, height 5\' 4"  (1.626 m), weight 134.9 kg (297 lb 6.4 oz), SpO2 94 %.  PHYSICAL EXAMINATION:  Physical Exam  GENERAL:  72 y.o.-year-old obese patient lying in the bed with no acute distress.  EYES: Pupils equal, round, reactive to light and accommodation. No scleral icterus. Extraocular muscles intact.  HEENT: Head atraumatic, normocephalic. Oropharynx and nasopharynx clear.  NECK:  Supple, no jugular venous distention. No thyroid enlargement, no tenderness.  LUNGS: Normal breath sounds bilaterally, no wheezing, rales,rhonchi or crepitation. No use of accessory muscles of respiration.  Decreased bibasilar breath sounds CARDIOVASCULAR: S1, S2 normal. No   rubs, or gallops. 2/6 systolic murmur present ABDOMEN: Soft, improved tenderness in the lower quadrant, nondistended. Bowel sounds present. No organomegaly or mass.  EXTREMITIES: No pedal edema, cyanosis, or clubbing.  NEUROLOGIC: Cranial nerves II through XII are intact. Muscle strength 5/5 in all extremities. Sensation intact. Gait not checked.  PSYCHIATRIC: The patient is alert and oriented x 3.  SKIN: No obvious rash, lesion, or ulcer.    LABORATORY PANEL:   CBC Recent Labs  Lab 09/13/17 0540  WBC 20.4*  HGB 7.4*  HCT 24.4*  PLT 350   ------------------------------------------------------------------------------------------------------------------  Chemistries  Recent Labs  Lab 09/10/17 2002  09/13/17 0540  NA 142   < > 140  K 4.4   < > 5.0  CL 108   < > 107  CO2 21*   < > 21*  GLUCOSE 195*   < > 168*  BUN 47*   < > 69*  CREATININE 2.05*   < > 2.52*  CALCIUM 8.6*   < > 7.8*  AST 23  --   --   ALT 9*  --   --   ALKPHOS 66  --   --   BILITOT 0.6  --   --    < > = values in this interval not displayed.   ------------------------------------------------------------------------------------------------------------------  Cardiac Enzymes No results for input(s): TROPONINI in the last 168 hours. ------------------------------------------------------------------------------------------------------------------  RADIOLOGY:  US Renal  Result Date: 09/11/2017 CLINICAL DATA:  Acute renal failure EXAM: RENAL / URINARY TRACT ULTRASOUND COMPLETE COMPARISON:  None. FINDINGS: Right Kidney: Length: 10.1 cm. Echogenicity within normal limits. No mass or hydronephrosis visualized. Left Kidney: Length: 10.4 cm. Echogenicity  within normal limits. No mass or hydronephrosis visualized. Bladder: Partially decompressed. Appears normal for degree of bladder distention. IMPRESSION: Normal renal ultrasound. Electronically  Signed   By: Franki Cabot M.D.   On: 09/11/2017 15:51    EKG:   Orders placed or performed during the hospital encounter of 09/10/17  . EKG 12-Lead  . EKG 12-Lead    ASSESSMENT AND PLAN:   72 year old female with past medical history significant for CK D stage III, type 2 diabetes mellitus, CAD, COPD, congestive heart failure, recent vaginal bleeding following up at [facility] presents to hospital secondary to abdominal pain, nausea and vomiting.  1. Abdominal pain with nausea and vomiting-CT of the abdomen showing possible changes of small bowel likely consistent with enteritis/gastroenteritis. -Much improved symptoms. Advance diet. - Hold off on antibiotics for enteritis at this time. -If further loose stools present, send for stool studies.  2. Acute cystitis-urine cultures are growing Escherichia coli sensitive to Rocephin. -Changed to meropenem to Rocephin. WBC is still elevated. Monitor closely -Keep an eye out for diarrhea-check for C. difficile if present.  3. Acute renal failure on CKD- Baseline creatinine seems to be around 1.4-1.5. -Hold nephrotoxins. Patient was on Lasix at home -Continue IV fluids as BUN and creatinine are still elevated. -Renal ultrasound is normal.  -Appreciate nephrology consult  4. COPD and obstructive sleep apnea-continue CPAP at bedtime. -Chronically on 2 L oxygen  5. Hypertension-continue Norvasc and clonidine at this time. Hold off on hydralazine until blood pressure  improves  6. Vaginal bleeding-  started on Provera by [facility] recently. Has a follow-up appointment for possible D&C and biopsy  7. Diabetes mellitus-continue Lantus and sliding scale insulin.  8. DVT prophylaxis heparin subcutaneous heparin  9. Acute on chronic anemia-known history of anemia of chronic disease requiring transfusions. Also dealing with vaginal bleeding recently. Hemoglobin has dropped to 7.4. Monitor and transfuse if hemoglobin is less than 7. Patient  agreeable  Patient lives at home with family. She takes very minimal steps with a walker. Her Sons help her at home Son updated at bedside Physical therapy consult   All the records are reviewed and case discussed with Care Management/Social Workerr. Management plans discussed with the patient, family and they are in agreement.  CODE STATUS: Full Code  TOTAL TIME TAKING CARE OF THIS PATIENT: 36 minutes.   POSSIBLE D/C IN 2-3 DAYS, DEPENDING ON CLINICAL CONDITION.   Gladstone Lighter M.D on 09/13/2017 at 10:48 AM  Between 7am to 6pm - Pager - [phone]  After 6pm go to [email or web address] - password [email or web address] [facility]  Office  [phone]  CC: Primary care physician; Steele Sizer, MD

## 2017-09-13 NOTE — Progress Notes (Signed)
MD notified of low DBP and low urine output. Per MD increase maintenance fluids to 75/hr and hold am BP meds. Will continue to monitor.

## 2017-09-13 NOTE — Progress Notes (Signed)
PT Cancellation Note  Patient Details Name: Veronica Anderson MRN: [id number] DOB: April 01, 1946   Cancelled Treatment:    Reason Eval/Treat Not Completed: Pain limiting ability to participate  Chart reviewed and PT attempted to check in with RN.  Pt was lethargic and visibly in pain when PT entered room.  PT discussed what a PT evaluation entails and pt stated that she is in too much pain and would like to wait until tomorrow.  PT will re-attempt 09/13/2017.   Roxanne Gates, PT, DPT 09/13/2017, 1:03 PM

## 2017-09-13 NOTE — Progress Notes (Signed)
Per MD okay to hold lantus tonight because of pt poor po intake.

## 2017-09-13 NOTE — Progress Notes (Signed)
[facility]  ROUNDING NOTE   Subjective:   Son at bedside.   NS at 43mL/hr  Patient states she is feeling better. Denies any more dysuria.   Creatinine 2.52 (2.89)  Objective:  Vital signs in last 24 hours:  Temp:  [98.1 F (36.7 C)-98.2 F (36.8 C)] 98.1 F (36.7 C) (03/03 0629) Pulse Rate:  [65-72] 70 (03/03 0629) Resp:  [15-20] 20 (03/03 0625) BP: (99-155)/(24-50) 114/27 (03/03 0629) SpO2:  [94 %-99 %] 94 % (03/03 0625) Weight:  [134.9 kg (297 lb 6.4 oz)] 134.9 kg (297 lb 6.4 oz) (03/03 0500)  Weight change: 1.4 kg (3 lb 1.4 oz) Filed Weights   09/11/17 0154 09/12/17 0548 09/13/17 0500  Weight: 132.1 kg (291 lb 3.6 oz) 133.5 kg (294 lb 5 oz) 134.9 kg (297 lb 6.4 oz)    Intake/Output: I/O last 3 completed shifts: In: 2987.8 [P.O.:120; I.V.:2686.8; IV Piggyback:181] Out: 350 [Urine:350]   Intake/Output this shift:  Total I/O In: 90.8 [I.V.:90.8] Out: -   Physical Exam: General: NAD, laying in bed  Head: Normocephalic, atraumatic. Moist oral mucosal membranes  Eyes: Anicteric, PERRL  Neck: Supple, trachea midline  Lungs:  Clear to auscultation  Heart: Regular rate and rhythm  Abdomen:  Soft, nontender, obese  Extremities:  no peripheral edema.  Neurologic: Nonfocal, moving all four extremities  Skin: No lesions, +hirsitute       Basic Metabolic Panel: Recent Labs  Lab 09/10/17 2002 09/11/17 0439 09/12/17 0526 09/13/17 0540  NA 142 140 140 140  K 4.4 4.9 5.2* 5.0  CL 108 106 107 107  CO2 21* 23 22 21*  GLUCOSE 195* 275* 174* 168*  BUN 47* 47* 62* 69*  CREATININE 2.05* 2.53* 2.89* 2.52*  CALCIUM 8.6* 8.2* 7.8* 7.8*  PHOS  --   --   --  4.4    Liver Function Tests: Recent Labs  Lab 09/10/17 2002 09/13/17 0540  AST 23  --   ALT 9*  --   ALKPHOS 66  --   BILITOT 0.6  --   PROT 7.7  --   ALBUMIN 3.2* 2.2*   Recent Labs  Lab 09/10/17 2002  LIPASE 25   No results for input(s): AMMONIA in the last 168  hours.  CBC: Recent Labs  Lab 09/10/17 2132 09/11/17 0439 09/12/17 0526 09/13/17 0540  WBC 15.2* 22.8* 22.7* 20.4*  NEUTROABS 13.9*  --   --   --   HGB 8.9* 8.0* 7.3* 7.4*  HCT 28.8* 26.7* 23.8* 24.4*  MCV 76.9* 77.3* 76.9* 77.0*  PLT 328 336 313 350    Cardiac Enzymes: No results for input(s): CKTOTAL, CKMB, CKMBINDEX, TROPONINI in the last 168 hours.  BNP: Invalid input(s): POCBNP  CBG: Recent Labs  Lab 09/12/17 0754 09/12/17 1243 09/12/17 1719 09/12/17 2233 09/13/17 0745  GLUCAP 184* 159* 145* 141* 149*    Microbiology: Results for orders placed or performed during the hospital encounter of 09/10/17  Urine culture     Status: Abnormal   Collection Time: 09/10/17 10:14 PM  Result Value Ref Range Status   Specimen Description   Final    URINE, RANDOM Performed at [facility], [address] [place], [place] [address]    Special Requests   Final    NONE Performed at [facility], [address] [place], [place] [address]    Culture >=100,000 COLONIES/mL ESCHERICHIA COLI (A)  Final   Report Status 09/13/2017 FINAL  Final   Organism ID, Bacteria  ESCHERICHIA COLI (A)  Final      Susceptibility   Escherichia coli - MIC*    AMPICILLIN >=32 RESISTANT Resistant     CEFAZOLIN 32 INTERMEDIATE Intermediate     CEFTRIAXONE <=1 SENSITIVE Sensitive     CIPROFLOXACIN >=4 RESISTANT Resistant     GENTAMICIN <=1 SENSITIVE Sensitive     IMIPENEM <=0.25 SENSITIVE Sensitive     NITROFURANTOIN <=16 SENSITIVE Sensitive     TRIMETH/SULFA <=20 SENSITIVE Sensitive     AMPICILLIN/SULBACTAM >=32 RESISTANT Resistant     PIP/TAZO <=4 SENSITIVE Sensitive     Extended ESBL NEGATIVE Sensitive     * >=100,000 COLONIES/mL ESCHERICHIA COLI  MRSA PCR Screening     Status: Abnormal   Collection Time: 09/11/17  2:55 AM  Result Value Ref Range Status   MRSA by PCR POSITIVE (A) NEGATIVE Final    Comment:        The GeneXpert MRSA Assay (FDA approved for NASAL  specimens only), is one component of a comprehensive MRSA colonization surveillance program. It is not intended to diagnose MRSA infection nor to guide or monitor treatment for MRSA infections. RESULT CALLED TO, READ BACK BY AND VERIFIED WITH: STEPHEN SYKES AT 0512 ON 09/11/17 [place]. Performed at [facility], [address] [place], [place] [address]   C difficile quick scan w PCR reflex     Status: None   Collection Time: 09/12/17 12:57 AM  Result Value Ref Range Status   C Diff antigen NEGATIVE NEGATIVE Final   C Diff toxin NEGATIVE NEGATIVE Final   C Diff interpretation No C. difficile detected.  Final    Comment: Performed at [facility], [address] [place], [place] [address]    Coagulation Studies: No results for input(s): LABPROT, INR in the last 72 hours.  Urinalysis: Recent Labs    09/10/17 2214  COLORURINE YELLOW*  LABSPEC 1.016  PHURINE 5.0  GLUCOSEU NEGATIVE  HGBUR LARGE*  BILIRUBINUR NEGATIVE  KETONESUR NEGATIVE  PROTEINUR 100*  NITRITE NEGATIVE  LEUKOCYTESUR MODERATE*      Imaging: US Renal  Result Date: 09/11/2017 CLINICAL DATA:  Acute renal failure EXAM: RENAL / URINARY TRACT ULTRASOUND COMPLETE COMPARISON:  None. FINDINGS: Right Kidney: Length: 10.1 cm. Echogenicity within normal limits. No mass or hydronephrosis visualized. Left Kidney: Length: 10.4 cm. Echogenicity within normal limits. No mass or hydronephrosis visualized. Bladder: Partially decompressed. Appears normal for degree of bladder distention. IMPRESSION: Normal renal ultrasound. Electronically Signed   By: Franki Cabot M.D.   On: 09/11/2017 15:51     Medications:   . sodium chloride 75 mL/hr at 09/13/17 0820  . cefTRIAXone (ROCEPHIN)  IV     . amLODipine  10 mg Oral Daily  . Chlorhexidine Gluconate Cloth  6 each Topical Q0600  . [START ON 09/20/2017] cloNIDine  0.3 mg Transdermal Weekly  . docusate sodium  100 mg Oral BID  . famotidine  20 mg Oral Daily  .  gabapentin  300 mg Oral TID  . heparin  5,000 Units Subcutaneous Q8H  . insulin aspart  0-5 Units Subcutaneous QHS  . insulin aspart  0-9 Units Subcutaneous TID WC  . insulin aspart  3 Units Subcutaneous TID WC  . insulin glargine  25 Units Subcutaneous Q2200  . mouth rinse  15 mL Mouth Rinse BID  . medroxyPROGESTERone  20 mg Oral Daily  . metoprolol tartrate  100 mg Oral BID  . montelukast  10 mg Oral Daily  . mupirocin ointment  1  application Nasal BID  . pravastatin  40 mg Oral Daily   acetaminophen **OR** acetaminophen, bisacodyl, HYDROcodone-acetaminophen, morphine injection, ondansetron **OR** ondansetron (ZOFRAN) IV, promethazine, traZODone  Assessment/ Plan:  Ms. Ryenn Howeth is a 72 y.o. black female with with anemia, congestive heart failure, diabetes mellitus type 2, peripheral edema, GERD, hypertension, hyperlipidemia, peripheral neuropathy, obesity, history of CVA, obstructive sleep apnea  1. Acute renal failure with hyperkalemia on chronic kidney disease stage III with proteinuria: baseline creatinine 1.61, GFR of 37 on 07/29/17 Chronic kidney disease secondary to diabetes and hypertension.  Acute renal failure from prerenal azotemia - Continue IV fluids, monitor closely as patient has diastolic CHF.    2. Urinary tract infection: E. Coli on urine culture from 2/28. Pending sensitivities - Empiric meropenem  3. Anemia with kidney failure: hemoglobin 7.4. Microcytic with labs from 10/18 consistent with iron deficiency. History of GYN bleeding - being followed by [facility]YN and is waiting cardiac clearance before surgical intervention.   4. Hypertension: bradycardia Holding furosemide - Hold metoprolol and clonidine - Continue amlodipine    LOS: 2 Cristoval Teall 3/3/20199:48 AM

## 2017-09-13 NOTE — Progress Notes (Signed)
Pharmacy Antibiotic Note  Veronica Anderson is a 72 y.o. female admitted on 09/10/2017 with UTI.  Pharmacy has been consulted for Ceftriaxone dosing.  Plan: Ceftriaxone 1g IV every 24 hours.  Height: 5\' 4"  (162.6 cm) Weight: 297 lb 6.4 oz (134.9 kg) IBW/kg (Calculated) : 54.7  Temp (24hrs), Avg:98.1 F (36.7 C), Min:98.1 F (36.7 C), Max:98.2 F (36.8 C)  Recent Labs  Lab 09/10/17 2002 09/10/17 2132 09/11/17 0439 09/12/17 0526 09/13/17 0540  WBC  --  15.2* 22.8* 22.7* 20.4*  CREATININE 2.05*  --  2.53* 2.89* 2.52*    Estimated Creatinine Clearance: 28.1 mL/min (A) (by C-G formula based on SCr of 2.52 mg/dL (H)).    Allergies  Allergen Reactions  . Ace Inhibitors Cough    Antimicrobials this admission: Ceftriaxone  3/1 >> 3/2 Meropenem  3/2  Ceftriaxone  3/3 >>  Dose adjustments this admission: N/A  Microbiology results: 2/28 UCx: E.Coli  3/1 MRSA PCR: positive  Thank you for allowing pharmacy to be a part of this patient's care.  Olivia Canter, [facility] 09/13/2017 9:20 AM

## 2017-09-14 LAB — CBC
HCT: 22.5 % — ABNORMAL LOW (ref 35.0–47.0)
HCT: 25.8 % — ABNORMAL LOW (ref 35.0–47.0)
HEMOGLOBIN: 6.7 g/dL — AB (ref 12.0–16.0)
Hemoglobin: 8.1 g/dL — ABNORMAL LOW (ref 12.0–16.0)
MCH: 23.1 pg — ABNORMAL LOW (ref 26.0–34.0)
MCH: 24.2 pg — AB (ref 26.0–34.0)
MCHC: 29.8 g/dL — ABNORMAL LOW (ref 32.0–36.0)
MCHC: 31.2 g/dL — ABNORMAL LOW (ref 32.0–36.0)
MCV: 77.6 fL — AB (ref 80.0–100.0)
MCV: 77.5 fL — ABNORMAL LOW (ref 80.0–100.0)
PLATELETS: 367 10*3/uL (ref 150–440)
PLATELETS: 354 10*3/uL (ref 150–440)
RBC: 2.9 MIL/uL — AB (ref 3.80–5.20)
RBC: 3.33 MIL/uL — AB (ref 3.80–5.20)
RDW: 16.7 % — ABNORMAL HIGH (ref 11.5–14.5)
RDW: 17.2 % — ABNORMAL HIGH (ref 11.5–14.5)
WBC: 16.8 10*3/uL — AB (ref 3.6–11.0)
WBC: 12.9 10*3/uL — AB (ref 3.6–11.0)

## 2017-09-14 LAB — BASIC METABOLIC PANEL
Anion gap: 8 (ref 5–15)
BUN: 61 mg/dL — ABNORMAL HIGH (ref 6–20)
CALCIUM: 7.7 mg/dL — AB (ref 8.9–10.3)
CO2: 21 mmol/L — ABNORMAL LOW (ref 22–32)
Chloride: 107 mmol/L (ref 101–111)
Creatinine, Ser: 1.64 mg/dL — ABNORMAL HIGH (ref 0.44–1.00)
GFR, EST AFRICAN AMERICAN: 35 mL/min — AB (ref >60)
GFR, EST NON AFRICAN AMERICAN: 30 mL/min — AB (ref >60)
Glucose, Bld: 212 mg/dL — ABNORMAL HIGH (ref 65–99)
Potassium: 4.1 mmol/L (ref 3.5–5.1)
SODIUM: 136 mmol/L (ref 135–145)

## 2017-09-14 LAB — PREPARE RBC (CROSSMATCH)

## 2017-09-14 LAB — RENAL FUNCTION PANEL
ANION GAP: 10 (ref 5–15)
Albumin: 1.9 g/dL — ABNORMAL LOW (ref 3.5–5.0)
BUN: 66 mg/dL — ABNORMAL HIGH (ref 6–20)
CALCIUM: 7.7 mg/dL — AB (ref 8.9–10.3)
CO2: 21 mmol/L — AB (ref 22–32)
Chloride: 109 mmol/L (ref 101–111)
Creatinine, Ser: 1.98 mg/dL — ABNORMAL HIGH (ref 0.44–1.00)
GFR calc non Af Amer: 24 mL/min — ABNORMAL LOW (ref >60)
GFR, EST AFRICAN AMERICAN: 28 mL/min — AB (ref >60)
Glucose, Bld: 133 mg/dL — ABNORMAL HIGH (ref 65–99)
POTASSIUM: 4.3 mmol/L (ref 3.5–5.1)
Phosphorus: 3.7 mg/dL (ref 2.5–4.6)
SODIUM: 140 mmol/L (ref 135–145)

## 2017-09-14 LAB — GLUCOSE, CAPILLARY
GLUCOSE-CAPILLARY: 196 mg/dL — AB (ref 65–99)
Glucose-Capillary: 135 mg/dL — ABNORMAL HIGH (ref 65–99)
Glucose-Capillary: 179 mg/dL — ABNORMAL HIGH (ref 65–99)
Glucose-Capillary: 187 mg/dL — ABNORMAL HIGH (ref 65–99)

## 2017-09-14 MED ORDER — SODIUM CHLORIDE 0.9 % IV SOLN
Freq: Once | INTRAVENOUS | Status: AC
  Administered 2017-09-14: via INTRAVENOUS

## 2017-09-14 MED ORDER — ALBUMIN HUMAN 25 % IV SOLN
25.0000 g | Freq: Every day | INTRAVENOUS | Status: DC
  Administered 2017-09-14: 25 g via INTRAVENOUS
  Filled 2017-09-14 (×2): qty 100

## 2017-09-14 MED ORDER — FERROUS SULFATE 325 (65 FE) MG PO TABS
325.0000 mg | ORAL_TABLET | Freq: Two times a day (BID) | ORAL | Status: DC
  Administered 2017-09-14 – 2017-09-15 (×2): 325 mg via ORAL
  Filled 2017-09-14 (×2): qty 1

## 2017-09-14 NOTE — Progress Notes (Signed)
[place] [facility] [place] NAME: Veronica Anderson    MR#:  [id number]  DATE OF BIRTH:  09-Feb-1946  SUBJECTIVE:  CHIEF COMPLAINT:   Chief Complaint  Patient presents with  . Vaginal Bleeding  . Abdominal Pain   - Feeling much better today. Abdominal pain is improving. Hemoglobin down to 6.7 -Renal function is improving, as is urine output  REVIEW OF SYSTEMS:  Review of Systems  Constitutional: Negative for chills and fever.  HENT: Positive for hearing loss. Negative for congestion, ear discharge and nosebleeds.   Eyes: Negative for blurred vision and double vision.  Respiratory: Negative for cough, shortness of breath and wheezing.   Cardiovascular: Negative for chest pain and palpitations.  Gastrointestinal: Positive for abdominal pain. Negative for constipation, diarrhea, nausea and vomiting.  Genitourinary: Negative for dysuria and urgency.  Musculoskeletal: Positive for back pain and myalgias.  Neurological: Negative for dizziness, speech change, focal weakness, seizures and headaches.  Psychiatric/Behavioral: Negative for depression.    DRUG ALLERGIES:   Allergies  Allergen Reactions  . Ace Inhibitors Cough    VITALS:  Blood pressure (!) 164/56, pulse 73, temperature 99.3 F (37.4 C), temperature source Axillary, resp. rate 20, height 5\' 4"  (1.626 m), weight 135.4 kg (298 lb 8.1 oz), SpO2 97 %.  PHYSICAL EXAMINATION:  Physical Exam  GENERAL:  72 y.o.-year-old obese patient lying in the bed with no acute distress.  EYES: Pupils equal, round, reactive to light and accommodation. No scleral icterus. Extraocular muscles intact.  HEENT: Head atraumatic, normocephalic. Oropharynx and nasopharynx clear.  NECK:  Supple, no jugular venous distention. No thyroid enlargement, no tenderness.  LUNGS: Normal breath sounds bilaterally, no wheezing, rales,rhonchi or crepitation. No use of accessory muscles of respiration. Decreased bibasilar  breath sounds CARDIOVASCULAR: S1, S2 normal. No   rubs, or gallops. 2/6 systolic murmur present ABDOMEN: Soft, improved tenderness in the lower quadrant, nondistended. Bowel sounds present. No organomegaly or mass.  EXTREMITIES: No pedal edema, cyanosis, or clubbing.  NEUROLOGIC: Cranial nerves II through XII are intact. Muscle strength 5/5 in all extremities. Sensation intact. Gait not checked.  PSYCHIATRIC: The patient is alert and oriented x 3.  SKIN: No obvious rash, lesion, or ulcer.    LABORATORY PANEL:   CBC Recent Labs  Lab 09/14/17 0438  WBC 16.8*  HGB 6.7*  HCT 22.5*  PLT 367   ------------------------------------------------------------------------------------------------------------------  Chemistries  Recent Labs  Lab 09/10/17 2002  09/14/17 0437  NA 142   < > 140  K 4.4   < > 4.3  CL 108   < > 109  CO2 21*   < > 21*  GLUCOSE 195*   < > 133*  BUN 47*   < > 66*  CREATININE 2.05*   < > 1.98*  CALCIUM 8.6*   < > 7.7*  AST 23  --   --   ALT 9*  --   --   ALKPHOS 66  --   --   BILITOT 0.6  --   --    < > = values in this interval not displayed.   ------------------------------------------------------------------------------------------------------------------  Cardiac Enzymes No results for input(s): TROPONINI in the last 168 hours. ------------------------------------------------------------------------------------------------------------------  RADIOLOGY:  No results found.  EKG:   Orders placed or performed during the hospital encounter of 09/10/17  . EKG 12-Lead  . EKG 12-Lead    ASSESSMENT AND PLAN:   72 year old female with past medical history significant for CK D stage  III, type 2 diabetes mellitus, CAD, COPD, congestive heart failure, recent vaginal bleeding following up at [facility] presents to hospital secondary to abdominal pain, nausea and vomiting.  1. Abdominal pain with nausea and vomiting-CT of the abdomen showing possible changes of  small bowel likely consistent with enteritis/gastroenteritis. -Much improved symptoms. Advance diet. - Hold off on antibiotics for enteritis at this time.  2. Acute cystitis-urine cultures are growing Escherichia coli sensitive to Rocephin. -on Rocephin. WBC is  Elevated, but slowly improving. Monitor closely -Keep an eye out for diarrhea-check for C. difficile if present.  3. Acute renal failure on CKD- Baseline creatinine seems to be around 1.6. Cr down to 1.98 today -Hold nephrotoxins. Patient was on Lasix at home -Continue IV fluids as BUN and creatinine are still elevated. -Renal ultrasound is normal.  -Appreciate nephrology consult  4. COPD and obstructive sleep apnea-continue CPAP at bedtime. -Chronically on 2 L oxygen  5. Hypertension-continue Norvasc and clonidine at this time. Hold off on hydralazine until blood pressure  improves  6. Vaginal bleeding-  started on Provera by [facility] GYN recently. Has a follow-up appointment for possible D&C and biopsy Likely might need hysterectomy  7. Diabetes mellitus-continue Lantus and sliding scale insulin.  8. DVT prophylaxis heparin subcutaneous heparin  9. Acute on chronic anemia-known history of anemia of chronic disease requiring transfusions. Also dealing with vaginal bleeding recently.  - Hemoglobin has dropped to 6.7.  - 1unit pRBC transfusion ordered  Patient lives at home with family. She takes very minimal steps with a walker. Her Sons help her at home Son updated at bedside Physical therapy consult   All the records are reviewed and case discussed with Care Management/Social Workerr. Management plans discussed with the patient, family and they are in agreement.  CODE STATUS: Full Code  TOTAL TIME TAKING CARE OF THIS PATIENT: 37 minutes.   POSSIBLE D/C TOMORROW, DEPENDING ON CLINICAL CONDITION.   Gladstone Lighter M.D on 09/14/2017 at 12:23 PM  Between 7am to 6pm - Pager - [phone]  After 6pm go to  [email or web address] - password [email or web address] [facility]  Office  [phone]  CC: Primary care physician; Steele Sizer, MD

## 2017-09-14 NOTE — Progress Notes (Signed)
[facility]  ROUNDING NOTE   Subjective:  Creatinine down to 1.89. She did have breakfast today and tolerated it.  Objective:  Vital signs in last 24 hours:  Temp:  [97.9 F (36.6 C)-99.3 F (37.4 C)] 99.3 F (37.4 C) (03/04 2992) Pulse Rate:  [62-73] 73 (03/04 0638) Resp:  [20] 20 (03/03 2233) BP: (145-164)/(47-56) 164/56 (03/04 0638) SpO2:  [94 %-98 %] 97 % (03/04 0638) Weight:  [135.4 kg (298 lb 8.1 oz)] 135.4 kg (298 lb 8.1 oz) (03/04 0452)  Weight change: 0.5 kg (1 lb 1.6 oz) Filed Weights   09/12/17 0548 09/13/17 0500 09/14/17 0452  Weight: 133.5 kg (294 lb 5 oz) 134.9 kg (297 lb 6.4 oz) 135.4 kg (298 lb 8.1 oz)    Intake/Output: I/O last 3 completed shifts: In: 2318.3 [I.V.:2146.3; IV Piggyback:172] Out: 450 [Urine:450]   Intake/Output this shift:  No intake/output data recorded.  Physical Exam: General: NAD, laying in bed  Head: Normocephalic, atraumatic. Moist oral mucosal membranes  Eyes: Anicteric  Neck: Supple, trachea midline  Lungs:  Clear to auscultation  Heart: Regular rate and rhythm  Abdomen:  Soft, nontender, obese  Extremities: no peripheral edema.  Neurologic: Nonfocal, moving all four extremities  Skin: No rashes noted       Basic Metabolic Panel: Recent Labs  Lab 09/10/17 2002 09/11/17 0439 09/12/17 0526 09/13/17 0540 09/14/17 0437  NA 142 140 140 140 140  K 4.4 4.9 5.2* 5.0 4.3  CL 108 106 107 107 109  CO2 21* 23 22 21* 21*  GLUCOSE 195* 275* 174* 168* 133*  BUN 47* 47* 62* 69* 66*  CREATININE 2.05* 2.53* 2.89* 2.52* 1.98*  CALCIUM 8.6* 8.2* 7.8* 7.8* 7.7*  PHOS  --   --   --  4.4 3.7    Liver Function Tests: Recent Labs  Lab 09/10/17 2002 09/13/17 0540 09/14/17 0437  AST 23  --   --   ALT 9*  --   --   ALKPHOS 66  --   --   BILITOT 0.6  --   --   PROT 7.7  --   --   ALBUMIN 3.2* 2.2* 1.9*   Recent Labs  Lab 09/10/17 2002  LIPASE 25   No results for input(s): AMMONIA in the last 168  hours.  CBC: Recent Labs  Lab 09/10/17 2132 09/11/17 0439 09/12/17 0526 09/13/17 0540 09/14/17 0438  WBC 15.2* 22.8* 22.7* 20.4* 16.8*  NEUTROABS 13.9*  --   --   --   --   HGB 8.9* 8.0* 7.3* 7.4* 6.7*  HCT 28.8* 26.7* 23.8* 24.4* 22.5*  MCV 76.9* 77.3* 76.9* 77.0* 77.6*  PLT 328 336 313 350 367    Cardiac Enzymes: No results for input(s): CKTOTAL, CKMB, CKMBINDEX, TROPONINI in the last 168 hours.  BNP: Invalid input(s): POCBNP  CBG: Recent Labs  Lab 09/13/17 0745 09/13/17 1139 09/13/17 1641 09/13/17 2229 09/14/17 0743  GLUCAP 149* 139* 105* 130* 135*    Microbiology: Results for orders placed or performed during the hospital encounter of 09/10/17  Urine culture     Status: Abnormal   Collection Time: 09/10/17 10:14 PM  Result Value Ref Range Status   Specimen Description   Final    URINE, RANDOM Performed at [facility], [address], [place], [place] [address]    Special Requests   Final    NONE Performed at [facility], [address] [place],  [address]    Culture >=100,000  COLONIES/mL ESCHERICHIA COLI (A)  Final   Report Status 09/13/2017 FINAL  Final   Organism ID, Bacteria ESCHERICHIA COLI (A)  Final      Susceptibility   Escherichia coli - MIC*    AMPICILLIN >=32 RESISTANT Resistant     CEFAZOLIN 32 INTERMEDIATE Intermediate     CEFTRIAXONE <=1 SENSITIVE Sensitive     CIPROFLOXACIN >=4 RESISTANT Resistant     GENTAMICIN <=1 SENSITIVE Sensitive     IMIPENEM <=0.25 SENSITIVE Sensitive     NITROFURANTOIN <=16 SENSITIVE Sensitive     TRIMETH/SULFA <=20 SENSITIVE Sensitive     AMPICILLIN/SULBACTAM >=32 RESISTANT Resistant     PIP/TAZO <=4 SENSITIVE Sensitive     Extended ESBL NEGATIVE Sensitive     * >=100,000 COLONIES/mL ESCHERICHIA COLI  MRSA PCR Screening     Status: Abnormal   Collection Time: 09/11/17  2:55 AM  Result Value Ref Range Status   MRSA by PCR POSITIVE (A) NEGATIVE Final    Comment:        The  GeneXpert MRSA Assay (FDA approved for NASAL specimens only), is one component of a comprehensive MRSA colonization surveillance program. It is not intended to diagnose MRSA infection nor to guide or monitor treatment for MRSA infections. RESULT CALLED TO, READ BACK BY AND VERIFIED WITH: STEPHEN SYKES AT 0512 ON 09/11/17 [place]. Performed at [facility], [address] [place], [place] [address]   C difficile quick scan w PCR reflex     Status: None   Collection Time: 09/12/17 12:57 AM  Result Value Ref Range Status   C Diff antigen NEGATIVE NEGATIVE Final   C Diff toxin NEGATIVE NEGATIVE Final   C Diff interpretation No C. difficile detected.  Final    Comment: Performed at [facility], [address] [place], [place] [address]    Coagulation Studies: No results for input(s): LABPROT, INR in the last 72 hours.  Urinalysis: No results for input(s): COLORURINE, LABSPEC, PHURINE, GLUCOSEU, HGBUR, BILIRUBINUR, KETONESUR, PROTEINUR, UROBILINOGEN, NITRITE, LEUKOCYTESUR in the last 72 hours.  Invalid input(s): APPERANCEUR    Imaging: No results found.   Medications:   . sodium chloride 75 mL/hr at 09/13/17 2336  . albumin human    . cefTRIAXone (ROCEPHIN)  IV Stopped (09/13/17 1017)   . amLODipine  10 mg Oral Daily  . Chlorhexidine Gluconate Cloth  6 each Topical Q0600  . docusate sodium  100 mg Oral BID  . famotidine  20 mg Oral Daily  . gabapentin  300 mg Oral TID  . heparin  5,000 Units Subcutaneous Q8H  . insulin aspart  0-5 Units Subcutaneous QHS  . insulin aspart  0-9 Units Subcutaneous TID WC  . insulin aspart  3 Units Subcutaneous TID WC  . insulin glargine  25 Units Subcutaneous Q2200  . mouth rinse  15 mL Mouth Rinse BID  . medroxyPROGESTERone  20 mg Oral Daily  . montelukast  10 mg Oral Daily  . mupirocin ointment  1 application Nasal BID  . pravastatin  40 mg Oral Daily   acetaminophen **OR** acetaminophen, bisacodyl,  HYDROcodone-acetaminophen, morphine injection, ondansetron **OR** ondansetron (ZOFRAN) IV, promethazine, traZODone  Assessment/ Plan:  [name] is a 72 y.o. black female with with anemia, congestive heart failure, diabetes mellitus type 2, peripheral edema, GERD, hypertension, hyperlipidemia, peripheral neuropathy, obesity, history of CVA, obstructive sleep apnea  1. Acute renal failure with hyperkalemia on chronic kidney disease stage III with proteinuria: baseline creatinine 1.61, GFR of 37 on 07/29/17  Chronic kidney disease secondary to diabetes and hypertension.  Acute renal failure from prerenal azotemia -Renal function does continue to slowly improve.  Creatinine down to 1.98 but still above baseline.  Continue IV fluid hydration for now.  2. Urinary tract infection: E. Coli on urine culture from 2/28.  Transition to ceftriaxone.  3. Anemia with kidney failure: hemoglobin 7.4. Microcytic with labs from 10/18 consistent with iron deficiency. History of GYN bleeding - being followed by [facility] GYN and is waiting cardiac clearance before surgical intervention.   4. Hypertension: Continue amlodipine 10 mg p.o. daily.  Metoprolol on hold secondary to bradycardia previously.  Hold off on ACE inhibitor or ARB given acute renal failure.    LOS: 3 Kayne Yuhas 3/4/201910:54 AM

## 2017-09-14 NOTE — Care Management (Signed)
Patient admitted from home with abdominal pain.  Patient asleep at this time.  Her son's fiance is at bedside.  She states that patient lives at home with her 2 sons.  They provide support and transportation.  PT has assessed patient recommends SNF.  Son's fiance states that they wish to take patient home.  Previously the have also declined SNF.  Patient has chronic O2 through [facility], cane, BSC, WC, and RW in the home.  Message left with son [name] to discuss discharge disposition.  Previous admission patient has been open with [facility].  The most recent admission Home health services were declined.  Awaiting return call from son.

## 2017-09-14 NOTE — Evaluation (Signed)
Physical Therapy Evaluation Patient Details Name: Veronica Anderson MRN: [id number] DOB: 1946-07-13 Today's Date: 09/14/2017   History of Present Illness  Pt is a 72 y/o F who presented with acute onset of severe epigastric pain, nausea and vomiting.  Pt is positive for UTI, CT of the abdomen shows gastroenteritis changes.  Pt's PMH includes CHF, lumbago, MI, neuropathy, obesity, stroke, L ankle surgery.    Clinical Impression  Pt admitted with above diagnosis. Pt currently with functional limitations due to the deficits listed below (see PT Problem List). Encouraged Ms. Mahar Encouraged to participate in bed mobility and attempt OOB this session but pt refused, telling this PT to return tomorrow.  Despite extensive education on the benefits of mobility and the result of delaying mobility the pt continued to refuse bed mobility this session.  Pt agreeable to allow this PT to instruct pt in bed level exercises for her to complete throughout the day. At baseline pt ambulating up to just 6 ft and requires assist for ADLs.  Will continue to assess follow up recommendations as pt progresses.  Given pt's current mobility status, recommending SNF at d/c.  If pt refuses SNF the she will likely need 24/7 assist/supervision.  Pt will benefit from skilled PT to increase their independence and safety with mobility to allow discharge to the venue listed below.      Follow Up Recommendations SNF    Equipment Recommendations  Other (comment)(TBD as pt progresses)    Recommendations for Other Services       Precautions / Restrictions Precautions Precautions: Fall;Other (comment) Precaution Comments: Chronic 2L O2 Restrictions Weight Bearing Restrictions: No      Mobility  Bed Mobility               General bed mobility comments: Pt refused bed mobility this date  Transfers                    Ambulation/Gait                Stairs            Wheelchair Mobility     Modified Rankin (Stroke Patients Only)       Balance Overall balance assessment: (Unable to assess)                                           Pertinent Vitals/Pain Pain Assessment: No/denies pain    Home Living Family/patient expects to be discharged to:: Private residence Living Arrangements: Children Available Help at Discharge: Family;Available PRN/intermittently(almost 24/7) Type of Home: House Home Access: Stairs to enter Entrance Stairs-Rails: Right;Left;Can reach both Entrance Stairs-Number of Steps: 3 Home Layout: One level Home Equipment: Cane - single point;Bedside commode;Wheelchair - [employer] - 2 wheels      Prior Function Level of Independence: Needs assistance   Gait / Transfers Assistance Needed: Pt ambualtes with SPC up to ~6 ft.  Denies any falls in the past 6 months.  Pt uses WC for farther distances, her son pushes her.   ADL's / Homemaking Assistance Needed: Aide assist with sponge bathing, cleaning, sons do the cooking.    Comments: Aide stays 4 hrs/day, 7 days/wk     Hand Dominance   Dominant Hand: Right    Extremity/Trunk Assessment   Upper Extremity Assessment Upper Extremity Assessment: (BUE strength grossly 3-/5)    Lower Extremity Assessment  Lower Extremity Assessment: (BLE strength grossly 3-/5)       Communication   Communication: No difficulties  Cognition Arousal/Alertness: Awake/alert Behavior During Therapy: WFL for tasks assessed/performed Overall Cognitive Status: Within Functional Limits for tasks assessed                                        General Comments General comments (skin integrity, edema, etc.): Encouraged pt to participate in bed mobility and attempt OOB this session but pt refused, telling this PT to return tomorrow.  Despite extensive education on the benefits of mobility and the result of delaying mobility the pt continued to refuse bed mobility this session.  Pt  agreeable to allow this PT to instruct pt in bed level exercises for her to complete throughout the day.    Exercises General Exercises - Upper Extremity Shoulder Flexion: AROM;Both;10 reps;Supine Elbow Flexion: AROM;10 reps;Supine;Both Elbow Extension: AROM;Both;10 reps;Supine Digit Composite Flexion: AROM;Both;10 reps;Supine Composite Extension: AROM;Both;10 reps;Supine General Exercises - Lower Extremity Ankle Circles/Pumps: AROM;Both;10 reps;Supine Quad Sets: Strengthening;Both;10 reps;Supine   Assessment/Plan    PT Assessment Patient needs continued PT services  PT Problem List Decreased strength;Decreased activity tolerance;Decreased balance;Decreased range of motion;Decreased mobility;Decreased knowledge of use of DME;Decreased safety awareness;Cardiopulmonary status limiting activity;Obesity       PT Treatment Interventions DME instruction;Gait training;Functional mobility training;Therapeutic activities;Therapeutic exercise;Stair training;Balance training;Neuromuscular re-education;Patient/family education;Wheelchair mobility training    PT Goals (Current goals can be found in the Care Plan section)  Acute Rehab PT Goals Patient Stated Goal: to return to PLOF PT Goal Formulation: With patient Time For Goal Achievement: 09/28/17 Potential to Achieve Goals: Fair    Frequency Min 2X/week   Barriers to discharge Decreased caregiver support Pt does not have complete 24/7 assist/superivision available    Co-evaluation               AM-PAC PT "6 Clicks" Daily Activity  Outcome Measure Difficulty turning over in bed (including adjusting bedclothes, sheets and blankets)?: Unable Difficulty moving from lying on back to sitting on the side of the bed? : Unable Difficulty sitting down on and standing up from a chair with arms (e.g., wheelchair, bedside commode, etc,.)?: Unable Help needed moving to and from a bed to chair (including a wheelchair)?: Total Help needed  walking in hospital room?: Total Help needed climbing 3-5 steps with a railing? : Total 6 Click Score: 6    End of Session Equipment Utilized During Treatment: Oxygen Activity Tolerance: Patient limited by fatigue;Other (comment)(pt refused to attempt bed mobility) Patient left: in bed;with call bell/phone within reach;with bed alarm set;with family/visitor present Nurse Communication: Mobility status PT Visit Diagnosis: Unsteadiness on feet (R26.81);Other abnormalities of gait and mobility (R26.89);Difficulty in walking, not elsewhere classified (R26.2);Muscle weakness (generalized) (M62.81)    Time: 4818-5631 PT Time Calculation (min) (ACUTE ONLY): 17 min   Charges:   PT Evaluation $PT Eval Low Complexity: 1 Low     PT G Codes:        Collie Siad PT, DPT 09/14/2017, 11:55 AM

## 2017-09-15 LAB — TYPE AND SCREEN
ABO/RH(D): B POS
Antibody Screen: NEGATIVE
UNIT DIVISION: 0

## 2017-09-15 LAB — BPAM RBC
BLOOD PRODUCT EXPIRATION DATE: 201904022359
ISSUE DATE / TIME: 201903041811
Unit Type and Rh: 7300

## 2017-09-15 LAB — GLUCOSE, CAPILLARY
GLUCOSE-CAPILLARY: 171 mg/dL — AB (ref 65–99)
Glucose-Capillary: 169 mg/dL — ABNORMAL HIGH (ref 65–99)

## 2017-09-15 MED ORDER — FERROUS SULFATE 325 (65 FE) MG PO TABS: 325.0000 mg | ORAL_TABLET | Freq: Two times a day (BID) | ORAL | 3 refills | Status: DC

## 2017-09-15 MED ORDER — CEFPODOXIME PROXETIL 200 MG PO TABS: 200.0000 mg | ORAL_TABLET | Freq: Two times a day (BID) | ORAL | 0 refills | Status: AC

## 2017-09-15 MED ORDER — SENNOSIDES-DOCUSATE SODIUM 8.6-50 MG PO TABS: 2.0000 | ORAL_TABLET | Freq: Every day | ORAL | 1 refills | Status: AC | PRN

## 2017-09-15 MED ORDER — CEPHALEXIN 500 MG PO CAPS: 500.0000 mg | ORAL_CAPSULE | Freq: Three times a day (TID) | ORAL | 0 refills | Status: DC

## 2017-09-15 NOTE — Care Management (Signed)
Plan for patient to discharge home today.  RNCM has not received return call from son [name].  Patient requests for RNCM to speak to sons prior to making decision about home health.  RNCM spoke with Illa Level.  He wishes to speak to his brother prior to making a decision, and will call me back.  Family to transport at discharge, and bring portable tank.

## 2017-09-15 NOTE — Discharge Summary (Addendum)
[facility] [place] NAME: Veronica Anderson    MR#:  [id number]  DATE OF BIRTH:  September 16, 1945  DATE OF ADMISSION:  09/10/2017   ADMITTING PHYSICIAN: Amelia Jo, MD  DATE OF DISCHARGE: 09/15/17  PRIMARY CARE PHYSICIAN: Steele Sizer, MD   ADMISSION DIAGNOSIS:   Epigastric pain [R10.13] Acute cystitis with hematuria [N30.01] Nausea and vomiting, intractability of vomiting not specified, unspecified vomiting type [R11.2]  DISCHARGE DIAGNOSIS:   Active Problems:   UTI (urinary tract infection)   Pressure injury of skin   SECONDARY DIAGNOSIS:   Past Medical History:  Diagnosis Date  . Allergy   . Anemia   . Asthma   . Cataract    bilateral  . CHF (NYHA class II, ACC/AHA stage C) ([facility])   . Chronic kidney disease    stage IV (severe), Dr. Aleene Davidson  . Constipation   . Diabetes mellitus without complication ([place])   . Edema    feet/ankles  . GERD (gastroesophageal reflux disease)   . Hyperlipidemia   . Hypertension   . Left ankle pain   . Lumbago   . Myocardial infarction ([place])   . Neuropathy   . Neuropathy   . OA (osteoarthritis)   . Obesity   . Obstructive sleep apnea syndrome    CPAP  . Paresthesia    Foot  . Protein calorie malnutrition ([place])   . Requires supplemental oxygen   . Stroke ([place])   . Vitamin D deficiency     HOSPITAL COURSE:   72 year old female with past medical history significant for CK D stage III, type 2 diabetes mellitus, CAD, COPD, congestive heart failure, recent vaginal bleeding following up at [facility] presents to hospital secondary to abdominal pain, nausea and vomiting.  1. Abdominal pain with nausea and vomiting-CT of the abdomen showing possible changes of small bowel likely consistent with enteritis/gastroenteritis. -Much improved symptoms. Tolerating regular diet. - Did not receive antibiotics for enteritis in the hospital.  2. Acute cystitis-urine cultures are growing Escherichia coli  sensitive to Rocephin. -Received Rocephin in the hospital. WBC was significantly high on presentation. Responding well, currently at 12. Will be discharged on Keflex. Blood cultures were not done on admission.  3. Acute renal failure on CKD- Baseline creatinine seems to be around 1.6. Cr down to 1.6 today -Held nephrotoxins. Patient was on Lasix at home-it was held while in the hospital. Now the patient is on a regular diet. Dehydration appears to be resolved. Restart Lasix at discharge. -Renal ultrasound is normal.  -Appreciate nephrology consult  4. COPD and obstructive sleep apnea-continue CPAP at bedtime. -Chronically on 2 L oxygen  5. Hypertension-continue Norvasc and clonidine at this time. -Hold off her oral hydralazine and metoprolol at the time of discharge.  6. Vaginal bleeding-  started on Provera by [facility] GYN recently. Has a follow-up appointment for possible D&C and biopsy Likely might need hysterectomy -Outpatient follow-up as prior scheduled  7. Diabetes mellitus-continue Lantus .  8. Acute on chronic anemia-known history of anemia of chronic disease requiring transfusions. Also dealing with vaginal bleeding recently.  - Hemoglobin has dropped to 6.7. received 1 unit packed RBC transfusion while in the hospital. Hemoglobin is stable today at 8.1. -Started on oral iron supplements.    Patient lives at home with family. She takes very minimal steps with a walker. Her Sons help her at home Daughter-in-law updated at bedside Will be discharged with home health    DISCHARGE CONDITIONS:  Guarded  CONSULTS OBTAINED:   Treatment Team:  Lavonia Dana, MD  DRUG ALLERGIES:   Allergies  Allergen Reactions  . Ace Inhibitors Cough   DISCHARGE MEDICATIONS:   Allergies as of 09/15/2017      Reactions   Ace Inhibitors Cough      Medication List    STOP taking these medications   hydrALAZINE 50 MG tablet Commonly known as:  APRESOLINE   loratadine 10 MG  tablet Commonly known as:  CLARITIN   metoprolol tartrate 100 MG tablet Commonly known as:  LOPRESSOR     TAKE these medications   ACCU-CHEK AVIVA PLUS test strip Generic drug:  glucose blood CHECK BLOOD SUGAR TWICE DAILY   glucose blood test strip Commonly known as:  ACCU-CHEK AVIVA PLUS 1 each by Other route 2 (two) times daily. Use as instructed   amLODipine 10 MG tablet Commonly known as:  NORVASC Take 1 tablet (10 mg total) by mouth daily.   aspirin EC 81 MG tablet Take 81 mg by mouth at bedtime.   cefpodoxime 200 MG tablet Commonly known as:  VANTIN Take 1 tablet (200 mg total) by mouth 2 (two) times daily for 4 days.   cloNIDine 0.3 mg/24hr patch Commonly known as:  CATAPRES - Dosed in mg/24 hr Place 1 patch (0.3 mg total) onto the skin once a week.   desonide 0.05 % ointment Commonly known as:  DESOWEN Apply 1 application topically 2 (two) times daily.   ferrous sulfate 325 (65 FE) MG tablet Take 1 tablet (325 mg total) by mouth 2 (two) times daily with a meal.   furosemide 40 MG tablet Commonly known as:  LASIX Take 1 tablet (40 mg total) by mouth daily.   gabapentin 300 MG capsule Commonly known as:  NEURONTIN TAKE 1 CAPSULE (300 MG TOTAL) BY MOUTH 3 (THREE) TIMES DAILY.   Insulin Glargine 100 UNIT/ML Solostar Pen Commonly known as:  LANTUS SOLOSTAR INJECT 35 UNITS SUBCUTANEOUSLY DAILY   insulin lispro 100 UNIT/ML KiwkPen Commonly known as:  HUMALOG KWIKPEN INJECT 5 TO 10 UNITS SUBCUTANEOUSLY BEFORE MEALS What changed:    how much to take  how to take this  when to take this  additional instructions   medroxyPROGESTERone 10 MG tablet Commonly known as:  PROVERA Take 20 mg by mouth daily.   montelukast 10 MG tablet Commonly known as:  SINGULAIR Take 1 tablet (10 mg total) daily by mouth.   NOVOFINE 32G X 6 MM Misc Generic drug:  Insulin Pen Needle USE 4 TIMES A DAY AS DIRECTED   nystatin powder Commonly known as:  nystatin Apply 1  g topically 3 (three) times daily.   pravastatin 40 MG tablet Commonly known as:  PRAVACHOL TAKE 1 TABLET BY MOUTH EVERY DAY   ranitidine 150 MG tablet Commonly known as:  ZANTAC Take 1 tablet (150 mg total) by mouth 2 (two) times daily. Trying to wean off Omeprazole   senna-docusate 8.6-50 MG tablet Commonly known as:  Senokot-S Take 2 tablets by mouth daily as needed for mild constipation.   Vitamin D (Ergocalciferol) 50000 units Caps capsule Commonly known as:  DRISDOL TAKE ONE CAPSULE BY MOUTH ONCE A WEEK What changed:    how much to take  how to take this  when to take this        DISCHARGE INSTRUCTIONS:   1. PCP f/u in 1-2 weeks 2. ObGyn f/u as prior  scheduled  DIET:   Cardiac diet  ACTIVITY:  Activity as tolerated  OXYGEN:   Home Oxygen: Yes.    Oxygen Delivery: 2 liters/min via Patient connected to nasal cannula oxygen  DISCHARGE LOCATION:   home   If you experience worsening of your admission symptoms, develop shortness of breath, life threatening emergency, suicidal or homicidal thoughts you must seek medical attention immediately by calling 911 or calling your MD immediately  if symptoms less severe.  You Must read complete instructions/literature along with all the possible adverse reactions/side effects for all the Medicines you take and that have been prescribed to you. Take any new Medicines after you have completely understood and accpet all the possible adverse reactions/side effects.   Please note  You were cared for by a hospitalist during your hospital stay. If you have any questions about your discharge medications or the care you received while you were in the hospital after you are discharged, you can call the unit and asked to speak with the hospitalist on call if the hospitalist that took care of you is not available. Once you are discharged, your primary care physician will handle any further medical issues. Please note that NO  REFILLS for any discharge medications will be authorized once you are discharged, as it is imperative that you return to your primary care physician (or establish a relationship with a primary care physician if you do not have one) for your aftercare needs so that they can reassess your need for medications and monitor your lab values.    On the day of Discharge:  VITAL SIGNS:   Blood pressure (!) 151/48, pulse 75, temperature 98.7 F (37.1 C), temperature source Axillary, resp. rate 19, height 5\' 4"  (1.626 m), weight (!) 137.9 kg (304 lb 0.2 oz), SpO2 100 %.  PHYSICAL EXAMINATION:    GENERAL:  72 y.o.-year-old obese patient lying in the bed with no acute distress.  EYES: Pupils equal, round, reactive to light and accommodation. No scleral icterus. Extraocular muscles intact.  HEENT: Head atraumatic, normocephalic. Oropharynx and nasopharynx clear.  NECK:  Supple, no jugular venous distention. No thyroid enlargement, no tenderness.  LUNGS: Normal breath sounds bilaterally, no wheezing, rales,rhonchi or crepitation. No use of accessory muscles of respiration. Decreased bibasilar breath sounds CARDIOVASCULAR: S1, S2 normal. No   rubs, or gallops. 2/6 systolic murmur present ABDOMEN: Soft, improved tenderness in the lower quadrant, nondistended. Bowel sounds present. No organomegaly or mass.  EXTREMITIES: No pedal edema, cyanosis, or clubbing.  NEUROLOGIC: Cranial nerves II through XII are intact. Muscle strength 5/5 in all extremities. Sensation intact. Gait not checked.  PSYCHIATRIC: The patient is alert and oriented x 3.  SKIN: No obvious rash, lesion, or ulcer.    DATA REVIEW:   CBC Recent Labs  Lab 09/14/17 2318  WBC 12.9*  HGB 8.1*  HCT 25.8*  PLT 354    Chemistries  Recent Labs  Lab 09/10/17 2002  09/14/17 2318  NA 142   < > 136  K 4.4   < > 4.1  CL 108   < > 107  CO2 21*   < > 21*  GLUCOSE 195*   < > 212*  BUN 47*   < > 61*  CREATININE 2.05*   < > 1.64*  CALCIUM  8.6*   < > 7.7*  AST 23  --   --   ALT 9*  --   --   ALKPHOS 66  --   --   BILITOT 0.6  --   --    < > =  values in this interval not displayed.     Microbiology Results  Results for orders placed or performed during the hospital encounter of 09/10/17  Urine culture     Status: Abnormal   Collection Time: 09/10/17 10:14 PM  Result Value Ref Range Status   Specimen Description   Final    URINE, RANDOM Performed at [facility], [address] [place], [place] [address]    Special Requests   Final    NONE Performed at [facility], [address] [place], [place] [address]    Culture >=100,000 COLONIES/mL ESCHERICHIA COLI (A)  Final   Report Status 09/13/2017 FINAL  Final   Organism ID, Bacteria ESCHERICHIA COLI (A)  Final      Susceptibility   Escherichia coli - MIC*    AMPICILLIN >=32 RESISTANT Resistant     CEFAZOLIN 32 INTERMEDIATE Intermediate     CEFTRIAXONE <=1 SENSITIVE Sensitive     CIPROFLOXACIN >=4 RESISTANT Resistant     GENTAMICIN <=1 SENSITIVE Sensitive     IMIPENEM <=0.25 SENSITIVE Sensitive     NITROFURANTOIN <=16 SENSITIVE Sensitive     TRIMETH/SULFA <=20 SENSITIVE Sensitive     AMPICILLIN/SULBACTAM >=32 RESISTANT Resistant     PIP/TAZO <=4 SENSITIVE Sensitive     Extended ESBL NEGATIVE Sensitive     * >=100,000 COLONIES/mL ESCHERICHIA COLI  MRSA PCR Screening     Status: Abnormal   Collection Time: 09/11/17  2:55 AM  Result Value Ref Range Status   MRSA by PCR POSITIVE (A) NEGATIVE Final    Comment:        The GeneXpert MRSA Assay (FDA approved for NASAL specimens only), is one component of a comprehensive MRSA colonization surveillance program. It is not intended to diagnose MRSA infection nor to guide or monitor treatment for MRSA infections. RESULT CALLED TO, READ BACK BY AND VERIFIED WITH: STEPHEN SYKES AT 0512 ON 09/11/17 [place]. Performed at [facility], [address] [place], [place] [address]   C difficile  quick scan w PCR reflex     Status: None   Collection Time: 09/12/17 12:57 AM  Result Value Ref Range Status   C Diff antigen NEGATIVE NEGATIVE Final   C Diff toxin NEGATIVE NEGATIVE Final   C Diff interpretation No C. difficile detected.  Final    Comment: Performed at [facility], [address] [place], [place] [address]    RADIOLOGY:  No results found.   Management plans discussed with the patient, family and they are in agreement.  CODE STATUS:     Code Status Orders  (From admission, onward)        Start     Ordered   09/11/17 0156  Full code  Continuous     09/11/17 0155    Code Status History    Date Active Date Inactive Code Status Order ID Comments User Context   04/19/2017 08:13 05/05/2017 16:57 Full Code [id number]  Hillary Bow, MD ED   11/07/2016 09:02 11/10/2016 21:16 Full Code [id number]  Hillary Bow, MD ED   03/27/2015 19:41 03/31/2015 15:34 Full Code [id number]  Dustin Flock, MD ED      TOTAL TIME TAKING CARE OF THIS PATIENT: 38 minutes.    Gladstone Lighter M.D on 09/15/2017 at 10:31 AM  Between 7am to 6pm - Pager - [phone]  After 6pm go to [email or web address] - Technical [employer]wer [facility]  Office  [phone]  CC: Primary care physician; Steele Sizer, MD  Note: This dictation was prepared with Dragon dictation along with smaller phrase technology. Any transcriptional errors that result from this process are unintentional.

## 2017-09-15 NOTE — Progress Notes (Signed)
Patient has completed one unit of PRBC and hemoglobin now 8.1. Notified Dr. Marcille Blanco that there is an order for 2 units to be transfused. He has ordered to hold the second transfusion at this time. Will continue to monitor patient.

## 2017-09-15 NOTE — Progress Notes (Signed)
[facility]  ROUNDING NOTE   Subjective:  Creatinine continues to trend down and is currently 1.6. Patient in good spirits.   Objective:  Vital signs in last 24 hours:  Temp:  [98.1 F (36.7 C)-100 F (37.8 C)] 98.1 F (36.7 C) (03/05 1219) Pulse Rate:  [73-77] 75 (03/05 1219) Resp:  [16-20] 18 (03/05 1219) BP: (144-152)/(43-59) 149/45 (03/05 1219) SpO2:  [96 %-100 %] 100 % (03/05 1219) Weight:  [137.9 kg (304 lb 0.2 oz)] 137.9 kg (304 lb 0.2 oz) (03/05 0536)  Weight change: 2.5 kg (5 lb 8.2 oz) Filed Weights   09/13/17 0500 09/14/17 0452 09/15/17 0536  Weight: 134.9 kg (297 lb 6.4 oz) 135.4 kg (298 lb 8.1 oz) (!) 137.9 kg (304 lb 0.2 oz)    Intake/Output: I/O last 3 completed shifts: In: 2738.5 [I.V.:2538.5; IV Piggyback:200] Out: 900 [Urine:900]   Intake/Output this shift:  No intake/output data recorded.  Physical Exam: General: NAD, laying in bed  Head: Normocephalic, atraumatic. Moist oral mucosal membranes  Eyes: Anicteric  Neck: Supple, trachea midline  Lungs:  Clear to auscultation  Heart: Regular rate and rhythm  Abdomen:  Soft, nontender, obese  Extremities: no peripheral edema.  Neurologic: Nonfocal, moving all four extremities  Skin: No rashes noted       Basic Metabolic Panel: Recent Labs  Lab 09/11/17 0439 09/12/17 0526 09/13/17 0540 09/14/17 0437 09/14/17 2318  NA 140 140 140 140 136  K 4.9 5.2* 5.0 4.3 4.1  CL 106 107 107 109 107  CO2 23 22 21* 21* 21*  GLUCOSE 275* 174* 168* 133* 212*  BUN 47* 62* 69* 66* 61*  CREATININE 2.53* 2.89* 2.52* 1.98* 1.64*  CALCIUM 8.2* 7.8* 7.8* 7.7* 7.7*  PHOS  --   --  4.4 3.7  --     Liver Function Tests: Recent Labs  Lab 09/10/17 2002 09/13/17 0540 09/14/17 0437  AST 23  --   --   ALT 9*  --   --   ALKPHOS 66  --   --   BILITOT 0.6  --   --   PROT 7.7  --   --   ALBUMIN 3.2* 2.2* 1.9*   Recent Labs  Lab 09/10/17 2002  LIPASE 25   No results for input(s): AMMONIA in the  last 168 hours.  CBC: Recent Labs  Lab 09/10/17 2132 09/11/17 0439 09/12/17 0526 09/13/17 0540 09/14/17 0438 09/14/17 2318  WBC 15.2* 22.8* 22.7* 20.4* 16.8* 12.9*  NEUTROABS 13.9*  --   --   --   --   --   HGB 8.9* 8.0* 7.3* 7.4* 6.7* 8.1*  HCT 28.8* 26.7* 23.8* 24.4* 22.5* 25.8*  MCV 76.9* 77.3* 76.9* 77.0* 77.6* 77.5*  PLT 328 336 313 350 367 354    Cardiac Enzymes: No results for input(s): CKTOTAL, CKMB, CKMBINDEX, TROPONINI in the last 168 hours.  BNP: Invalid input(s): POCBNP  CBG: Recent Labs  Lab 09/14/17 1152 09/14/17 1644 09/14/17 2122 09/15/17 0751 09/15/17 1216  GLUCAP 179* 196* 187* 171* 169*    Microbiology: Results for orders placed or performed during the hospital encounter of 09/10/17  Urine culture     Status: Abnormal   Collection Time: 09/10/17 10:14 PM  Result Value Ref Range Status   Specimen Description   Final    URINE, RANDOM Performed at [facility], [address], [place], [place] [address]    Special Requests   Final    NONE Performed at [facility], [address] [place], [place] [address]    Culture >=100,000 COLONIES/mL ESCHERICHIA COLI (A)  Final   Report Status 09/13/2017 FINAL  Final   Organism ID, Bacteria ESCHERICHIA COLI (A)  Final      Susceptibility   Escherichia coli - MIC*    AMPICILLIN >=32 RESISTANT Resistant     CEFAZOLIN 32 INTERMEDIATE Intermediate     CEFTRIAXONE <=1 SENSITIVE Sensitive     CIPROFLOXACIN >=4 RESISTANT Resistant     GENTAMICIN <=1 SENSITIVE Sensitive     IMIPENEM <=0.25 SENSITIVE Sensitive     NITROFURANTOIN <=16 SENSITIVE Sensitive     TRIMETH/SULFA <=20 SENSITIVE Sensitive     AMPICILLIN/SULBACTAM >=32 RESISTANT Resistant     PIP/TAZO <=4 SENSITIVE Sensitive     Extended ESBL NEGATIVE Sensitive     * >=100,000 COLONIES/mL ESCHERICHIA COLI  MRSA PCR Screening     Status: Abnormal   Collection Time: 09/11/17  2:55 AM  Result Value Ref Range Status   MRSA by PCR  POSITIVE (A) NEGATIVE Final    Comment:        The GeneXpert MRSA Assay (FDA approved for NASAL specimens only), is one component of a comprehensive MRSA colonization surveillance program. It is not intended to diagnose MRSA infection nor to guide or monitor treatment for MRSA infections. RESULT CALLED TO, READ BACK BY AND VERIFIED WITH: STEPHEN SYKES AT 0512 ON 09/11/17 [place]. Performed at [facility], [address] [place], [place] [address]   C difficile quick scan w PCR reflex     Status: None   Collection Time: 09/12/17 12:57 AM  Result Value Ref Range Status   C Diff antigen NEGATIVE NEGATIVE Final   C Diff toxin NEGATIVE NEGATIVE Final   C Diff interpretation No C. difficile detected.  Final    Comment: Performed at [facility], [address] [place], [place] [address]    Coagulation Studies: No results for input(s): LABPROT, INR in the last 72 hours.  Urinalysis: No results for input(s): COLORURINE, LABSPEC, PHURINE, GLUCOSEU, HGBUR, BILIRUBINUR, KETONESUR, PROTEINUR, UROBILINOGEN, NITRITE, LEUKOCYTESUR in the last 72 hours.  Invalid input(s): APPERANCEUR    Imaging: No results found.   Medications:   . albumin human    . cefTRIAXone (ROCEPHIN)  IV Stopped (09/15/17 1038)   . amLODipine  10 mg Oral Daily  . Chlorhexidine Gluconate Cloth  6 each Topical Q0600  . docusate sodium  100 mg Oral BID  . famotidine  20 mg Oral Daily  . ferrous sulfate  325 mg Oral BID WC  . gabapentin  300 mg Oral TID  . heparin  5,000 Units Subcutaneous Q8H  . insulin aspart  0-5 Units Subcutaneous QHS  . insulin aspart  0-9 Units Subcutaneous TID WC  . insulin aspart  3 Units Subcutaneous TID WC  . insulin glargine  25 Units Subcutaneous Q2200  . mouth rinse  15 mL Mouth Rinse BID  . medroxyPROGESTERone  20 mg Oral Daily  . montelukast  10 mg Oral Daily  . mupirocin ointment  1 application Nasal BID  . pravastatin  40 mg Oral Daily   acetaminophen  **OR** acetaminophen, bisacodyl, HYDROcodone-acetaminophen, morphine injection, ondansetron **OR** ondansetron (ZOFRAN) IV, promethazine, traZODone  Assessment/ Plan:  [name] is a 72 y.o. black female with with anemia, congestive heart failure, diabetes mellitus type 2, peripheral edema, GERD, hypertension, hyperlipidemia, peripheral neuropathy, obesity, history of CVA, obstructive sleep apnea  1. Acute renal failure with hyperkalemia on chronic  kidney disease stage III with proteinuria: baseline creatinine 1.61, GFR of 37 on 07/29/17 Chronic kidney disease secondary to diabetes and hypertension.  Acute renal failure from prerenal azotemia -renal function continues to improve.  Creatinine down to 1.6 which is essentially her baseline.  She will need outpatient followup for her chronic kidney disease as well as acute renal failure.  2. Urinary tract infection: E. Coli on urine culture from 2/28. atient treated with ceftriaxone.  3. Anemia with kidney failure: hemoglobin up to 8.1 now. Has element of iron deficiency. History of GYN bleeding - being followed by [facility] and is waiting cardiac clearance before surgical intervention.   4. Hypertension: maintain the patient on amlodipine 10 mg by mouth daily.  Hold off on adding ACE inhibitor or ARB at the moment.    LOS: 4 Stormy Connon 3/5/201912:25 PM

## 2017-09-15 NOTE — Care Management Important Message (Signed)
Important Message  Patient Details  Name: Veronica Anderson MRN: [id number] Date of Birth: 08/16/1945   Medicare Important Message Given:  Yes    Beverly Sessions, RN 09/15/2017, 11:12 AM

## 2017-09-15 NOTE — Progress Notes (Signed)
Veronica Anderson to be D/C'd Home per MD order.  Discussed prescriptions and follow up appointments with the patient. Prescriptions given to patient, medication list explained in detail. Pt verbalized understanding.  Allergies as of 09/15/2017      Reactions   Ace Inhibitors Cough      Medication List    STOP taking these medications   hydrALAZINE 50 MG tablet Commonly known as:  APRESOLINE   loratadine 10 MG tablet Commonly known as:  CLARITIN   metoprolol tartrate 100 MG tablet Commonly known as:  LOPRESSOR     TAKE these medications   ACCU-CHEK AVIVA PLUS test strip Generic drug:  glucose blood CHECK BLOOD SUGAR TWICE DAILY   glucose blood test strip Commonly known as:  ACCU-CHEK AVIVA PLUS 1 each by Other route 2 (two) times daily. Use as instructed   amLODipine 10 MG tablet Commonly known as:  NORVASC Take 1 tablet (10 mg total) by mouth daily.   aspirin EC 81 MG tablet Take 81 mg by mouth at bedtime.   cefpodoxime 200 MG tablet Commonly known as:  VANTIN Take 1 tablet (200 mg total) by mouth 2 (two) times daily for 4 days.   cloNIDine 0.3 mg/24hr patch Commonly known as:  CATAPRES - Dosed in mg/24 hr Place 1 patch (0.3 mg total) onto the skin once a week.   desonide 0.05 % ointment Commonly known as:  DESOWEN Apply 1 application topically 2 (two) times daily.   ferrous sulfate 325 (65 FE) MG tablet Take 1 tablet (325 mg total) by mouth 2 (two) times daily with a meal.   furosemide 40 MG tablet Commonly known as:  LASIX Take 1 tablet (40 mg total) by mouth daily.   gabapentin 300 MG capsule Commonly known as:  NEURONTIN TAKE 1 CAPSULE (300 MG TOTAL) BY MOUTH 3 (THREE) TIMES DAILY.   Insulin Glargine 100 UNIT/ML Solostar Pen Commonly known as:  LANTUS SOLOSTAR INJECT 35 UNITS SUBCUTANEOUSLY DAILY   insulin lispro 100 UNIT/ML KiwkPen Commonly known as:  HUMALOG KWIKPEN INJECT 5 TO 10 UNITS SUBCUTANEOUSLY BEFORE MEALS What changed:    how much to  take  how to take this  when to take this  additional instructions   medroxyPROGESTERone 10 MG tablet Commonly known as:  PROVERA Take 20 mg by mouth daily.   montelukast 10 MG tablet Commonly known as:  SINGULAIR Take 1 tablet (10 mg total) daily by mouth.   NOVOFINE 32G X 6 MM Misc Generic drug:  Insulin Pen Needle USE 4 TIMES A DAY AS DIRECTED   nystatin powder Commonly known as:  nystatin Apply 1 g topically 3 (three) times daily.   pravastatin 40 MG tablet Commonly known as:  PRAVACHOL TAKE 1 TABLET BY MOUTH EVERY DAY   ranitidine 150 MG tablet Commonly known as:  ZANTAC Take 1 tablet (150 mg total) by mouth 2 (two) times daily. Trying to wean off Omeprazole   senna-docusate 8.6-50 MG tablet Commonly known as:  Senokot-S Take 2 tablets by mouth daily as needed for mild constipation.   Vitamin D (Ergocalciferol) 50000 units Caps capsule Commonly known as:  DRISDOL TAKE ONE CAPSULE BY MOUTH ONCE A WEEK What changed:    how much to take  how to take this  when to take this       Vitals:   09/15/17 0329 09/15/17 1219  BP: (!) 151/48 (!) 149/45  Pulse: 75 75  Resp: 19 18  Temp: 98.7 F (37.1 C) 98.1 F (  36.7 C)  SpO2: 100% 100%    Skin clean, dry and intact without evidence of skin break down, no evidence of skin tears noted. IV catheter discontinued intact. Site without signs and symptoms of complications. Dressing and pressure applied. Pt denies pain at this time. No complaints noted.  An After Visit Summary was printed and given to the patient. Patient transferred via EMS home  Sharalyn Ink

## 2017-09-15 NOTE — Care Management Note (Signed)
Case Management Note  Patient Details  Name: Susi Goslin MRN: [id number] Date of Birth: [birth year]    Return call received from son [name].  [facility] was selected for home health.  Referral made to [facility] A[facility] with [facility].  RNCM signing off.   Subjective/Objective:                    Action/Plan:   Expected Discharge Date:  09/15/17               Expected Discharge Plan:  [place]  In-House Referral:     Discharge planning Services  CM Consult  Post Acute Care Choice:  Home Health Choice offered to:  Adult Children  DME Arranged:    DME Agency:     HH Arranged:  RN, PT, Nurse's Aide [place] Agency:  [facility] (now [facility])  Status of Service:  Completed, signed off  If discussed at [place] of Stay Meetings, dates discussed:    Additional Comments:  Beverly Sessions, RN 09/15/2017, 2:24 PM

## 2017-09-16 ENCOUNTER — Inpatient Hospital Stay: Payer: Medicare Other | Admitting: Family Medicine

## 2017-09-16 ENCOUNTER — Telehealth: Payer: Self-pay

## 2017-09-16 NOTE — Telephone Encounter (Signed)
TOC #1. Called pt to f/u after d/c from [facility] on 09/15/17. Also wanted to reschedule their missed hosp f/u appt w/ Dr. Ancil Boozer on 09/16/17 @ 10:40am. Discharge planning includes the following:  - Start Vantin, Senokot and iron - Stop taking hydralazine, Metoprolol and Claritin - Continue CPAP, clonidine and Norvasc - d/c home with [facility] - F/u w/ Dr. Ancil Boozer on 09/16/17 @ 10:40am NOTE: this will need rescheduled due to NS for this appt. - F/u w/ Dr. Candiss Norse on 09/24/17 @ 10:30am As part of the [facility] f/u call, I am also wanting to discuss/review the above information with the pt to ensure all of the above have been taken care of. No answer on home # and cell # is busy.

## 2017-09-16 NOTE — Telephone Encounter (Signed)
Missed appointment today

## 2017-09-17 ENCOUNTER — Telehealth: Payer: Self-pay

## 2017-09-17 DIAGNOSIS — N184 Chronic kidney disease, stage 4 (severe): Secondary | ICD-10-CM | POA: Diagnosis not present

## 2017-09-17 DIAGNOSIS — E559 Vitamin D deficiency, unspecified: Secondary | ICD-10-CM | POA: Diagnosis not present

## 2017-09-17 DIAGNOSIS — E1142 Type 2 diabetes mellitus with diabetic polyneuropathy: Secondary | ICD-10-CM | POA: Diagnosis not present

## 2017-09-17 DIAGNOSIS — E46 Unspecified protein-calorie malnutrition: Secondary | ICD-10-CM | POA: Diagnosis not present

## 2017-09-17 DIAGNOSIS — I2510 Atherosclerotic heart disease of native coronary artery without angina pectoris: Secondary | ICD-10-CM | POA: Diagnosis not present

## 2017-09-17 DIAGNOSIS — E1122 Type 2 diabetes mellitus with diabetic chronic kidney disease: Secondary | ICD-10-CM | POA: Diagnosis not present

## 2017-09-17 DIAGNOSIS — I130 Hypertensive heart and chronic kidney disease with heart failure and stage 1 through stage 4 chronic kidney disease, or unspecified chronic kidney disease: Secondary | ICD-10-CM | POA: Diagnosis not present

## 2017-09-17 DIAGNOSIS — J449 Chronic obstructive pulmonary disease, unspecified: Secondary | ICD-10-CM | POA: Diagnosis not present

## 2017-09-17 DIAGNOSIS — D631 Anemia in chronic kidney disease: Secondary | ICD-10-CM | POA: Diagnosis not present

## 2017-09-17 DIAGNOSIS — G4733 Obstructive sleep apnea (adult) (pediatric): Secondary | ICD-10-CM | POA: Diagnosis not present

## 2017-09-17 DIAGNOSIS — E1136 Type 2 diabetes mellitus with diabetic cataract: Secondary | ICD-10-CM | POA: Diagnosis not present

## 2017-09-17 DIAGNOSIS — M1991 Primary osteoarthritis, unspecified site: Secondary | ICD-10-CM | POA: Diagnosis not present

## 2017-09-17 DIAGNOSIS — I509 Heart failure, unspecified: Secondary | ICD-10-CM | POA: Diagnosis not present

## 2017-09-17 NOTE — Telephone Encounter (Signed)
Please see if we can reschedule this patient a another appt. Thanks

## 2017-09-17 NOTE — Telephone Encounter (Signed)
NOTE: + for MRSA  TOC #2. Called pt to f/u after d/c from [facility] on 09/15/17. Also wanted to reschedule her missed hosp f/u appt w/ Dr. Ancil Boozer on 09/16/17 @ 10:40am. Discharge planning includes the following:  - Start Vantin, Senokot and iron - Stop taking hydralazine, Metoprolol and Claritin - Continue CPAP, clonidine and Norvasc - d/c home with [facility] - F/u w/ Dr. Ancil Boozer NOTE: Hosp f/u appt will need rescheduled due to NS on 09/18/17. - F/u w/ Dr. Candiss Norse on 09/24/17 @ 10:30am As part of the [facility] f/u call, I am also wanting to discuss/review the above information with the pt to ensure all of the above have been taken care of. No answer on home # and cell # is busy. Also called emergency contact, Otis Dials and LVM requesting returned call.

## 2017-09-17 NOTE — Telephone Encounter (Signed)
Pt has already rescheduled for 09-28-17 with dr Ancil Boozer

## 2017-09-18 ENCOUNTER — Other Ambulatory Visit: Payer: Self-pay

## 2017-09-18 DIAGNOSIS — Z794 Long term (current) use of insulin: Principal | ICD-10-CM

## 2017-09-18 DIAGNOSIS — E1140 Type 2 diabetes mellitus with diabetic neuropathy, unspecified: Secondary | ICD-10-CM

## 2017-09-18 NOTE — Telephone Encounter (Signed)
Refill request for general medication: Gabapentin 300 mg  Last office visit: 07/29/2017  Last physical exam: None indicated  Follow-up on file. 09/28/2017

## 2017-09-18 NOTE — Telephone Encounter (Signed)
Do we have any openings as suggested by Aimee?

## 2017-09-18 NOTE — Telephone Encounter (Signed)
Called and spoke with patient at 3:05pm informed her that we need to schedule her an appointment with sowles or emily sooner than the appt that is already scheduled (09-28-17). Pt stated that she will have to speak with her son that she will give [place] a call back.

## 2017-09-23 ENCOUNTER — Other Ambulatory Visit: Payer: Self-pay

## 2017-09-23 DIAGNOSIS — E785 Hyperlipidemia, unspecified: Secondary | ICD-10-CM

## 2017-09-23 NOTE — Telephone Encounter (Signed)
Refill Request for Cholesterol medication. Pravastatin to [facility]  Last visit:  07/29/2017   Lab Results  Component Value Date   CHOL 113 07/29/2017   HDL 45 (L) 07/29/2017   LDLCALC 52 07/29/2017   TRIG 75 07/29/2017   CHOLHDL 2.5 07/29/2017    Follow up on 09/28/2017

## 2017-09-24 MED ORDER — PRAVASTATIN SODIUM 40 MG PO TABS: 40.0000 mg | ORAL_TABLET | Freq: Every day | ORAL | 0 refills | Status: DC

## 2017-09-25 ENCOUNTER — Other Ambulatory Visit: Payer: Self-pay

## 2017-09-25 DIAGNOSIS — E1140 Type 2 diabetes mellitus with diabetic neuropathy, unspecified: Secondary | ICD-10-CM

## 2017-09-25 DIAGNOSIS — Z794 Long term (current) use of insulin: Principal | ICD-10-CM

## 2017-09-25 MED ORDER — GABAPENTIN 300 MG PO CAPS: 300.0000 mg | ORAL_CAPSULE | Freq: Three times a day (TID) | ORAL | 0 refills | Status: DC

## 2017-09-25 NOTE — Telephone Encounter (Signed)
Refill request for general medication. Gabapentin to [facility]   Last office visit 07/29/2017   Follow up on 09/28/2017

## 2017-09-28 ENCOUNTER — Ambulatory Visit: Payer: Medicare Other | Admitting: Family Medicine

## 2017-10-07 ENCOUNTER — Other Ambulatory Visit: Payer: Self-pay | Admitting: Family Medicine

## 2017-10-07 DIAGNOSIS — I129 Hypertensive chronic kidney disease with stage 1 through stage 4 chronic kidney disease, or unspecified chronic kidney disease: Secondary | ICD-10-CM

## 2017-10-07 DIAGNOSIS — N183 Chronic kidney disease, stage 3 (moderate): Principal | ICD-10-CM

## 2017-10-07 NOTE — Telephone Encounter (Signed)
Refill request for Hypertension medication:  Metoprolol   Last office visit pertaining to hypertension: 07/29/2017  BP Readings from Last 3 Encounters:  09/15/17 (!) 149/45  07/29/17 (!) 146/62  05/05/17 (!) 154/53     Lab Results  Component Value Date   CREATININE 1.64 (H) 09/14/2017   BUN 61 (H) 09/14/2017   NA 136 09/14/2017   K 4.1 09/14/2017   CL 107 09/14/2017   CO2 21 (L) 09/14/2017    Follow-ups on file. None indicated.  Medication was discontinue after discharge on 09/15/2017.

## 2017-10-08 DIAGNOSIS — I509 Heart failure, unspecified: Secondary | ICD-10-CM | POA: Diagnosis not present

## 2017-10-15 ENCOUNTER — Other Ambulatory Visit: Payer: Self-pay | Admitting: Family Medicine

## 2017-10-15 DIAGNOSIS — E1140 Type 2 diabetes mellitus with diabetic neuropathy, unspecified: Secondary | ICD-10-CM

## 2017-10-15 DIAGNOSIS — E1144 Type 2 diabetes mellitus with diabetic amyotrophy: Secondary | ICD-10-CM

## 2017-10-15 DIAGNOSIS — I5030 Unspecified diastolic (congestive) heart failure: Secondary | ICD-10-CM

## 2017-10-15 DIAGNOSIS — E1122 Type 2 diabetes mellitus with diabetic chronic kidney disease: Secondary | ICD-10-CM

## 2017-10-15 DIAGNOSIS — N184 Chronic kidney disease, stage 4 (severe): Secondary | ICD-10-CM

## 2017-10-15 DIAGNOSIS — Z794 Long term (current) use of insulin: Principal | ICD-10-CM

## 2017-10-21 ENCOUNTER — Ambulatory Visit: Payer: Medicare Other | Admitting: Nurse Practitioner

## 2017-10-22 ENCOUNTER — Other Ambulatory Visit: Payer: Self-pay | Admitting: Family Medicine

## 2017-10-22 DIAGNOSIS — E1121 Type 2 diabetes mellitus with diabetic nephropathy: Secondary | ICD-10-CM

## 2017-10-22 DIAGNOSIS — E1144 Type 2 diabetes mellitus with diabetic amyotrophy: Secondary | ICD-10-CM

## 2017-10-22 NOTE — Telephone Encounter (Signed)
  Lab Results  Component Value Date   HGBA1C 6.1 07/29/2017

## 2017-10-22 NOTE — Telephone Encounter (Signed)
Refill request for diabetic medication:   Humalog QuikPen  Last office visit pertaining to diabetes: 07/29/2017   Lab Results  Component Value Date   HGBA1C 6.1 07/29/2017    Follow-ups on file. Appointment tomorrow.

## 2017-10-23 ENCOUNTER — Ambulatory Visit (INDEPENDENT_AMBULATORY_CARE_PROVIDER_SITE_OTHER): Payer: Medicare Other | Admitting: Nurse Practitioner

## 2017-10-23 ENCOUNTER — Encounter: Payer: Self-pay | Admitting: Nurse Practitioner

## 2017-10-23 VITALS — BP 136/84 | HR 88 | Temp 98.4°F | Resp 18 | Ht 64.0 in | Wt 304.0 lb

## 2017-10-23 DIAGNOSIS — I129 Hypertensive chronic kidney disease with stage 1 through stage 4 chronic kidney disease, or unspecified chronic kidney disease: Secondary | ICD-10-CM | POA: Diagnosis not present

## 2017-10-23 DIAGNOSIS — E559 Vitamin D deficiency, unspecified: Secondary | ICD-10-CM | POA: Diagnosis not present

## 2017-10-23 DIAGNOSIS — E1122 Type 2 diabetes mellitus with diabetic chronic kidney disease: Secondary | ICD-10-CM | POA: Diagnosis not present

## 2017-10-23 DIAGNOSIS — N183 Chronic kidney disease, stage 3 unspecified: Secondary | ICD-10-CM

## 2017-10-23 DIAGNOSIS — I5030 Unspecified diastolic (congestive) heart failure: Secondary | ICD-10-CM | POA: Diagnosis not present

## 2017-10-23 DIAGNOSIS — K219 Gastro-esophageal reflux disease without esophagitis: Secondary | ICD-10-CM

## 2017-10-23 DIAGNOSIS — Z794 Long term (current) use of insulin: Secondary | ICD-10-CM

## 2017-10-23 DIAGNOSIS — L209 Atopic dermatitis, unspecified: Secondary | ICD-10-CM

## 2017-10-23 DIAGNOSIS — Z5181 Encounter for therapeutic drug level monitoring: Secondary | ICD-10-CM

## 2017-10-23 DIAGNOSIS — N939 Abnormal uterine and vaginal bleeding, unspecified: Secondary | ICD-10-CM | POA: Diagnosis not present

## 2017-10-23 DIAGNOSIS — J302 Other seasonal allergic rhinitis: Secondary | ICD-10-CM

## 2017-10-23 DIAGNOSIS — E1121 Type 2 diabetes mellitus with diabetic nephropathy: Secondary | ICD-10-CM

## 2017-10-23 DIAGNOSIS — B379 Candidiasis, unspecified: Secondary | ICD-10-CM

## 2017-10-23 DIAGNOSIS — J3089 Other allergic rhinitis: Secondary | ICD-10-CM

## 2017-10-23 DIAGNOSIS — E785 Hyperlipidemia, unspecified: Secondary | ICD-10-CM

## 2017-10-23 DIAGNOSIS — N184 Chronic kidney disease, stage 4 (severe): Secondary | ICD-10-CM

## 2017-10-23 MED ORDER — INSULIN GLARGINE 100 UNIT/ML SOLOSTAR PEN: PEN_INJECTOR | SUBCUTANEOUS | 1 refills | Status: DC

## 2017-10-23 MED ORDER — AMLODIPINE BESYLATE 10 MG PO TABS: 10.0000 mg | ORAL_TABLET | Freq: Every day | ORAL | 0 refills | Status: DC

## 2017-10-23 MED ORDER — MEDROXYPROGESTERONE ACETATE 10 MG PO TABS: 20.0000 mg | ORAL_TABLET | Freq: Every day | ORAL | 0 refills | Status: DC

## 2017-10-23 MED ORDER — MONTELUKAST SODIUM 10 MG PO TABS: 10.0000 mg | ORAL_TABLET | Freq: Every day | ORAL | 2 refills | Status: DC

## 2017-10-23 MED ORDER — RANITIDINE HCL 150 MG PO TABS: 150.0000 mg | ORAL_TABLET | Freq: Two times a day (BID) | ORAL | 1 refills | Status: DC

## 2017-10-23 MED ORDER — DESONIDE 0.05 % EX OINT: 1.0000 "application " | TOPICAL_OINTMENT | Freq: Two times a day (BID) | CUTANEOUS | 0 refills | Status: AC

## 2017-10-23 MED ORDER — GLUCOSE BLOOD VI STRP: ORAL_STRIP | 5 refills | Status: DC

## 2017-10-23 MED ORDER — PRAVASTATIN SODIUM 40 MG PO TABS: 40.0000 mg | ORAL_TABLET | Freq: Every day | ORAL | 0 refills | Status: DC

## 2017-10-23 MED ORDER — VITAMIN D (ERGOCALCIFEROL) 1.25 MG (50000 UNIT) PO CAPS: ORAL_CAPSULE | ORAL | 1 refills | Status: DC

## 2017-10-23 MED ORDER — FUROSEMIDE 40 MG PO TABS: 40.0000 mg | ORAL_TABLET | Freq: Every day | ORAL | 0 refills | Status: DC

## 2017-10-23 MED ORDER — INSULIN PEN NEEDLE 32G X 6 MM MISC: 1.0000 "application " | Freq: Four times a day (QID) | 5 refills | Status: AC

## 2017-10-23 MED ORDER — INSULIN LISPRO 100 UNIT/ML (KWIKPEN): PEN_INJECTOR | SUBCUTANEOUS | 0 refills | Status: DC

## 2017-10-23 MED ORDER — CLONIDINE 0.3 MG/24HR TD PTWK: 0.3000 mg | MEDICATED_PATCH | TRANSDERMAL | 0 refills | Status: DC

## 2017-10-23 MED ORDER — INSULIN PEN NEEDLE 32G X 6 MM MISC: 4.0000 "application " | Freq: Four times a day (QID) | 5 refills | Status: DC

## 2017-10-23 MED ORDER — NYSTATIN 100000 UNIT/GM EX POWD: 1.0000 g | Freq: Three times a day (TID) | CUTANEOUS | 1 refills | Status: DC

## 2017-10-23 NOTE — Progress Notes (Signed)
Name: Veronica Anderson   MRN: [id number]    DOB: 31-May-1946   Date:10/23/2017       Progress Note  Subjective  Chief Complaint  Chief Complaint  Patient presents with  . Medication Refill  . Vaginal Bleeding    referral to GYN    HPI  Vaginal bleeding Ongoing for 4-6 months was seen by GYN specialist at [facility]- negative [place] but wanted to do a surgical procedure but needed cardiology clearance; family was having trouble getting off for all appointments but finally got scheduling right and requesting referral to local OBGYN. Patient wears diapers- need to be changed 2-3 times a day. When she first had this she had large clots which are no longer present but has dark blood. Denies abdominal pain, shob, cp. Has had to have a blood transfusion in the past; notes that she does have generalized weakness  HTN Patient takes amlodipine, catapress and lasix. Controlled today; follows up with Dr. Clayborn Bigness.  BP Readings from Last 3 Encounters:  10/23/17 136/84  09/15/17 (!) 149/45  07/29/17 (!) 146/62   DM2  Humalog adjusted by meals takes based on sugars before meals. And takes lantus 28 units at night.  Hasn't checked sugars for the past few months because she doesn't have test strips. Patient is not eating consistently. Patient states doesn't know if she has had episode of hypoglycemia. Kidney dr saw patient in the hospital and was told she was stable but needed to follow-up   Lab Results  Component Value Date   HGBA1C 6.1 07/29/2017    Allergic rhinitis Singulair helps with allergic rhinitis. Has not been worse with the pollen yet.   CHF Dr. Clayborn Bigness saw patient in the hospital in February and cleared patient- told to continue current regimen Pt endorses left leg swelling- decreasing now. Denies shob  HLD pracastatin taken every night, denies myalgias  GERD Zantac BID- no symptoms with medication, triggers: acidic foods and eating late at night  Vitamin D Vitamin D- ran out a few weeks  ago  Patient Active Problem List   Diagnosis Date Noted  . UTI (urinary tract infection) 09/11/2017  . Pressure injury of skin 09/11/2017  . Chronic kidney disease (CKD), stage IV (severe) ([place]) 07/29/2017  . Mild protein-calorie malnutrition ([place]) 07/29/2017  . Anemia due to chronic kidney disease   . Prolonged Q-T interval on ECG 12/23/2016  . CHF (congestive heart failure), NYHA class I ([place]) 11/07/2016  . Nocturnal oxygen desaturation 06/26/2015  . Anemia in chronic illness 02/23/2015  . Asthma, mild intermittent 02/23/2015  . Benign essential HTN 02/23/2015  . Diastolic heart failure, NYHA class 3 ([place]) 02/23/2015  . Benign hypertension with chronic kidney disease, stage III ([place]) 02/23/2015  . CN (constipation) 02/23/2015  . Diabetes mellitus with neuropathy ([place]) 02/23/2015  . Dyslipidemia 02/23/2015  . Elevated ferritin 02/23/2015  . Gastro-esophageal reflux disease without esophagitis 02/23/2015  . LBP (low back pain) 02/23/2015  . Morbid obesity due to excess calories ([place]) 02/23/2015  . Obstructive apnea 02/23/2015  . Osteoarthritis 02/23/2015  . Neuropathy 02/23/2015  . Perennial allergic rhinitis with seasonal variation 02/23/2015  . History of pneumonia 02/23/2015  . Vitamin D deficiency 02/23/2015  . Cataract 04/16/2010  . Background diabetic retinopathy ([place]) 12/18/2009    Past Medical History:  Diagnosis Date  . Allergy   . Anemia   . Asthma   . Cataract    bilateral  . CHF (NYHA class II, ACC/AHA stage C) ([place])   . Chronic  kidney disease    stage IV (severe), Dr. Aleene Davidson  . Constipation   . Diabetes mellitus without complication ([place])   . Edema    feet/ankles  . GERD (gastroesophageal reflux disease)   . Hyperlipidemia   . Hypertension   . Left ankle pain   . Lumbago   . Myocardial infarction ([place])   . Neuropathy   . Neuropathy   . OA (osteoarthritis)   . Obesity   . Obstructive sleep apnea syndrome    CPAP  . Paresthesia    Foot  .  Protein calorie malnutrition ([place])   . Requires supplemental oxygen   . Stroke ([place])   . Vitamin D deficiency     Past Surgical History:  Procedure Laterality Date  . ANKLE SURGERY Left   . CATARACT EXTRACTION W/PHACO Right 12/20/2015   Procedure: CATARACT EXTRACTION PHACO AND INTRAOCULAR LENS PLACEMENT (IOC);  Surgeon: Eulogio Bear, MD;  Location: [facility] ORS;  Service: Ophthalmology;  Laterality: Right;  [place] 2.50 AP% 21.8 CDE 37.04 Fluid pack lot # [id number]  . CATARACT EXTRACTION W/PHACO Left 02/14/2016   Procedure: CATARACT EXTRACTION PHACO AND INTRAOCULAR LENS PLACEMENT (IOC);  Surgeon: Eulogio Bear, MD;  Location: [facility] ORS;  Service: Ophthalmology;  Laterality: Left;  [place] 3.12 AP% 42.1 CDE 47.12 Fluid Pack lot # [id number]  . COLONOSCOPY    . FRACTURE SURGERY Left    ankle    Social History   Tobacco Use  . Smoking status: Former Smoker    Types: Cigarettes    Last attempt to quit: 07/14/1994    Years since quitting: 23.2  . Smokeless tobacco: Never Used  Substance Use Topics  . Alcohol use: No    Alcohol/week: 0.0 oz     Current Outpatient Medications:  .  ACCU-CHEK AVIVA PLUS test strip, CHECK BLOOD SUGAR TWICE DAILY, Disp: 200 each, Rfl: 5 .  amLODipine (NORVASC) 10 MG tablet, Take 1 tablet (10 mg total) by mouth daily., Disp: 90 tablet, Rfl: 0 .  aspirin EC 81 MG tablet, Take 81 mg by mouth at bedtime., Disp: , Rfl:  .  cloNIDine (CATAPRES - DOSED IN MG/24 HR) 0.3 mg/24hr patch, Place 1 patch (0.3 mg total) onto the skin once a week., Disp: 4 patch, Rfl: 0 .  desonide (DESOWEN) 0.05 % ointment, Apply 1 application topically 2 (two) times daily., Disp: 60 g, Rfl: 0 .  ferrous sulfate 325 (65 FE) MG tablet, Take 1 tablet (325 mg total) by mouth 2 (two) times daily with a meal., Disp: 60 tablet, Rfl: 3 .  furosemide (LASIX) 40 MG tablet, Take 1 tablet (40 mg total) by mouth daily., Disp: 30 tablet, Rfl: 0 .  gabapentin (NEURONTIN) 300 MG capsule, Take 1 capsule (300  mg total) by mouth 3 (three) times daily., Disp: 270 capsule, Rfl: 0 .  glucose blood (ACCU-CHEK AVIVA PLUS) test strip, 1 each by Other route 2 (two) times daily. Use as instructed, Disp: 200 each, Rfl: 3 .  Insulin Glargine (LANTUS SOLOSTAR) 100 UNIT/ML Solostar Pen, INJECT 35 UNITS SUBCUTANEOUSLY DAILY, Disp: 15 pen, Rfl: 1 .  insulin lispro (HUMALOG KWIKPEN) 100 UNIT/ML KiwkPen, INJECT 5 UNITS SUBCUTANEOUSLY BEFORE MEALS; Dx E11.40, LON 99 months, Disp: 5 pen, Rfl: 0 .  medroxyPROGESTERone (PROVERA) 10 MG tablet, Take 20 mg by mouth daily., Disp: , Rfl: 3 .  montelukast (SINGULAIR) 10 MG tablet, Take 1 tablet (10 mg total) daily by mouth., Disp: 90 tablet, Rfl: 2 .  NOVOFINE 32G X 6  MM MISC, USE 4 TIMES A DAY AS DIRECTED, Disp: 100 each, Rfl: 5 .  nystatin (NYSTATIN) powder, Apply 1 g topically 3 (three) times daily., Disp: 45 g, Rfl: 1 .  pravastatin (PRAVACHOL) 40 MG tablet, Take 1 tablet (40 mg total) by mouth daily., Disp: 30 tablet, Rfl: 0 .  ranitidine (ZANTAC) 150 MG tablet, Take 1 tablet (150 mg total) by mouth 2 (two) times daily. Trying to wean off Omeprazole, Disp: 180 tablet, Rfl: 1 .  senna-docusate (SENOKOT-S) 8.6-50 MG tablet, Take 2 tablets by mouth daily as needed for mild constipation., Disp: 30 tablet, Rfl: 1 .  Vitamin D, Ergocalciferol, (DRISDOL) 50000 units CAPS capsule, TAKE ONE CAPSULE BY MOUTH ONCE A WEEK (Patient taking differently: TAKE ONE CAPSULE BY MOUTH ONCE A WEEK On Sunday), Disp: 4 capsule, Rfl: 1  Allergies  Allergen Reactions  . Ace Inhibitors Cough    ROS  Constitutional: Negative for fever or weight change.  Respiratory: Negative for cough and shortness of breath.   Cardiovascular: Negative for chest pain or palpitations.  Gastrointestinal: Negative for abdominal pain, no bowel changes.  Musculoskeletal: Positive- gait change in wheel chair since hospitalization has been improving walk a little with assistance or joint swelling.  Skin: Negative for  rash- was under breast in the past and around mouth from CPAP- resolved.  Neurological: Negative for dizziness or headache.  No other specific complaints in a complete review of systems (except as listed in HPI above).  Objective  Vitals:   10/23/17 1011  BP: 136/84  Pulse: 88  Resp: 18  Temp: 98.4 F (36.9 C)  TempSrc: Oral  SpO2: 94%  Weight: (!) 304 lb (137.9 kg)  Height: 5\' 4"  (1.626 m)     Body mass index is 52.18 kg/m.  Nursing Note and Vital Signs reviewed.  Physical Exam  Constitutional: Patient appears well-developed and well-nourished. Obese  No distress. Able to speak full sentences, in wheel chair, jovial  HEENT: head atraumatic, normocephalic, pupils equal and reactive to light,  no maxillary or frontal sinus tenderness , neck supple without lymphadenopathy, oropharynx pink and moist without exudate, no nasal discharge, no carotid bruits, poor dental hygiene  Cardiovascular: Normal rate, regular rhythm, S1/S2 present.  Pulses intact, skin warm and dry  Pulmonary/Chest: Effort normal and breath sounds diminished throughout likely due to large body habitus- no adventitious breath sounds noted.  Abdominal: Soft and non-tender, bowel sounds present  Psychiatric: Patient has a normal mood and affect. behavior is normal. Judgment and thought content normal.  Diabetic Foot Exam - Simple   Simple Foot Form Diabetic Foot exam was performed with the following findings:  Yes 10/23/2017 12:25 PM  Visual Inspection See comments:  Yes Sensation Testing Intact to touch and monofilament testing bilaterally:  Yes Pulse Check Posterior Tibialis and Dorsalis pulse intact bilaterally:  Yes Comments Patient has well healing closed blister to left lateral aspect of foot.      No results found for this or any previous visit (from the past 72 hour(s)).  Assessment & Plan  1. Vaginal bleeding -Stable, will check CBC today to identify anemia, continue iron pills will send  urgent referral to GYN for further evaluation. - Ambulatory referral to Gynecology - CBC - medroxyPROGESTERone (PROVERA) 10 MG tablet; Take 2 tablets (20 mg total) by mouth daily.  Dispense: 28 tablet; Refill: 0  2. Benign hypertension with CKD (chronic kidney disease) stage III (HCC) -Stable was seen by nephrology and cardiology in February of last hospitalization;  patient will continue to follow up outpatient - amLODipine (NORVASC) 10 MG tablet; Take 1 tablet (10 mg total) by mouth daily.  Dispense: 90 tablet; Refill: 0 - cloNIDine (CATAPRES - DOSED IN MG/24 HR) 0.3 mg/24hr patch; Place 1 patch (0.3 mg total) onto the skin once a week.  Dispense: 4 patch; Refill: 0 - furosemide (LASIX) 40 MG tablet; Take 1 tablet (40 mg total) by mouth daily.  Dispense: 30 tablet; Refill: 0 - COMPLETE METABOLIC PANEL WITH GFR  4. Perennial allergic rhinitis with seasonal variation -Stable avoid triggers - montelukast (SINGULAIR) 10 MG tablet; Take 1 tablet (10 mg total) by mouth daily.  Dispense: 90 tablet; Refill: 2  5. Type 2 diabetes mellitus with stage 4 chronic kidney disease, with long-term current use of insulin ([place]) -continue to follow-up with nephrology outpatient we will draw labs today.  Refilled medications and reordered test strips and patient will monitor blood sugar at home - Insulin Glargine (LANTUS SOLOSTAR) 100 UNIT/ML Solostar Pen; INJECT 35 UNITS SUBCUTANEOUSLY DAILY  Dispense: 15 pen; Refill: 1 - insulin lispro (HUMALOG KWIKPEN) 100 UNIT/ML KiwkPen; INJECT 5 UNITS SUBCUTANEOUSLY BEFORE MEALS; Dx E11.40, LON 99 months  Dispense: 5 pen; Refill: 0 - HgB A1c - Insulin Pen Needle (NOVOFINE) 32G X 6 MM MISC; Inject 1 application into the skin 4 (four) times daily.  Dispense: 100 each; Refill: 5  6. Diastolic heart failure, NYHA class 3 (HCC) Continue to follow-up with Dr. Clayborn Bigness - furosemide (LASIX) 40 MG tablet; Take 1 tablet (40 mg total) by mouth daily.  Dispense: 30 tablet; Refill:  0  7. Dyslipidemia Stable continue medication and diet - pravastatin (PRAVACHOL) 40 MG tablet; Take 1 tablet (40 mg total) by mouth daily.  Dispense: 30 tablet; Refill: 0  8. Gastro-esophageal reflux disease without esophagitis -Discussed diet continue medication as needed - ranitidine (ZANTAC) 150 MG tablet; Take 1 tablet (150 mg total) by mouth 2 (two) times daily. Trying to wean off Omeprazole  Dispense: 180 tablet; Refill: 1  9. Vitamin D deficiency Please take medication once weekly - Vitamin D, Ergocalciferol, (DRISDOL) 50000 units CAPS capsule; TAKE ONE CAPSULE BY MOUTH ONCE A WEEK On Sunday  Dispense: 4 capsule; Refill: 1  10. Atopic dermatitis, unspecified type -Cleared, due to difficulty with transportation requested ointment if needed in the future due to CPAP machine rubbing on face.  Son with patient, assist with medications and cares for her mother well-will prescribe informed only to use when necessary. - desonide (DESOWEN) 0.05 % ointment; Apply 1 application topically 2 (two) times daily.  Dispense: 60 g; Refill: 0  11. Candida infection -Cleared, due to difficulty with transportation requested ointment if needed in the future due to CPAP machine rubbing on face.  Son with patient, assist with medications and cares for her mother well-will prescribe informed only to use when necessary.  Keep area clean and dry. - nystatin (NYSTATIN) powder; Apply 1 g topically 3 (three) times daily.  Dispense: 45 g; Refill: 1  12. Medication monitoring encounter  - COMPLETE METABOLIC PANEL WITH GFR   -Red flags and when to present for emergency care or RTC including fever >101.7F, chest pain, shortness of breath, new/worsening/un-resolving symptoms reviewed with patient at time of visit. Follow up and care instructions discussed and provided in AVS. -Reviewed Health Maintenance: foot exam completed today, eye exam completed last fall with cataract surgery will request notes.

## 2017-10-24 LAB — COMPLETE METABOLIC PANEL WITH GFR
AG RATIO: 0.8 (calc) — AB (ref 1.0–2.5)
ALKALINE PHOSPHATASE (APISO): 75 U/L (ref 33–130)
ALT: 4 U/L — AB (ref 6–29)
AST: 8 U/L — ABNORMAL LOW (ref 10–35)
Albumin: 3.2 g/dL — ABNORMAL LOW (ref 3.6–5.1)
BILIRUBIN TOTAL: 0.3 mg/dL (ref 0.2–1.2)
BUN/Creatinine Ratio: 12 (calc) (ref 6–22)
BUN: 16 mg/dL (ref 7–25)
CHLORIDE: 104 mmol/L (ref 98–110)
CO2: 35 mmol/L — ABNORMAL HIGH (ref 20–32)
Calcium: 8.7 mg/dL (ref 8.6–10.4)
Creat: 1.34 mg/dL — ABNORMAL HIGH (ref 0.60–0.93)
GFR, EST AFRICAN AMERICAN: 46 mL/min/{1.73_m2} — AB (ref >=60)
GFR, Est Non African American: 40 mL/min/{1.73_m2} — ABNORMAL LOW (ref >=60)
Globulin: 3.9 g/dL (calc) — ABNORMAL HIGH (ref 1.9–3.7)
Glucose, Bld: 188 mg/dL — ABNORMAL HIGH (ref 65–99)
POTASSIUM: 4.5 mmol/L (ref 3.5–5.3)
Sodium: 144 mmol/L (ref 135–146)
TOTAL PROTEIN: 7.1 g/dL (ref 6.1–8.1)

## 2017-10-24 LAB — HEMOGLOBIN A1C
EAG (MMOL/L): 7.4 (calc)
Hgb A1c MFr Bld: 6.3 % of total Hgb — ABNORMAL HIGH (ref <5.7)
Mean Plasma Glucose: 134 (calc)

## 2017-10-24 LAB — CBC
HCT: 29.5 % — ABNORMAL LOW (ref 35.0–45.0)
Hemoglobin: 8.7 g/dL — ABNORMAL LOW (ref 11.7–15.5)
MCH: 22.3 pg — AB (ref 27.0–33.0)
MCHC: 29.5 g/dL — AB (ref 32.0–36.0)
MCV: 75.4 fL — AB (ref 80.0–100.0)
MPV: 9.8 fL (ref 7.5–12.5)
PLATELETS: 359 10*3/uL (ref 140–400)
RBC: 3.91 10*6/uL (ref 3.80–5.10)
RDW: 16.2 % — ABNORMAL HIGH (ref 11.0–15.0)
WBC: 9.7 10*3/uL (ref 3.8–10.8)

## 2017-10-26 ENCOUNTER — Telehealth: Payer: Self-pay | Admitting: Obstetrics & Gynecology

## 2017-10-26 NOTE — Telephone Encounter (Signed)
[facility] referring for Vaginal bleeding. Called and left message with Family to have patient call back to be schedule.

## 2017-10-26 NOTE — Telephone Encounter (Signed)
Called and left voice mail for patient to call back to be schedule °

## 2017-11-04 ENCOUNTER — Ambulatory Visit: Payer: Medicare Other | Admitting: Obstetrics and Gynecology

## 2017-11-04 ENCOUNTER — Encounter: Payer: Self-pay | Admitting: Obstetrics and Gynecology

## 2017-11-08 DIAGNOSIS — I509 Heart failure, unspecified: Secondary | ICD-10-CM | POA: Diagnosis not present

## 2017-11-12 ENCOUNTER — Other Ambulatory Visit: Payer: Self-pay | Admitting: Nurse Practitioner

## 2017-11-12 DIAGNOSIS — E559 Vitamin D deficiency, unspecified: Secondary | ICD-10-CM

## 2017-11-12 DIAGNOSIS — N183 Chronic kidney disease, stage 3 (moderate): Principal | ICD-10-CM

## 2017-11-12 DIAGNOSIS — I129 Hypertensive chronic kidney disease with stage 1 through stage 4 chronic kidney disease, or unspecified chronic kidney disease: Secondary | ICD-10-CM

## 2017-11-12 DIAGNOSIS — D638 Anemia in other chronic diseases classified elsewhere: Secondary | ICD-10-CM

## 2017-11-12 NOTE — Telephone Encounter (Signed)
Please call patient to come in for blood work this week to check vitamin D levels prior to refilling prescription. Will also recheck CBC

## 2017-11-16 ENCOUNTER — Encounter: Payer: Self-pay | Admitting: Obstetrics & Gynecology

## 2017-11-16 NOTE — Telephone Encounter (Signed)
Spoke to son [name], for him to bring her by for labs. No appointment needed.

## 2017-11-17 ENCOUNTER — Other Ambulatory Visit: Payer: Self-pay | Admitting: Nurse Practitioner

## 2017-11-17 DIAGNOSIS — N183 Chronic kidney disease, stage 3 unspecified: Secondary | ICD-10-CM

## 2017-11-17 DIAGNOSIS — I5030 Unspecified diastolic (congestive) heart failure: Secondary | ICD-10-CM

## 2017-11-17 DIAGNOSIS — I129 Hypertensive chronic kidney disease with stage 1 through stage 4 chronic kidney disease, or unspecified chronic kidney disease: Secondary | ICD-10-CM

## 2017-11-17 DIAGNOSIS — E785 Hyperlipidemia, unspecified: Secondary | ICD-10-CM

## 2017-11-18 ENCOUNTER — Ambulatory Visit (INDEPENDENT_AMBULATORY_CARE_PROVIDER_SITE_OTHER): Payer: Medicare Other | Admitting: Obstetrics & Gynecology

## 2017-11-18 ENCOUNTER — Encounter: Payer: Self-pay | Admitting: Obstetrics & Gynecology

## 2017-11-18 VITALS — BP 148/82 | Ht 60.0 in | Wt 289.0 lb

## 2017-11-18 DIAGNOSIS — N939 Abnormal uterine and vaginal bleeding, unspecified: Secondary | ICD-10-CM | POA: Diagnosis not present

## 2017-11-18 DIAGNOSIS — D251 Intramural leiomyoma of uterus: Secondary | ICD-10-CM

## 2017-11-18 DIAGNOSIS — D250 Submucous leiomyoma of uterus: Secondary | ICD-10-CM | POA: Diagnosis not present

## 2017-11-18 DIAGNOSIS — N950 Postmenopausal bleeding: Secondary | ICD-10-CM | POA: Diagnosis not present

## 2017-11-18 MED ORDER — MEDROXYPROGESTERONE ACETATE 10 MG PO TABS: 20.0000 mg | ORAL_TABLET | Freq: Every day | ORAL | 1 refills | Status: AC

## 2017-11-18 NOTE — Progress Notes (Signed)
Postmenopausal Bleeding Patient complains of vaginal bleeding. She has been menopausal for several years. Currently on no HRT. Bleeding is described as less flow than a normal period and has occurred several times over the past 6 mos. Other menopausal symptoms include: none. Workup to date: endometrial biopsy and pelvic ultrasound.  EMB inconclusive and US showing enlarged uterus and thickened endometrial lining, and fibroids.  This was done at [facility] office in Feb, with rec's for D&C.  No pain.  No recent blood thinners.  PMHx: She  has a past medical history of Allergy, Anemia, Asthma, Cataract, CHF (NYHA class II, ACC/AHA stage C) ([place]), Chronic kidney disease, Constipation, Diabetes mellitus without complication ([place]), Edema, GERD (gastroesophageal reflux disease), Hyperlipidemia, Hypertension, Left ankle pain, Lumbago, Myocardial infarction ([place]), Neuropathy, Neuropathy, OA (osteoarthritis), Obesity, Obstructive sleep apnea syndrome, Paresthesia, Protein calorie malnutrition ([place]), Requires supplemental oxygen, Stroke ([place]), and Vitamin D deficiency. Also,  has a past surgical history that includes Ankle surgery (Left); Colonoscopy; Fracture surgery (Left); Cataract extraction w/PHACO (Right, 12/20/2015); and Cataract extraction w/PHACO (Left, 02/14/2016)., family history includes Diabetes type II in her unknown relative.,  reports that she quit smoking about 23 years ago. Her smoking use included cigarettes. She has never used smokeless tobacco. She reports that she does not drink alcohol or use drugs.  She has a current medication list which includes the following prescription(s): amlodipine, aspirin ec, clonidine, desonide, ferrous sulfate, furosemide, gabapentin, glucose blood, glucose blood, insulin glargine, insulin lispro, insulin pen needle, medroxyprogesterone, montelukast, nystatin, pravastatin, ranitidine, senna-docusate, and vitamin d (ergocalciferol). Also, is allergic to ace  inhibitors.  Review of Systems  Constitutional: Negative for chills, fever and malaise/fatigue.  HENT: Negative for congestion, sinus pain and sore throat.   Eyes: Negative for blurred vision and pain.  Respiratory: Negative for cough and wheezing.   Cardiovascular: Negative for chest pain and leg swelling.  Gastrointestinal: Negative for abdominal pain, constipation, diarrhea, heartburn, nausea and vomiting.  Genitourinary: Negative for dysuria, frequency, hematuria and urgency.  Musculoskeletal: Negative for back pain, joint pain, myalgias and neck pain.  Skin: Negative for itching and rash.  Neurological: Negative for dizziness, tremors and weakness.  Endo/Heme/Allergies: Does not bruise/bleed easily.  Psychiatric/Behavioral: Negative for depression. The patient is not nervous/anxious and does not have insomnia.    Objective: BP (!) 148/82   Ht 5' (1.524 m)   Wt 289 lb (131.1 kg)   BMI 56.44 kg/m  Physical Exam  Constitutional: She is oriented to person, place, and time. She appears well-developed and well-nourished. No distress.  Morbidly obese  Musculoskeletal: Normal range of motion.  Neurological: She is alert and oriented to person, place, and time.  Skin: Skin is warm and dry.  Psychiatric: She has a normal mood and affect.  Vitals reviewed.  ASSESSMENT/PLAN:   Problem List Items Addressed This Visit      Other   Morbid obesity due to excess calories ([place])   Post-menopausal bleeding - Primary   Relevant Orders   Ambulatory referral to Gynecology    Other Visit Diagnoses    Intramural and submucous leiomyoma of uterus       Relevant Orders   Ambulatory referral to Gynecology   Vaginal bleeding       Relevant Medications   medroxyPROGESTERone (PROVERA) 10 MG tablet    Risks of obesity, endometrial thickening, cancer discussed.  Need for tissue sample. Risks of surgery discussed.  Plan to return to [facility] for treatment (high risk). Provera can help w episodes  of bleeding but should not be long term treatment plan.  Barnett Applebaum, MD, [facility], [facility] 11/18/2017  10:03 AM

## 2017-12-08 DIAGNOSIS — I509 Heart failure, unspecified: Secondary | ICD-10-CM | POA: Diagnosis not present

## 2017-12-13 ENCOUNTER — Other Ambulatory Visit: Payer: Self-pay | Admitting: Nurse Practitioner

## 2017-12-13 DIAGNOSIS — E785 Hyperlipidemia, unspecified: Secondary | ICD-10-CM

## 2017-12-15 ENCOUNTER — Telehealth: Payer: Self-pay | Admitting: Obstetrics & Gynecology

## 2017-12-15 ENCOUNTER — Other Ambulatory Visit: Payer: Self-pay | Admitting: Obstetrics & Gynecology

## 2017-12-15 IMAGING — US US RENAL
1 series · 14 of 25 positions shown · non-contrast
Comparison: None.

CLINICAL DATA: 71 y/o  F; acute renal failure.

EXAM:
RENAL / URINARY TRACT ULTRASOUND COMPLETE

[Series 1: us renal · 0.27mm/px · 14 of 31 slices shown]
[im 1/31]
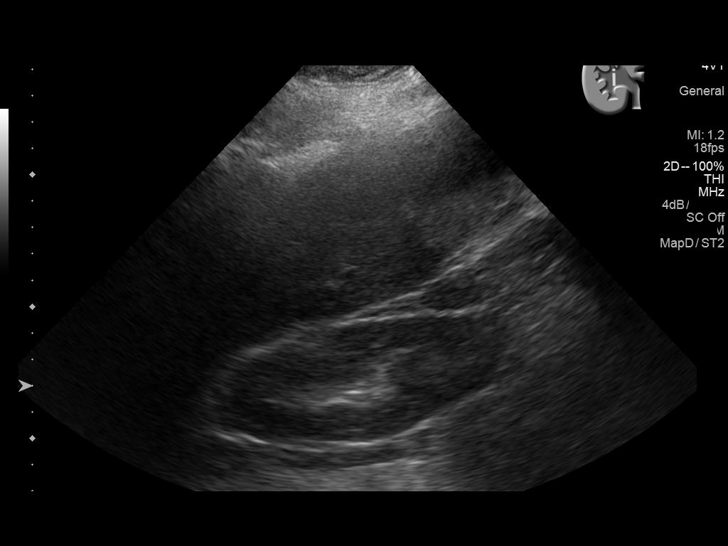
[im 3/31]
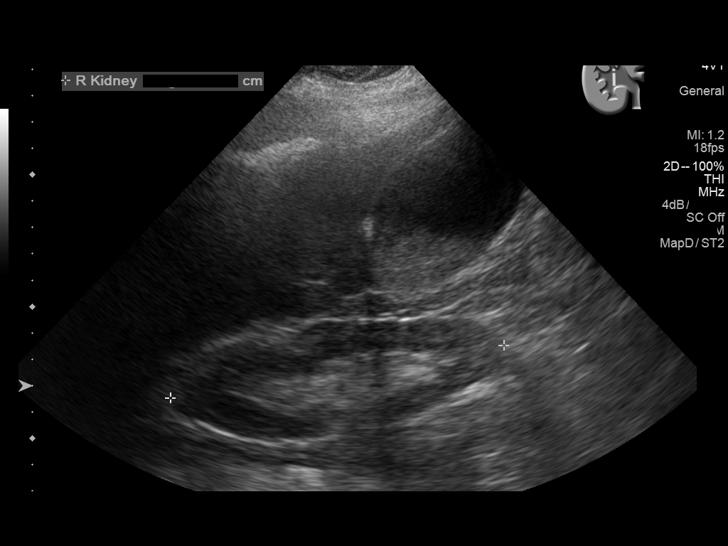
[im 6/31]
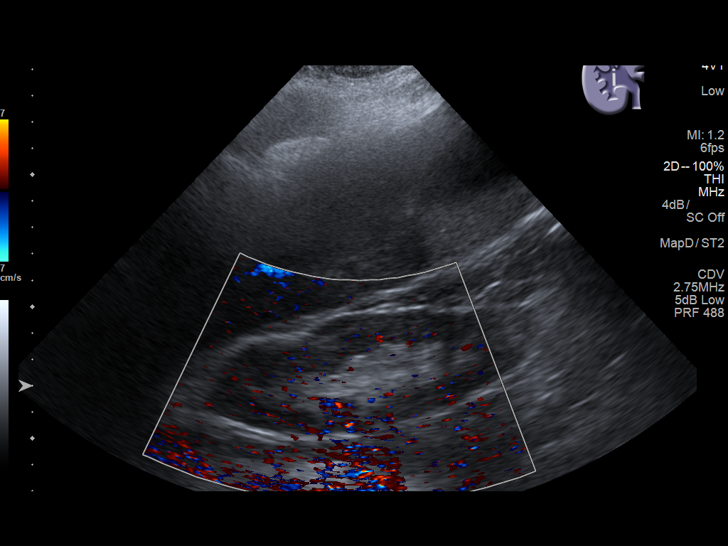
[im 8/31]
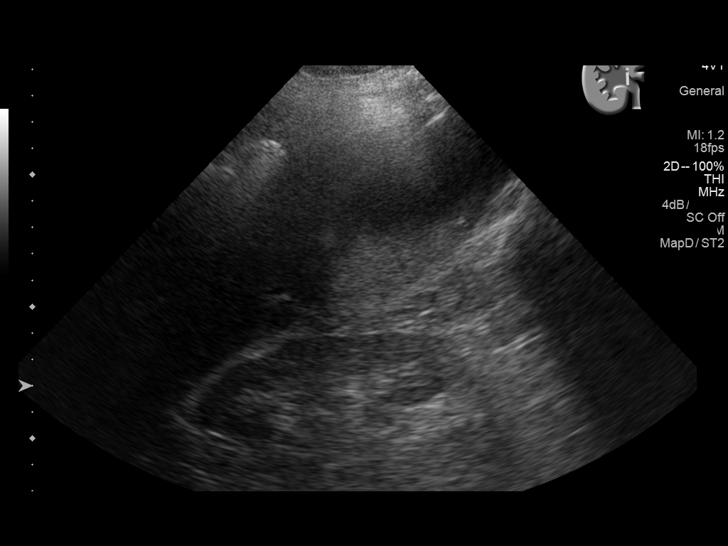
[im 11/31]
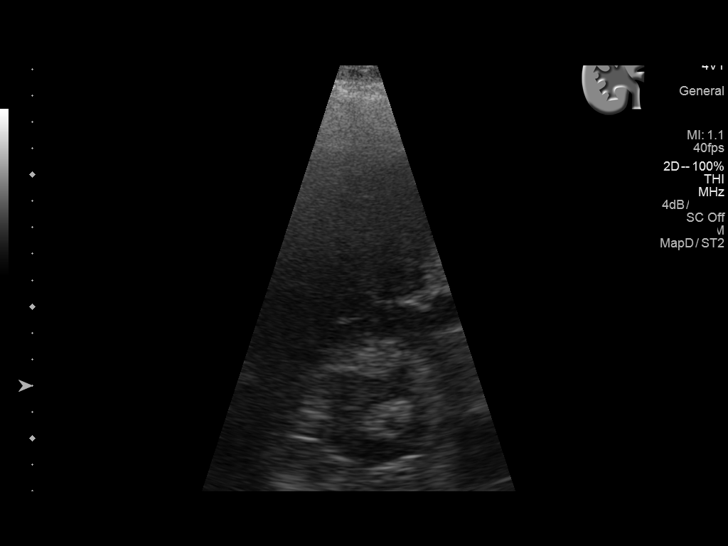
[im 12/31]
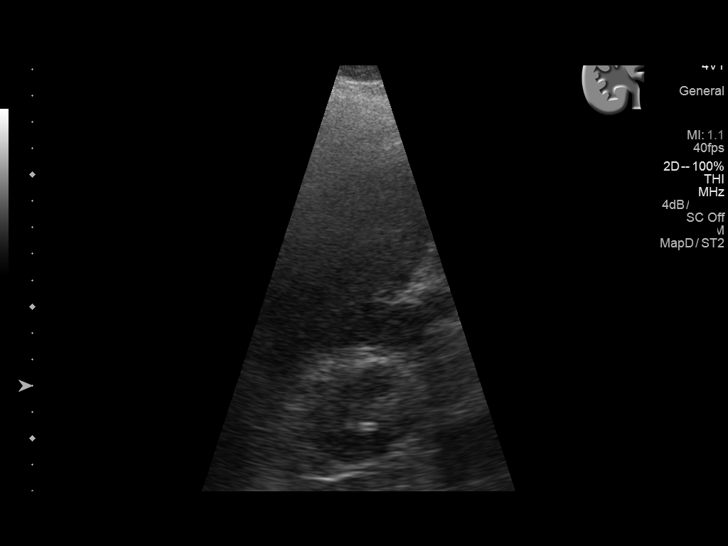
[im 14/31]
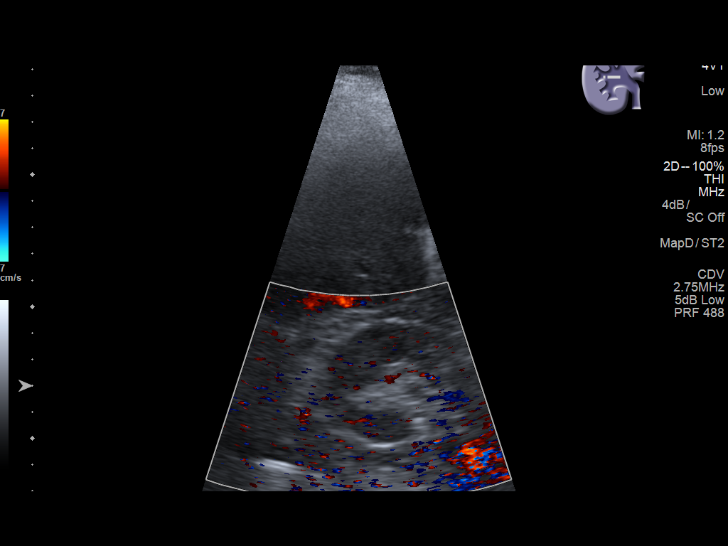
[im 17/31]
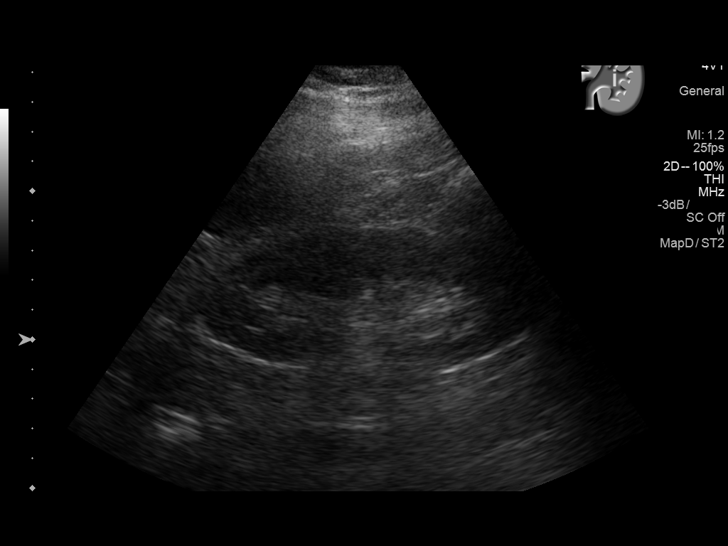
[im 19/31]
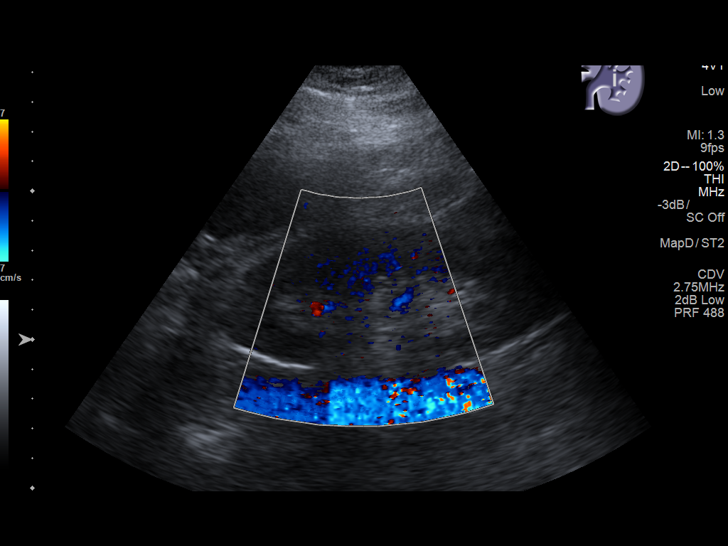
[im 21/31]
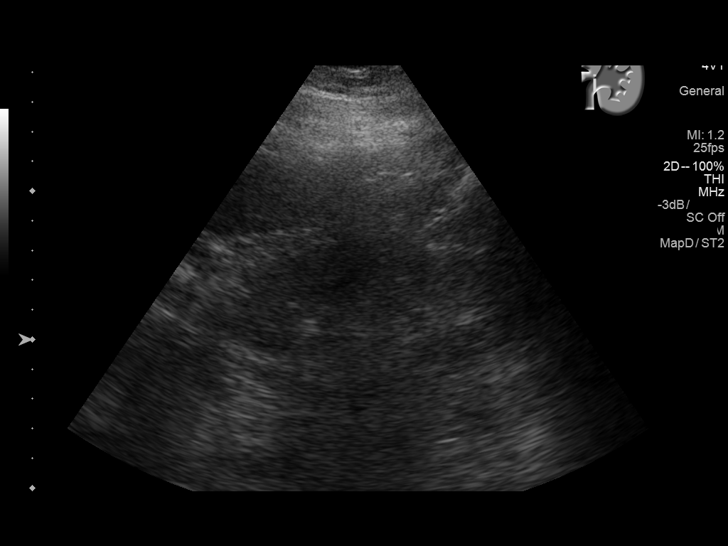
[im 23/31]
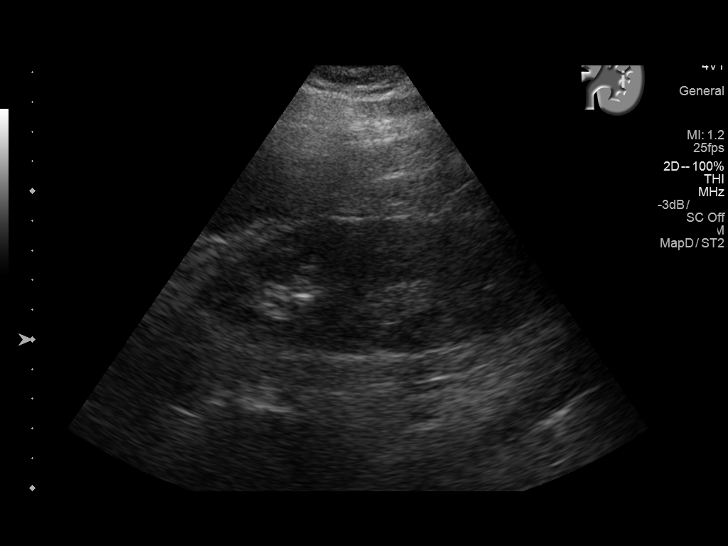
[im 26/31]
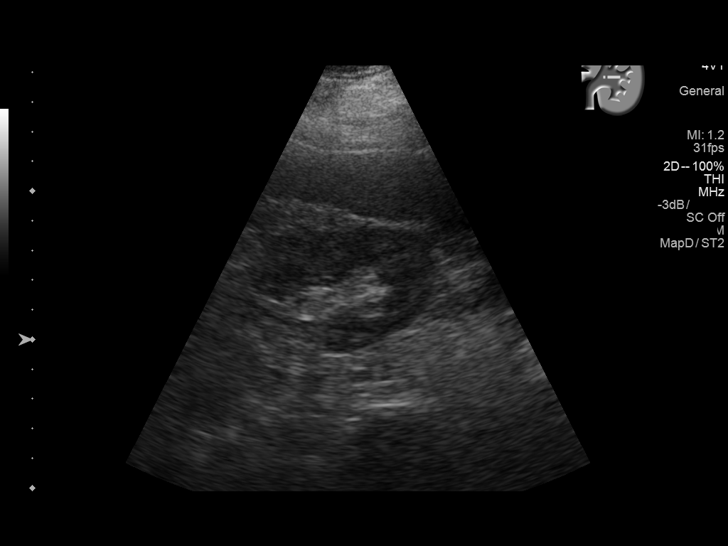
[im 28/31]
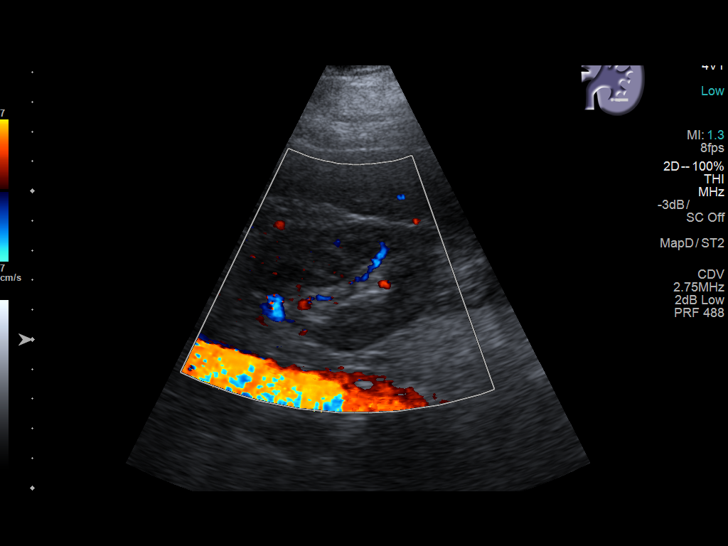
[im 31/31]
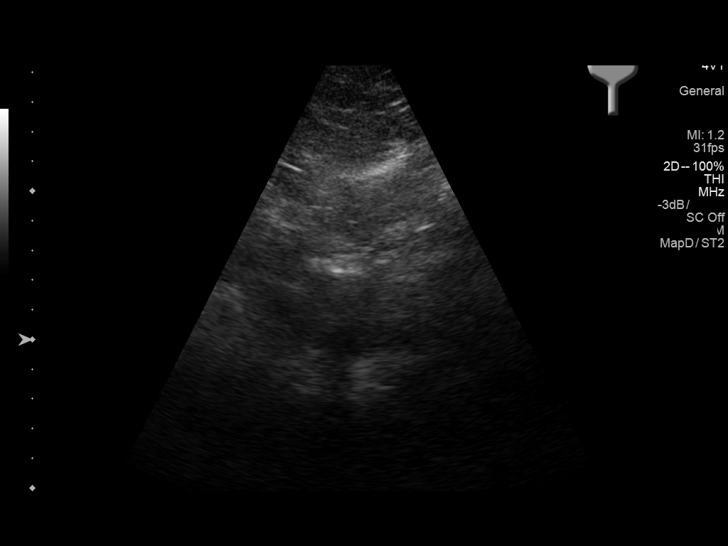

[14 of 25 positions shown; findings below may reference images not displayed]

FINDINGS: Right Kidney:

Length: 12.8 cm. Echogenicity within normal limits. No mass or
hydronephrosis visualized.

Left Kidney:

Length: 11.0 cm. Echogenicity within normal limits. No mass or
hydronephrosis visualized.

Bladder:

Decompressed by Foley catheter. Incidentally noted sludge in the
gallbladder.
IMPRESSION: Normal renal echogenicity. No hydronephrosis or renal mass
identified. Mild left kidney atrophy. Gallbladder sludge.

By: Roberto Tiger M.D.

## 2017-12-15 NOTE — Telephone Encounter (Signed)
I contacted the patient regarding her missed appointment at [facility]. She said they wanted her to see her cardiologist, Dr. Clayborn Bigness, for clearance. Her appointment w/ Dr. Clayborn Bigness is on 12/24/17.

## 2017-12-15 NOTE — Progress Notes (Signed)
Patient was refferd to [facility], and failed to show for appointment.   Will assist with redoing the referral if patient desires to go for treatment. Pt high risk for surgery and in need of care for postmenopausal bleeding with risks for cancer. Dr Kenton Kingfisher

## 2017-12-22 ENCOUNTER — Other Ambulatory Visit: Payer: Self-pay | Admitting: Family Medicine

## 2017-12-22 DIAGNOSIS — E1140 Type 2 diabetes mellitus with diabetic neuropathy, unspecified: Secondary | ICD-10-CM

## 2017-12-22 DIAGNOSIS — Z794 Long term (current) use of insulin: Principal | ICD-10-CM

## 2017-12-24 DIAGNOSIS — E119 Type 2 diabetes mellitus without complications: Secondary | ICD-10-CM | POA: Diagnosis not present

## 2017-12-24 DIAGNOSIS — Z01818 Encounter for other preprocedural examination: Secondary | ICD-10-CM | POA: Diagnosis not present

## 2017-12-24 DIAGNOSIS — E782 Mixed hyperlipidemia: Secondary | ICD-10-CM | POA: Diagnosis not present

## 2017-12-24 DIAGNOSIS — I5032 Chronic diastolic (congestive) heart failure: Secondary | ICD-10-CM | POA: Diagnosis not present

## 2018-01-05 ENCOUNTER — Other Ambulatory Visit: Payer: Self-pay | Admitting: Nurse Practitioner

## 2018-01-05 DIAGNOSIS — E785 Hyperlipidemia, unspecified: Secondary | ICD-10-CM

## 2018-01-08 DIAGNOSIS — I509 Heart failure, unspecified: Secondary | ICD-10-CM | POA: Diagnosis not present

## 2018-01-21 ENCOUNTER — Ambulatory Visit: Payer: Medicare Other | Admitting: Family Medicine

## 2018-01-25 ENCOUNTER — Ambulatory Visit: Payer: Medicare Other | Admitting: Family Medicine

## 2018-01-28 ENCOUNTER — Other Ambulatory Visit: Payer: Self-pay | Admitting: Family Medicine

## 2018-01-30 ENCOUNTER — Other Ambulatory Visit: Payer: Self-pay | Admitting: Nurse Practitioner

## 2018-01-30 DIAGNOSIS — E785 Hyperlipidemia, unspecified: Secondary | ICD-10-CM

## 2018-02-01 ENCOUNTER — Telehealth: Payer: Self-pay | Admitting: Family Medicine

## 2018-02-01 NOTE — Telephone Encounter (Signed)
Copied from [place] [id number]. Topic: Quick Communication - See Telephone Encounter >> Feb 01, 2018  4:28 PM Vernona Rieger wrote: CRM for notification. See Telephone encounter for: 02/01/18.  Patient's son called and said that Dr Ancil Boozer denied her medication " furosemide (LASIX) 40 MG tablet " due to needing an appointment. I advised him that she has canceled or no show the last 7 appointments and has not been in since January and that yes he would need an appointment. He then preceded to tell me that " he was tired of Dr Ancil Boozer shit " and that it was " F** " ridiculous that she will not refill her medication and that she is a congestive heart failure patient and hung up the phone.

## 2018-02-01 NOTE — Telephone Encounter (Signed)
Please advise 

## 2018-02-01 NOTE — Telephone Encounter (Deleted)
Copied from [place] [id number]. Topic: Quick Communication - See Telephone Encounter >> Feb 01, 2018  4:28 PM Vernona Rieger wrote: CRM for notification. See Telephone encounter for: 02/01/18.  Patient's son called and said that Dr Ancil Boozer denied her medication " furosemide (LASIX) 40 MG tablet " due to needing an appointment. I advised him that she has canceled or no show the last 7 appointments and has not been in since January and that yes he would need an appointment. He then preceded to tell me that " he was tired of Dr Ancil Boozer shit " and that it was " F***** " ridiculous that she will not refill her medication and that she is a congestive heart failure patient and hung up the phone.

## 2018-02-01 NOTE — Telephone Encounter (Signed)
Do we need to dismiss this patient?

## 2018-02-07 DIAGNOSIS — I509 Heart failure, unspecified: Secondary | ICD-10-CM | POA: Diagnosis not present

## 2018-02-11 ENCOUNTER — Other Ambulatory Visit: Payer: Self-pay | Admitting: Family Medicine

## 2018-02-11 DIAGNOSIS — N939 Abnormal uterine and vaginal bleeding, unspecified: Secondary | ICD-10-CM

## 2018-02-11 NOTE — Telephone Encounter (Signed)
Refill request for general medication. Provera to [facility]  Last office visit 10/23/17  No follow-ups on file.

## 2018-02-12 DIAGNOSIS — D649 Anemia, unspecified: Secondary | ICD-10-CM | POA: Diagnosis not present

## 2018-02-12 DIAGNOSIS — I10 Essential (primary) hypertension: Secondary | ICD-10-CM | POA: Diagnosis not present

## 2018-02-12 DIAGNOSIS — Z794 Long term (current) use of insulin: Secondary | ICD-10-CM | POA: Diagnosis not present

## 2018-02-12 DIAGNOSIS — Z7689 Persons encountering health services in other specified circumstances: Secondary | ICD-10-CM | POA: Diagnosis not present

## 2018-02-12 DIAGNOSIS — E1122 Type 2 diabetes mellitus with diabetic chronic kidney disease: Secondary | ICD-10-CM | POA: Diagnosis not present

## 2018-02-12 DIAGNOSIS — G4733 Obstructive sleep apnea (adult) (pediatric): Secondary | ICD-10-CM | POA: Diagnosis not present

## 2018-02-12 DIAGNOSIS — E1169 Type 2 diabetes mellitus with other specified complication: Secondary | ICD-10-CM | POA: Diagnosis not present

## 2018-03-10 DIAGNOSIS — I509 Heart failure, unspecified: Secondary | ICD-10-CM | POA: Diagnosis not present

## 2018-03-29 DIAGNOSIS — N950 Postmenopausal bleeding: Secondary | ICD-10-CM | POA: Diagnosis not present

## 2018-04-10 DIAGNOSIS — I509 Heart failure, unspecified: Secondary | ICD-10-CM | POA: Diagnosis not present

## 2018-06-25 ENCOUNTER — Emergency Department
Admission: EM | Admit: 2018-06-25 | Discharge: 2018-06-25 | Disposition: A | Discharge status: Home / Self Care | Payer: Medicare Other | Source: Home / Self Care | Attending: Emergency Medicine | Admitting: Emergency Medicine

## 2018-06-25 ENCOUNTER — Emergency Department: Payer: Medicare Other

## 2018-06-25 ENCOUNTER — Other Ambulatory Visit: Payer: Self-pay

## 2018-06-25 DIAGNOSIS — I130 Hypertensive heart and chronic kidney disease with heart failure and stage 1 through stage 4 chronic kidney disease, or unspecified chronic kidney disease: Secondary | ICD-10-CM | POA: Insufficient documentation

## 2018-06-25 DIAGNOSIS — Z87891 Personal history of nicotine dependence: Secondary | ICD-10-CM | POA: Insufficient documentation

## 2018-06-25 DIAGNOSIS — J069 Acute upper respiratory infection, unspecified: Secondary | ICD-10-CM | POA: Insufficient documentation

## 2018-06-25 DIAGNOSIS — J441 Chronic obstructive pulmonary disease with (acute) exacerbation: Secondary | ICD-10-CM

## 2018-06-25 DIAGNOSIS — E1122 Type 2 diabetes mellitus with diabetic chronic kidney disease: Secondary | ICD-10-CM | POA: Insufficient documentation

## 2018-06-25 DIAGNOSIS — R0602 Shortness of breath: Secondary | ICD-10-CM

## 2018-06-25 DIAGNOSIS — N3000 Acute cystitis without hematuria: Secondary | ICD-10-CM | POA: Insufficient documentation

## 2018-06-25 DIAGNOSIS — E1140 Type 2 diabetes mellitus with diabetic neuropathy, unspecified: Secondary | ICD-10-CM | POA: Insufficient documentation

## 2018-06-25 DIAGNOSIS — I509 Heart failure, unspecified: Secondary | ICD-10-CM | POA: Insufficient documentation

## 2018-06-25 DIAGNOSIS — N184 Chronic kidney disease, stage 4 (severe): Secondary | ICD-10-CM | POA: Insufficient documentation

## 2018-06-25 LAB — CBC WITH DIFFERENTIAL/PLATELET
Abs Immature Granulocytes: 0.1 10*3/uL — ABNORMAL HIGH (ref 0.00–0.07)
Basophils Absolute: 0 10*3/uL (ref 0.0–0.1)
Basophils Relative: 0 %
Eosinophils Absolute: 0.1 10*3/uL (ref 0.0–0.5)
Eosinophils Relative: 1 %
HCT: 28.3 % — ABNORMAL LOW (ref 36.0–46.0)
Hemoglobin: 8.1 g/dL — ABNORMAL LOW (ref 12.0–15.0)
Immature Granulocytes: 1 %
Lymphocytes Relative: 8 %
Lymphs Abs: 0.9 10*3/uL (ref 0.7–4.0)
MCH: 22.2 pg — ABNORMAL LOW (ref 26.0–34.0)
MCHC: 28.6 g/dL — ABNORMAL LOW (ref 30.0–36.0)
MCV: 77.5 fL — ABNORMAL LOW (ref 80.0–100.0)
Monocytes Absolute: 0.8 10*3/uL (ref 0.1–1.0)
Monocytes Relative: 7 %
NEUTROS ABS: 9.9 10*3/uL — AB (ref 1.7–7.7)
Neutrophils Relative %: 83 %
PLATELETS: 292 10*3/uL (ref 150–400)
RBC: 3.65 MIL/uL — ABNORMAL LOW (ref 3.87–5.11)
RDW: 16.2 % — ABNORMAL HIGH (ref 11.5–15.5)
WBC: 11.8 10*3/uL — ABNORMAL HIGH (ref 4.0–10.5)
nRBC: 0 % (ref 0.0–0.2)

## 2018-06-25 LAB — COMPREHENSIVE METABOLIC PANEL
ALT: 10 U/L (ref 0–44)
AST: 13 U/L — ABNORMAL LOW (ref 15–41)
Albumin: 3.1 g/dL — ABNORMAL LOW (ref 3.5–5.0)
Alkaline Phosphatase: 60 U/L (ref 38–126)
Anion gap: 6 (ref 5–15)
BUN: 42 mg/dL — ABNORMAL HIGH (ref 8–23)
CO2: 28 mmol/L (ref 22–32)
Calcium: 8.3 mg/dL — ABNORMAL LOW (ref 8.9–10.3)
Chloride: 105 mmol/L (ref 98–111)
Creatinine, Ser: 1.67 mg/dL — ABNORMAL HIGH (ref 0.44–1.00)
GFR calc Af Amer: 35 mL/min — ABNORMAL LOW (ref >60)
GFR calc non Af Amer: 30 mL/min — ABNORMAL LOW (ref >60)
Glucose, Bld: 171 mg/dL — ABNORMAL HIGH (ref 70–99)
POTASSIUM: 4 mmol/L (ref 3.5–5.1)
Sodium: 139 mmol/L (ref 135–145)
TOTAL PROTEIN: 7.2 g/dL (ref 6.5–8.1)
Total Bilirubin: 0.9 mg/dL (ref 0.3–1.2)

## 2018-06-25 LAB — URINALYSIS, ROUTINE W REFLEX MICROSCOPIC
Bilirubin Urine: NEGATIVE
Glucose, UA: NEGATIVE mg/dL
Hgb urine dipstick: NEGATIVE
Ketones, ur: NEGATIVE mg/dL
Nitrite: NEGATIVE
PROTEIN: 100 mg/dL — AB
Specific Gravity, Urine: 1.011 (ref 1.005–1.030)
WBC, UA: >50 WBC/hpf — ABNORMAL HIGH (ref 0–5)
pH: 5 (ref 5.0–8.0)

## 2018-06-25 LAB — BLOOD GAS, VENOUS
Acid-Base Excess: 1.1 mmol/L (ref 0.0–2.0)
Bicarbonate: 28.3 mmol/L — ABNORMAL HIGH (ref 20.0–28.0)
O2 Saturation: 61.5 %
Patient temperature: 37
pCO2, Ven: 55 mmHg (ref 44.0–60.0)
pH, Ven: 7.32 (ref 7.250–7.430)
pO2, Ven: 35 mmHg (ref 32.0–45.0)

## 2018-06-25 LAB — LACTIC ACID, PLASMA: LACTIC ACID, VENOUS: 1.3 mmol/L (ref 0.5–1.9)

## 2018-06-25 LAB — INFLUENZA PANEL BY PCR (TYPE A & B)
INFLAPCR: NEGATIVE
Influenza B By PCR: NEGATIVE

## 2018-06-25 LAB — BRAIN NATRIURETIC PEPTIDE: B NATRIURETIC PEPTIDE 5: 280 pg/mL — AB (ref 0.0–100.0)

## 2018-06-25 LAB — TROPONIN I: Troponin I: <0.03 ng/mL (ref <0.03)

## 2018-06-25 MED ORDER — PIPERACILLIN-TAZOBACTAM 3.375 G IVPB 30 MIN
3.3750 g | Freq: Once | INTRAVENOUS | Status: AC
  Administered 2018-06-25: 3.375 g via INTRAVENOUS
  Filled 2018-06-25: qty 50

## 2018-06-25 MED ORDER — PREDNISONE 20 MG PO TABS: 60.0000 mg | ORAL_TABLET | Freq: Every day | ORAL | 0 refills | Status: DC

## 2018-06-25 MED ORDER — VANCOMYCIN HCL 10 G IV SOLR: 2000.0000 mg | Freq: Once | INTRAVENOUS | Status: DC

## 2018-06-25 MED ORDER — ACETAMINOPHEN 500 MG PO TABS
1000.0000 mg | ORAL_TABLET | Freq: Once | ORAL | Status: AC
  Administered 2018-06-25: 1000 mg via ORAL
  Filled 2018-06-25: qty 2

## 2018-06-25 MED ORDER — DOXYCYCLINE HYCLATE 50 MG PO CAPS: 100.0000 mg | ORAL_CAPSULE | Freq: Two times a day (BID) | ORAL | 0 refills | Status: AC

## 2018-06-25 NOTE — ED Provider Notes (Addendum)
[facility] Emergency Department Provider Note  ____________________________________________  Time seen: Approximately 8:46 PM  I have reviewed the triage vital signs and the nursing notes.   HISTORY  Chief Complaint Shortness of Breath    HPI Veronica Anderson is a 72 y.o. female with morbid obesity, COPD and asthma on 3 L nasal cannula at baseline, CAD status post MI, CHF, HTN, HL, DM, presenting for shortness of breath.  The patient reports that for the last several weeks she has had a progressively worsening shortness of breath.  She has also been experiencing a cough productive of thick phlegm.  Over the last few days she has also developed a clear rhinorrhea and worsening shortness of breath.  She reports subjective fevers at home.  She has not had any nausea vomiting or diarrhea, abdominal pain, lower extremity swelling or calf pain.  Past Medical History:  Diagnosis Date  . Allergy   . Anemia   . Asthma   . Cataract    bilateral  . CHF (NYHA class II, ACC/AHA stage C) ([place])   . Chronic kidney disease    stage IV (severe), Dr. Aleene Davidson  . Constipation   . Diabetes mellitus without complication ([place])   . Edema    feet/ankles  . GERD (gastroesophageal reflux disease)   . Hyperlipidemia   . Hypertension   . Left ankle pain   . Lumbago   . Myocardial infarction ([place])   . Neuropathy   . Neuropathy   . OA (osteoarthritis)   . Obesity   . Obstructive sleep apnea syndrome    CPAP  . Paresthesia    Foot  . Protein calorie malnutrition ([place])   . Requires supplemental oxygen   . Stroke ([place])   . Vitamin D deficiency     Patient Active Problem List   Diagnosis Date Noted  . Post-menopausal bleeding 11/18/2017  . UTI (urinary tract infection) 09/11/2017  . Pressure injury of skin 09/11/2017  . Chronic kidney disease (CKD), stage IV (severe) ([place]) 07/29/2017  . Mild protein-calorie malnutrition ([place]) 07/29/2017  . Anemia due to chronic kidney disease    . Prolonged Q-T interval on ECG 12/23/2016  . CHF (congestive heart failure), NYHA class I ([place]) 11/07/2016  . Nocturnal oxygen desaturation 06/26/2015  . Anemia in chronic illness 02/23/2015  . Asthma, mild intermittent 02/23/2015  . Benign essential HTN 02/23/2015  . Diastolic heart failure, NYHA class 3 ([place]) 02/23/2015  . Benign hypertension with chronic kidney disease, stage III ([place]) 02/23/2015  . CN (constipation) 02/23/2015  . Diabetes mellitus with neuropathy ([place]) 02/23/2015  . Dyslipidemia 02/23/2015  . Elevated ferritin 02/23/2015  . Gastro-esophageal reflux disease without esophagitis 02/23/2015  . LBP (low back pain) 02/23/2015  . Morbid obesity due to excess calories ([place]) 02/23/2015  . Obstructive apnea 02/23/2015  . Osteoarthritis 02/23/2015  . Neuropathy 02/23/2015  . Perennial allergic rhinitis with seasonal variation 02/23/2015  . History of pneumonia 02/23/2015  . Vitamin D deficiency 02/23/2015  . Cataract 04/16/2010  . Background diabetic retinopathy ([place]) 12/18/2009    Past Surgical History:  Procedure Laterality Date  . ANKLE SURGERY Left   . CATARACT EXTRACTION W/PHACO Right 12/20/2015   Procedure: CATARACT EXTRACTION PHACO AND INTRAOCULAR LENS PLACEMENT (IOC);  Surgeon: Eulogio Bear, MD;  Location: [facility] ORS;  Service: Ophthalmology;  Laterality: Right;  [place] 2.50 AP% 21.8 CDE 37.04 Fluid pack lot # [id number]  . CATARACT EXTRACTION W/PHACO Left 02/14/2016   Procedure: CATARACT EXTRACTION PHACO AND INTRAOCULAR  LENS PLACEMENT (IOC);  Surgeon: Eulogio Bear, MD;  Location: [facility] ORS;  Service: Ophthalmology;  Laterality: Left;  [place] 3.12 AP% 42.1 CDE 47.12 Fluid Pack lot # [id number]  . COLONOSCOPY    . FRACTURE SURGERY Left    ankle    Current Outpatient Rx  . Order #: [id number] Class: Normal  . Order #: [id number] Class: Historical Med  . Order #: [id number] Class: Normal  . Order #: [id number] Class: Normal  . Order #: [id number] Class: Print  . Order  #: [id number] Class: Normal  . Order #: [id number] Class: Normal  . Order #: [id number] Class: Normal  . Order #: [id number] Class: Normal  . Order #: [id number] Class: Normal  . Order #: [id number] Class: Normal  . Order #: [id number] Class: Normal  . Order #: [id number] Class: Normal  . Order #: [id number] Class: Normal  . Order #: [id number] Class: Normal  . Order #: [id number] Class: Normal  . Order #: [id number] Class: Normal  . Order #: [id number] Class: Print  . Order #: [id number] Class: Normal  . Order #: [id number] Class: Normal  . Order #: [id number] Class: Normal    Allergies Ace inhibitors  Family History  Problem Relation Age of Onset  . Diabetes type II Other     Social History Social History   Tobacco Use  . Smoking status: Former Smoker    Types: Cigarettes    Last attempt to quit: 07/14/1994    Years since quitting: 23.9  . Smokeless tobacco: Never Used  Substance Use Topics  . Alcohol use: No    Alcohol/week: 0.0 standard drinks  . Drug use: No    Review of Systems Constitutional: Positive subjective fever/chills.  No lightheadedness or syncope. Eyes: No visual changes. ENT: No sore throat.  Positive congestion with clear rhinorrhea.  No ear pain. Cardiovascular: Denies chest pain. Denies palpitations. Respiratory: Positive shortness of breath.  Positive cough. Gastrointestinal: No abdominal pain.  No nausea, no vomiting.  No diarrhea.  No constipation. Genitourinary: Negative for dysuria. Musculoskeletal: Negative for back pain.  No lower extremity swelling or calf pain. Skin: Negative for rash. Neurological: Negative for headaches. No focal numbness, tingling or weakness.     ____________________________________________   PHYSICAL EXAM:  VITAL SIGNS: ED Triage Vitals  Enc Vitals Group     BP --      Pulse Rate 06/25/18 2010 63     Resp --      Temp 06/25/18 2010 100 F (37.8 C)     Temp Source 06/25/18 2010 Oral     SpO2 --      Weight --      Height --       Head Circumference --      Peak Flow --      Pain Score 06/25/18 2012 0     Pain Loc --      Pain Edu? --      Excl. in [place]? --     Constitutional: Alert and oriented.  Answers questions appropriately.  Chronically ill-appearing; mentating normally. Eyes: Conjunctivae are normal.  EOMI. No scleral icterus.  Positive bilateral eye discharge. Head: Atraumatic. Nose: Positive active clear rhinorrhea. Mouth/Throat: Mucous membranes are moist.  Neck: No stridor.  Supple.  Positive JVD.  No meningismus. Cardiovascular: Slow rate, regular rhythm. No murmurs, rubs or gallops.  Respiratory: The patient has mild tachypnea without accessory muscle use or retractions.  She is able to speak in full sentences without difficulty.  She has distant heart sounds due to her body habitus.  I do not hear any wheezes rales or  rhonchi.  The patient does have a prolonged expiratory phase. Gastrointestinal: Morbidly obese.  Soft, nontender and nondistended.  No guarding or rebound.  No peritoneal signs. Musculoskeletal: No LE edema. No ttp in the calves or palpable cords.  Negative Homan's sign. Neurologic:  A&Ox3.  Speech is clear.  Face and smile are symmetric.  EOMI.  Moves all extremities well. Skin:  Skin is warm, dry and intact. No rash noted. Psychiatric: Mood and affect are normal.   ____________________________________________   LABS (all labs ordered are listed, but only abnormal results are displayed)  Labs Reviewed  COMPREHENSIVE METABOLIC PANEL - Abnormal; Notable for the following components:      Result Value   Glucose, Bld 171 (*)    BUN 42 (*)    Creatinine, Ser 1.67 (*)    Calcium 8.3 (*)    Albumin 3.1 (*)    AST 13 (*)    GFR calc non Af Amer 30 (*)    GFR calc Af Amer 35 (*)    All other components within normal limits  CBC WITH DIFFERENTIAL/PLATELET - Abnormal; Notable for the following components:   WBC 11.8 (*)    RBC 3.65 (*)    Hemoglobin 8.1 (*)    HCT 28.3 (*)    MCV  77.5 (*)    MCH 22.2 (*)    MCHC 28.6 (*)    RDW 16.2 (*)    Neutro Abs 9.9 (*)    Abs Immature Granulocytes 0.10 (*)    All other components within normal limits  BLOOD GAS, VENOUS - Abnormal; Notable for the following components:   Bicarbonate 28.3 (*)    All other components within normal limits  BRAIN NATRIURETIC PEPTIDE - Abnormal; Notable for the following components:   B Natriuretic Peptide 280.0 (*)    All other components within normal limits  CULTURE, BLOOD (ROUTINE X 2)  CULTURE, BLOOD (ROUTINE X 2)  LACTIC ACID, PLASMA  TROPONIN I  INFLUENZA PANEL BY PCR (TYPE A & B)  URINALYSIS, ROUTINE W REFLEX MICROSCOPIC  LACTIC ACID, PLASMA   ____________________________________________  EKG  ED ECG REPORT I, Anne-Caroline Mariea Clonts, the attending physician, personally viewed and interpreted this ECG.   Date: 06/25/2018  EKG Time: 2051  Rate: 60  Rhythm: normal sinus rhythm  Axis: leftward  Intervals:none  ST&T Change: No STEMI  ____________________________________________  RADIOLOGY  Dg Chest Port 1 View  Result Date: 06/25/2018 CLINICAL DATA:  Shortness of breath today. EXAM: PORTABLE CHEST 1 VIEW COMPARISON:  Single-view of the chest 05/01/2017. CT chest 04/24/2017 FINDINGS: The lungs are clear. Heart size is upper normal. No pneumothorax or pleural fluid. No acute or focal bony abnormality. IMPRESSION: No acute disease. Electronically Signed   By: Inge Rise M.D.   On: 06/25/2018 21:18    ____________________________________________   PROCEDURES  Procedure(s) performed: None  Procedures  Critical Care performed: No ____________________________________________   INITIAL IMPRESSION / ASSESSMENT AND PLAN / ED COURSE  Pertinent labs & imaging results that were available during my care of the patient were reviewed by me and considered in my medical decision making (see chart for details).  72 y.o. female with COPD and asthma, chronic comorbidities  presenting with several weeks of cough, now with increasing shortness of breath.  Overall, the patient does have a temperature of 100.0, but is able to maintain normal oxygen saturations on her 3 L nasal cannula.  On my exam, the patient is mentating normally.  I will evaluate her for  COPD exacerbation versus pneumonia and cover her with antibiotics after obtaining blood cultures.  Plan reevaluation for final disposition.  ----------------------------------------- 9:48 PM on 06/25/2018 ----------------------------------------- Patient's work-up in the emergency department has been reassuring.  She has remained hemodynamically stable and is maintaining normal oxygen saturations on her home O2.  Her blood gas shows a pH of 7.32 with a PCO2 of 55 and a PO2 of 35.  Her lactic acid is normal.  She is negative for flu and does not have any evidence of pneumonia on her chest x-ray.  It is possible that she has a URI that is exacerbating her COPD, so we will place her on a 5-day course of prednisone.  She will receive a 7-day course of doxycycline.  Today, she has a baseline renal insufficiency and unchanged anemia.  Her white blood cell count is minimally elevated 11.8, although she chronically has significantly elevated white blood cell counts.  From a cardiac standpoint, the patient has no evidence of ischemia or arrhythmia and her BNP is 280, which is within her baseline.  Her troponin today is negative.  At this time, we will plan to discharge the patient home.  I have given her specific return instructions, and she will follow-up with her primary care physician on Monday.  ----------------------------------------- 9:54 PM on 06/25/2018 -----------------------------------------  Urinalysis has not yet been obtained; I will wait for that prior to discharge as her sons are here and reporting that she had a brief period where she was mildly confused, which has completely resolved.  At this time he states she is  back to her normal mental status.   ____________________________________________  FINAL CLINICAL IMPRESSION(S) / ED DIAGNOSES  Final diagnoses:  Shortness of breath  COPD exacerbation (HCC)  Upper respiratory tract infection, unspecified type         NEW MEDICATIONS STARTED DURING THIS VISIT:  New Prescriptions   DOXYCYCLINE (VIBRAMYCIN) 50 MG CAPSULE    Take 2 capsules (100 mg total) by mouth 2 (two) times daily for 7 days.   PREDNISONE (DELTASONE) 20 MG TABLET    Take 3 tablets (60 mg total) by mouth daily.      Eula Listen, MD 06/25/18 2151    Eula Listen, MD 06/25/18 2154

## 2018-06-25 NOTE — Discharge Instructions (Addendum)
You may use Tylenol for fever pain; avoid NSAID medication such as Motrin, ibuprofen, Advil or Aleve as these may worsen your kidney function.  Please make sure you drink plenty of fluids to stay well-hydrated.  Please take the entire course of steroids and antibiotics, even if you are feeling better.  Make a follow-up appoint with your primary care physician on Monday.  Turn to the emergency department if you develop severe pain, lightheadedness or fainting, worsening shortness of breath, inability to keep down fluids, or any other symptoms concerning to you.

## 2018-06-25 NOTE — ED Notes (Signed)
Report from jeanette, rn.  

## 2018-06-25 NOTE — ED Notes (Signed)
Vancomycin not given, was not approved by pharmacy. Flu negative. Temp down to 98.6 oral. Vss. Awaiting further plan of care.

## 2018-06-25 NOTE — ED Notes (Signed)
Urine specimen sent patient straight cath for the specimen.

## 2018-06-25 NOTE — ED Triage Notes (Signed)
Reports feeling sob today. Patient 02 dependant. Family called ems with with concerns of patient having lethargy.

## 2018-06-25 NOTE — ED Notes (Signed)
Discharge instructions reviewed with pt and family. Family verbalizes understanding.

## 2018-06-28 ENCOUNTER — Emergency Department: Payer: Medicare Other

## 2018-06-28 ENCOUNTER — Other Ambulatory Visit: Payer: Self-pay

## 2018-06-28 ENCOUNTER — Inpatient Hospital Stay
Admission: EM | Admit: 2018-06-28 | Discharge: 2018-06-30 | DRG: 291 | Disposition: A | Discharge status: Home Health | Payer: Medicare Other | Attending: Internal Medicine | Admitting: Internal Medicine

## 2018-06-28 ENCOUNTER — Encounter: Payer: Self-pay | Admitting: Emergency Medicine

## 2018-06-28 DIAGNOSIS — Z87891 Personal history of nicotine dependence: Secondary | ICD-10-CM

## 2018-06-28 DIAGNOSIS — J9621 Acute and chronic respiratory failure with hypoxia: Secondary | ICD-10-CM | POA: Diagnosis present

## 2018-06-28 DIAGNOSIS — T380X5A Adverse effect of glucocorticoids and synthetic analogues, initial encounter: Secondary | ICD-10-CM | POA: Diagnosis present

## 2018-06-28 DIAGNOSIS — J441 Chronic obstructive pulmonary disease with (acute) exacerbation: Secondary | ICD-10-CM | POA: Diagnosis present

## 2018-06-28 DIAGNOSIS — G4733 Obstructive sleep apnea (adult) (pediatric): Secondary | ICD-10-CM | POA: Diagnosis present

## 2018-06-28 DIAGNOSIS — N184 Chronic kidney disease, stage 4 (severe): Secondary | ICD-10-CM | POA: Diagnosis present

## 2018-06-28 DIAGNOSIS — I130 Hypertensive heart and chronic kidney disease with heart failure and stage 1 through stage 4 chronic kidney disease, or unspecified chronic kidney disease: Secondary | ICD-10-CM | POA: Diagnosis present

## 2018-06-28 DIAGNOSIS — E785 Hyperlipidemia, unspecified: Secondary | ICD-10-CM | POA: Diagnosis present

## 2018-06-28 DIAGNOSIS — J9622 Acute and chronic respiratory failure with hypercapnia: Secondary | ICD-10-CM | POA: Diagnosis present

## 2018-06-28 DIAGNOSIS — K219 Gastro-esophageal reflux disease without esophagitis: Secondary | ICD-10-CM | POA: Diagnosis present

## 2018-06-28 DIAGNOSIS — Z794 Long term (current) use of insulin: Secondary | ICD-10-CM | POA: Diagnosis not present

## 2018-06-28 DIAGNOSIS — E1165 Type 2 diabetes mellitus with hyperglycemia: Secondary | ICD-10-CM | POA: Diagnosis present

## 2018-06-28 DIAGNOSIS — J9601 Acute respiratory failure with hypoxia: Secondary | ICD-10-CM | POA: Diagnosis not present

## 2018-06-28 DIAGNOSIS — Z8673 Personal history of transient ischemic attack (TIA), and cerebral infarction without residual deficits: Secondary | ICD-10-CM

## 2018-06-28 DIAGNOSIS — E1122 Type 2 diabetes mellitus with diabetic chronic kidney disease: Secondary | ICD-10-CM | POA: Diagnosis present

## 2018-06-28 DIAGNOSIS — Z888 Allergy status to other drugs, medicaments and biological substances status: Secondary | ICD-10-CM | POA: Diagnosis not present

## 2018-06-28 DIAGNOSIS — Z7982 Long term (current) use of aspirin: Secondary | ICD-10-CM

## 2018-06-28 DIAGNOSIS — Z9981 Dependence on supplemental oxygen: Secondary | ICD-10-CM | POA: Diagnosis not present

## 2018-06-28 DIAGNOSIS — I252 Old myocardial infarction: Secondary | ICD-10-CM

## 2018-06-28 DIAGNOSIS — Z6841 Body Mass Index (BMI) 40.0 and over, adult: Secondary | ICD-10-CM | POA: Diagnosis not present

## 2018-06-28 DIAGNOSIS — N170 Acute kidney failure with tubular necrosis: Secondary | ICD-10-CM | POA: Diagnosis present

## 2018-06-28 DIAGNOSIS — I5033 Acute on chronic diastolic (congestive) heart failure: Secondary | ICD-10-CM | POA: Diagnosis present

## 2018-06-28 DIAGNOSIS — Z79899 Other long term (current) drug therapy: Secondary | ICD-10-CM

## 2018-06-28 DIAGNOSIS — J4520 Mild intermittent asthma, uncomplicated: Secondary | ICD-10-CM | POA: Diagnosis present

## 2018-06-28 LAB — BLOOD GAS, ARTERIAL
Acid-Base Excess: 3.7 mmol/L — ABNORMAL HIGH (ref 0.0–2.0)
Bicarbonate: 29.7 mmol/L — ABNORMAL HIGH (ref 20.0–28.0)
FIO2: 0.36
O2 Saturation: 81.2 %
Patient temperature: 37
pCO2 arterial: 49 mmHg — ABNORMAL HIGH (ref 32.0–48.0)
pH, Arterial: 7.39 (ref 7.350–7.450)
pO2, Arterial: 46 mmHg — ABNORMAL LOW (ref 83.0–108.0)

## 2018-06-28 LAB — HEMOGLOBIN A1C
Hgb A1c MFr Bld: 5.7 % — ABNORMAL HIGH (ref 4.8–5.6)
Mean Plasma Glucose: 116.89 mg/dL

## 2018-06-28 LAB — COMPREHENSIVE METABOLIC PANEL
ALT: 6 U/L (ref 0–44)
AST: 7 U/L — ABNORMAL LOW (ref 15–41)
Albumin: 3.2 g/dL — ABNORMAL LOW (ref 3.5–5.0)
Alkaline Phosphatase: 54 U/L (ref 38–126)
Anion gap: 8 (ref 5–15)
BUN: 48 mg/dL — ABNORMAL HIGH (ref 8–23)
CO2: 28 mmol/L (ref 22–32)
Calcium: 8.7 mg/dL — ABNORMAL LOW (ref 8.9–10.3)
Chloride: 105 mmol/L (ref 98–111)
Creatinine, Ser: 1.82 mg/dL — ABNORMAL HIGH (ref 0.44–1.00)
GFR calc Af Amer: 32 mL/min — ABNORMAL LOW (ref >60)
GFR calc non Af Amer: 27 mL/min — ABNORMAL LOW (ref >60)
GLUCOSE: 241 mg/dL — AB (ref 70–99)
Potassium: 3.9 mmol/L (ref 3.5–5.1)
Sodium: 141 mmol/L (ref 135–145)
Total Bilirubin: 0.8 mg/dL (ref 0.3–1.2)
Total Protein: 7.6 g/dL (ref 6.5–8.1)

## 2018-06-28 LAB — CBC WITH DIFFERENTIAL/PLATELET
ABS IMMATURE GRANULOCYTES: 0.13 10*3/uL — AB (ref 0.00–0.07)
Basophils Absolute: 0 10*3/uL (ref 0.0–0.1)
Basophils Relative: 0 %
Eosinophils Absolute: 0.1 10*3/uL (ref 0.0–0.5)
Eosinophils Relative: 1 %
HCT: 26.9 % — ABNORMAL LOW (ref 36.0–46.0)
HEMOGLOBIN: 7.7 g/dL — AB (ref 12.0–15.0)
Immature Granulocytes: 1 %
LYMPHS PCT: 6 %
Lymphs Abs: 0.8 10*3/uL (ref 0.7–4.0)
MCH: 22 pg — ABNORMAL LOW (ref 26.0–34.0)
MCHC: 28.6 g/dL — ABNORMAL LOW (ref 30.0–36.0)
MCV: 76.9 fL — ABNORMAL LOW (ref 80.0–100.0)
Monocytes Absolute: 0.8 10*3/uL (ref 0.1–1.0)
Monocytes Relative: 6 %
Neutro Abs: 12.1 10*3/uL — ABNORMAL HIGH (ref 1.7–7.7)
Neutrophils Relative %: 86 %
Platelets: 336 10*3/uL (ref 150–400)
RBC: 3.5 MIL/uL — ABNORMAL LOW (ref 3.87–5.11)
RDW: 16.1 % — ABNORMAL HIGH (ref 11.5–15.5)
WBC: 14 10*3/uL — ABNORMAL HIGH (ref 4.0–10.5)
nRBC: 0 % (ref 0.0–0.2)

## 2018-06-28 LAB — INFLUENZA PANEL BY PCR (TYPE A & B)
Influenza A By PCR: NEGATIVE
Influenza B By PCR: NEGATIVE

## 2018-06-28 LAB — TROPONIN I: Troponin I: <0.03 ng/mL (ref <0.03)

## 2018-06-28 LAB — MRSA PCR SCREENING: MRSA by PCR: NEGATIVE

## 2018-06-28 LAB — PROCALCITONIN: Procalcitonin: 0.17 ng/mL

## 2018-06-28 LAB — BRAIN NATRIURETIC PEPTIDE: B Natriuretic Peptide: 266 pg/mL — ABNORMAL HIGH (ref 0.0–100.0)

## 2018-06-28 LAB — GLUCOSE, CAPILLARY
Glucose-Capillary: 207 mg/dL — ABNORMAL HIGH (ref 70–99)
Glucose-Capillary: 224 mg/dL — ABNORMAL HIGH (ref 70–99)
Glucose-Capillary: 275 mg/dL — ABNORMAL HIGH (ref 70–99)
Glucose-Capillary: 341 mg/dL — ABNORMAL HIGH (ref 70–99)
Glucose-Capillary: 281 mg/dL — ABNORMAL HIGH (ref 70–99)

## 2018-06-28 LAB — TSH: TSH: 1.099 u[IU]/mL (ref 0.350–4.500)

## 2018-06-28 MED ORDER — SODIUM CHLORIDE 0.9 % IV SOLN
1.0000 g | Freq: Once | INTRAVENOUS | Status: AC
  Administered 2018-06-28: 1 g via INTRAVENOUS
  Filled 2018-06-28: qty 10

## 2018-06-28 MED ORDER — CLONIDINE HCL 0.3 MG/24HR TD PTWK: 0.3000 mg | MEDICATED_PATCH | TRANSDERMAL | Status: DC

## 2018-06-28 MED ORDER — DOCUSATE SODIUM 100 MG PO CAPS
100.0000 mg | ORAL_CAPSULE | Freq: Two times a day (BID) | ORAL | Status: DC
  Administered 2018-06-28 – 2018-06-30 (×5): 100 mg via ORAL
  Filled 2018-06-28 (×5): qty 1

## 2018-06-28 MED ORDER — FUROSEMIDE 10 MG/ML IJ SOLN
40.0000 mg | Freq: Once | INTRAMUSCULAR | Status: AC
  Administered 2018-06-28: 40 mg via INTRAVENOUS
  Filled 2018-06-28: qty 4

## 2018-06-28 MED ORDER — INSULIN ASPART 100 UNIT/ML ~~LOC~~ SOLN
0.0000 [IU] | Freq: Every day | SUBCUTANEOUS | Status: DC
  Administered 2018-06-28: 23:00:00 3 [IU] via SUBCUTANEOUS
  Administered 2018-06-29: 2 [IU] via SUBCUTANEOUS
  Filled 2018-06-28 (×2): qty 1

## 2018-06-28 MED ORDER — MEDROXYPROGESTERONE ACETATE 10 MG PO TABS
20.0000 mg | ORAL_TABLET | Freq: Every day | ORAL | Status: DC
  Administered 2018-06-28 – 2018-06-30 (×3): 20 mg via ORAL
  Filled 2018-06-28 (×3): qty 2

## 2018-06-28 MED ORDER — FERROUS SULFATE 325 (65 FE) MG PO TABS
325.0000 mg | ORAL_TABLET | Freq: Two times a day (BID) | ORAL | Status: DC
  Administered 2018-06-28 – 2018-06-30 (×5): 325 mg via ORAL
  Filled 2018-06-28 (×5): qty 1

## 2018-06-28 MED ORDER — IPRATROPIUM-ALBUTEROL 0.5-2.5 (3) MG/3ML IN SOLN
3.0000 mL | Freq: Once | RESPIRATORY_TRACT | Status: AC
  Administered 2018-06-28: 3 mL via RESPIRATORY_TRACT
  Filled 2018-06-28: qty 3

## 2018-06-28 MED ORDER — FUROSEMIDE 40 MG PO TABS
40.0000 mg | ORAL_TABLET | Freq: Every day | ORAL | Status: DC
  Administered 2018-06-28 – 2018-06-30 (×3): 40 mg via ORAL
  Filled 2018-06-28 (×2): qty 1
  Filled 2018-06-28: qty 2

## 2018-06-28 MED ORDER — AMLODIPINE BESYLATE 10 MG PO TABS
10.0000 mg | ORAL_TABLET | Freq: Every day | ORAL | Status: DC
  Administered 2018-06-28 – 2018-06-30 (×3): 10 mg via ORAL
  Filled 2018-06-28 (×3): qty 1

## 2018-06-28 MED ORDER — SODIUM CHLORIDE 0.9 % IV BOLUS
500.0000 mL | Freq: Once | INTRAVENOUS | Status: AC
  Administered 2018-06-28: 500 mL via INTRAVENOUS

## 2018-06-28 MED ORDER — ACETAMINOPHEN 650 MG RE SUPP: 650.0000 mg | Freq: Four times a day (QID) | RECTAL | Status: DC | PRN

## 2018-06-28 MED ORDER — MAGNESIUM SULFATE 2 GM/50ML IV SOLN
2.0000 g | Freq: Once | INTRAVENOUS | Status: AC
  Administered 2018-06-28: 2 g via INTRAVENOUS
  Filled 2018-06-28: qty 50

## 2018-06-28 MED ORDER — PREDNISONE 20 MG PO TABS: 60.0000 mg | ORAL_TABLET | Freq: Every day | ORAL | Status: DC

## 2018-06-28 MED ORDER — MONTELUKAST SODIUM 10 MG PO TABS: 10.0000 mg | ORAL_TABLET | Freq: Every day | ORAL | Status: DC

## 2018-06-28 MED ORDER — CHLORHEXIDINE GLUCONATE 0.12 % MT SOLN
15.0000 mL | Freq: Two times a day (BID) | OROMUCOSAL | Status: DC
  Administered 2018-06-28 – 2018-06-30 (×5): 15 mL via OROMUCOSAL
  Filled 2018-06-28 (×5): qty 15

## 2018-06-28 MED ORDER — ORAL CARE MOUTH RINSE
15.0000 mL | Freq: Two times a day (BID) | OROMUCOSAL | Status: DC
  Administered 2018-06-28 – 2018-06-30 (×4): 15 mL via OROMUCOSAL

## 2018-06-28 MED ORDER — BUDESONIDE 0.25 MG/2ML IN SUSP
0.2500 mg | Freq: Two times a day (BID) | RESPIRATORY_TRACT | Status: DC
  Administered 2018-06-28 – 2018-06-30 (×4): 0.25 mg via RESPIRATORY_TRACT
  Filled 2018-06-28 (×4): qty 2

## 2018-06-28 MED ORDER — INSULIN ASPART 100 UNIT/ML ~~LOC~~ SOLN
0.0000 [IU] | Freq: Three times a day (TID) | SUBCUTANEOUS | Status: DC
  Administered 2018-06-28: 18:00:00 15 [IU] via SUBCUTANEOUS
  Administered 2018-06-28: 11 [IU] via SUBCUTANEOUS
  Administered 2018-06-28: 7 [IU] via SUBCUTANEOUS
  Administered 2018-06-29: 11 [IU] via SUBCUTANEOUS
  Administered 2018-06-29: 4 [IU] via SUBCUTANEOUS
  Administered 2018-06-29 – 2018-06-30 (×2): 11 [IU] via SUBCUTANEOUS
  Administered 2018-06-30: 7 [IU] via SUBCUTANEOUS
  Filled 2018-06-28 (×8): qty 1

## 2018-06-28 MED ORDER — ACETAMINOPHEN 325 MG PO TABS: 650.0000 mg | ORAL_TABLET | Freq: Four times a day (QID) | ORAL | Status: DC | PRN

## 2018-06-28 MED ORDER — IPRATROPIUM-ALBUTEROL 0.5-2.5 (3) MG/3ML IN SOLN
3.0000 mL | Freq: Four times a day (QID) | RESPIRATORY_TRACT | Status: DC
  Administered 2018-06-28 – 2018-06-29 (×5): 3 mL via RESPIRATORY_TRACT
  Filled 2018-06-28 (×5): qty 3

## 2018-06-28 MED ORDER — NYSTATIN 100000 UNIT/GM EX POWD
1.0000 g | Freq: Three times a day (TID) | CUTANEOUS | Status: DC
  Administered 2018-06-28 – 2018-06-30 (×7): 1 g via TOPICAL
  Filled 2018-06-28: qty 15

## 2018-06-28 MED ORDER — METHYLPREDNISOLONE SODIUM SUCC 125 MG IJ SOLR
125.0000 mg | Freq: Once | INTRAMUSCULAR | Status: AC
  Administered 2018-06-28: 125 mg via INTRAVENOUS
  Filled 2018-06-28: qty 2

## 2018-06-28 MED ORDER — METHYLPREDNISOLONE SODIUM SUCC 125 MG IJ SOLR
60.0000 mg | Freq: Two times a day (BID) | INTRAMUSCULAR | Status: DC
  Administered 2018-06-28: 60 mg via INTRAVENOUS
  Filled 2018-06-28: qty 2

## 2018-06-28 MED ORDER — FAMOTIDINE 20 MG PO TABS
20.0000 mg | ORAL_TABLET | Freq: Two times a day (BID) | ORAL | Status: DC
  Administered 2018-06-28 – 2018-06-30 (×5): 20 mg via ORAL
  Filled 2018-06-28 (×5): qty 1

## 2018-06-28 MED ORDER — GABAPENTIN 300 MG PO CAPS
300.0000 mg | ORAL_CAPSULE | Freq: Three times a day (TID) | ORAL | Status: DC
  Administered 2018-06-28 – 2018-06-30 (×7): 300 mg via ORAL
  Filled 2018-06-28 (×7): qty 1

## 2018-06-28 MED ORDER — PRAVASTATIN SODIUM 20 MG PO TABS
40.0000 mg | ORAL_TABLET | Freq: Every day | ORAL | Status: DC
  Administered 2018-06-28 – 2018-06-30 (×3): 40 mg via ORAL
  Filled 2018-06-28 (×3): qty 2

## 2018-06-28 MED ORDER — HEPARIN SODIUM (PORCINE) 5000 UNIT/ML IJ SOLN
5000.0000 [IU] | Freq: Three times a day (TID) | INTRAMUSCULAR | Status: DC
  Administered 2018-06-28 – 2018-06-30 (×7): 5000 [IU] via SUBCUTANEOUS
  Filled 2018-06-28 (×7): qty 1

## 2018-06-28 MED ORDER — INSULIN GLARGINE 100 UNIT/ML ~~LOC~~ SOLN
21.0000 [IU] | Freq: Every day | SUBCUTANEOUS | Status: DC
  Administered 2018-06-28: 21 [IU] via SUBCUTANEOUS
  Filled 2018-06-28 (×3): qty 0.21

## 2018-06-28 MED ORDER — ASPIRIN EC 81 MG PO TBEC
81.0000 mg | DELAYED_RELEASE_TABLET | Freq: Every day | ORAL | Status: DC
  Administered 2018-06-28 – 2018-06-29 (×2): 81 mg via ORAL
  Filled 2018-06-28 (×2): qty 1

## 2018-06-28 MED ORDER — BUDESONIDE 0.25 MG/2ML IN SUSP
0.2500 mg | Freq: Two times a day (BID) | RESPIRATORY_TRACT | Status: DC
  Administered 2018-06-28: 0.25 mg via RESPIRATORY_TRACT
  Filled 2018-06-28: qty 2

## 2018-06-28 MED ORDER — SODIUM CHLORIDE 0.9 % IV SOLN
500.0000 mg | Freq: Once | INTRAVENOUS | Status: AC
  Administered 2018-06-28: 500 mg via INTRAVENOUS
  Filled 2018-06-28: qty 500

## 2018-06-28 NOTE — ED Triage Notes (Signed)
Patient presents to Emergency Department via EMS with complaints of SOB.  EMS found pt on 2 lpm [place] (normally at this) at 94%, in [facility] on 12.13 for URI given doxy and pred, hx of UTI, CHF, DM, HTN, and stage 4 kidney, pt does make urine.

## 2018-06-28 NOTE — Progress Notes (Signed)
Patient transported from ED to ICU room without incident. Report given to ICU RT.

## 2018-06-28 NOTE — Progress Notes (Signed)
Pt taken off bipap and placed on 3 lpm [place], sats 99%, respiratory rate 18/min, tolerating well at this time. Will continue to monitor.

## 2018-06-28 NOTE — Care Management Note (Signed)
Case Management Note  Patient Details  Name: Veronica Anderson MRN: [id number] Date of Birth: Mar 08, 1946  Subjective/Objective:     Patient is admitted for acute respiratory failure with hypoxemia, patient has a history of CHF and COPD.  Chronic O2 at home 2L, company [facility].  Patient lives at home with her 2 sons and a daughter in [place]. Oldest son Veronica Anderson is at the bedside.  Patient is receiving in home care from from a personal care services agency, cannot remember the name.  Veronica Anderson reports that patient requires assistance at home with bathing, dressing, walking.  He reports that she is able to stand and pivot but that is about it.  She has a walker, a wheelchair, and a bedside commode.  Patient does not have a shower chair, she gets bed baths, and she does not have a hospital bed but the family is interested in getting a hospital bed because the patient needs to sleep elevated and spends most of her time in bed.  PCP verified as Dr. Ginette Pitman, her son provides transportation, she can stand and pivot to the wheelchair and then stand and pivot to get into the car.  RNCM will cont to follow to assess for discharge planning needs, currently in the ICU but waiting to be transferred to a floor bed.  Doran Clay RN BSN [phone]                Action/Plan:   Expected Discharge Date:                  Expected Discharge Plan:     In-House Referral:     Discharge planning Services     Post Acute Care Choice:    Choice offered to:     DME Arranged:    DME Agency:     HH Arranged:    HH Agency:     Status of Service:     If discussed at [place] of Stay Meetings, dates discussed:    Additional Comments:  Shelbie Hutching, RN 06/28/2018, 4:25 PM

## 2018-06-28 NOTE — H&P (Signed)
Veronica Anderson is an 72 y.o. female.   Chief Complaint: Respiratory distress HPI: The patient with past medical history of COPD, OSA, hypertension, diastolic CHF, diabetes and hyperlipidemia presents to the emergency with shortness of breath.  The patient's family states that she has appeared more labored with her breathing throughout the day.  Seen in the emergency department 2 days ago and diagnosed with COPD exacerbation.  She has been taking doxycycline as well as her prednisone but has not improved.  Due to work of breathing as well as hypoxemia in the emergency department staff initiated BiPAP.  Once patient was stabilized to call the hospitalist service for admission.  Past Medical History:  Diagnosis Date  . Allergy   . Anemia   . Asthma   . Cataract    bilateral  . CHF (NYHA class II, ACC/AHA stage C) ([place])   . Chronic kidney disease    stage IV (severe), Dr. Aleene Davidson  . Constipation   . Diabetes mellitus without complication ([place])   . Edema    feet/ankles  . GERD (gastroesophageal reflux disease)   . Hyperlipidemia   . Hypertension   . Left ankle pain   . Lumbago   . Myocardial infarction ([place])   . Neuropathy   . Neuropathy   . OA (osteoarthritis)   . Obesity   . Obstructive sleep apnea syndrome    CPAP  . Paresthesia    Foot  . Protein calorie malnutrition ([place])   . Requires supplemental oxygen   . Stroke ([place])   . Vitamin D deficiency     Past Surgical History:  Procedure Laterality Date  . ANKLE SURGERY Left   . CATARACT EXTRACTION W/PHACO Right 12/20/2015   Procedure: CATARACT EXTRACTION PHACO AND INTRAOCULAR LENS PLACEMENT (IOC);  Surgeon: Eulogio Bear, MD;  Location: [facility] ORS;  Service: Ophthalmology;  Laterality: Right;  [place] 2.50 AP% 21.8 CDE 37.04 Fluid pack lot # [id number]  . CATARACT EXTRACTION W/PHACO Left 02/14/2016   Procedure: CATARACT EXTRACTION PHACO AND INTRAOCULAR LENS PLACEMENT (IOC);  Surgeon: Eulogio Bear, MD;  Location: [facility] ORS;  Service:  Ophthalmology;  Laterality: Left;  [place] 3.12 AP% 42.1 CDE 47.12 Fluid Pack lot # [id number]  . COLONOSCOPY    . FRACTURE SURGERY Left    ankle    Family History  Problem Relation Age of Onset  . Diabetes type II Other    Social History:  reports that she quit smoking about 23 years ago. Her smoking use included cigarettes. She has never used smokeless tobacco. She reports that she does not drink alcohol or use drugs.  Allergies:  Allergies  Allergen Reactions  . Ace Inhibitors Cough    Medications Prior to Admission  Medication Sig Dispense Refill  . amLODipine (NORVASC) 10 MG tablet Take 1 tablet (10 mg total) by mouth daily. 90 tablet 0  . aspirin EC 81 MG tablet Take 81 mg by mouth at bedtime.    . cloNIDine (CATAPRES - DOSED IN MG/24 HR) 0.3 mg/24hr patch PLACE 1 PATCH (0.3 MG TOTAL) ONTO THE SKIN ONCE A WEEK. (Patient taking differently: Place 0.3 mg onto the skin every Sunday. ) 4 patch 0  . desonide (DESOWEN) 0.05 % ointment Apply 1 application topically 2 (two) times daily. 60 g 0  . doxycycline (VIBRAMYCIN) 50 MG capsule Take 2 capsules (100 mg total) by mouth 2 (two) times daily for 7 days. 28 capsule 0  . ferrous sulfate 325 (65 FE) MG tablet Take 1 tablet (  325 mg total) by mouth 2 (two) times daily with a meal. 60 tablet 3  . furosemide (LASIX) 40 MG tablet Take 1 tablet (40 mg total) by mouth daily. 30 tablet 0  . gabapentin (NEURONTIN) 300 MG capsule TAKE 1 CAPSULE BY MOUTH THREE TIMES A DAY (Patient taking differently: Take 300 mg by mouth 3 (three) times daily. ) 270 capsule 0  . Insulin Glargine (LANTUS SOLOSTAR) 100 UNIT/ML Solostar Pen INJECT 35 UNITS SUBCUTANEOUSLY DAILY (Patient taking differently: Inject 28 Units into the skin daily. ) 15 pen 1  . insulin lispro (HUMALOG KWIKPEN) 100 UNIT/ML KiwkPen INJECT 5 UNITS SUBCUTANEOUSLY BEFORE MEALS; Dx E11.40, LON 99 months 5 pen 0  . medroxyPROGESTERone (PROVERA) 10 MG tablet Take 2 tablets (20 mg total) by mouth daily.  28 tablet 1  . montelukast (SINGULAIR) 10 MG tablet Take 1 tablet (10 mg total) by mouth daily. 90 tablet 2  . nystatin (NYSTATIN) powder Apply 1 g topically 3 (three) times daily. 45 g 1  . pravastatin (PRAVACHOL) 40 MG tablet TAKE 1 TABLET BY MOUTH EVERY DAY 30 tablet 0  . predniSONE (DELTASONE) 20 MG tablet Take 3 tablets (60 mg total) by mouth daily. 15 tablet 0  . ranitidine (ZANTAC) 150 MG tablet Take 1 tablet (150 mg total) by mouth 2 (two) times daily. Trying to wean off Omeprazole 180 tablet 1  . senna-docusate (SENOKOT-S) 8.6-50 MG tablet Take 2 tablets by mouth daily as needed for mild constipation. 30 tablet 1  . Vitamin D, Ergocalciferol, (DRISDOL) 50000 units CAPS capsule TAKE ONE CAPSULE BY MOUTH ONCE A WEEK On Sunday (Patient taking differently: Take 50,000 Units by mouth every Sunday. ) 4 capsule 1  . glucose blood (ACCU-CHEK AVIVA PLUS) test strip 1 each by Other route 2 (two) times daily. Use as instructed 200 each 3  . glucose blood (ACCU-CHEK AVIVA PLUS) test strip CHECK BLOOD SUGAR TWICE DAILY 200 each 5  . Insulin Pen Needle (NOVOFINE) 32G X 6 MM MISC Inject 1 application into the skin 4 (four) times daily. 100 each 5    Results for orders placed or performed during the hospital encounter of 06/28/18 (from the past 48 hour(s))  Blood gas, arterial     Status: Abnormal   Collection Time: 06/28/18  3:02 AM  Result Value Ref Range   FIO2 0.36    Delivery systems NASAL CANNULA    pH, Arterial 7.39 7.350 - 7.450   pCO2 arterial 49 (H) 32.0 - 48.0 mmHg   pO2, Arterial 46 (L) 83.0 - 108.0 mmHg   Bicarbonate 29.7 (H) 20.0 - 28.0 mmol/L   Acid-Base Excess 3.7 (H) 0.0 - 2.0 mmol/L   O2 Saturation 81.2 %   Patient temperature 37.0    Collection site LEFT RADIAL    Sample type ARTERIAL DRAW    Allens test (pass/fail) PASS PASS    Comment: Performed at [facility], [address] [place], [place] [address]  Comprehensive metabolic panel     Status: Abnormal    Collection Time: 06/28/18  3:08 AM  Result Value Ref Range   Sodium 141 135 - 145 mmol/L   Potassium 3.9 3.5 - 5.1 mmol/L   Chloride 105 98 - 111 mmol/L   CO2 28 22 - 32 mmol/L   Glucose, Bld 241 (H) 70 - 99 mg/dL   BUN 48 (H) 8 - 23 mg/dL   Creatinine, Ser 1.82 (H) 0.44 - 1.00 mg/dL   Calcium 8.7 (L) 8.9 - 10.3  mg/dL   Total Protein 7.6 6.5 - 8.1 g/dL   Albumin 3.2 (L) 3.5 - 5.0 g/dL   AST 7 (L) 15 - 41 U/L   ALT 6 0 - 44 U/L   Alkaline Phosphatase 54 38 - 126 U/L   Total Bilirubin 0.8 0.3 - 1.2 mg/dL   GFR calc non Af Amer 27 (L) >60 mL/min   GFR calc Af Amer 32 (L) >60 mL/min   Anion gap 8 5 - 15    Comment: Performed at [facility], [address] [place], [place] [address]  Brain natriuretic peptide     Status: Abnormal   Collection Time: 06/28/18  3:08 AM  Result Value Ref Range   B Natriuretic Peptide 266.0 (H) 0.0 - 100.0 pg/mL    Comment: Performed at [facility], [address] [place], [place] [address]  Troponin I - Once     Status: None   Collection Time: 06/28/18  3:08 AM  Result Value Ref Range   Troponin I <0.03 <0.03 ng/mL    Comment: Performed at [facility], [address] [place], [place] [address]  CBC with Differential     Status: Abnormal   Collection Time: 06/28/18  3:08 AM  Result Value Ref Range   WBC 14.0 (H) 4.0 - 10.5 K/uL   RBC 3.50 (L) 3.87 - 5.11 MIL/uL   Hemoglobin 7.7 (L) 12.0 - 15.0 g/dL    Comment: Reticulocyte Hemoglobin testing may be clinically indicated, consider ordering this additional test [id number]    HCT 26.9 (L) 36.0 - 46.0 %   MCV 76.9 (L) 80.0 - 100.0 fL   MCH 22.0 (L) 26.0 - 34.0 pg   MCHC 28.6 (L) 30.0 - 36.0 g/dL   RDW 16.1 (H) 11.5 - 15.5 %   Platelets 336 150 - 400 K/uL   nRBC 0.0 0.0 - 0.2 %   Neutrophils Relative % 86 %   Neutro Abs 12.1 (H) 1.7 - 7.7 K/uL   Lymphocytes Relative 6 %   Lymphs Abs 0.8 0.7 - 4.0 K/uL   Monocytes Relative 6 %   Monocytes Absolute 0.8 0.1 - 1.0  K/uL   Eosinophils Relative 1 %   Eosinophils Absolute 0.1 0.0 - 0.5 K/uL   Basophils Relative 0 %   Basophils Absolute 0.0 0.0 - 0.1 K/uL   Immature Granulocytes 1 %   Abs Immature Granulocytes 0.13 (H) 0.00 - 0.07 K/uL    Comment: Performed at [facility], [address] [place],  [address]  Influenza panel by PCR (type A & B)     Status: None   Collection Time: 06/28/18  3:08 AM  Result Value Ref Range   Influenza A By PCR NEGATIVE NEGATIVE   Influenza B By PCR NEGATIVE NEGATIVE    Comment: (NOTE) The Xpert Xpress Flu assay is intended as an aid in the diagnosis of  influenza and should not be used as a sole basis for treatment.  This  assay is FDA approved for nasopharyngeal swab specimens only. Nasal  washings and aspirates are unacceptable for Xpert Xpress Flu testing. Performed at [facility], [address] [place],  [address]    Dg Chest Port 1 View  Result Date: 06/28/2018 CLINICAL DATA:  Shortness of breath. EXAM: PORTABLE CHEST 1 VIEW COMPARISON:  Chest radiograph 3 days ago 06/25/2018 FINDINGS: Rotated exam as before. Cardiomegaly is similar. Slight increase in peribronchial thickening from prior exam. No focal airspace disease, pleural  effusion or pneumothorax. IMPRESSION: Slight increase in peribronchial thickening from prior exam, may reflect bronchitis or pulmonary edema. Stable cardiomegaly. Electronically Signed   By: Keith Rake M.D.   On: 06/28/2018 03:35    Review of Systems  Constitutional: Negative for chills and fever.  HENT: Negative for sore throat and tinnitus.   Eyes: Negative for blurred vision and redness.  Respiratory: Positive for shortness of breath. Negative for cough.   Cardiovascular: Negative for chest pain, palpitations, orthopnea and PND.  Gastrointestinal: Negative for abdominal pain, diarrhea, nausea and vomiting.  Genitourinary: Negative for dysuria, frequency and urgency.  Musculoskeletal:  Negative for joint pain and myalgias.  Skin: Negative for rash.       No lesions  Neurological: Negative for speech change, focal weakness and weakness.  Endo/Heme/Allergies: Does not bruise/bleed easily.       No temperature intolerance  Psychiatric/Behavioral: Negative for depression and suicidal ideas.    Blood pressure (!) 167/57, pulse 64, temperature 98.9 F (37.2 C), temperature source Oral, resp. rate 19, height 5\' 4"  (1.626 m), weight 130.2 kg, SpO2 99 %. Physical Exam  Vitals reviewed. Constitutional: She is oriented to person, place, and time. She appears well-developed and well-nourished.  HENT:  Head: Normocephalic and atraumatic.  Mouth/Throat: Oropharynx is clear and moist.  Eyes: Pupils are equal, round, and reactive to light. Conjunctivae and EOM are normal. No scleral icterus.  Neck: Normal range of motion. Neck supple. No JVD present. No tracheal deviation present. No thyromegaly present.  Cardiovascular: Normal rate, regular rhythm and normal heart sounds. Exam reveals no gallop and no friction rub.  No murmur heard. Respiratory: Tachypnea noted. She is in respiratory distress. She has rales.  BiPAP  GI: Soft. Bowel sounds are normal. She exhibits no distension. There is no abdominal tenderness.  Genitourinary:    Genitourinary Comments: Deferred   Musculoskeletal: Normal range of motion.        General: No edema.  Lymphadenopathy:    She has no cervical adenopathy.  Neurological: She is alert and oriented to person, place, and time. No cranial nerve deficit. She exhibits normal muscle tone.  Skin: Skin is warm and dry. No rash noted. No erythema.  Psychiatric: She has a normal mood and affect. Her behavior is normal. Judgment and thought content normal.     Assessment/Plan This is a 72 year old female admitted for respiratory failure. 1.  Respiratory failure: Acute on chronic; with hypoxemia.  Continue BiPAP until the patient may be transitioned to oxygen via  nasal cannula.  PCO2 is 49 which appears to be below the patient's baseline.  Aim to maintain oxygen saturations 88 to 92%.  Continue inhaled corticosteroid.  Consider addition of daily anticholinergic 2.  Acute on chronic kidney disease: Hydrate with intravenous fluid.  Avoid nephrotoxic agents 3.  Diabetes mellitus type 2: Continue basal insulin therapy in addition to sliding scale insulin 4.  Hypertension: Controlled; continue clonidine and amlodipine 5.  DVT prophylaxis: Heparin 6.  GI prophylaxis: None The patient is a full code.  I have personally spent 5 minutes in critical care time with this patient  Harrie Foreman, MD 06/28/2018, 5:08 AM

## 2018-06-28 NOTE — Progress Notes (Signed)
eLink Physician-Brief Progress Note Patient Name: Veronica Anderson DOB: June 01, 1946 MRN: [id number]   Date of Service  06/28/2018  HPI/Events of Note  72 yo female with PMH of CHF and COPD. Presents with AECOPD and CHF. Now on BiPAP. PCCM asked to assume care in ICU. VSS.  eICU Interventions  No new orders.      Intervention Category Evaluation Type: New Patient Evaluation  Lysle Dingwall 06/28/2018, 5:45 AM

## 2018-06-28 NOTE — Consult Note (Signed)
CRITICAL CARE NOTE  CC  Respiratory failure  SUBJECTIVE 72 yo admitted for severe respf failure on biPAP Unresponsive, critically ill and high risk for intubation  End stage COPD on oxygen at home Presented with progressive SOB and increased WOB   SIGNIFICANT EVENTS  Active Ambulatory Problems    Diagnosis Date Noted  . Anemia in chronic illness 02/23/2015  . Asthma, mild intermittent 02/23/2015  . Benign essential HTN 02/23/2015  . Cataract 04/16/2010  . Diastolic heart failure, NYHA class 3 ([place]) 02/23/2015  . Benign hypertension with chronic kidney disease, stage III ([place]) 02/23/2015  . CN (constipation) 02/23/2015  . Diabetes mellitus with neuropathy ([place]) 02/23/2015  . Dyslipidemia 02/23/2015  . Elevated ferritin 02/23/2015  . Gastro-esophageal reflux disease without esophagitis 02/23/2015  . LBP (low back pain) 02/23/2015  . Morbid obesity due to excess calories ([place]) 02/23/2015  . Obstructive apnea 02/23/2015  . Osteoarthritis 02/23/2015  . Neuropathy 02/23/2015  . Perennial allergic rhinitis with seasonal variation 02/23/2015  . History of pneumonia 02/23/2015  . Background diabetic retinopathy ([place]) 12/18/2009  . Vitamin D deficiency 02/23/2015  . Nocturnal oxygen desaturation 06/26/2015  . CHF (congestive heart failure), NYHA class I ([place]) 11/07/2016  . Prolonged Q-T interval on ECG 12/23/2016  . Anemia due to chronic kidney disease   . Chronic kidney disease (CKD), stage IV (severe) ([place]) 07/29/2017  . Mild protein-calorie malnutrition ([place]) 07/29/2017  . UTI (urinary tract infection) 09/11/2017  . Pressure injury of skin 09/11/2017  . Post-menopausal bleeding 11/18/2017   Resolved Ambulatory Problems    Diagnosis Date Noted  . Protein calorie malnutrition ([place]) 02/23/2015  . Acute on chronic kidney failure ([place]) 11/23/2013  . Dependence on supplemental oxygen 02/23/2015  . Acute respiratory failure ([place]) 03/27/2015   Past Medical History:  Diagnosis  Date  . Allergy   . Anemia   . Asthma   . CHF (NYHA class II, ACC/AHA stage C) ([place])   . Chronic kidney disease   . Constipation   . Diabetes mellitus without complication ([place])   . Edema   . GERD (gastroesophageal reflux disease)   . Hyperlipidemia   . Hypertension   . Left ankle pain   . Lumbago   . Myocardial infarction ([place])   . OA (osteoarthritis)   . Obesity   . Obstructive sleep apnea syndrome   . Paresthesia   . Requires supplemental oxygen   . Stroke ([facility])      BP (!) 119/57   Pulse 67   Temp 98.8 F (37.1 C) (Axillary)   Resp 19   Ht 5\' 4"  (1.626 m)   Wt 129.5 kg   SpO2 96%   BMI 49.01 kg/m    REVIEW OF SYSTEMS  PATIENT IS UNABLE TO PROVIDE COMPLETE REVIEW OF SYSTEMS DUE TO SEVERE CRITICAL ILLNESS   PHYSICAL EXAMINATION:  GENERAL:critically ill appearing, +resp distress HEAD: Normocephalic, atraumatic.  EYES: Pupils equal, round, reactive to light.  No scleral icterus.  MOUTH: Moist mucosal membrane. NECK: Supple. No thyromegaly. No nodules. No JVD.  PULMONARY: +rhonchi, +wheezing CARDIOVASCULAR: S1 and S2. Regular rate and rhythm. No murmurs, rubs, or gallops.  GASTROINTESTINAL: Soft, nontender, -distended. No masses. Positive bowel sounds. No hepatosplenomegaly.  MUSCULOSKELETAL: No swelling, clubbing, or edema.  NEUROLOGIC: obtunded, GCS<8 SKIN:intact,warm,dry      Indwelling Urinary Catheter continued, requirement due to   Reason to continue Indwelling Urinary Catheter for strict Intake/Output monitoring for hemodynamic instability   Central Line continued, requirement due to  ASSESSMENT AND PLAN SYNOPSIS   Severe Hypoxic and Hypercapnic Respiratory Failure -continue Bronchodilator Therapy continue bipap High risk for intubation   Renal Failure-most likely due to ATN -follow chem 7 -follow UO -continue Foley Catheter-assess need daily   NEUROLOGY Unresponsive consider CT chead   SEVERE COPD  EXACERBATION -continue IV steroids as prescribed -continue NEB THERAPY as prescribed -morphine as needed -wean fio2 as needed and tolerated    CARDIAC ICU monitoring  ID -continue IV abx as prescibed -follow up cultures  GI/Nutrition GI PROPHYLAXIS as indicated DIET-->NPO Constipation protocol as indicated  ENDO - ICU hypoglycemic\Hyperglycemia protocol -check FSBS per protocol   ELECTROLYTES -follow labs as needed -replace as needed -pharmacy consultation and following   DVT/GI PRX ordered TRANSFUSIONS AS NEEDED MONITOR FSBS ASSESS the need for LABS as needed   Critical Care Time devoted to patient care services described in this note is 45 minutes.   Overall, patient is critically ill, prognosis is guarded.  Patient with Multiorgan failure and at high risk for cardiac arrest and death.    Corrin Parker, M.D.  [facility]  Medical Director [facility] Cardio-Pulmonary Department

## 2018-06-28 NOTE — ED Notes (Signed)
Family at bedside.  POC  update

## 2018-06-28 NOTE — ED Provider Notes (Signed)
[facility] Emergency Department Provider Note  ____________________________________________   First MD Initiated Contact with Patient 06/28/18 0249     (approximate)  I have reviewed the triage vital signs and the nursing notes.   HISTORY  Chief Complaint Respiratory Distress  Level 5 exemption history limited by the patient's critical illness  HPI Veronica Anderson is a 72 y.o. female who comes to the emergency department via EMS with shortness of breath.  She has a complex past medical history including CHF, diabetes mellitus, COPD, and CKD.  When EMS arrived they found the patient was saturating in the high 80s on room air although came up with nasal cannula.  The patient was seen in our emergency department 3 days ago and was treated as a COPD exacerbation with breathing treatments then prednisone and doxycycline for home.  She said she initially improved however in the past 48 hours has acutely worsened.  Her symptoms are now severe.  They are worse with any exertion whatsoever and only minimally improved with rest.  This morning she could not catch her breath so her children called 911.    Past Medical History:  Diagnosis Date  . Allergy   . Anemia   . Asthma   . Cataract    bilateral  . CHF (NYHA class II, ACC/AHA stage C) ([place])   . Chronic kidney disease    stage IV (severe), Dr. Aleene Davidson  . Constipation   . Diabetes mellitus without complication ([place])   . Edema    feet/ankles  . GERD (gastroesophageal reflux disease)   . Hyperlipidemia   . Hypertension   . Left ankle pain   . Lumbago   . Myocardial infarction ([place])   . Neuropathy   . Neuropathy   . OA (osteoarthritis)   . Obesity   . Obstructive sleep apnea syndrome    CPAP  . Paresthesia    Foot  . Protein calorie malnutrition ([place])   . Requires supplemental oxygen   . Stroke ([place])   . Vitamin D deficiency     Patient Active Problem List   Diagnosis Date Noted  . Acute respiratory  failure with hypoxemia ([place]) 06/28/2018  . Post-menopausal bleeding 11/18/2017  . UTI (urinary tract infection) 09/11/2017  . Pressure injury of skin 09/11/2017  . Chronic kidney disease (CKD), stage IV (severe) ([place]) 07/29/2017  . Mild protein-calorie malnutrition ([place]) 07/29/2017  . Anemia due to chronic kidney disease   . Prolonged Q-T interval on ECG 12/23/2016  . CHF (congestive heart failure), NYHA class I ([place]) 11/07/2016  . Nocturnal oxygen desaturation 06/26/2015  . Anemia in chronic illness 02/23/2015  . Asthma, mild intermittent 02/23/2015  . Benign essential HTN 02/23/2015  . Diastolic heart failure, NYHA class 3 ([place]) 02/23/2015  . Benign hypertension with chronic kidney disease, stage III ([place]) 02/23/2015  . CN (constipation) 02/23/2015  . Diabetes mellitus with neuropathy ([place]) 02/23/2015  . Dyslipidemia 02/23/2015  . Elevated ferritin 02/23/2015  . Gastro-esophageal reflux disease without esophagitis 02/23/2015  . LBP (low back pain) 02/23/2015  . Morbid obesity due to excess calories ([place]) 02/23/2015  . Obstructive apnea 02/23/2015  . Osteoarthritis 02/23/2015  . Neuropathy 02/23/2015  . Perennial allergic rhinitis with seasonal variation 02/23/2015  . History of pneumonia 02/23/2015  . Vitamin D deficiency 02/23/2015  . Cataract 04/16/2010  . Background diabetic retinopathy ([place]) 12/18/2009    Past Surgical History:  Procedure Laterality Date  . ANKLE SURGERY Left   . CATARACT EXTRACTION W/PHACO  Right 12/20/2015   Procedure: CATARACT EXTRACTION PHACO AND INTRAOCULAR LENS PLACEMENT (IOC);  Surgeon: Eulogio Bear, MD;  Location: [facility] ORS;  Service: Ophthalmology;  Laterality: Right;  [place] 2.50 AP% 21.8 CDE 37.04 Fluid pack lot # [id number]  . CATARACT EXTRACTION W/PHACO Left 02/14/2016   Procedure: CATARACT EXTRACTION PHACO AND INTRAOCULAR LENS PLACEMENT (IOC);  Surgeon: Eulogio Bear, MD;  Location: [facility] ORS;  Service: Ophthalmology;  Laterality: Left;  [place]  3.12 AP% 42.1 CDE 47.12 Fluid Pack lot # [id number]  . COLONOSCOPY    . FRACTURE SURGERY Left    ankle    Prior to Admission medications   Medication Sig Start Date End Date Taking? Authorizing Provider  amLODipine (NORVASC) 10 MG tablet Take 1 tablet (10 mg total) by mouth daily. 10/23/17  Yes Poulose, Bethel Born, NP  aspirin EC 81 MG tablet Take 81 mg by mouth at bedtime.   Yes [provider]  cloNIDine (CATAPRES - DOSED IN MG/24 HR) 0.3 mg/24hr patch PLACE 1 PATCH (0.3 MG TOTAL) ONTO THE SKIN ONCE A WEEK. Patient taking differently: Place 0.3 mg onto the skin every Sunday.  01/28/18  Yes Sowles, Drue Stager, MD  desonide (DESOWEN) 0.05 % ointment Apply 1 application topically 2 (two) times daily. 10/23/17  Yes Poulose, Bethel Born, NP  doxycycline (VIBRAMYCIN) 50 MG capsule Take 2 capsules (100 mg total) by mouth 2 (two) times daily for 7 days. 06/25/18 07/02/18 Yes Eula Listen, MD  ferrous sulfate 325 (65 FE) MG tablet Take 1 tablet (325 mg total) by mouth 2 (two) times daily with a meal. 09/15/17  Yes Gladstone Lighter, MD  furosemide (LASIX) 40 MG tablet Take 1 tablet (40 mg total) by mouth daily. 10/23/17  Yes Poulose, Bethel Born, NP  gabapentin (NEURONTIN) 300 MG capsule TAKE 1 CAPSULE BY MOUTH THREE TIMES A DAY Patient taking differently: Take 300 mg by mouth 3 (three) times daily.  12/23/17  Yes Sowles, Drue Stager, MD  Insulin Glargine (LANTUS SOLOSTAR) 100 UNIT/ML Solostar Pen INJECT 35 UNITS SUBCUTANEOUSLY DAILY Patient taking differently: Inject 28 Units into the skin daily.  10/23/17  Yes Poulose, Bethel Born, NP  insulin lispro (HUMALOG KWIKPEN) 100 UNIT/ML KiwkPen INJECT 5 UNITS SUBCUTANEOUSLY BEFORE MEALS; Dx E11.40, LON 99 months 10/23/17  Yes Poulose, Bethel Born, NP  medroxyPROGESTERone (PROVERA) 10 MG tablet Take 2 tablets (20 mg total) by mouth daily. 11/18/17  Yes Gae Dry, MD  montelukast (SINGULAIR) 10 MG tablet Take 1 tablet (10 mg total) by mouth daily.  10/23/17  Yes Poulose, Bethel Born, NP  nystatin (NYSTATIN) powder Apply 1 g topically 3 (three) times daily. 10/23/17  Yes Poulose, Bethel Born, NP  pravastatin (PRAVACHOL) 40 MG tablet TAKE 1 TABLET BY MOUTH EVERY DAY 01/05/18  Yes Poulose, Bethel Born, NP  predniSONE (DELTASONE) 20 MG tablet Take 3 tablets (60 mg total) by mouth daily. 06/25/18  Yes Eula Listen, MD  ranitidine (ZANTAC) 150 MG tablet Take 1 tablet (150 mg total) by mouth 2 (two) times daily. Trying to wean off Omeprazole 10/23/17  Yes Poulose, Bethel Born, NP  senna-docusate (SENOKOT-S) 8.6-50 MG tablet Take 2 tablets by mouth daily as needed for mild constipation. 09/15/17 09/15/18 Yes [place] Lighter, MD  Vitamin D, Ergocalciferol, (DRISDOL) 50000 units CAPS capsule TAKE ONE CAPSULE BY MOUTH ONCE A WEEK On Sunday Patient taking differently: Take 50,000 Units by mouth every Sunday.  10/23/17  Yes Poulose, Bethel Born, NP  glucose blood (ACCU-CHEK AVIVA PLUS) test strip 1 each by Other  route 2 (two) times daily. Use as instructed 08/06/17   Steele Sizer, MD  glucose blood (ACCU-CHEK AVIVA PLUS) test strip CHECK BLOOD SUGAR TWICE DAILY 10/23/17   Poulose, Bethel Born, NP  Insulin Pen Needle (NOVOFINE) 32G X 6 MM MISC Inject 1 application into the skin 4 (four) times daily. 10/23/17   Poulose, Bethel Born, NP    Allergies Ace inhibitors  Family History  Problem Relation Age of Onset  . Diabetes type II Other     Social History Social History   Tobacco Use  . Smoking status: Former Smoker    Types: Cigarettes    Last attempt to quit: 07/14/1994    Years since quitting: 23.9  . Smokeless tobacco: Never Used  Substance Use Topics  . Alcohol use: No    Alcohol/week: 0.0 standard drinks  . Drug use: No    Review of Systems Level 5 exemption history is limited by the patient's clinical condition ____________________________________________   PHYSICAL EXAM:  VITAL SIGNS: ED Triage Vitals  Enc Vitals Group      BP      Pulse      Resp      Temp      Temp src      SpO2      Weight      Height      Head Circumference      Peak Flow      Pain Score      Pain Loc      Pain Edu?      Excl. in [place]?     Constitutional: Appears critically ill in severe respiratory distress gasping for air speaking in 1-2 word sentences Eyes: PERRL EOMI. Head: Atraumatic. Nose: No congestion/rhinnorhea. Mouth/Throat: No trismus Neck: No stridor.  Able to lie completely flat although it is uncomfortable.  She has large JVD Cardiovascular: Regular rate, regular rhythm. Grossly normal heart sounds.  Good peripheral circulation. Respiratory: Severe respiratory distress with very tight sounding lungs and moving limited amounts of air.  I do not appreciate any crackles and it actually sounds more like expiratory wheezing Gastrointestinal: Soft nontender.  Morbidly obese Musculoskeletal: Legs are equal in size Neurologic:  No gross focal neurologic deficits are appreciated. Skin:  Skin is warm, dry and intact. No rash noted. Psychiatric: Quite anxious appearing ____________________________________________   DIFFERENTIAL includes but not limited to  COPD exacerbation, pneumonia, pneumothorax, pulmonary embolism, acute pulmonary edema, acute coronary syndrome ____________________________________________   LABS (all labs ordered are listed, but only abnormal results are displayed)  Labs Reviewed  COMPREHENSIVE METABOLIC PANEL - Abnormal; Notable for the following components:      Result Value   Glucose, Bld 241 (*)    BUN 48 (*)    Creatinine, Ser 1.82 (*)    Calcium 8.7 (*)    Albumin 3.2 (*)    AST 7 (*)    GFR calc non Af Amer 27 (*)    GFR calc Af Amer 32 (*)    All other components within normal limits  BRAIN NATRIURETIC PEPTIDE - Abnormal; Notable for the following components:   B Natriuretic Peptide 266.0 (*)    All other components within normal limits  CBC WITH DIFFERENTIAL/PLATELET - Abnormal;  Notable for the following components:   WBC 14.0 (*)    RBC 3.50 (*)    Hemoglobin 7.7 (*)    HCT 26.9 (*)    MCV 76.9 (*)    MCH 22.0 (*)    MCHC  28.6 (*)    RDW 16.1 (*)    Neutro Abs 12.1 (*)    Abs Immature Granulocytes 0.13 (*)    All other components within normal limits  BLOOD GAS, ARTERIAL - Abnormal; Notable for the following components:   pCO2 arterial 49 (*)    pO2, Arterial 46 (*)    Bicarbonate 29.7 (*)    Acid-Base Excess 3.7 (*)    All other components within normal limits  GLUCOSE, CAPILLARY - Abnormal; Notable for the following components:   Glucose-Capillary 207 (*)    All other components within normal limits  GLUCOSE, CAPILLARY - Abnormal; Notable for the following components:   Glucose-Capillary 224 (*)    All other components within normal limits  MRSA PCR SCREENING  TROPONIN I  INFLUENZA PANEL BY PCR (TYPE A & B)  TSH  HEMOGLOBIN A1C    Lab work reviewed by me with a number of abnormalities.  Her arterial blood gas on room air shows a PO2 of only 46.  Elevated white count is concerning for infection. __________________________________________  EKG  ED ECG REPORT I, Darel Hong, the attending physician, personally viewed and interpreted this ECG.  Date: 06/28/2018 EKG Time:  Rate: 62 Rhythm: Normal sinus rhythm with premature junctional contractions QRS Axis: Leftward axis Intervals: normal ST/T Wave abnormalities: normal Narrative Interpretation: no evidence of acute ischemia  ____________________________________________  RADIOLOGY  Chest x-ray reviewed by me with worsening congestion compared to 3 days ago.  Most likely consolidation but cannot rule out edema ____________________________________________   PROCEDURES  Procedure(s) performed: Yes  .Critical Care Performed by: Darel Hong, MD Authorized by: Darel Hong, MD   Critical care provider statement:    Critical care time (minutes):  40   Critical care time was  exclusive of:  Separately billable procedures and treating other patients   Critical care was necessary to treat or prevent imminent or life-threatening deterioration of the following conditions:  Respiratory failure   Critical care was time spent personally by me on the following activities:  Development of treatment plan with patient or surrogate, discussions with consultants, evaluation of patient's response to treatment, examination of patient, obtaining history from patient or surrogate, ordering and performing treatments and interventions, ordering and review of laboratory studies, ordering and review of radiographic studies, pulse oximetry, re-evaluation of patient's condition and review of old charts   Angiocath insertion Performed by: Darel Hong  Consent: Verbal consent obtained. Risks and benefits: risks, benefits and alternatives were discussed Time out: Immediately prior to procedure a "time out" was called to verify the correct patient, procedure, equipment, support staff and site/side marked as required.  Preparation: Patient was prepped and draped in the usual sterile fashion.  Vein Location: Left external jugular vein  Gauge: 18  Normal blood return and flush without difficulty Patient tolerance: Patient tolerated the procedure well with no immediate complications.   Angiocath insertion Performed by: Darel Hong  Consent: Verbal consent obtained. Risks and benefits: risks, benefits and alternatives were discussed Time out: Immediately prior to procedure a "time out" was called to verify the correct patient, procedure, equipment, support staff and site/side marked as required.  Preparation: Patient was prepped and draped in the usual sterile fashion.  Vein Location: Right antecubital fossa  Ultrasound Guided  Gauge: 16  Normal blood return and flush without difficulty Patient tolerance: Patient tolerated the procedure well with no immediate  complications.     Critical Care performed: Yes  ____________________________________________   INITIAL IMPRESSION / ASSESSMENT  AND PLAN / ED COURSE  Pertinent labs & imaging results that were available during my care of the patient were reviewed by me and considered in my medical decision making (see chart for details).   As part of my medical decision making, I reviewed the following data within the [place] History obtained from family if available, nursing notes, old chart and ekg, as well as notes from prior ED visits.  Patient comes to the emergency department critically ill-appearing with elevated respiratory rate speaking in short gasps.  She does have JVD although is able to lie flat.  She saturating 71% on room air so I have placed her on BiPAP with near immediate improvement in her symptoms.  Broad lab work is pending.    ----------------------------------------- 3:38 AM on 06/28/2018 -----------------------------------------  It is difficult to know if the patient is suffering more from COPD or CHF today.  She does have some JVD but is able to lie completely flat and I think this is more pulmonary than cardiac.  She has failed outpatient steroids and outpatient antibiotics with doxycycline and given her severe hypoxia will require inpatient admission.  I will give her Solu-Medrol ceftriaxone and azithromycin and a 500 cc bolus now. ____________________________________________   The patient continues to do better on BiPAP although desaturates and decompensates when taking it off.  At this point she will require inpatient admission to the intensive care unit given her oxygen requirements.  I will continue treating her as a COPD exacerbation and I have kept the patient and HER-2 sons abreast of her situation throughout the course of her stay.  I then discussed with the hospitalist Dr. Marcille Blanco who has graciously agreed to admit the patient to his  service.  FINAL CLINICAL IMPRESSION(S) / ED DIAGNOSES  Final diagnoses:  Acute respiratory failure with hypoxia ([place])      NEW MEDICATIONS STARTED DURING THIS VISIT:  Current Discharge Medication List       Note:  This document was prepared using Dragon voice recognition software and may include unintentional dictation errors.     Darel Hong, MD 06/28/18 [phone]

## 2018-06-28 NOTE — Consult Note (Signed)
PULMONARY / CRITICAL CARE MEDICINE  Name: Veronica Anderson MRN: [id number] DOB: Apr 22, 1946    LOS: 0  Referring Provider: Dr. Marcille Blanco Reason for Referral: Acute on chronic COPD exacerbation  HPI: 72 year old female with a medical history as indicated below who presented to the ED via EMS with complaints of shortness of breath and lethargy.  Patient is on chronic O2 use at home at 2 L nasal cannula.  When EMS arrived, her SPO2 was 94%.  ED work-up showed a creatinine level of 1.82, up from her baseline of 1 6.  Her CBC showed mild leukocytosis, and anemia with a hemoglobin of 7.7.  Her chest x-ray showed increased peribronchial thickening suggestive of bronchitis or edema.  She is being admitted to the ICU on BiPAP  Past Medical History:  Diagnosis Date  . Allergy   . Anemia   . Asthma   . Cataract    bilateral  . CHF (NYHA class II, ACC/AHA stage C) ([place])   . Chronic kidney disease    stage IV (severe), Dr. Aleene Davidson  . Constipation   . Diabetes mellitus without complication ([place])   . Edema    feet/ankles  . GERD (gastroesophageal reflux disease)   . Hyperlipidemia   . Hypertension   . Left ankle pain   . Lumbago   . Myocardial infarction ([place])   . Neuropathy   . Neuropathy   . OA (osteoarthritis)   . Obesity   . Obstructive sleep apnea syndrome    CPAP  . Paresthesia    Foot  . Protein calorie malnutrition ([place])   . Requires supplemental oxygen   . Stroke ([place])   . Vitamin D deficiency    Past Surgical History:  Procedure Laterality Date  . ANKLE SURGERY Left   . CATARACT EXTRACTION W/PHACO Right 12/20/2015   Procedure: CATARACT EXTRACTION PHACO AND INTRAOCULAR LENS PLACEMENT (IOC);  Surgeon: Eulogio Bear, MD;  Location: [facility] ORS;  Service: Ophthalmology;  Laterality: Right;  [place] 2.50 AP% 21.8 CDE 37.04 Fluid pack lot # [id number]  . CATARACT EXTRACTION W/PHACO Left 02/14/2016   Procedure: CATARACT EXTRACTION PHACO AND INTRAOCULAR LENS PLACEMENT (IOC);  Surgeon: Eulogio Bear, MD;  Location: [facility] ORS;  Service: Ophthalmology;  Laterality: Left;  [place] 3.12 AP% 42.1 CDE 47.12 Fluid Pack lot # [id number]  . COLONOSCOPY    . FRACTURE SURGERY Left    ankle   Prior to Admission medications   Medication Sig Start Date End Date Taking? Authorizing Provider  amLODipine (NORVASC) 5 MG tablet Take 5 mg by mouth daily.   Yes [provider]  clopidogrel (PLAVIX) 75 MG tablet Take 75 mg by mouth daily.   Yes [provider]  donepezil (ARICEPT) 5 MG tablet Take 1 tablet (5 mg total) by mouth at bedtime. 03/08/18 04/17/18 Yes Sowles, Drue Stager, MD  empagliflozin (JARDIANCE) 25 MG TABS tablet Take 25 mg by mouth daily.   Yes [provider]  glycopyrrolate (ROBINUL) 1 MG tablet Take 1 mg by mouth 2 (two) times daily.   Yes [provider]  insulin aspart (NOVOLOG FLEXPEN) 100 UNIT/ML FlexPen Inject 12 Units into the skin 2 (two) times daily.   Yes [provider]  insulin aspart (NOVOLOG) 100 UNIT/ML FlexPen Inject 18 Units into the skin daily. At 1700   Yes [provider]  Insulin Degludec-Liraglutide (XULTOPHY) 100-3.6 UNIT-MG/ML SOPN Inject 50 Units into the skin daily.   Yes [provider]  levETIRAcetam (KEPPRA) 500 MG tablet Take 500  mg by mouth 2 (two) times daily.   Yes [provider]  lipase/protease/amylase (CREON) 12000 units CPEP capsule Take 6,000 Units by mouth 3 (three) times daily before meals.   Yes [provider]  lipase/protease/amylase (CREON) 12000 units CPEP capsule Take 3,000 Units by mouth at bedtime. With snack   Yes [provider]  lisinopril (PRINIVIL,ZESTRIL) 5 MG tablet Take 5 mg by mouth daily.   Yes [provider]  metoprolol succinate (TOPROL-XL) 25 MG 24 hr tablet Take 1 tablet (25 mg total) by mouth daily. 03/08/18  Yes Sowles, Drue Stager, MD  rosuvastatin (CRESTOR) 40 MG tablet Take 1 tablet (40 mg total) by mouth daily. 03/08/18 04/17/18 Yes  Steele Sizer, MD  aspirin EC 81 MG tablet Take 81 mg by mouth daily.    [provider]  famotidine (PEPCID) 20 MG tablet Take 1 tablet (20 mg total) by mouth 2 (two) times daily. 03/08/18 04/07/18  Steele Sizer, MD  gabapentin (NEURONTIN) 300 MG capsule Take 1 capsule (300 mg total) by mouth 2 (two) times daily. 03/08/18 04/07/18  Steele Sizer, MD  insulin glargine (LANTUS) 100 UNIT/ML injection Inject 0.1 mLs (10 Units total) into the skin daily. 03/08/18 04/07/18  Steele Sizer, MD  lacosamide 100 MG TABS Take 1 tablet (100 mg total) by mouth 2 (two) times daily. Patient not taking: Reported on 04/17/2018 07/24/17   Fritzi Mandes, MD  promethazine (PHENERGAN) 12.5 MG tablet Take 1 tablet (12.5 mg total) by mouth every 6 (six) hours as needed for nausea or vomiting. Patient not taking: Reported on 04/17/2018 05/12/17   Stark Klein, MD  sertraline (ZOLOFT) 25 MG tablet Take 1 tablet (25 mg total) by mouth daily. Patient not taking: Reported on 04/17/2018 03/08/18   Steele Sizer, MD   Allergies Allergies  Allergen Reactions  . Ace Inhibitors Cough    Family History Family History  Problem Relation Age of Onset  . Diabetes type II Other    Social History  reports that she quit smoking about 23 years ago. Her smoking use included cigarettes. She has never used smokeless tobacco. She reports that she does not drink alcohol or use drugs.  Review Of Systems: Unable to obtain as patient is on continuous BiPAP VITAL SIGNS: BP (!) 119/57   Pulse 67   Temp 98.8 F (37.1 C) (Axillary)   Resp 19   Ht 5\' 4"  (1.626 m)   Wt 129.5 kg   SpO2 96%   BMI 49.01 kg/m   HEMODYNAMICS:    VENTILATOR SETTINGS: FiO2 (%):  [32 %] 32 %  INTAKE / OUTPUT: No intake/output data recorded.  PHYSICAL EXAMINATION: General:  Chronically ill looking, in moderate respiratory distress HEENT:  PERRLA, no JVD Neur: obtunded, minimal response to noxious stimulus Cardiovascular:  Rrr, S1/S2, no  MRG Lungs:  Increased wob, diminished breath sounds in all lung fields, +wheezing Abdomen: normal BS X 4 Musculoskeletal:  +ROM, no deformities Skin:  Warm and dry  LABS:  BMET Recent Labs  Lab 06/25/18 2028 06/28/18 0308  NA 139 141  K 4.0 3.9  CL 105 105  CO2 28 28  BUN 42* 48*  CREATININE 1.67* 1.82*  GLUCOSE 171* 241*    Electrolytes Recent Labs  Lab 06/25/18 2028 06/28/18 0308  CALCIUM 8.3* 8.7*    CBC Recent Labs  Lab 06/25/18 2028 06/28/18 0308  WBC 11.8* 14.0*  HGB 8.1* 7.7*  HCT 28.3* 26.9*  PLT 292 336    Coag's No results for input(s):  APTT, INR in the last 168 hours.  Sepsis Markers Recent Labs  Lab 06/25/18 2028  LATICACIDVEN 1.3    ABG Recent Labs  Lab 06/28/18 0302  PHART 7.39  PCO2ART 49*  PO2ART 46*    Liver Enzymes Recent Labs  Lab 06/25/18 2028 06/28/18 0308  AST 13* 7*  ALT 10 6  ALKPHOS 60 54  BILITOT 0.9 0.8  ALBUMIN 3.1* 3.2*    Cardiac Enzymes Recent Labs  Lab 06/25/18 2028 06/28/18 0308  TROPONINI <0.03 <0.03    Glucose Recent Labs  Lab 06/28/18 0544  GLUCAP 207*    Imaging Dg Chest Port 1 View  Result Date: 06/28/2018 CLINICAL DATA:  Shortness of breath. EXAM: PORTABLE CHEST 1 VIEW COMPARISON:  Chest radiograph 3 days ago 06/25/2018 FINDINGS: Rotated exam as before. Cardiomegaly is similar. Slight increase in peribronchial thickening from prior exam. No focal airspace disease, pleural effusion or pneumothorax. IMPRESSION: Slight increase in peribronchial thickening from prior exam, may reflect bronchitis or pulmonary edema. Stable cardiomegaly. Electronically Signed   By: Keith Rake M.D.   On: 06/28/2018 03:35   ASSESSMENT  Acute on chronic COPD exacerbation Acute on chronic respiratory failure with hypoxemia and hypercarbia Acute on chronic renal failure Chronic anemia  PLAN Continuous BiPAP and titrate to nasal cannula as tolerated Nebulized bronchodilators and steroids Azithromycin  and ceftriaxone for COPD exacerbation possible C community-acquired pneumonia Trend CBC and transfuse if hemoglobin less than 7 Trend creatinine and avoid nephrotoxic drugs Resume all home medications   Best Practice: Code Status: Full code Diet: N.p.o. while on BiPAP GI prophylaxis: Not indicated VTE prophylaxis: Subcu heparin  FAMILY  - Updates: No family at bedside will update when available  Dontario Evetts S. [facility] ANP-BC Pulmonary and Critical Care Medicine [facility] Pager [phone] or [phone]  NB: This document was prepared using Dragon voice recognition software and may include unintentional dictation errors.    06/28/2018, 6:55 AM

## 2018-06-29 LAB — BASIC METABOLIC PANEL
Anion gap: 9 (ref 5–15)
BUN: 50 mg/dL — ABNORMAL HIGH (ref 8–23)
CO2: 27 mmol/L (ref 22–32)
Calcium: 8.6 mg/dL — ABNORMAL LOW (ref 8.9–10.3)
Chloride: 105 mmol/L (ref 98–111)
Creatinine, Ser: 1.61 mg/dL — ABNORMAL HIGH (ref 0.44–1.00)
GFR calc Af Amer: 37 mL/min — ABNORMAL LOW (ref >60)
GFR calc non Af Amer: 32 mL/min — ABNORMAL LOW (ref >60)
Glucose, Bld: 304 mg/dL — ABNORMAL HIGH (ref 70–99)
Potassium: 4.6 mmol/L (ref 3.5–5.1)
SODIUM: 141 mmol/L (ref 135–145)

## 2018-06-29 LAB — GLUCOSE, CAPILLARY
Glucose-Capillary: 274 mg/dL — ABNORMAL HIGH (ref 70–99)
Glucose-Capillary: 283 mg/dL — ABNORMAL HIGH (ref 70–99)
Glucose-Capillary: 192 mg/dL — ABNORMAL HIGH (ref 70–99)
Glucose-Capillary: 228 mg/dL — ABNORMAL HIGH (ref 70–99)

## 2018-06-29 MED ORDER — DOXYCYCLINE HYCLATE 100 MG PO TABS
100.0000 mg | ORAL_TABLET | Freq: Two times a day (BID) | ORAL | Status: DC
  Administered 2018-06-29 – 2018-06-30 (×3): 100 mg via ORAL
  Filled 2018-06-29 (×4): qty 1

## 2018-06-29 MED ORDER — ALBUTEROL SULFATE (2.5 MG/3ML) 0.083% IN NEBU: 2.5000 mg | INHALATION_SOLUTION | RESPIRATORY_TRACT | Status: DC | PRN

## 2018-06-29 MED ORDER — INSULIN GLARGINE 100 UNIT/ML ~~LOC~~ SOLN
28.0000 [IU] | Freq: Every day | SUBCUTANEOUS | Status: DC
  Administered 2018-06-29: 21:00:00 28 [IU] via SUBCUTANEOUS
  Filled 2018-06-29 (×2): qty 0.28

## 2018-06-29 MED ORDER — PREDNISONE 50 MG PO TABS
50.0000 mg | ORAL_TABLET | Freq: Every day | ORAL | Status: DC
  Administered 2018-06-29 – 2018-06-30 (×2): 50 mg via ORAL
  Filled 2018-06-29 (×2): qty 1

## 2018-06-29 MED ORDER — IPRATROPIUM-ALBUTEROL 0.5-2.5 (3) MG/3ML IN SOLN
3.0000 mL | Freq: Three times a day (TID) | RESPIRATORY_TRACT | Status: DC
  Administered 2018-06-29 – 2018-06-30 (×4): 3 mL via RESPIRATORY_TRACT
  Filled 2018-06-29 (×4): qty 3

## 2018-06-29 MED ORDER — INSULIN ASPART 100 UNIT/ML ~~LOC~~ SOLN
4.0000 [IU] | Freq: Three times a day (TID) | SUBCUTANEOUS | Status: DC
  Administered 2018-06-29 – 2018-06-30 (×3): 4 [IU] via SUBCUTANEOUS
  Filled 2018-06-29 (×3): qty 1

## 2018-06-29 NOTE — Progress Notes (Signed)
* [facility]     Assessment and Plan:  Acute on chronic hypoxic respiratory failure. - Possibly secondary to acute exacerbation of COPD/acute bronchitis. - Now appears to be doing better, continue nebulizer treatments.  Obstructive sleep apnea, acute on chronic hypercapnic respiratory failure. - Possible obesity hypoventilation syndrome. - Will need further assessment outpatient with pulmonary function testing.  Will need to obtain outpatient sleep study results. - Continue CPAP nightly.  Will arrange follow-up with me in 2 to 4 weeks.  Okay for DC from respiratory standpoint.   Date: 06/29/2018  MRN# [id number] Veronica Anderson [birth year]   Veronica Anderson is a 72 y.o. old female seen in follow up for chief complaint of  Chief Complaint  Patient presents with  . Respiratory Distress     HPI:  The patient is a 72 year old female with diabetes mellitus, essential hypertension, GERD, chronic kidney disease, obstructive sleep apnea, diastolic congestive heart failure.  She apparently has a history of COPD and asthma, on 3 L of oxygen at baseline.  Our patient and family deny history of COPD.  She presented to the ED on 06/25/2018 with several weeks of cough, increasing shortness of breath, discharged but presented back on 12/16.  On this occasion she was admitted with acute COPD exacerbation to the ICU on BiPAP initially. She was treated with IV steroids, nebulizers with improvement.  Currently she is on Pulmicort nebulizer, DuoNeb.  Review of most recent ABG shows pH of 7.39/40 9/40 6/29.7.  Patient also has a history of obstructive sleep apnea, patient and family tell me that she uses it every night at home.  Settings are unknown.  Chest x-ray 06/28/2018>> imaging personally reviewed, mild changes of chronic bronchitis, possible interstitial edema lungs otherwise unremarkable, obesity with elevated right diaphragm.  Medication:    Current Facility-Administered  Medications:  .  acetaminophen (TYLENOL) tablet 650 mg, 650 mg, Oral, Q6H PRN **OR** acetaminophen (TYLENOL) suppository 650 mg, 650 mg, Rectal, Q6H PRN, Harrie Foreman, MD .  amLODipine (NORVASC) tablet 10 mg, 10 mg, Oral, Daily, Harrie Foreman, MD, 10 mg at 06/28/18 1040 .  aspirin EC tablet 81 mg, 81 mg, Oral, QHS, Harrie Foreman, MD, 81 mg at 06/28/18 2231 .  budesonide (PULMICORT) nebulizer solution 0.25 mg, 0.25 mg, Nebulization, BID, Tukov-Yual, Magdalene S, NP, 0.25 mg at 06/29/18 0823 .  chlorhexidine (PERIDEX) 0.12 % solution 15 mL, 15 mL, Mouth Rinse, BID, Tukov-Yual, Magdalene S, NP, 15 mL at 06/28/18 2232 .  [START ON 07/04/2018] cloNIDine (CATAPRES - Dosed in mg/24 hr) patch 0.3 mg, 0.3 mg, Transdermal, Q Sun, Diamond, Michael S, MD .  docusate sodium (COLACE) capsule 100 mg, 100 mg, Oral, BID, Harrie Foreman, MD, 100 mg at 06/28/18 2231 .  famotidine (PEPCID) tablet 20 mg, 20 mg, Oral, BID, Harrie Foreman, MD, 20 mg at 06/28/18 2231 .  ferrous sulfate tablet 325 mg, 325 mg, Oral, BID WC, Harrie Foreman, MD, 325 mg at 06/28/18 1741 .  furosemide (LASIX) tablet 40 mg, 40 mg, Oral, Daily, Harrie Foreman, MD, 40 mg at 06/28/18 1040 .  gabapentin (NEURONTIN) capsule 300 mg, 300 mg, Oral, TID, Harrie Foreman, MD, 300 mg at 06/28/18 2231 .  heparin injection 5,000 Units, 5,000 Units, Subcutaneous, Q8H, Harrie Foreman, MD, 5,000 Units at 06/29/18 [phone] .  insulin aspart (novoLOG) injection 0-20 Units, 0-20 Units, Subcutaneous, TID WC, Harrie Foreman, MD, 11 Units at 06/29/18 [id number] .  insulin aspart (novoLOG) injection  0-5 Units, 0-5 Units, Subcutaneous, QHS, Harrie Foreman, MD, 3 Units at 06/28/18 2232 .  insulin glargine (LANTUS) injection 21 Units, 21 Units, Subcutaneous, QHS, Harrie Foreman, MD, 21 Units at 06/28/18 2330 .  ipratropium-albuterol (DUONEB) 0.5-2.5 (3) MG/3ML nebulizer solution 3 mL, 3 mL, Nebulization, Q6H, Tukov-Yual, Magdalene  S, NP, 3 mL at 06/29/18 0823 .  MEDLINE mouth rinse, 15 mL, Mouth Rinse, q12n4p, Tukov-Yual, Magdalene S, NP, 15 mL at 06/28/18 1742 .  medroxyPROGESTERone (PROVERA) tablet 20 mg, 20 mg, Oral, Daily, Harrie Foreman, MD, 20 mg at 06/28/18 1041 .  nystatin (MYCOSTATIN/NYSTOP) topical powder 1 g, 1 g, Topical, TID, Harrie Foreman, MD, 1 g at 06/28/18 2233 .  pravastatin (PRAVACHOL) tablet 40 mg, 40 mg, Oral, Daily, Harrie Foreman, MD, 40 mg at 06/28/18 1040   Allergies:  Ace inhibitors  Review of Systems:  Constitutional: Feels well. Cardiovascular: Denies chest pain, exertional chest pain.  Pulmonary: Denies hemoptysis, pleuritic chest pain.   The remainder of systems were reviewed and were found to be negative other than what is documented in the HPI.    Physical Examination:   VS: BP (!) 153/64 (BP Location: Left Arm)   Pulse 72   Temp 98.8 F (37.1 C) (Oral)   Resp 20   Ht 5\' 4"  (1.626 m)   Wt 131.2 kg   SpO2 100%   BMI 49.65 kg/m   General Appearance: No distress, obese.  Neuro:without focal findings, mental status, speech normal, alert and oriented HEENT: PERRLA, EOM intact Pulmonary: No wheezing, No rales  CardiovascularNormal S1,S2.  No m/r/g.  Abdomen: Benign, Soft, non-tender, No masses Renal:  No costovertebral tenderness  GU:  No performed at this time. Endoc: No evident thyromegaly, no signs of acromegaly or Cushing features Skin:   warm, no rashes, no ecchymosis  Extremities: normal, no cyanosis, clubbing.       LABORATORY PANEL:   CBC Recent Labs  Lab 06/28/18 0308  WBC 14.0*  HGB 7.7*  HCT 26.9*  PLT 336   ------------------------------------------------------------------------------------------------------------------  Chemistries  Recent Labs  Lab 06/28/18 0308 06/29/18 0608  NA 141 141  K 3.9 4.6  CL 105 105  CO2 28 27  GLUCOSE 241* 304*  BUN 48* 50*  CREATININE 1.82* 1.61*  CALCIUM 8.7* 8.6*  AST 7*  --   ALT 6  --     ALKPHOS 54  --   BILITOT 0.8  --    ------------------------------------------------------------------------------------------------------------------  Cardiac Enzymes Recent Labs  Lab 06/28/18 0308  TROPONINI <0.03   ------------------------------------------------------------  RADIOLOGY:   No results found for this or any previous visit. Results for orders placed during the hospital encounter of 11/07/16  DG Chest 2 View   Narrative CLINICAL DATA:  Shortness of breath.  CHF.  History of pneumonia.  EXAM: CHEST  2 VIEW  COMPARISON:  03/29/2015 .  FINDINGS: Cardiomegaly with mild bilateral interstitial prominence consistent with CHF. Pneumonitis cannot be excluded. Small bilateral pleural effusions cannot be excluded. No pneumothorax.  IMPRESSION: Congestive heart failure with mild bilateral pulmonary interstitial edema. Small bilateral pleural effusions cannot be excluded .   Electronically Signed   By: Marcello Moores  Register   On: 11/07/2016 08:01    ------------------------------------------------------------------------------------------------------------------  Thank  you for allowing [facility] to assist in the care of your patient. Our recommendations are noted above.  Please contact us if we can be of further service.   Marda Stalker, M.D., F.C.C.P.  Board Certified  in Internal Medicine, Pulmonary Medicine, Critical Care Medicine, and Sleep Medicine.  [facility] Office Number: [phone]  06/29/2018

## 2018-06-29 NOTE — Evaluation (Signed)
Physical Therapy Evaluation Patient Details Name: Veronica Anderson MRN: [id number] DOB: [birth year] Today's Date: 06/29/2018   History of Present Illness  72 y/o female who was here in the ED a few days ago, now returns with respiratory failure.   past medical history of COPD, OSA, hypertension, diastolic CHF, diabetes and hyperlipidemia presents to the emergency with shortness of breath.  Clinical Impression  Pt is functionally limited at baseline (walking <10 ft and needing assist with all mobility/transfers/etc).  She has been even more limited in recent weeks and she has hardly been out of the bed.  Family reports that they still feel that they can take care of her at home and neither they nor the patient wish to purse higher level care (short of long term).  Pt able to do ~10 minutes of exercises in bed but struggled profoundly with trying to get to standing even with +2 heavy assist.    Follow Up Recommendations Home health PT(family/pt not interested in STR)    Equipment Recommendations  None recommended by PT    Recommendations for Other Services       Precautions / Restrictions Precautions Precautions: Fall Restrictions Weight Bearing Restrictions: No      Mobility  Bed Mobility Overal bed mobility: Needs Assistance Bed Mobility: Supine to Sit;Sit to Supine     Supine to sit: Mod assist Sit to supine: Max assist   General bed mobility comments: Pt showed good effort in getting to EOB, but did need significant assist to attain upright, needed heavier assist to get LEs back up into bed  Transfers Overall transfer level: Needs assistance Equipment used: Rolling walker (2 wheeled) Transfers: Sit to/from Stand Sit to Stand: +2 physical assistance;Max assist         General transfer comment: 3 attempts at standing with limited ability to actually get to full upright.  Even with very heavy assist and encouragement she was unable to get hips forward enough or put weight  appropriately only the walker  Ambulation/Gait             General Gait Details: unable and unsafe to even try  Stairs            Wheelchair Mobility    Modified Rankin (Stroke Patients Only)       Balance Overall balance assessment: Needs assistance Sitting-balance support: Bilateral upper extremity supported Sitting balance-Leahy Scale: Fair       Standing balance-Leahy Scale: Zero                               Pertinent Vitals/Pain Pain Assessment: (pt c/o vague chronic pain)    Home Living Family/patient expects to be discharged to:: Private residence Living Arrangements: Children Available Help at Discharge: Available 24 hours/day;Family   Home Access: Ramped entrance     Home Layout: One level Home Equipment: Cane - single point;Bedside commode;Wheelchair - [employer] - 2 wheels      Prior Function Level of Independence: Needs assistance   Gait / Transfers Assistance Needed: apparently she has not been able to do any walking for the last many weeks.  Baseline, however, is not even room to room ambulation (<10 ft). Family essentially   ADL's / Homemaking Assistance Needed: Aide assist with sponge bathing, cleaning, sons do the cooking.          Hand Dominance        Extremity/Trunk Assessment   Upper Extremity Assessment Upper  Extremity Assessment: Generalized weakness(functional AROM in limited range)    Lower Extremity Assessment Lower Extremity Assessment: Generalized weakness       Communication   Communication: No difficulties  Cognition Arousal/Alertness: Lethargic Behavior During Therapy: WFL for tasks assessed/performed Overall Cognitive Status: Within Functional Limits for tasks assessed                                        General Comments      Exercises Total Joint Exercises Ankle Circles/Pumps: Strengthening;10 reps Quad Sets: Strengthening;10 reps Heel Slides: AAROM;10  reps Hip ABduction/ADduction: Strengthening;AROM;10 reps Knee Flexion: AROM;10 reps   Assessment/Plan    PT Assessment Patient needs continued PT services  PT Problem List Decreased strength;Decreased range of motion;Decreased activity tolerance;Decreased balance;Decreased mobility;Decreased coordination;Decreased knowledge of use of DME;Decreased safety awareness;Cardiopulmonary status limiting activity;Pain       PT Treatment Interventions DME instruction;Gait training;Functional mobility training;Therapeutic activities;Therapeutic exercise;Balance training;Neuromuscular re-education;Patient/family education    PT Goals (Current goals can be found in the Care Plan section)  Acute Rehab PT Goals Patient Stated Goal: get back to taking a few steps PT Goal Formulation: With patient Time For Goal Achievement: 07/13/18 Potential to Achieve Goals: Fair    Frequency Min 2X/week   Barriers to discharge        Co-evaluation               AM-PAC PT "6 Clicks" Mobility  Outcome Measure Help needed turning from your back to your side while in a flat bed without using bedrails?: A Lot Help needed moving from lying on your back to sitting on the side of a flat bed without using bedrails?: A Lot Help needed moving to and from a bed to a chair (including a wheelchair)?: Total Help needed standing up from a chair using your arms (e.g., wheelchair or bedside chair)?: Total Help needed to walk in hospital room?: Total Help needed climbing 3-5 steps with a railing? : Total 6 Click Score: 8    End of Session Equipment Utilized During Treatment: Gait belt Activity Tolerance: Patient limited by fatigue Patient left: with bed alarm set;with call bell/phone within reach;with family/visitor present   PT Visit Diagnosis: Muscle weakness (generalized) (M62.81);Difficulty in walking, not elsewhere classified (R26.2)    Time: 1610-9604 PT Time Calculation (min) (ACUTE ONLY): 31  min   Charges:   PT Evaluation $PT Eval Low Complexity: 1 Low PT Treatments $Therapeutic Exercise: 8-22 mins        Kreg Shropshire, DPT 06/29/2018, 4:17 PM

## 2018-06-29 NOTE — Progress Notes (Signed)
[place] [facility] [place] NAME: Crista Nuon    MR#:  [id number]  DATE OF BIRTH:  March 18, 1946  SUBJECTIVE:  CHIEF COMPLAINT:   Chief Complaint  Patient presents with  . Respiratory Distress   SOB is much improved.  REVIEW OF SYSTEMS:    Review of Systems  Constitutional: Positive for malaise/fatigue. Negative for chills and fever.  HENT: Negative for sore throat.   Eyes: Negative for blurred vision, double vision and pain.  Respiratory: Positive for cough and shortness of breath. Negative for hemoptysis and wheezing.   Cardiovascular: Negative for chest pain, palpitations, orthopnea and leg swelling.  Gastrointestinal: Negative for abdominal pain, constipation, diarrhea, heartburn, nausea and vomiting.  Genitourinary: Negative for dysuria and hematuria.  Musculoskeletal: Negative for back pain and joint pain.  Skin: Negative for rash.  Neurological: Negative for sensory change, speech change, focal weakness and headaches.  Endo/Heme/Allergies: Does not bruise/bleed easily.  Psychiatric/Behavioral: Negative for depression. The patient is not nervous/anxious.     DRUG ALLERGIES:   Allergies  Allergen Reactions  . Ace Inhibitors Cough    VITALS:  Blood pressure (!) 155/62, pulse 81, temperature 98.8 F (37.1 C), temperature source Oral, resp. rate 16, height 5\' 4"  (1.626 m), weight 131.2 kg, SpO2 97 %.  PHYSICAL EXAMINATION:   Physical Exam  GENERAL:  72 y.o.-year-old patient lying in the bed with no acute distress.Obese  EYES: Pupils equal, round, reactive to light and accommodation. No scleral icterus. Extraocular muscles intact.  HEENT: Head atraumatic, normocephalic. Oropharynx and nasopharynx clear.  NECK:  Supple, no jugular venous distention. No thyroid enlargement, no tenderness.  LUNGS:rales and ronchi CARDIOVASCULAR: S1, S2 normal. No murmurs, rubs, or gallops.  ABDOMEN: Soft, nontender, nondistended. Bowel sounds present.  No organomegaly or mass.  EXTREMITIES: No cyanosis, clubbing or edema b/l.    NEUROLOGIC: Cranial nerves II through XII are intact. No focal Motor or sensory deficits b/l.   PSYCHIATRIC: The patient is alert and awake SKIN: No obvious rash, lesion, or ulcer.   LABORATORY PANEL:   CBC Recent Labs  Lab 06/28/18 0308  WBC 14.0*  HGB 7.7*  HCT 26.9*  PLT 336   ------------------------------------------------------------------------------------------------------------------ Chemistries  Recent Labs  Lab 06/28/18 0308 06/29/18 0608  NA 141 141  K 3.9 4.6  CL 105 105  CO2 28 27  GLUCOSE 241* 304*  BUN 48* 50*  CREATININE 1.82* 1.61*  CALCIUM 8.7* 8.6*  AST 7*  --   ALT 6  --   ALKPHOS 54  --   BILITOT 0.8  --    ------------------------------------------------------------------------------------------------------------------  Cardiac Enzymes Recent Labs  Lab 06/28/18 0308  TROPONINI <0.03   ------------------------------------------------------------------------------------------------------------------  RADIOLOGY:  Dg Chest Port 1 View  Result Date: 06/28/2018 CLINICAL DATA:  Shortness of breath. EXAM: PORTABLE CHEST 1 VIEW COMPARISON:  Chest radiograph 3 days ago 06/25/2018 FINDINGS: Rotated exam as before. Cardiomegaly is similar. Slight increase in peribronchial thickening from prior exam. No focal airspace disease, pleural effusion or pneumothorax. IMPRESSION: Slight increase in peribronchial thickening from prior exam, may reflect bronchitis or pulmonary edema. Stable cardiomegaly. Electronically Signed   By: Keith Rake M.D.   On: 06/28/2018 03:35     ASSESSMENT AND PLAN:   This is a 72 year old female admitted for respiratory failure.  * Acute on chronic hypoxic respiratory failure secondary to combination of COPD and CHF.  Wean oxygen as tolerated. Continue BiPAP until the patient may be transitioned to oxygen via nasal cannula.  PCO2 is 49 which  appears to be below the patient's baseline.  Aim to maintain oxygen saturations 88 to 92%.  Continue inhaled corticosteroid.  Consider addition of daily anticholinergic  *COPD exacerbation.  IV steroids, nebulizers.  Inhalers.  *Acute on chronic diastolic congestive heart failure.  Continue IV Lasix.  Monitor input and output.  Repeat labs in the morning.  *CKD stage III is stable  *Diabetes mellitus.  Increase Lantus dose.  Add pre-meal NovoLog.  *Hypertension: Controlled; continue clonidine and amlodipine  All the records are reviewed and case discussed with Care Management/Social Worker Management plans discussed with the patient, family and they are in agreement.  CODE STATUS: FULL CODE  DVT Prophylaxis: SCDs  TOTAL TIME TAKING CARE OF THIS PATIENT: 35 minutes.   POSSIBLE D/C IN 1-2 DAYS, DEPENDING ON CLINICAL CONDITION.  Leia Alf Momina Hunton M.D on 06/29/2018 at 1:04 PM  Between 7am to 6pm - Pager - [phone]  After 6pm go to [email or web address] - password [email or web address] [facility]  Office  [phone]  CC: Primary care physician; Tracie Harrier, MD  Note: This dictation was prepared with Dragon dictation along with smaller phrase technology. Any transcriptional errors that result from this process are unintentional.

## 2018-06-29 NOTE — Progress Notes (Signed)
Inpatient Diabetes Program Recommendations  AACE/ADA: New Consensus Statement on Inpatient Glycemic Control (2015)  Target Ranges:  Prepandial:   less than 140 mg/dL      Peak postprandial:   less than 180 mg/dL (1-2 hours)      Critically ill patients:  140 - 180 mg/dL   Lab Results  Component Value Date   GLUCAP 274 (H) 06/29/2018   HGBA1C 5.7 (H) 06/28/2018    Review of Glycemic Control Results for [name], [name] (MRN [id number]) as of 06/29/2018 11:28  Ref. Range 06/28/2018 07:27 06/28/2018 11:38 06/28/2018 16:44 06/28/2018 21:25 06/29/2018 07:48  Glucose-Capillary Latest Ref Range: 70 - 99 mg/dL 224 (H) 275 (H) 341 (H) 281 (H) 274 (H)   Diabetes history: DM 2 Outpatient Diabetes medications:  Lantus 28 units daily, Humalog 5 units tid with meals Current orders for Inpatient glycemic control:  Novolog resistant tid with meals and HS Lantus 21 units q HS Inpatient Diabetes Program Recommendations:   Please consider increasing Lantus to 30 units q HS.  Also consider adding Novolog meal coverage 4 units tid with meals.    Thanks,  Veronica Perl, Veronica Anderson, Veronica Anderson Inpatient Diabetes Coordinator Pager [phone] (8a-5p)

## 2018-06-30 LAB — URINE CULTURE: Culture: >=100000 — AB

## 2018-06-30 LAB — CULTURE, BLOOD (ROUTINE X 2)
Culture: NO GROWTH
Culture: NO GROWTH
Special Requests: ADEQUATE

## 2018-06-30 LAB — GLUCOSE, CAPILLARY
Glucose-Capillary: 264 mg/dL — ABNORMAL HIGH (ref 70–99)
Glucose-Capillary: 234 mg/dL — ABNORMAL HIGH (ref 70–99)

## 2018-06-30 MED ORDER — FLEET ENEMA 7-19 GM/118ML RE ENEM: 1.0000 | ENEMA | Freq: Once | RECTAL | Status: DC

## 2018-06-30 MED ORDER — SENNOSIDES-DOCUSATE SODIUM 8.6-50 MG PO TABS
1.0000 | ORAL_TABLET | Freq: Two times a day (BID) | ORAL | Status: DC
  Administered 2018-06-30: 13:00:00 1 via ORAL
  Filled 2018-06-30: qty 1

## 2018-06-30 MED ORDER — POLYETHYLENE GLYCOL 3350 17 G PO PACK
17.0000 g | PACK | Freq: Every day | ORAL | Status: DC
  Administered 2018-06-30: 17 g via ORAL
  Filled 2018-06-30 (×2): qty 1

## 2018-06-30 MED ORDER — CLONIDINE HCL 0.3 MG/24HR TD PTWK: 0.3000 mg | MEDICATED_PATCH | TRANSDERMAL | Status: DC

## 2018-06-30 NOTE — Plan of Care (Signed)
Pt is d/ced home. Will go home with home health - [facility].  Has been transitioned back to [place].  Pt's son with whom she lives says that she hasn't had a BM in 2 weeks.  Apparently she has been taking 4-5 Immodium so she won't have a BM at church.  Educated pt not to do this.  Told her it's dangerous to take medicine when not warranted.  We gave senokot and miralax.  Holding the enema bc pt can't retain liquid.  Son states that she has had poor appetite past few weeks.  SWOT nurse removed IVs.  Pt will transport home via EMS.  Very supportive family.

## 2018-06-30 NOTE — Discharge Instructions (Signed)
Resume diet and activity as before ° ° °

## 2018-06-30 NOTE — Progress Notes (Signed)
Advance care planning  Purpose of Encounter COPD, chf  Parties in Attendance Patient, son  Patients Decisional capacity Patient is alert and oriented.  Has documented healthcare power of attorney her son.  No advance directives in place.  Discussed regarding patient's admission with COPD, CHF.  Prognosis and treatment plan discussed.  CODE STATUS discussed.  Patient wants to be a full code.  Time spent - 17 minutes

## 2018-06-30 NOTE — Care Management (Addendum)
Shannon.  Update sent to [facility], [facility] representative. [facility] unable to take case. Telephone call to [place] at [facility]. Will be able to accept case Family updated Shelbie Ammons RN MSN [place] Management [phone]

## 2018-06-30 NOTE — Care Management (Signed)
Discharge to home today per Dr. Darvin Neighbours. Query for Home Health compare information completed. Will  give one to son, [name]. One placed in chart.  Will need to discuss agency preference with [name]. to son, [name].  Transportation will be arranged per [employer]. Shelbie Ammons RN MSN [facility] Care Management [phone]

## 2018-07-01 ENCOUNTER — Telehealth: Payer: Self-pay

## 2018-07-01 NOTE — Telephone Encounter (Signed)
LM for on VM for patient to schedule HFU apt with Dr. Ashby Dawes.

## 2018-07-05 ENCOUNTER — Telehealth: Payer: Self-pay

## 2018-07-05 NOTE — Telephone Encounter (Signed)
LM for patient to sch HFU apt.

## 2018-07-09 ENCOUNTER — Telehealth: Payer: Self-pay

## 2018-07-09 NOTE — Telephone Encounter (Signed)
LM with son Pilar Plate to call to sch HFU apt.

## 2018-07-11 NOTE — Discharge Summary (Signed)
[facility] NAME: Veronica Anderson    MR#:  [id number]  DATE OF BIRTH:  10/17/1945  DATE OF ADMISSION:  06/28/2018 ADMITTING PHYSICIAN: Harrie Foreman, MD  DATE OF DISCHARGE: 06/30/2018  3:58 PM  PRIMARY CARE PHYSICIAN: Tracie Harrier, MD   ADMISSION DIAGNOSIS:  Acute respiratory failure with hypoxia ([place]) [J96.01]  DISCHARGE DIAGNOSIS:  Active Problems:   Acute respiratory failure with hypoxemia ([place])   SECONDARY DIAGNOSIS:   Past Medical History:  Diagnosis Date  . Allergy   . Anemia   . Asthma   . Cataract    bilateral  . CHF (NYHA class II, ACC/AHA stage C) ([place])   . Chronic kidney disease    stage IV (severe), Dr. Aleene Davidson  . Constipation   . Diabetes mellitus without complication ([place])   . Edema    feet/ankles  . GERD (gastroesophageal reflux disease)   . Hyperlipidemia   . Hypertension   . Left ankle pain   . Lumbago   . Myocardial infarction ([place])   . Neuropathy   . Neuropathy   . OA (osteoarthritis)   . Obesity   . Obstructive sleep apnea syndrome    CPAP  . Paresthesia    Foot  . Protein calorie malnutrition ([place])   . Requires supplemental oxygen   . Stroke ([place])   . Vitamin D deficiency      ADMITTING HISTORY  Chief Complaint: Respiratory distress HPI: The patient with past medical history of COPD, OSA, hypertension, diastolic CHF, diabetes and hyperlipidemia presents to the emergency with shortness of breath.  The patient's family states that she has appeared more labored with her breathing throughout the day.  Seen in the emergency department 2 days ago and diagnosed with COPD exacerbation.  She has been taking doxycycline as well as her prednisone but has not improved.  Due to work of breathing as well as hypoxemia in the emergency department staff initiated BiPAP.  Once patient was stabilized to call the hospitalist service for admission.  HOSPITAL COURSE:   This is a 72 year old female  admitted for respiratory failure.  * Acute on chronic hypoxic respiratory failure secondary to combination of COPD and CHF.  Was initially on BiPAP and later switched to nasal cannula oxygen. Much improved and back to baseline. Needs follow-up with primary care physician.  *COPD exacerbation. Treated with IV steroids, nebulizers and inhalers.  Seen by pulmonary.  Improved well.  Will be changed to oral prednisone at discharge.  *Acute on chronic diastolic congestive heart failure.   Diuresed well with IV Lasix.  Euvolemic by the time of discharge.  Resume home dose of Lasix.  *CKD stage III is stable  *Diabetes mellitus Uncontrolled with hyperglycemia in the hospital due to steroids.  Her Lantus dose was increased.  Pre-meal NovoLog added.  At time of discharge patient will be back on her home regimen.  *Hypertension: Controlled continue clonidine and amlodipine  Stable for discharge home with home health services.  CONSULTS OBTAINED:    DRUG ALLERGIES:   Allergies  Allergen Reactions  . Ace Inhibitors Cough    DISCHARGE MEDICATIONS:   Allergies as of 06/30/2018      Reactions   Ace Inhibitors Cough      Medication List    TAKE these medications   amLODipine 10 MG tablet Commonly known as:  NORVASC Take 1 tablet (10 mg total) by mouth daily.   aspirin EC 81 MG tablet Take 81  mg by mouth at bedtime.   cloNIDine 0.3 mg/24hr patch Commonly known as:  CATAPRES - Dosed in mg/24 hr PLACE 1 PATCH (0.3 MG TOTAL) ONTO THE SKIN ONCE A WEEK. What changed:  when to take this   desonide 0.05 % ointment Commonly known as:  DESOWEN Apply 1 application topically 2 (two) times daily.   ferrous sulfate 325 (65 FE) MG tablet Take 1 tablet (325 mg total) by mouth 2 (two) times daily with a meal.   furosemide 40 MG tablet Commonly known as:  LASIX Take 1 tablet (40 mg total) by mouth daily.   gabapentin 300 MG capsule Commonly known as:  NEURONTIN TAKE 1 CAPSULE BY  MOUTH THREE TIMES A DAY What changed:  See the new instructions.   glucose blood test strip Commonly known as:  ACCU-CHEK AVIVA PLUS 1 each by Other route 2 (two) times daily. Use as instructed   glucose blood test strip Commonly known as:  ACCU-CHEK AVIVA PLUS CHECK BLOOD SUGAR TWICE DAILY   Insulin Glargine 100 UNIT/ML Solostar Pen Commonly known as:  LANTUS SOLOSTAR INJECT 35 UNITS SUBCUTANEOUSLY DAILY What changed:    how much to take  how to take this  when to take this  additional instructions   insulin lispro 100 UNIT/ML KiwkPen Commonly known as:  HUMALOG KWIKPEN INJECT 5 UNITS SUBCUTANEOUSLY BEFORE MEALS; Dx E11.40, LON 99 months   Insulin Pen Needle 32G X 6 MM Misc Commonly known as:  NOVOFINE Inject 1 application into the skin 4 (four) times daily.   medroxyPROGESTERone 10 MG tablet Commonly known as:  PROVERA Take 2 tablets (20 mg total) by mouth daily.   montelukast 10 MG tablet Commonly known as:  SINGULAIR Take 1 tablet (10 mg total) by mouth daily.   nystatin powder Commonly known as:  nystatin Apply 1 g topically 3 (three) times daily.   pravastatin 40 MG tablet Commonly known as:  PRAVACHOL TAKE 1 TABLET BY MOUTH EVERY DAY   predniSONE 20 MG tablet Commonly known as:  DELTASONE Take 3 tablets (60 mg total) by mouth daily.   ranitidine 150 MG tablet Commonly known as:  ZANTAC Take 1 tablet (150 mg total) by mouth 2 (two) times daily. Trying to wean off Omeprazole   senna-docusate 8.6-50 MG tablet Commonly known as:  Senokot-S Take 2 tablets by mouth daily as needed for mild constipation.   Vitamin D (Ergocalciferol) 1.25 MG (50000 UT) Caps capsule Commonly known as:  DRISDOL TAKE ONE CAPSULE BY MOUTH ONCE A WEEK On Sunday What changed:    how much to take  how to take this  when to take this  additional instructions     ASK your doctor about these medications   doxycycline 50 MG capsule Commonly known as:  VIBRAMYCIN Take 2  capsules (100 mg total) by mouth 2 (two) times daily for 7 days. Ask about: Should I take this medication?       Today   VITAL SIGNS:  Blood pressure (!) 160/69, pulse 96, temperature 98.6 F (37 C), resp. rate 20, height 5\' 4"  (1.626 m), weight 131.2 kg, SpO2 98 %.  I/O:  No intake or output data in the 24 hours ending 07/11/18 1310  PHYSICAL EXAMINATION:  Physical Exam  GENERAL:  72 y.o.-year-old patient lying in the bed with no acute distress.  LUNGS: Normal breath sounds bilaterally, no wheezing, rales,rhonchi or crepitation. No use of accessory muscles of respiration.  CARDIOVASCULAR: S1, S2 normal. No murmurs, rubs, or  gallops.  ABDOMEN: Soft, non-tender, non-distended. Bowel sounds present. No organomegaly or mass.  NEUROLOGIC: Moves all 4 extremities. PSYCHIATRIC: The patient is alert and oriented x 3.  SKIN: No obvious rash, lesion, or ulcer.   DATA REVIEW:   CBC No results for input(s): WBC, HGB, HCT, PLT in the last 168 hours.  Chemistries  No results for input(s): NA, K, CL, CO2, GLUCOSE, BUN, CREATININE, CALCIUM, MG, AST, ALT, ALKPHOS, BILITOT in the last 168 hours.  Invalid input(s): GFRCGP  Cardiac Enzymes No results for input(s): TROPONINI in the last 168 hours.  Microbiology Results  Results for orders placed or performed during the hospital encounter of 06/28/18  MRSA PCR Screening     Status: None   Collection Time: 06/28/18  5:58 AM  Result Value Ref Range Status   MRSA by PCR NEGATIVE NEGATIVE Final    Comment:        The GeneXpert MRSA Assay (FDA approved for NASAL specimens only), is one component of a comprehensive MRSA colonization surveillance program. It is not intended to diagnose MRSA infection nor to guide or monitor treatment for MRSA infections. Performed at [facility], [address] [place], [place] [address]     RADIOLOGY:  No results found.  Follow up with PCP in 1 week.  Management plans discussed with  the patient, family and they are in agreement.  CODE STATUS:  Code Status History    Date Active Date Inactive Code Status Order ID Comments User Context   06/28/2018 0551 06/30/2018 1904 Full Code [id number]  Harrie Foreman, MD Inpatient   09/11/2017 0155 09/15/2017 2145 Full Code [id number]  Amelia Jo, MD Inpatient   04/19/2017 0813 05/05/2017 1657 Full Code [id number]  Hillary Bow, MD ED   11/07/2016 0902 11/10/2016 2116 Full Code [id number]  Hillary Bow, MD ED   03/27/2015 1941 03/31/2015 1534 Full Code [id number]  Dustin Flock, MD ED      TOTAL TIME TAKING CARE OF THIS PATIENT ON DAY OF DISCHARGE: more than 30 minutes.   Leia Alf Joyel Chenette M.D on 07/11/2018 at 1:10 PM  Between 7am to 6pm - Pager - [phone]  After 6pm go to [email or web address] - password [email or web address] [facility]  Office  [phone]  CC: Primary care physician; Tracie Harrier, MD  Note: This dictation was prepared with Dragon dictation along with smaller phrase technology. Any transcriptional errors that result from this process are unintentional.

## 2018-07-28 ENCOUNTER — Telehealth: Payer: Self-pay

## 2018-07-28 NOTE — Telephone Encounter (Signed)
Fax was received about recall. I called patient to inform her that there has been a recall on Accu-Chek Aviva Plus test strips. Spoke with her son he acknowledged understanding. I told him to contact [employer] if he has any questions and for information about replacements. [phone].

## 2018-09-24 IMAGING — CT CT CHEST W/O CM
2 of 3 series · 15 of 36 positions shown, 18 images · non-contrast
Comparison: Chest radiographs dated 04/23/2017

CLINICAL DATA: Respiratory failure

EXAM:
CT CHEST WITHOUT CONTRAST
TECHNIQUE: Multidetector CT imaging of the chest was performed following the
standard protocol without IV contrast.

[Series 2: thorax · axial · 0.83mm/px · z∈[-718,-470]mm · 12 of 146 slices shown, 15 images]
[im 11/146  mediastinal]
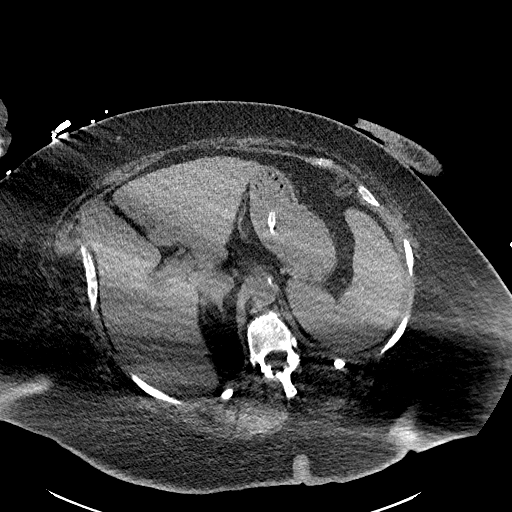
[im 11/146  lung]
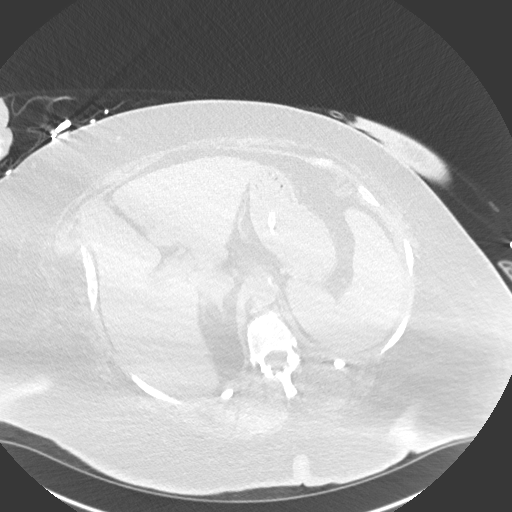
[im 22/146  lung]
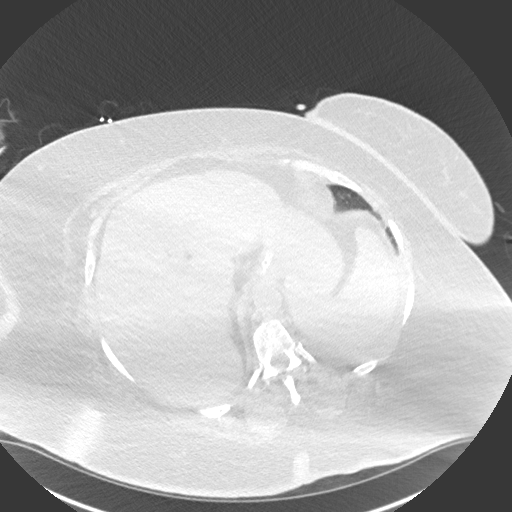
[im 33/146  lung]
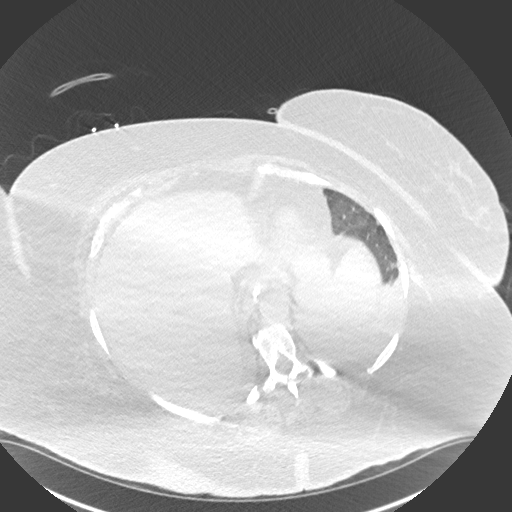
[im 43/146  lung]
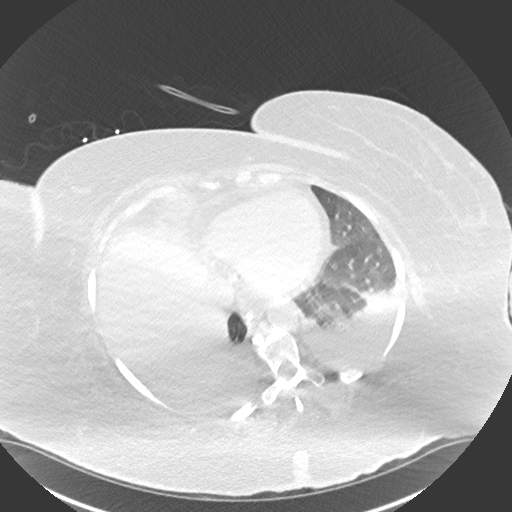
[im 54/146  mediastinal]
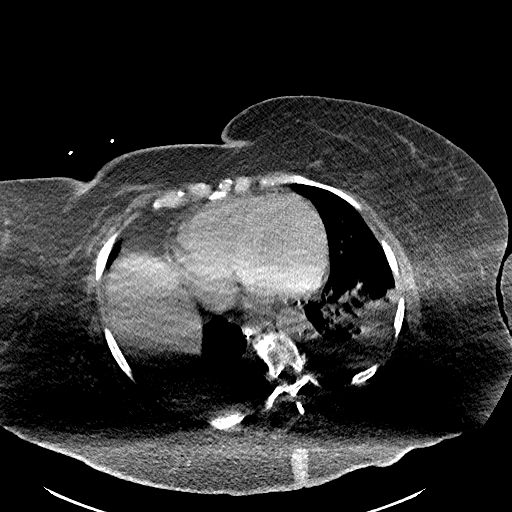
[im 54/146  lung]
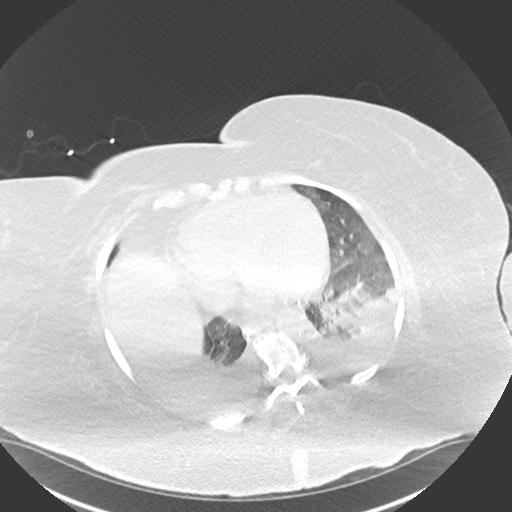
[im 65/146  lung]
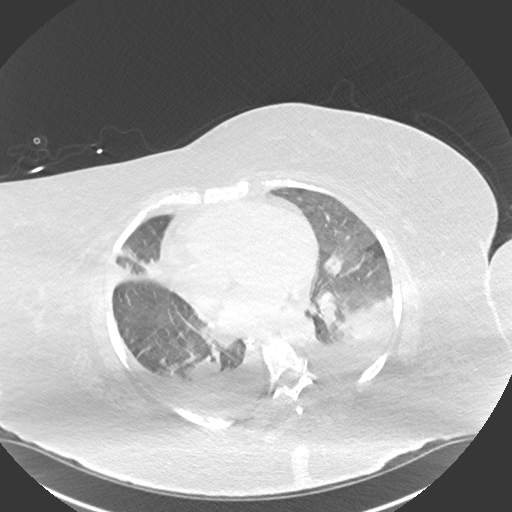
[im 81/146  lung]
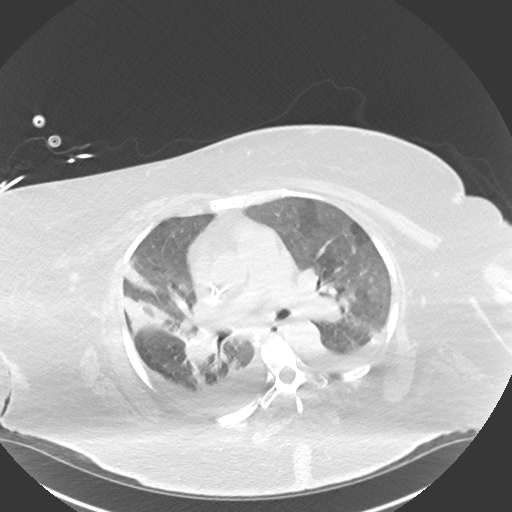
[im 92/146  lung]
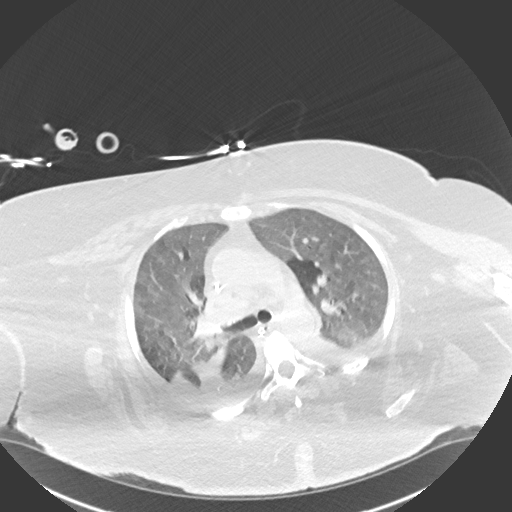
[im 103/146  mediastinal]
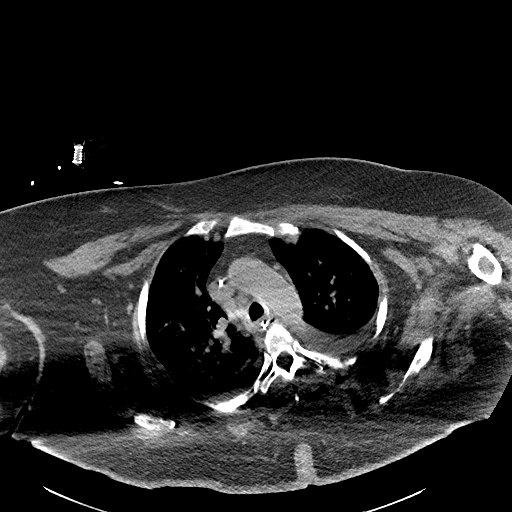
[im 103/146  lung]
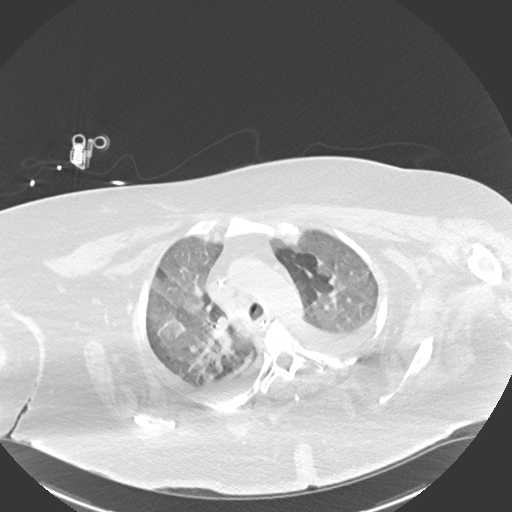
[im 113/146  lung]
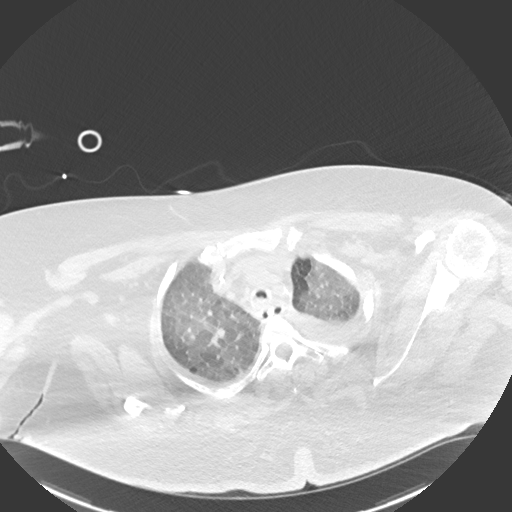
[im 124/146  lung]
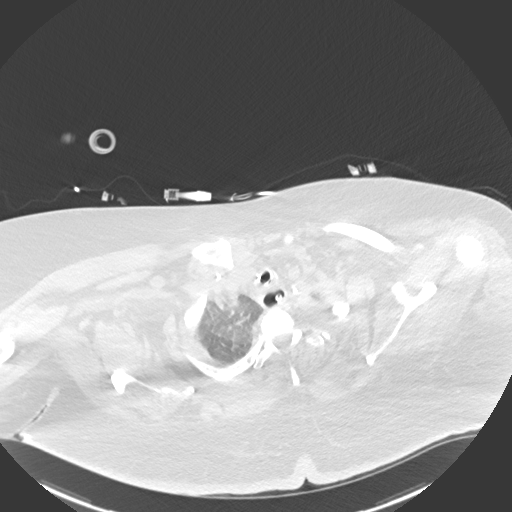
[im 135/146  lung]
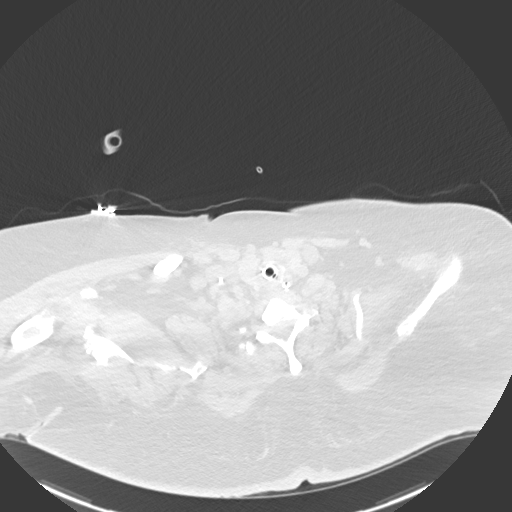

[Series 5: coronal · coronal · 0.59mm/px · 3 of 165 slices shown]
[im 33/165  lung]
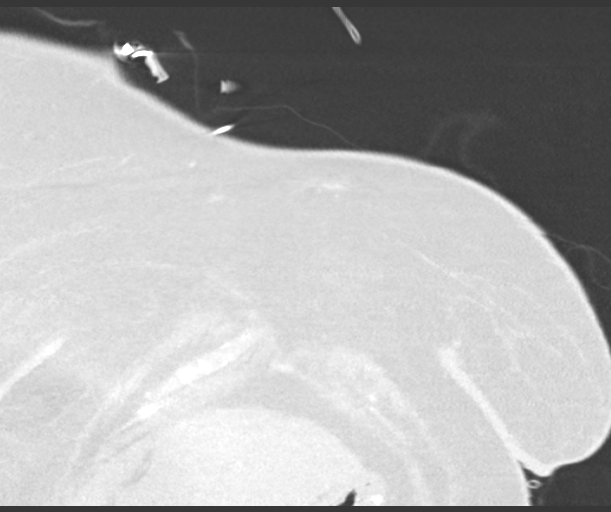
[im 66/165  lung]
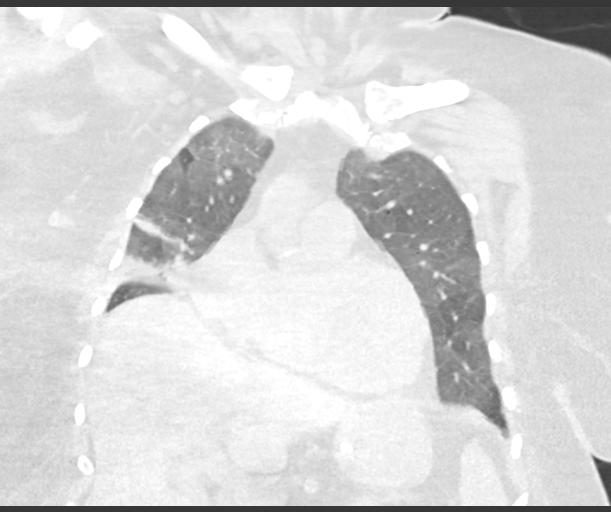
[im 99/165  lung]
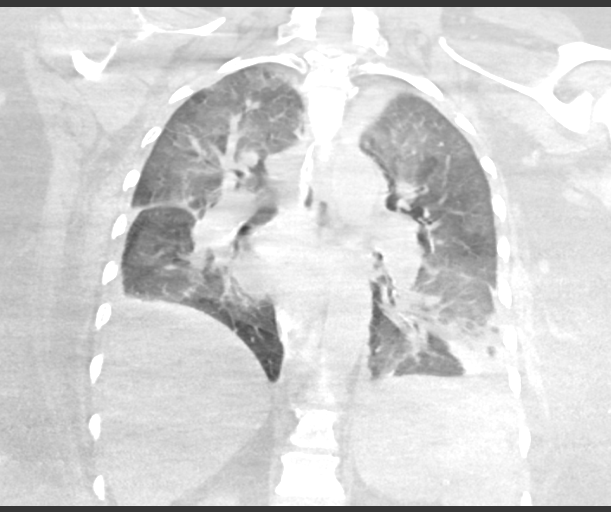

[15 of 36 positions shown; findings below may reference images not displayed]

FINDINGS: Cardiovascular: The heart is normal in size. No pericardial
effusion.

No evidence of thoracic aortic aneurysm. Atherosclerotic
calcifications aortic arch.

Three vessel coronary atherosclerosis.

Right chest port terminating at the cavoatrial junction.

Mediastinum/Nodes: No suspicious mediastinal lymphadenopathy.

Visualized thyroid is unremarkable.

Lungs/Pleura: Endotracheal tube terminates 6 cm above the carina.

Patchy left lower lobe opacity (series 3/image 97), suspicious for
pneumonia. Additional mild patchy opacity in the posterior right
upper lobe and right middle lobe, atelectasis versus pneumonia.

Mosaic attenuation, suggesting air trapping or small airways
disease.

Moderate bilateral pleural effusions.  No pneumothorax.

Upper Abdomen: Visualized upper abdomen is grossly unremarkable,
noting an enteric tube coursing into the mid stomach.

Musculoskeletal: Mild degenerative changes of the visualized
thoracolumbar spine.
IMPRESSION: Patchy left lower lobe opacity, suspicious for pneumonia. Additional
mild patchy opacity in the posterior right upper lobe and right
middle lobe, atelectasis versus pneumonia.

Moderate bilateral pleural effusions.

Endotracheal tube terminates 6 cm above the carina.

Aortic Atherosclerosis ([id number].T).

## 2018-09-26 IMAGING — DX DG CHEST 1V PORT
1 series · 1 of 1 positions shown · non-contrast
Comparison: 04/25/2017

CLINICAL DATA: Possible aspiration.

EXAM:
PORTABLE CHEST 1 VIEW

[chest ap]
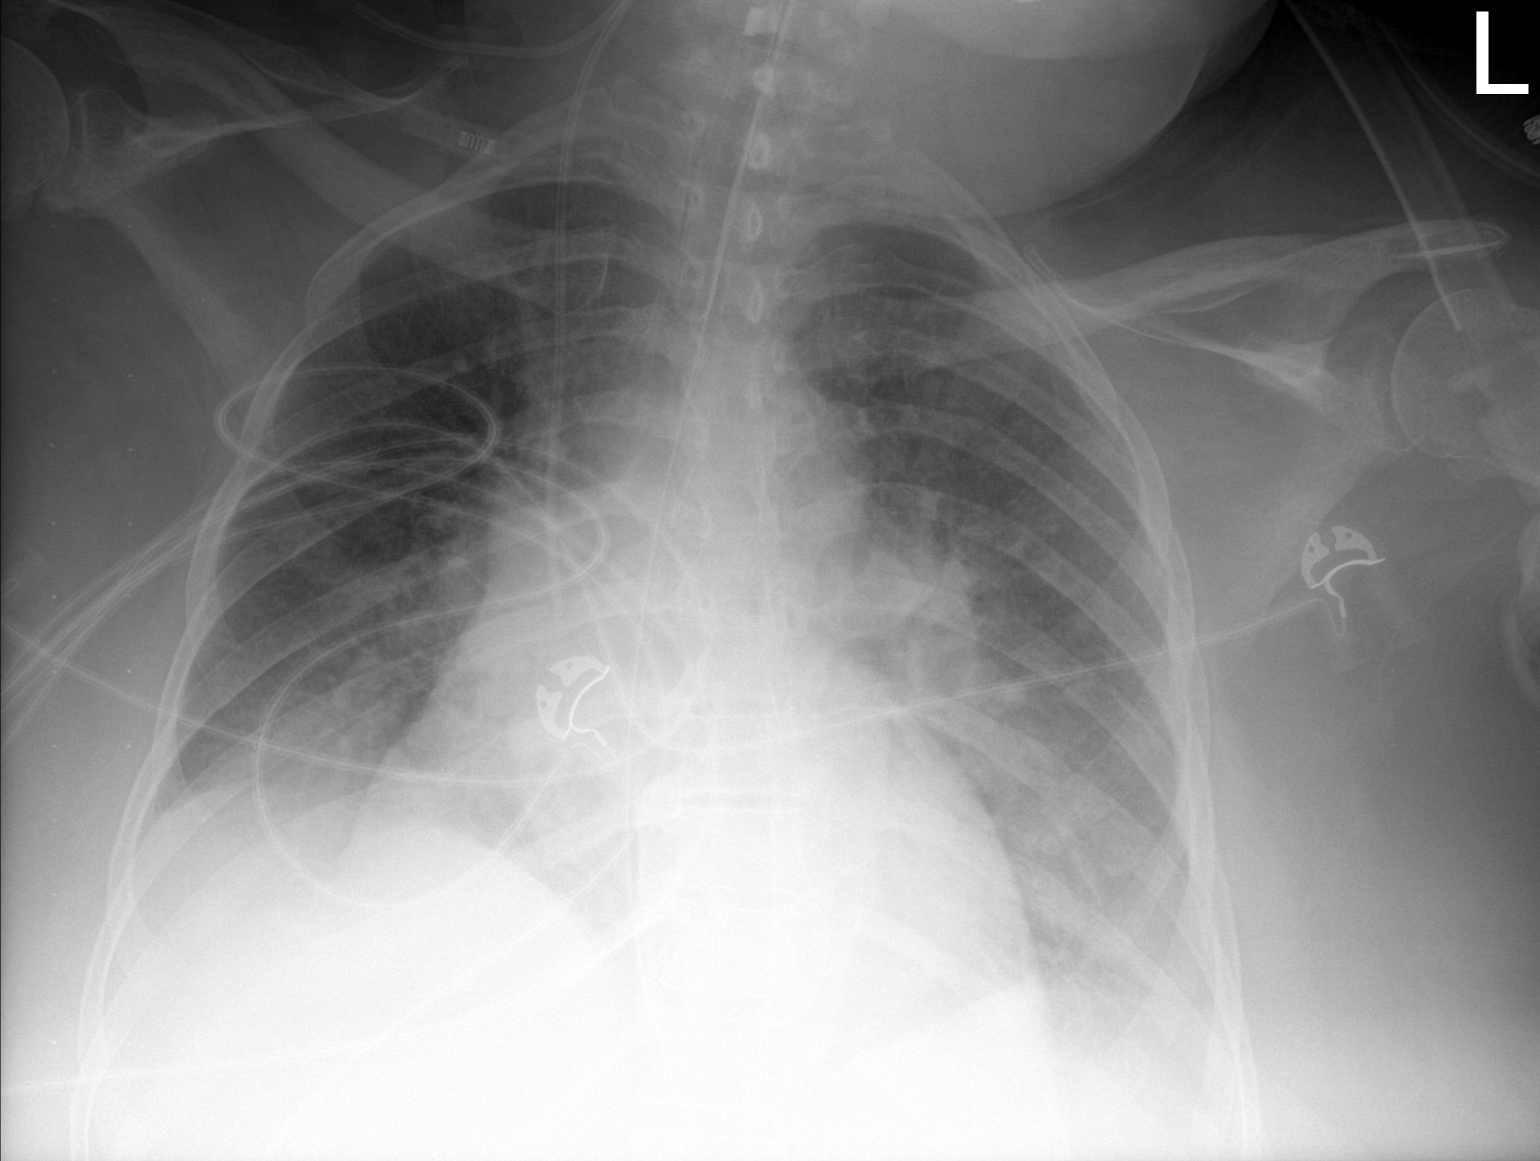

[1 of 1 positions shown; findings below may reference images not displayed]

FINDINGS: Endotracheal tube with tip measuring 6.7 cm above the carina.
Enteric tube tip is off the field of view but below the left
hemidiaphragm. Right central venous catheter with tip over the low
SVC region. The patient is rotated, limiting the examination.
Shallow inspiration. Cardiac enlargement without vascular
congestion. Bilateral perihilar and basilar airspace disease may
represent edema or infiltration. Can't exclude aspiration in the
appropriate clinical setting. The opacities are improving since
previous study. No blunting of costophrenic angles. No pneumothorax.
IMPRESSION: Appliances appear in satisfactory location. Shallow inspiration.
Infiltration or atelectasis in the perihilar and basilar regions
bilaterally, improved since previous study. Cardiac enlargement.

## 2018-09-29 ENCOUNTER — Other Ambulatory Visit: Payer: Self-pay

## 2018-09-29 DIAGNOSIS — Z794 Long term (current) use of insulin: Principal | ICD-10-CM

## 2018-09-29 DIAGNOSIS — E1122 Type 2 diabetes mellitus with diabetic chronic kidney disease: Secondary | ICD-10-CM

## 2018-09-29 DIAGNOSIS — N184 Chronic kidney disease, stage 4 (severe): Principal | ICD-10-CM

## 2018-09-29 NOTE — Telephone Encounter (Signed)
Refill request was sent to Fredderick Severance, DNP, for approval and submission.

## 2018-10-11 ENCOUNTER — Other Ambulatory Visit: Payer: Self-pay | Admitting: Nurse Practitioner

## 2018-10-11 DIAGNOSIS — E1122 Type 2 diabetes mellitus with diabetic chronic kidney disease: Secondary | ICD-10-CM

## 2018-10-11 DIAGNOSIS — N184 Chronic kidney disease, stage 4 (severe): Principal | ICD-10-CM

## 2018-10-11 DIAGNOSIS — Z794 Long term (current) use of insulin: Principal | ICD-10-CM

## 2019-06-15 ENCOUNTER — Other Ambulatory Visit: Payer: Self-pay | Admitting: Internal Medicine

## 2019-06-15 DIAGNOSIS — Z1231 Encounter for screening mammogram for malignant neoplasm of breast: Secondary | ICD-10-CM

## 2020-10-15 ENCOUNTER — Telehealth: Payer: Self-pay | Admitting: Family Medicine

## 2020-10-15 NOTE — Telephone Encounter (Signed)
Pt is homebound and needs a Flu shot.

## 2020-10-16 ENCOUNTER — Other Ambulatory Visit: Payer: Self-pay

## 2020-10-16 ENCOUNTER — Ambulatory Visit (LOCAL_COMMUNITY_HEALTH_CENTER): Payer: Medicare Other

## 2020-10-16 NOTE — Telephone Encounter (Signed)
Discussed pt request with Ayo. Judene Companion will call pt and discuss details about a home visit.

## 2020-10-16 NOTE — Progress Notes (Signed)
75 year old homebound patient requesting a flu vaccine.  Home visit provided to the patient.  Patient tolerated the vaccine well. Gregary Cromer, RN

## 2021-05-25 ENCOUNTER — Inpatient Hospital Stay: Payer: Medicare Other

## 2021-05-25 ENCOUNTER — Encounter: Payer: Self-pay | Admitting: Pulmonary Disease

## 2021-05-25 ENCOUNTER — Inpatient Hospital Stay
Admission: EM | Admit: 2021-05-25 | Discharge: 2021-06-02 | DRG: 871 | Disposition: A | Discharge status: Home / Self Care | Payer: Medicare Other | Attending: Internal Medicine | Admitting: Internal Medicine

## 2021-05-25 ENCOUNTER — Emergency Department: Payer: Medicare Other

## 2021-05-25 DIAGNOSIS — I69391 Dysphagia following cerebral infarction: Secondary | ICD-10-CM

## 2021-05-25 DIAGNOSIS — K219 Gastro-esophageal reflux disease without esophagitis: Secondary | ICD-10-CM | POA: Diagnosis present

## 2021-05-25 DIAGNOSIS — Z20822 Contact with and (suspected) exposure to covid-19: Secondary | ICD-10-CM | POA: Diagnosis present

## 2021-05-25 DIAGNOSIS — I69351 Hemiplegia and hemiparesis following cerebral infarction affecting right dominant side: Secondary | ICD-10-CM

## 2021-05-25 DIAGNOSIS — N179 Acute kidney failure, unspecified: Secondary | ICD-10-CM | POA: Diagnosis not present

## 2021-05-25 DIAGNOSIS — I2510 Atherosclerotic heart disease of native coronary artery without angina pectoris: Secondary | ICD-10-CM | POA: Diagnosis present

## 2021-05-25 DIAGNOSIS — L89159 Pressure ulcer of sacral region, unspecified stage: Secondary | ICD-10-CM | POA: Diagnosis present

## 2021-05-25 DIAGNOSIS — I110 Hypertensive heart disease with heart failure: Secondary | ICD-10-CM | POA: Diagnosis present

## 2021-05-25 DIAGNOSIS — Z833 Family history of diabetes mellitus: Secondary | ICD-10-CM

## 2021-05-25 DIAGNOSIS — J9601 Acute respiratory failure with hypoxia: Secondary | ICD-10-CM

## 2021-05-25 DIAGNOSIS — D631 Anemia in chronic kidney disease: Secondary | ICD-10-CM | POA: Diagnosis present

## 2021-05-25 DIAGNOSIS — J9621 Acute and chronic respiratory failure with hypoxia: Secondary | ICD-10-CM | POA: Diagnosis present

## 2021-05-25 DIAGNOSIS — E440 Moderate protein-calorie malnutrition: Secondary | ICD-10-CM | POA: Diagnosis present

## 2021-05-25 DIAGNOSIS — D6959 Other secondary thrombocytopenia: Secondary | ICD-10-CM | POA: Diagnosis present

## 2021-05-25 DIAGNOSIS — E870 Hyperosmolality and hypernatremia: Secondary | ICD-10-CM | POA: Diagnosis not present

## 2021-05-25 DIAGNOSIS — I252 Old myocardial infarction: Secondary | ICD-10-CM

## 2021-05-25 DIAGNOSIS — I5032 Chronic diastolic (congestive) heart failure: Secondary | ICD-10-CM | POA: Diagnosis present

## 2021-05-25 DIAGNOSIS — Z87891 Personal history of nicotine dependence: Secondary | ICD-10-CM

## 2021-05-25 DIAGNOSIS — N183 Chronic kidney disease, stage 3 unspecified: Secondary | ICD-10-CM

## 2021-05-25 DIAGNOSIS — A419 Sepsis, unspecified organism: Principal | ICD-10-CM | POA: Diagnosis present

## 2021-05-25 DIAGNOSIS — E872 Acidosis, unspecified: Secondary | ICD-10-CM | POA: Diagnosis present

## 2021-05-25 DIAGNOSIS — Z7401 Bed confinement status: Secondary | ICD-10-CM

## 2021-05-25 DIAGNOSIS — E1122 Type 2 diabetes mellitus with diabetic chronic kidney disease: Secondary | ICD-10-CM | POA: Diagnosis present

## 2021-05-25 DIAGNOSIS — E785 Hyperlipidemia, unspecified: Secondary | ICD-10-CM

## 2021-05-25 DIAGNOSIS — J690 Pneumonitis due to inhalation of food and vomit: Secondary | ICD-10-CM | POA: Diagnosis present

## 2021-05-25 DIAGNOSIS — R627 Adult failure to thrive: Secondary | ICD-10-CM | POA: Diagnosis present

## 2021-05-25 DIAGNOSIS — Z794 Long term (current) use of insulin: Secondary | ICD-10-CM

## 2021-05-25 DIAGNOSIS — Z9981 Dependence on supplemental oxygen: Secondary | ICD-10-CM

## 2021-05-25 DIAGNOSIS — G9341 Metabolic encephalopathy: Secondary | ICD-10-CM | POA: Diagnosis present

## 2021-05-25 DIAGNOSIS — Z6832 Body mass index (BMI) 32.0-32.9, adult: Secondary | ICD-10-CM

## 2021-05-25 DIAGNOSIS — E669 Obesity, unspecified: Secondary | ICD-10-CM | POA: Diagnosis present

## 2021-05-25 DIAGNOSIS — Z7982 Long term (current) use of aspirin: Secondary | ICD-10-CM

## 2021-05-25 DIAGNOSIS — I5030 Unspecified diastolic (congestive) heart failure: Secondary | ICD-10-CM

## 2021-05-25 DIAGNOSIS — D696 Thrombocytopenia, unspecified: Secondary | ICD-10-CM

## 2021-05-25 DIAGNOSIS — E1140 Type 2 diabetes mellitus with diabetic neuropathy, unspecified: Secondary | ICD-10-CM | POA: Diagnosis present

## 2021-05-25 DIAGNOSIS — K5900 Constipation, unspecified: Secondary | ICD-10-CM | POA: Diagnosis present

## 2021-05-25 DIAGNOSIS — L89152 Pressure ulcer of sacral region, stage 2: Secondary | ICD-10-CM | POA: Diagnosis not present

## 2021-05-25 DIAGNOSIS — E876 Hypokalemia: Secondary | ICD-10-CM | POA: Diagnosis present

## 2021-05-25 DIAGNOSIS — K8020 Calculus of gallbladder without cholecystitis without obstruction: Secondary | ICD-10-CM

## 2021-05-25 DIAGNOSIS — I4891 Unspecified atrial fibrillation: Secondary | ICD-10-CM | POA: Diagnosis present

## 2021-05-25 DIAGNOSIS — E1142 Type 2 diabetes mellitus with diabetic polyneuropathy: Secondary | ICD-10-CM | POA: Diagnosis present

## 2021-05-25 DIAGNOSIS — R1310 Dysphagia, unspecified: Secondary | ICD-10-CM | POA: Diagnosis present

## 2021-05-25 DIAGNOSIS — G4733 Obstructive sleep apnea (adult) (pediatric): Secondary | ICD-10-CM | POA: Diagnosis present

## 2021-05-25 DIAGNOSIS — E559 Vitamin D deficiency, unspecified: Secondary | ICD-10-CM | POA: Diagnosis present

## 2021-05-25 DIAGNOSIS — G4700 Insomnia, unspecified: Secondary | ICD-10-CM | POA: Diagnosis present

## 2021-05-25 DIAGNOSIS — Z79899 Other long term (current) drug therapy: Secondary | ICD-10-CM

## 2021-05-25 DIAGNOSIS — R6521 Severe sepsis with septic shock: Secondary | ICD-10-CM | POA: Diagnosis present

## 2021-05-25 DIAGNOSIS — J4520 Mild intermittent asthma, uncomplicated: Secondary | ICD-10-CM | POA: Diagnosis present

## 2021-05-25 DIAGNOSIS — K047 Periapical abscess without sinus: Secondary | ICD-10-CM | POA: Diagnosis present

## 2021-05-25 DIAGNOSIS — I129 Hypertensive chronic kidney disease with stage 1 through stage 4 chronic kidney disease, or unspecified chronic kidney disease: Secondary | ICD-10-CM

## 2021-05-25 LAB — URINALYSIS, ROUTINE W REFLEX MICROSCOPIC
Glucose, UA: NEGATIVE mg/dL
Ketones, ur: NEGATIVE mg/dL
Nitrite: NEGATIVE
Protein, ur: NEGATIVE mg/dL
Specific Gravity, Urine: >1.03 — ABNORMAL HIGH (ref 1.005–1.030)
pH: 5 (ref 5.0–8.0)

## 2021-05-25 LAB — CBC WITH DIFFERENTIAL/PLATELET
Abs Immature Granulocytes: 0.05 10*3/uL (ref 0.00–0.07)
Basophils Absolute: 0 10*3/uL (ref 0.0–0.1)
Basophils Relative: 0 %
Eosinophils Absolute: 0 10*3/uL (ref 0.0–0.5)
Eosinophils Relative: 0 %
HCT: 32.7 % — ABNORMAL LOW (ref 36.0–46.0)
Hemoglobin: 10.2 g/dL — ABNORMAL LOW (ref 12.0–15.0)
Immature Granulocytes: 1 %
Lymphocytes Relative: 17 %
Lymphs Abs: 1.3 10*3/uL (ref 0.7–4.0)
MCH: 25.2 pg — ABNORMAL LOW (ref 26.0–34.0)
MCHC: 31.2 g/dL (ref 30.0–36.0)
MCV: 80.9 fL (ref 80.0–100.0)
Monocytes Absolute: 0.5 10*3/uL (ref 0.1–1.0)
Monocytes Relative: 7 %
Neutro Abs: 5.8 10*3/uL (ref 1.7–7.7)
Neutrophils Relative %: 75 %
Platelets: 307 10*3/uL (ref 150–400)
RBC: 4.04 MIL/uL (ref 3.87–5.11)
RDW: 20.9 % — ABNORMAL HIGH (ref 11.5–15.5)
WBC: 7.7 10*3/uL (ref 4.0–10.5)
nRBC: 0 % (ref 0.0–0.2)

## 2021-05-25 LAB — RESP PANEL BY RT-PCR (FLU A&B, COVID) ARPGX2
Influenza A by PCR: NEGATIVE
Influenza B by PCR: NEGATIVE
SARS Coronavirus 2 by RT PCR: NEGATIVE

## 2021-05-25 LAB — BLOOD GAS, ARTERIAL
Acid-base deficit: 5.3 mmol/L — ABNORMAL HIGH (ref 0.0–2.0)
Bicarbonate: 20.6 mmol/L (ref 20.0–28.0)
Expiratory PAP: 8
FIO2: 1
O2 Saturation: 93.7 %
Patient temperature: 37
pCO2 arterial: 41 mmHg (ref 32.0–48.0)
pH, Arterial: 7.31 — ABNORMAL LOW (ref 7.350–7.450)
pO2, Arterial: 76 mmHg — ABNORMAL LOW (ref 83.0–108.0)

## 2021-05-25 LAB — URINALYSIS, MICROSCOPIC (REFLEX)

## 2021-05-25 LAB — BASIC METABOLIC PANEL
Anion gap: 8 (ref 5–15)
Anion gap: 15 (ref 5–15)
BUN: 18 mg/dL (ref 8–23)
BUN: 20 mg/dL (ref 8–23)
CO2: 28 mmol/L (ref 22–32)
CO2: 19 mmol/L — ABNORMAL LOW (ref 22–32)
Calcium: 8.4 mg/dL — ABNORMAL LOW (ref 8.9–10.3)
Calcium: 8.2 mg/dL — ABNORMAL LOW (ref 8.9–10.3)
Chloride: 107 mmol/L (ref 98–111)
Chloride: 112 mmol/L — ABNORMAL HIGH (ref 98–111)
Creatinine, Ser: 0.83 mg/dL (ref 0.44–1.00)
Creatinine, Ser: 1.3 mg/dL — ABNORMAL HIGH (ref 0.44–1.00)
GFR, Estimated: >60 mL/min (ref >60)
GFR, Estimated: 43 mL/min — ABNORMAL LOW (ref >60)
Glucose, Bld: 215 mg/dL — ABNORMAL HIGH (ref 70–99)
Glucose, Bld: 224 mg/dL — ABNORMAL HIGH (ref 70–99)
Potassium: 3.6 mmol/L (ref 3.5–5.1)
Potassium: 3.1 mmol/L — ABNORMAL LOW (ref 3.5–5.1)
Sodium: 143 mmol/L (ref 135–145)
Sodium: 146 mmol/L — ABNORMAL HIGH (ref 135–145)

## 2021-05-25 LAB — BLOOD GAS, VENOUS
Acid-base deficit: 5 mmol/L — ABNORMAL HIGH (ref 0.0–2.0)
Bicarbonate: 19.7 mmol/L — ABNORMAL LOW (ref 20.0–28.0)
O2 Saturation: 74.4 %
Patient temperature: 37
pCO2, Ven: 34 mmHg — ABNORMAL LOW (ref 44.0–60.0)
pH, Ven: 7.37 (ref 7.250–7.430)
pO2, Ven: 41 mmHg (ref 32.0–45.0)

## 2021-05-25 LAB — TSH: TSH: 4.475 u[IU]/mL (ref 0.350–4.500)

## 2021-05-25 LAB — BRAIN NATRIURETIC PEPTIDE: B Natriuretic Peptide: 103.1 pg/mL — ABNORMAL HIGH (ref 0.0–100.0)

## 2021-05-25 LAB — APTT: aPTT: 20 seconds — ABNORMAL LOW (ref 24–36)

## 2021-05-25 LAB — PROTIME-INR
INR: 1.1 (ref 0.8–1.2)
Prothrombin Time: 14.3 seconds (ref 11.4–15.2)

## 2021-05-25 LAB — MAGNESIUM: Magnesium: 2.1 mg/dL (ref 1.7–2.4)

## 2021-05-25 LAB — PHOSPHORUS: Phosphorus: 4.7 mg/dL — ABNORMAL HIGH (ref 2.5–4.6)

## 2021-05-25 LAB — LACTIC ACID, PLASMA
Lactic Acid, Venous: 6.6 mmol/L (ref 0.5–1.9)
Lactic Acid, Venous: >9 mmol/L (ref 0.5–1.9)

## 2021-05-25 LAB — TROPONIN I (HIGH SENSITIVITY)
Troponin I (High Sensitivity): 30 ng/L — ABNORMAL HIGH (ref <18)
Troponin I (High Sensitivity): 29 ng/L — ABNORMAL HIGH (ref <18)

## 2021-05-25 LAB — PROCALCITONIN: Procalcitonin: <0.1 ng/mL

## 2021-05-25 MED ORDER — SODIUM CHLORIDE 0.9 % IV BOLUS
1000.0000 mL | Freq: Once | INTRAVENOUS | Status: AC
  Administered 2021-05-25: 1000 mL via INTRAVENOUS

## 2021-05-25 MED ORDER — SODIUM CHLORIDE 0.9 % IV BOLUS
500.0000 mL | Freq: Once | INTRAVENOUS | Status: AC
  Administered 2021-05-25: 500 mL via INTRAVENOUS

## 2021-05-25 MED ORDER — BUDESONIDE 0.25 MG/2ML IN SUSP
0.2500 mg | Freq: Two times a day (BID) | RESPIRATORY_TRACT | Status: DC
  Administered 2021-05-25 – 2021-06-02 (×16): 0.25 mg via RESPIRATORY_TRACT
  Filled 2021-05-25 (×17): qty 2

## 2021-05-25 MED ORDER — DOCUSATE SODIUM 100 MG PO CAPS: 100.0000 mg | ORAL_CAPSULE | Freq: Two times a day (BID) | ORAL | Status: DC | PRN

## 2021-05-25 MED ORDER — IPRATROPIUM-ALBUTEROL 0.5-2.5 (3) MG/3ML IN SOLN
3.0000 mL | Freq: Four times a day (QID) | RESPIRATORY_TRACT | Status: DC | PRN
  Filled 2021-05-25: qty 3

## 2021-05-25 MED ORDER — NOREPINEPHRINE 4 MG/250ML-% IV SOLN
0.0000 ug/min | INTRAVENOUS | Status: DC
  Administered 2021-05-26: 5 ug/min via INTRAVENOUS
  Filled 2021-05-25: qty 250

## 2021-05-25 MED ORDER — SODIUM CHLORIDE 0.9 % IV SOLN: INTRAVENOUS | Status: DC

## 2021-05-25 MED ORDER — POLYETHYLENE GLYCOL 3350 17 G PO PACK: 17.0000 g | PACK | Freq: Every day | ORAL | Status: DC | PRN

## 2021-05-25 MED ORDER — IOHEXOL 300 MG/ML  SOLN
100.0000 mL | Freq: Once | INTRAMUSCULAR | Status: AC | PRN
  Administered 2021-05-25: 100 mL via INTRAVENOUS

## 2021-05-25 MED ORDER — LACTATED RINGERS IV BOLUS: 500.0000 mL | Freq: Once | INTRAVENOUS | Status: DC

## 2021-05-25 MED ORDER — INSULIN ASPART 100 UNIT/ML IJ SOLN
0.0000 [IU] | INTRAMUSCULAR | Status: DC
  Administered 2021-05-25: 3 [IU] via SUBCUTANEOUS
  Administered 2021-05-26: 1 [IU] via SUBCUTANEOUS
  Administered 2021-05-26: 2 [IU] via SUBCUTANEOUS
  Administered 2021-05-29 – 2021-05-30 (×2): 1 [IU] via SUBCUTANEOUS
  Administered 2021-05-30 – 2021-06-01 (×4): 2 [IU] via SUBCUTANEOUS
  Administered 2021-06-01 (×2): 1 [IU] via SUBCUTANEOUS
  Administered 2021-06-01 – 2021-06-02 (×3): 2 [IU] via SUBCUTANEOUS
  Administered 2021-06-02: 5 [IU] via SUBCUTANEOUS
  Filled 2021-05-25 (×15): qty 1

## 2021-05-25 MED ORDER — SODIUM CHLORIDE 0.9 % IV SOLN
3.0000 g | Freq: Once | INTRAVENOUS | Status: AC
  Administered 2021-05-25: 3 g via INTRAVENOUS
  Filled 2021-05-25: qty 8

## 2021-05-25 MED ORDER — POTASSIUM CHLORIDE 10 MEQ/100ML IV SOLN
10.0000 meq | INTRAVENOUS | Status: AC
  Administered 2021-05-25 – 2021-05-26 (×5): 10 meq via INTRAVENOUS
  Filled 2021-05-25 (×4): qty 100

## 2021-05-25 MED ORDER — INSULIN ASPART 100 UNIT/ML IJ SOLN: 0.0000 [IU] | Freq: Three times a day (TID) | INTRAMUSCULAR | Status: DC

## 2021-05-25 MED ORDER — SODIUM CHLORIDE 0.9 % IV SOLN
250.0000 mL | INTRAVENOUS | Status: DC
  Administered 2021-05-26: 250 mL via INTRAVENOUS

## 2021-05-25 MED ORDER — SODIUM BICARBONATE 8.4 % IV SOLN
50.0000 meq | Freq: Once | INTRAVENOUS | Status: AC
  Administered 2021-05-25: 50 meq via INTRAVENOUS
  Filled 2021-05-25: qty 50

## 2021-05-25 MED ORDER — STERILE WATER FOR INJECTION IV SOLN
INTRAVENOUS | Status: DC
  Filled 2021-05-25 (×2): qty 1000
  Filled 2021-05-25: qty 150
  Filled 2021-05-25: qty 1000
  Filled 2021-05-25: qty 150
  Filled 2021-05-25: qty 1000
  Filled 2021-05-25: qty 150
  Filled 2021-05-25: qty 1000
  Filled 2021-05-25: qty 150
  Filled 2021-05-25: qty 1000

## 2021-05-25 MED ORDER — PIPERACILLIN-TAZOBACTAM 3.375 G IVPB
3.3750 g | Freq: Three times a day (TID) | INTRAVENOUS | Status: AC
  Administered 2021-05-25 – 2021-05-29 (×13): 3.375 g via INTRAVENOUS
  Filled 2021-05-25 (×13): qty 50

## 2021-05-25 MED ORDER — AMPICILLIN-SULBACTAM SODIUM 3 (2-1) G IJ SOLR: 3.0000 g | Freq: Four times a day (QID) | INTRAMUSCULAR | Status: DC

## 2021-05-25 MED ORDER — INSULIN ASPART 100 UNIT/ML IJ SOLN: 0.0000 [IU] | Freq: Every day | INTRAMUSCULAR | Status: DC

## 2021-05-25 MED ORDER — NOREPINEPHRINE 4 MG/250ML-% IV SOLN
INTRAVENOUS | Status: AC
  Administered 2021-05-26: 2 ug/min via INTRAVENOUS
  Filled 2021-05-25: qty 250

## 2021-05-25 NOTE — ED Notes (Signed)
RN called [facility] 911 center at this time to get in touch with [facility].

## 2021-05-25 NOTE — ED Notes (Signed)
Called rrt for abd order, called lab for labs ordered by icu

## 2021-05-25 NOTE — ED Notes (Signed)
Pt has what appears to be feces under pt finger nails, on the outside of her brief and in the peri area of pt. Pt also appears to have vomit on her shirt that is dried and has matted eyelashes.

## 2021-05-25 NOTE — H&P (Signed)
NAME:  Veronica Anderson, MRN:  [id number], DOB:  1945/10/19, LOS: 0 ADMISSION DATE:  05/25/2021, CONSULTATION DATE:  05/25/2021 REFERRING MD:  Blake Divine MD  CHIEF COMPLAINT:  Altered Mental Status   HPI  75 y.o with significant PMH as below who presented to the ED with chief complaints of shortness of breath and altered mental status.  Patient is currently on BiPAP history obtained from patient's son who is currently at the bedside.  Per patient's son, patient has been feeling well for the past 4 days.  Reports decreased appetite and poor p.o. intake.  Over the last 2 days patient is also noted with increased shortness of breath decreased mental status.  Patient's son stated he tried to feed her but could not hold anything in her mouth and drooling from the right.  Patient's son called EMS today due to worsening symptoms.  On EMS arrival, patient was noted with sats of 78% on therefore was placed on nasal cannula at 2 L with improvement in sats to 95%.  Patient was also covered in vomit and feces  ED Course: On arrival to the ED, she was afebrile with blood pressure  112/70 mm Hg and pulse rate 113 beats/min, RR 17 per minute 95% on nonrebreather mask.  Sats continued to drop to 85% and nonrebreather was placed on HFNC and subsequently on BiPAP. Pertinent Labs in Red/Diagnostics Findings: Na+/ K+: 140/3.8 Glucose: 215 BUN/Cr.: 18/0.83 Calcium: 8.4 AST/ALT:   WBC/ TMAX: 7.7/ afebrile Hgb/Hct: 10.2/32.7 Plts:307 PCT: negative <0.10 Lactic acid: Pending COVID PCR: Negative   Troponin: 30 BNP: 103 VBG result:  pO2 41; pCO2 34; pH 7.37;  HCO3 19.7, %O2 Sat 74.4.   EKG: normal EKG, normal sinus rhythm, unchanged from previous tracings, atrial fibrillation, rate 120bmp. Imaging : CXR: Mild patchy opacity noted in the right perihilar region, developing pneumonia is not excluded.  PCCM consulted for admission and management of acute hypoxia with high risk for intubation. Past Medical  History   Post-menopausal bleeding 11/18/2017  UTI (urinary tract infection) 09/11/2017  Pressure injury of skin 09/11/2017  Chronic kidney disease (CKD), stage IV (severe) (HCC) 07/29/2017  Mild protein-calorie malnutrition ([place]) 07/29/2017  Anemia due to chronic kidney disease    Prolonged Q-T interval on ECG 12/23/2016  CHF (congestive heart failure), NYHA class I ([place]) 11/07/2016  Nocturnal oxygen desaturation 06/26/2015  Anemia in chronic illness 02/23/2015  Asthma, mild intermittent 02/23/2015  Benign essential HTN 57/84/6962  Diastolic heart failure, NYHA class 3 ([place]) 02/23/2015  Benign hypertension with chronic kidney disease, stage III ([place]) 02/23/2015  CN (constipation) 02/23/2015  Diabetes mellitus with neuropathy ([place]) 02/23/2015  Dyslipidemia 02/23/2015  Elevated ferritin 02/23/2015  Gastro-esophageal reflux disease without esophagitis 02/23/2015  LBP (low back pain) 02/23/2015  Morbid obesity due to excess calories ([place]) 02/23/2015  Obstructive apnea 02/23/2015  Osteoarthritis 02/23/2015  Neuropathy 02/23/2015  Perennial allergic rhinitis with seasonal variation 02/23/2015  History of pneumonia 02/23/2015  Vitamin D deficiency 02/23/2015  Cataract 04/16/2010  Background diabetic retinopathy ([place]) 12/18/2009  CVA 1996 with residual deficit GERD Myocardial infarction  Significant Hospital Events   05/25/21: Patient admitted to stepdown with acute hypoxic respiratory failure  Consults:  PCCM  Procedures:  NONE  Significant Diagnostic Tests:  05/25/21: Chest Xray>Mild patchy opacity noted in the right perihilar region, developing pneumonia is not excluded. 05/25/21: Noncontrast CT head>  Micro Data:  11/12: SARS-CoV-2 PCR> negative 11/12: Influenza PCR> negative 11/12: Blood culture x2> 11/12: Urine Culture> 11/12: MRSA PCR>>  11/12: Strep  pneumo urinary antigen> 11/12: Legionella urinary antigen>  Antimicrobials:  Unasyn 11/12>  OBJECTIVE   Blood pressure 111/69, pulse (!) 112, temperature 99.7 F (37.6 C), temperature source Rectal, resp. rate 16, weight 72.3 kg, SpO2 (!) 88 %.    FiO2 (%):  [98 %] 98 %  No intake or output data in the 24 hours ending 05/25/21 1537 Filed Weights   05/25/21 1310  Weight: 72.3 kg     Physical Examination  GENERAL: 75 year-old critically ill patient lying in the bed on BiPAP EYES: Pupils equal, round, reactive to light and accommodation. No scleral icterus. Extraocular muscles intact.  HEENT: Head atraumatic, normocephalic. Oropharynx and nasopharynx clear.  NECK:  Supple, no jugular venous distention. No thyroid enlargement, no tenderness.  LUNGS: Decreased breath sounds bilaterally, no wheezing, rales, MODERATE rhonchi bilaterally. No use of accessory muscles of respiration.  CARDIOVASCULAR: S1, S2 normal. No murmurs, rubs, or gallops.  ABDOMEN: Soft, nontender, nondistended. Bowel sounds present. No organomegaly or mass.  EXTREMITIES: No pedal edema, or clubbing.  Mild cyanosis of extremities NEUROLOGIC: Cranial nerves II through XII are intact EXCEPT flaccidity of bilateral upper extremities noted.  Unable to break gravity.  Grimaces to noxious stimulus. Gait not checked.  PSYCHIATRIC: The patient is on BiPAP and altered unable to assess orientation SKIN: No obvious rash, lesion, or ulcer.   Labs/imaging that I havepersonally reviewed  (right click and "Reselect all SmartList Selections" daily)     Labs   CBC: Recent Labs  Lab 05/25/21 1310  WBC 7.7  NEUTROABS 5.8  HGB 10.2*  HCT 32.7*  MCV 80.9  PLT 580    Basic Metabolic Panel: Recent Labs  Lab 05/25/21 1310  NA 143  K 3.6  CL 107  CO2 28  GLUCOSE 215*  BUN 18  CREATININE 0.83  CALCIUM 8.4*   GFR: CrCl cannot be calculated (Unknown ideal weight.). Recent Labs  Lab 05/25/21 1310  PROCALCITON <0.10  WBC 7.7    Liver Function Tests: No results for input(s): AST, ALT, ALKPHOS, BILITOT, PROT, ALBUMIN in  the last 168 hours. No results for input(s): LIPASE, AMYLASE in the last 168 hours. No results for input(s): AMMONIA in the last 168 hours.  ABG    Component Value Date/Time   PHART 7.39 06/28/2018 0302   PCO2ART 49 (H) 06/28/2018 0302   PO2ART 46 (L) 06/28/2018 0302   HCO3 19.7 (L) 05/25/2021 1335   ACIDBASEDEF 5.0 (H) 05/25/2021 1335   O2SAT 74.4 05/25/2021 1335     Coagulation Profile: No results for input(s): INR, PROTIME in the last 168 hours.  Cardiac Enzymes: No results for input(s): CKTOTAL, CKMB, CKMBINDEX, TROPONINI in the last 168 hours.  HbA1C: Hemoglobin A1C  Date/Time Value Ref Range Status  04/09/2014 05:22 AM 6.2 4.2 - 6.3 % Final    Comment:    The American Diabetes Association recommends that a primary goal of therapy should be <7% and that physicians should reevaluate the treatment regimen in patients with HbA1c values consistently >8%.   11/24/2013 02:56 AM 7.1 (H) 4.2 - 6.3 % Final    Comment:    The American Diabetes Association recommends that a primary goal of therapy should be <7% and that physicians should reevaluate the treatment regimen in patients with HbA1c values consistently >8%.    Hgb A1c MFr Bld  Date/Time Value Ref Range Status  06/28/2018 08:30 AM 5.7 (H) 4.8 - 5.6 % Final    Comment:    (NOTE) Pre diabetes:  5.7%-6.4% Diabetes:              >6.4% Glycemic control for   <7.0% adults with diabetes   10/23/2017 11:49 AM 6.3 (H) <5.7 % of total Hgb Final    Comment:    For someone without known diabetes, a hemoglobin  A1c value between 5.7% and 6.4% is consistent with prediabetes and should be confirmed with a  follow-up test. . For someone with known diabetes, a value <7% indicates that their diabetes is well controlled. A1c targets should be individualized based on duration of diabetes, age, comorbid conditions, and other considerations. . This assay result is consistent with an increased risk of  diabetes. . Currently, no consensus exists regarding use of hemoglobin A1c for diagnosis of diabetes for children. .     CBG: No results for input(s): GLUCAP in the last 168 hours.  Review of Systems:   Patient is on BiPAP unable to obtain  Past Medical History  She,  has a past medical history of Allergy, Anemia, Asthma, Cataract, CHF (NYHA class II, ACC/AHA stage C) ([place]), Chronic kidney disease, Constipation, Diabetes mellitus without complication ([place]), Edema, GERD (gastroesophageal reflux disease), Hyperlipidemia, Hypertension, Left ankle pain, Lumbago, Myocardial infarction ([place]), Neuropathy, Neuropathy, OA (osteoarthritis), Obesity, Obstructive sleep apnea syndrome, Paresthesia, Protein calorie malnutrition ([place]), Requires supplemental oxygen, Stroke ([place]), and Vitamin D deficiency.   Surgical History    Past Surgical History:  Procedure Laterality Date   ANKLE SURGERY Left    CATARACT EXTRACTION W/PHACO Right 12/20/2015   Procedure: CATARACT EXTRACTION PHACO AND INTRAOCULAR LENS PLACEMENT (IOC);  Surgeon: Eulogio Bear, MD;  Location: [facility] ORS;  Service: Ophthalmology;  Laterality: Right;  [place] 2.50 AP% 21.8 CDE 37.04 Fluid pack lot # [id number]   CATARACT EXTRACTION W/PHACO Left 02/14/2016   Procedure: CATARACT EXTRACTION PHACO AND INTRAOCULAR LENS PLACEMENT (IOC);  Surgeon: Eulogio Bear, MD;  Location: [facility] ORS;  Service: Ophthalmology;  Laterality: Left;  [place] 3.12 AP% 42.1 CDE 47.12 Fluid Pack lot # [id number]   COLONOSCOPY     FRACTURE SURGERY Left    ankle     Social History   reports that she quit smoking about 26 years ago. Her smoking use included cigarettes. She has never used smokeless tobacco. She reports that she does not drink alcohol and does not use drugs.   Family History   Her family history includes Diabetes type II in an other family member.   Allergies Allergies  Allergen Reactions   Ace Inhibitors Cough     Home Medications  Prior to  Admission medications   Medication Sig Start Date End Date Taking? Authorizing Provider  amLODipine (NORVASC) 10 MG tablet Take 1 tablet (10 mg total) by mouth daily. 10/23/17  Yes Poulose, Bethel Born, NP  aspirin EC 81 MG tablet Take 81 mg by mouth at bedtime.   Yes [provider]  cloNIDine (CATAPRES - DOSED IN MG/24 HR) 0.3 mg/24hr patch PLACE 1 PATCH (0.3 MG TOTAL) ONTO THE SKIN ONCE A WEEK. Patient taking differently: Place 0.3 mg onto the skin every Sunday. 01/28/18  Yes Sowles, Drue Stager, MD  desonide (DESOWEN) 0.05 % ointment Apply 1 application topically 2 (two) times daily. 10/23/17  Yes Poulose, Bethel Born, NP  famotidine (PEPCID) 20 MG tablet Take 20 mg by mouth 2 (two) times daily. 04/30/21  Yes [provider]  ferrous sulfate 325 (65 FE) MG tablet Take 1 tablet (325 mg total) by mouth 2 (two) times daily with a meal. 09/15/17  Yes Gladstone Lighter, MD  furosemide (LASIX) 40 MG tablet Take 1 tablet (40 mg total) by mouth daily. 10/23/17  Yes Poulose, Bethel Born, NP  gabapentin (NEURONTIN) 300 MG capsule TAKE 1 CAPSULE BY MOUTH THREE TIMES A DAY Patient taking differently: Take 300 mg by mouth 3 (three) times daily. 12/23/17  Yes Sowles, Drue Stager, MD  hydrALAZINE (APRESOLINE) 50 MG tablet Take 50 mg by mouth 3 (three) times daily. 04/30/21  Yes [provider]  medroxyPROGESTERone (PROVERA) 10 MG tablet Take 2 tablets (20 mg total) by mouth daily. 11/18/17  Yes Gae Dry, MD  metoprolol tartrate (LOPRESSOR) 100 MG tablet Take 100 mg by mouth daily. 04/26/21  Yes [provider]  montelukast (SINGULAIR) 10 MG tablet Take 1 tablet (10 mg total) by mouth daily. 10/23/17  Yes Poulose, Bethel Born, NP  nystatin cream (MYCOSTATIN) Apply 1 application topically 2 (two) times daily. 05/09/21  Yes [provider]  pravastatin (PRAVACHOL) 40 MG tablet TAKE 1 TABLET BY MOUTH EVERY DAY 01/05/18  Yes Poulose, Bethel Born, NP  glucose blood (ACCU-CHEK  AVIVA PLUS) test strip 1 each by Other route 2 (two) times daily. Use as instructed 08/06/17   Steele Sizer, MD  glucose blood (ACCU-CHEK AVIVA PLUS) test strip CHECK BLOOD SUGAR TWICE DAILY 10/23/17   Poulose, Bethel Born, NP  Insulin Glargine (LANTUS SOLOSTAR) 100 UNIT/ML Solostar Pen INJECT 35 UNITS SUBCUTANEOUSLY DAILY Patient taking differently: Inject 28 Units into the skin daily. 10/23/17   Poulose, Bethel Born, NP  insulin lispro (HUMALOG KWIKPEN) 100 UNIT/ML KiwkPen INJECT 5 UNITS SUBCUTANEOUSLY BEFORE MEALS; Dx E11.40, LON 99 months 10/23/17   Poulose, Bethel Born, NP  Insulin Pen Needle (NOVOFINE) 32G X 6 MM MISC Inject 1 application into the skin 4 (four) times daily. 10/23/17   Poulose, Bethel Born, NP  Vitamin D, Ergocalciferol, (DRISDOL) 50000 units CAPS capsule TAKE ONE CAPSULE BY MOUTH ONCE A WEEK On Sunday Patient not taking: Reported on 05/25/2021 10/23/17   Fredderick Severance, NP  Scheduled Meds: Continuous Infusions:  sodium chloride 125 mL/hr at 05/25/21 1547   ampicillin-sulbactam (UNASYN) IV     PRN Meds:.docusate sodium, polyethylene glycol   Assessment & Plan:  Acute Hypoxic Respiratory Failure secondary to Aspiration Pneumonia? PMHx: Obstructive Sleep Apnea on CPAP -Supplemental O2 as needed to maintain O2 saturations 88 to 92% -BiPAP, wean as tolerated -High risk for intubation -Follow intermittent ABG and chest x-ray as needed -Ensure adequate pulmonary hygiene  -Follow blood and urine cultures, trend PCT -Daily CBC, monitor fever curve -Budesonide inhaler/nebs BID, bronchodilators PRN  Acute Metabolic Encephalopathy due to hypoxia Hx of CVA in 1996 -Obtain CT Head -Provide supportive care -Hold aspirin and anticoagulation pending CT head -Hold sedatives as able   Chronic Diastolic heart failure, NYHA class 3 (BNP only 103 no evidence of volume overload, appears dehydrated) Hypertension Atrial fibrillation on monitor Hx: MI, HLD  -Continuous cardiac  monitoring -Maintain MAP greater than 65 -Serial EKG -Trend troponin -Hold Lasix, clonidine, metoprolol, hydralazine and amlodipine for now, resume once BP stable -Continue pravastatin 40 mg daily -Repeat 2D Echocardiogram   Hx of CKD Stage III BUN/Cr on admission -Monitor I&O's / urinary output -Follow BMP -Ensure adequate renal perfusion -Avoid nephrotoxic agents as able -Replace electrolytes as indicated   Diabetes mellitus -CBGs -Sliding scale insulin -Follow ICU hyper/hypoglycemia protocol -Hold home medication for now    Best practice:  Diet:  NPO Pain/Anxiety/Delirium protocol (if indicated): No VAP protocol (if indicated): Not indicated DVT prophylaxis:  Contraindicated GI prophylaxis: H2B Glucose control:  SSI Yes Central venous access:  N/A Arterial line:  N/A Foley:  Yes, and it is still needed Mobility:  bed rest  PT consulted: Yes Last date of multidisciplinary goals of care discussion [11/12] Code Status:  full code Disposition: Stepdown   = Goals of Care = Code Status Order: @CODE @   Primary Emergency Contact: Pence,Craig L, Home Phone: [phone] Wishes to pursue full aggressive treatment and intervention options, including CPR and intubation, but goals of care will be addressed on going with family if that should become necessary.  Critical care time: 45 minutes     Rufina Falco, DNP, CCRN, FNP-C, AGACNP-BC Acute Care Nurse Practitioner  [facility] Pager: [phone] [place] at [facility]  .

## 2021-05-25 NOTE — ED Triage Notes (Signed)
Pt arrives via EMS from home. Per EMS, family sts that pt has been feeling SOB ffor the last two days while pt sts that she has been feeling ill for the last 4 days. Pt o2 sat on EMS arrival was 78 on RA. Pt was than put on o2 and o2 sat rose up to 95% or greater. Pt is covered in vomit and feces. Pt has also look to not have been bathed in days. Per EMS pt is a hospice pt and lives at home with family.

## 2021-05-25 NOTE — ED Provider Notes (Signed)
[facility] Emergency Department Provider Note   ____________________________________________   Event Date/Time   First MD Initiated Contact with Patient 05/25/21 1303     (approximate)  I have reviewed the triage vital signs and the nursing notes.   HISTORY  Chief Complaint Respiratory Distress    HPI Veronica Anderson is a 75 y.o. female with past medical history of hypertension, hyperlipidemia, diabetes, CKD, CHF, and stroke with right hemiparesis who presents to the ED complaining of shortness of breath.  Patient's son provides majority of history due to patient's difficulty breathing.  He states that she has had increasing cough and difficulty breathing over the past 3 days, seem to rapidly progress over the past 24 hours when she has dealt with significant secretions in her mouth.  Patient has not complained of any pain, specifically denies any pain in her chest.  Son does not report any vomiting, however patient arrived with emesis on her chest and noted to have significant amount of stool in her brief and on her back.  She was noted to be hypoxic with EMS, subsequently placed on a nonrebreather with improvement.        Past Medical History:  Diagnosis Date   Allergy    Anemia    Asthma    Cataract    bilateral   CHF (NYHA class II, ACC/AHA stage C) (HCC)    Chronic kidney disease    stage IV (severe), Dr. Aleene Davidson   Constipation    Diabetes mellitus without complication ([place])    Edema    feet/ankles   GERD (gastroesophageal reflux disease)    Hyperlipidemia    Hypertension    Left ankle pain    Lumbago    Myocardial infarction ([place])    Neuropathy    Neuropathy    OA (osteoarthritis)    Obesity    Obstructive sleep apnea syndrome    CPAP   Paresthesia    Foot   Protein calorie malnutrition (HCC)    Requires supplemental oxygen    Stroke ([place])    Vitamin D deficiency     Patient Active Problem List   Diagnosis Date Noted   Acute  respiratory failure with hypoxemia ([place]) 06/28/2018   Post-menopausal bleeding 11/18/2017   UTI (urinary tract infection) 09/11/2017   Pressure injury of skin 09/11/2017   Chronic kidney disease (CKD), stage IV (severe) (HCC) 07/29/2017   Mild protein-calorie malnutrition ([place]) 07/29/2017   Anemia due to chronic kidney disease    Prolonged Q-T interval on ECG 12/23/2016   CHF (congestive heart failure), NYHA class I ([place]) 11/07/2016   Nocturnal oxygen desaturation 06/26/2015   Anemia in chronic illness 02/23/2015   Asthma, mild intermittent 02/23/2015   Benign essential HTN 26/94/8546   Diastolic heart failure, NYHA class 3 ([place]) 02/23/2015   Benign hypertension with chronic kidney disease, stage III ([place]) 02/23/2015   CN (constipation) 02/23/2015   Diabetes mellitus with neuropathy ([place]) 02/23/2015   Dyslipidemia 02/23/2015   Elevated ferritin 02/23/2015   Gastro-esophageal reflux disease without esophagitis 02/23/2015   LBP (low back pain) 02/23/2015   Morbid obesity due to excess calories ([place]) 02/23/2015   Obstructive apnea 02/23/2015   Osteoarthritis 02/23/2015   Neuropathy 02/23/2015   Perennial allergic rhinitis with seasonal variation 02/23/2015   History of pneumonia 02/23/2015   Vitamin D deficiency 02/23/2015   Cataract 04/16/2010   Background diabetic retinopathy ([place]) 12/18/2009    Past Surgical History:  Procedure Laterality Date   ANKLE SURGERY  Left    CATARACT EXTRACTION W/PHACO Right 12/20/2015   Procedure: CATARACT EXTRACTION PHACO AND INTRAOCULAR LENS PLACEMENT (IOC);  Surgeon: Eulogio Bear, MD;  Location: [facility] ORS;  Service: Ophthalmology;  Laterality: Right;  [place] 2.50 AP% 21.8 CDE 37.04 Fluid pack lot # [id number]   CATARACT EXTRACTION W/PHACO Left 02/14/2016   Procedure: CATARACT EXTRACTION PHACO AND INTRAOCULAR LENS PLACEMENT (IOC);  Surgeon: Eulogio Bear, MD;  Location: [facility] ORS;  Service: Ophthalmology;  Laterality: Left;  [place] 3.12 AP% 42.1 CDE  47.12 Fluid Pack lot # [id number]   COLONOSCOPY     FRACTURE SURGERY Left    ankle    Prior to Admission medications   Medication Sig Start Date End Date Taking? Authorizing Provider  amLODipine (NORVASC) 10 MG tablet Take 1 tablet (10 mg total) by mouth daily. 10/23/17  Yes Poulose, Bethel Born, NP  aspirin EC 81 MG tablet Take 81 mg by mouth at bedtime.   Yes [provider]  cloNIDine (CATAPRES - DOSED IN MG/24 HR) 0.3 mg/24hr patch PLACE 1 PATCH (0.3 MG TOTAL) ONTO THE SKIN ONCE A WEEK. Patient taking differently: Place 0.3 mg onto the skin every Sunday. 01/28/18  Yes Sowles, Drue Stager, MD  desonide (DESOWEN) 0.05 % ointment Apply 1 application topically 2 (two) times daily. 10/23/17  Yes Poulose, Bethel Born, NP  famotidine (PEPCID) 20 MG tablet Take 20 mg by mouth 2 (two) times daily. 04/30/21  Yes [provider]  ferrous sulfate 325 (65 FE) MG tablet Take 1 tablet (325 mg total) by mouth 2 (two) times daily with a meal. 09/15/17  Yes Gladstone Lighter, MD  furosemide (LASIX) 40 MG tablet Take 1 tablet (40 mg total) by mouth daily. 10/23/17  Yes Poulose, Bethel Born, NP  gabapentin (NEURONTIN) 300 MG capsule TAKE 1 CAPSULE BY MOUTH THREE TIMES A DAY Patient taking differently: Take 300 mg by mouth 3 (three) times daily. 12/23/17  Yes Sowles, Drue Stager, MD  hydrALAZINE (APRESOLINE) 50 MG tablet Take 50 mg by mouth 3 (three) times daily. 04/30/21  Yes [provider]  medroxyPROGESTERone (PROVERA) 10 MG tablet Take 2 tablets (20 mg total) by mouth daily. 11/18/17  Yes Gae Dry, MD  metoprolol tartrate (LOPRESSOR) 100 MG tablet Take 100 mg by mouth daily. 04/26/21  Yes [provider]  montelukast (SINGULAIR) 10 MG tablet Take 1 tablet (10 mg total) by mouth daily. 10/23/17  Yes Poulose, Bethel Born, NP  nystatin cream (MYCOSTATIN) Apply 1 application topically 2 (two) times daily. 05/09/21  Yes [provider]  pravastatin (PRAVACHOL) 40 MG tablet  TAKE 1 TABLET BY MOUTH EVERY DAY 01/05/18  Yes Poulose, Bethel Born, NP  glucose blood (ACCU-CHEK AVIVA PLUS) test strip 1 each by Other route 2 (two) times daily. Use as instructed 08/06/17   Steele Sizer, MD  glucose blood (ACCU-CHEK AVIVA PLUS) test strip CHECK BLOOD SUGAR TWICE DAILY 10/23/17   Poulose, Bethel Born, NP  Insulin Glargine (LANTUS SOLOSTAR) 100 UNIT/ML Solostar Pen INJECT 35 UNITS SUBCUTANEOUSLY DAILY Patient taking differently: Inject 28 Units into the skin daily. 10/23/17   Poulose, Bethel Born, NP  insulin lispro (HUMALOG KWIKPEN) 100 UNIT/ML KiwkPen INJECT 5 UNITS SUBCUTANEOUSLY BEFORE MEALS; Dx E11.40, LON 99 months 10/23/17   Poulose, Bethel Born, NP  Insulin Pen Needle (NOVOFINE) 32G X 6 MM MISC Inject 1 application into the skin 4 (four) times daily. 10/23/17   Poulose, Bethel Born, NP  Vitamin D, Ergocalciferol, (DRISDOL) 50000 units CAPS capsule TAKE ONE CAPSULE BY  MOUTH ONCE A WEEK On Sunday Patient not taking: Reported on 05/25/2021 10/23/17   Fredderick Severance, NP    Allergies Ace inhibitors  Family History  Problem Relation Age of Onset   Diabetes type II Other     Social History Social History   Tobacco Use   Smoking status: Former    Types: Cigarettes    Quit date: 07/14/1994    Years since quitting: 26.8   Smokeless tobacco: Never  Vaping Use   Vaping Use: Never used  Substance Use Topics   Alcohol use: No    Alcohol/week: 0.0 standard drinks   Drug use: No    Review of Systems  Constitutional: No fever/chills Eyes: No visual changes. ENT: No sore throat. Cardiovascular: Denies chest pain. Respiratory: Positive for cough and shortness of breath. Gastrointestinal: No abdominal pain.  No nausea, no vomiting.  No diarrhea.  No constipation. Genitourinary: Negative for dysuria. Musculoskeletal: Negative for back pain. Skin: Negative for rash. Neurological: Negative for headaches, focal weakness or  numbness.  ____________________________________________   PHYSICAL EXAM:  VITAL SIGNS: ED Triage Vitals  Enc Vitals Group     BP      Pulse      Resp      Temp      Temp src      SpO2      Weight      Height      Head Circumference      Peak Flow      Pain Score      Pain Loc      Pain Edu?      Excl. in [place]?     Constitutional: Alert and oriented. Eyes: Conjunctivae are normal. Head: Atraumatic. Nose: No congestion/rhinnorhea. Mouth/Throat: Mucous membranes are moist. Neck: Normal ROM Cardiovascular: Normal rate, regular rhythm. Grossly normal heart sounds.  2+ radial pulses bilaterally. Respiratory: Tachypneic with increased respiratory effort, rhonchi and rales noted to right lung fields. Gastrointestinal: Soft and nontender. No distention. Genitourinary: deferred Musculoskeletal: No lower extremity tenderness nor edema. Neurologic:  Normal speech and language.  Severely debilitated with right hemiparesis. Skin:  Skin is warm, dry and intact. No rash noted. Psychiatric: Mood and affect are normal. Speech and behavior are normal.  ____________________________________________   LABS (all labs ordered are listed, but only abnormal results are displayed)  Labs Reviewed  CBC WITH DIFFERENTIAL/PLATELET - Abnormal; Notable for the following components:      Result Value   Hemoglobin 10.2 (*)    HCT 32.7 (*)    MCH 25.2 (*)    RDW 20.9 (*)    All other components within normal limits  BASIC METABOLIC PANEL - Abnormal; Notable for the following components:   Glucose, Bld 215 (*)    Calcium 8.4 (*)    All other components within normal limits  BRAIN NATRIURETIC PEPTIDE - Abnormal; Notable for the following components:   B Natriuretic Peptide 103.1 (*)    All other components within normal limits  BLOOD GAS, VENOUS - Abnormal; Notable for the following components:   pCO2, Ven 34 (*)    Bicarbonate 19.7 (*)    Acid-base deficit 5.0 (*)    All other components  within normal limits  TROPONIN I (HIGH SENSITIVITY) - Abnormal; Notable for the following components:   Troponin I (High Sensitivity) 30 (*)    All other components within normal limits  RESP PANEL BY RT-PCR (FLU A&B, COVID) [id number]  PROCALCITONIN  URINALYSIS, ROUTINE W REFLEX MICROSCOPIC  TROPONIN I (HIGH SENSITIVITY)   ____________________________________________  EKG  ED ECG REPORT I, Blake Divine, the attending physician, personally viewed and interpreted this ECG.   Date: 05/25/2021  EKG Time: 13:11  Rate: 110  Rhythm: sinus tachycardia  Axis: Normal  Intervals:nonspecific intraventricular conduction delay  ST&T Change: Lateral T wave inversions    PROCEDURES  Procedure(s) performed (including Critical Care):  .Critical Care Performed by: Blake Divine, MD Authorized by: Blake Divine, MD   Critical care provider statement:    Critical care time (minutes):  45   Critical care time was exclusive of:  Separately billable procedures and treating other patients and teaching time   Critical care was necessary to treat or prevent imminent or life-threatening deterioration of the following conditions:  Respiratory failure   Critical care was time spent personally by me on the following activities:  Development of treatment plan with patient or surrogate, discussions with consultants, evaluation of patient's response to treatment, examination of patient, ordering and review of laboratory studies, ordering and review of radiographic studies, ordering and performing treatments and interventions, pulse oximetry, re-evaluation of patient's condition, review of old charts and obtaining history from patient or surrogate   I assumed direction of critical care for this patient from another provider in my specialty: no     Care discussed with: admitting provider     ____________________________________________   INITIAL IMPRESSION / ASSESSMENT AND PLAN / ED COURSE       75 year old female with past medical history of hypertension, hyperlipidemia, diabetes, CKD, diastolic CHF, and stroke with right hemiparesis who presents to the ED with increasing cough and difficulty breathing over the past 3 days.  Patient noted to be hypoxic with EMS and placed on nonrebreather with improvement.  We attempted to take her off of nonrebreather and start 4 L nasal cannula, however patient once again became hypoxic with this.  Patient was trialed on bubbler high flow along with heated high flow with no improvement in O2 saturations.  She was subsequently placed on BiPAP, now maintaining O2 sats of 85 to 90% with 100% FiO2.  Chest x-ray reviewed by me and shows right-sided infiltrate concerning for aspiration, we will start on Unasyn.  EKG shows sinus tachycardia but with no ischemic changes, troponin mildly elevated but I have low suspicion for ACS at this time.  She was reportedly previously evaluated by hospice but determined not to be a candidate.  Patient was evaluated by ICU team and son agrees to hold off on intubation but would otherwise like to proceed with antibiotics and admission.      ____________________________________________   FINAL CLINICAL IMPRESSION(S) / ED DIAGNOSES  Final diagnoses:  Acute respiratory failure with hypoxia (HCC)  Aspiration pneumonia of right lower lobe, unspecified aspiration pneumonia type [facility])     ED Discharge Orders     None        Note:  This document was prepared using Dragon voice recognition software and may include unintentional dictation errors.    Blake Divine, MD 05/25/21 [phone]

## 2021-05-25 NOTE — Consult Note (Signed)
Pharmacy Antibiotic Note  Luanna Weesner is a 75 y.o. female admitted on 05/25/2021 with Aspiration Pneumonia.  Pharmacy has been consulted for Zosyn dosing.  Plan: Zosyn 3.375gm every 8 hrs  Weight: 72.3 kg (159 lb 6.3 oz)  Temp (24hrs), Avg:99.7 F (37.6 C), Min:99.7 F (37.6 C), Max:99.7 F (37.6 C)  Recent Labs  Lab 05/25/21 1310 05/25/21 1543  WBC 7.7  --   CREATININE 0.83  --   LATICACIDVEN  --  6.6*     CrCl cannot be calculated (Unknown ideal weight.).    Allergies  Allergen Reactions   Ace Inhibitors Cough    Antimicrobials this admission: 11/12 unasyn >> 11/12 11/12 zosyn>>   Dose adjustments this admission: None  Microbiology results: None  Thank you for allowing pharmacy to be a part of this patient's care.  Roberth Berling Rodriguez-Guzman PharmD, BCPS 05/25/2021 6:51 PM

## 2021-05-25 NOTE — Progress Notes (Addendum)
Lactic Acidosis without Leukocytosis in the setting of dehydration & poor PO intake Suspected Aspiration Pneumonia Anion Gap Metabolic Acidosis Hypokalemia Increased from 6.6 to > 9.0. Upon bedside assessment patient alert and talking, vitals stable with son [name] bedside on BIPAP support. Per son's report patient has c/o abdominal pain  over the past week. He denies nausea/vomiting, stating that she was too lethargic to swallow food and that's why EMS found her with food on her shirt. On physical exam patient denies abdominal pain at rest, denies nausea. With light palpation patient admits to mild abdominal pain in mid upper abdomen. Discussed case with Dr. Patsey Berthold and plan of care as follows. Cr increased from 0.83 to 1.30  DDx include: abdominal infection/colitis, ischemic bowel, obstruction  - STAT CT abdomen/pelvis with contrast - continue IVF, drop rate to 75 mL/h - Sodium bicarbonate drip initiated at 75 mL/h, with 1 amp IVP - f/u lactic/PCT, trend WBC/fever curve - f/u VBG - K+ supplementation   Discussed plan of care bedside with son & patient, all questions and concerns answered at this time.   Domingo Pulse Rust-Chester, AGACNP-BC Acute Care Nurse Practitioner [facility]   [phone] / [phone] Please see Amion for pager details.

## 2021-05-25 NOTE — Progress Notes (Signed)
eLink Physician-Brief Progress Note Patient Name: Veronica Anderson DOB: December 13, 1945 MRN: [id number]   Date of Service  05/25/2021  HPI/Events of Note  53 F history of CVA with residual right sided hemirparesis, presented with abdominal pain for the past week as well as respiratory distress. Abdominal CT with distended gallbladder and intraluminal gallstones without inflammation. Also with note of consolidation on RLL with air bronchograms.  Seen on AVAPS 16/430/50%/8 PEEP, MV 7 BP 91/72  HR 90  O2 100%   eICU Interventions  Aspiration pneumonia on piperacillin tazobactam Low threshold for intubation given risk for recurrent aspiration     Intervention Category Evaluation Type: New Patient Evaluation  Judd Lien 05/25/2021, 11:30 PM

## 2021-05-25 NOTE — Consult Note (Signed)
Pharmacy Antibiotic Note  Veronica Anderson is a 75 y.o. female admitted on 05/25/2021 with pneumonia.  Pharmacy has been consulted for Unaysn dosing.  Plan: Unasyn 3 g q6H  Weight: 72.3 kg (159 lb 6.3 oz)  Temp (24hrs), Avg:99.7 F (37.6 C), Min:99.7 F (37.6 C), Max:99.7 F (37.6 C)  Recent Labs  Lab 05/25/21 1310  WBC 7.7  CREATININE 0.83    CrCl cannot be calculated (Unknown ideal weight.).    Allergies  Allergen Reactions   Ace Inhibitors Cough    Antimicrobials this admission: 11/12 unasyn >>    Dose adjustments this admission: None  Microbiology results: None  Thank you for allowing pharmacy to be a part of this patient's care.  Oswald Hillock 05/25/2021 3:45 PM

## 2021-05-25 NOTE — ED Notes (Signed)
Ed md aware pt desaturation with nrb @ 15L, RRT contacted to place patient on bipap

## 2021-05-25 NOTE — ED Notes (Signed)
RN talking with Linda Hedges with APS at this time.

## 2021-05-25 NOTE — ED Notes (Signed)
Pt changed, covered in feces , sheets changed, diaper changed,

## 2021-05-26 ENCOUNTER — Inpatient Hospital Stay: Payer: Medicare Other

## 2021-05-26 ENCOUNTER — Inpatient Hospital Stay
Admit: 2021-05-26 | Discharge: 2021-05-26 | Disposition: A | Discharge status: Home / Self Care | Payer: Medicare Other | Attending: Nurse Practitioner | Admitting: Nurse Practitioner

## 2021-05-26 DIAGNOSIS — J9601 Acute respiratory failure with hypoxia: Secondary | ICD-10-CM | POA: Diagnosis not present

## 2021-05-26 DIAGNOSIS — L89159 Pressure ulcer of sacral region, unspecified stage: Secondary | ICD-10-CM | POA: Diagnosis present

## 2021-05-26 DIAGNOSIS — L89152 Pressure ulcer of sacral region, stage 2: Secondary | ICD-10-CM | POA: Diagnosis not present

## 2021-05-26 DIAGNOSIS — J690 Pneumonitis due to inhalation of food and vomit: Secondary | ICD-10-CM | POA: Diagnosis not present

## 2021-05-26 LAB — LACTIC ACID, PLASMA
Lactic Acid, Venous: >9 mmol/L (ref 0.5–1.9)
Lactic Acid, Venous: 7.6 mmol/L (ref 0.5–1.9)

## 2021-05-26 LAB — GLUCOSE, CAPILLARY
Glucose-Capillary: 147 mg/dL — ABNORMAL HIGH (ref 70–99)
Glucose-Capillary: 151 mg/dL — ABNORMAL HIGH (ref 70–99)
Glucose-Capillary: 112 mg/dL — ABNORMAL HIGH (ref 70–99)
Glucose-Capillary: 104 mg/dL — ABNORMAL HIGH (ref 70–99)
Glucose-Capillary: 90 mg/dL (ref 70–99)
Glucose-Capillary: 86 mg/dL (ref 70–99)

## 2021-05-26 LAB — CBC
HCT: 29.7 % — ABNORMAL LOW (ref 36.0–46.0)
Hemoglobin: 9.1 g/dL — ABNORMAL LOW (ref 12.0–15.0)
MCH: 24.6 pg — ABNORMAL LOW (ref 26.0–34.0)
MCHC: 30.6 g/dL (ref 30.0–36.0)
MCV: 80.3 fL (ref 80.0–100.0)
Platelets: 284 10*3/uL (ref 150–400)
RBC: 3.7 MIL/uL — ABNORMAL LOW (ref 3.87–5.11)
RDW: 21.1 % — ABNORMAL HIGH (ref 11.5–15.5)
WBC: 15.2 10*3/uL — ABNORMAL HIGH (ref 4.0–10.5)
nRBC: 0 % (ref 0.0–0.2)

## 2021-05-26 LAB — BLOOD GAS, VENOUS
Acid-base deficit: 7.2 mmol/L — ABNORMAL HIGH (ref 0.0–2.0)
Bicarbonate: 19.3 mmol/L — ABNORMAL LOW (ref 20.0–28.0)
Delivery systems: POSITIVE
FIO2: 0.7
Mechanical Rate: 16
O2 Saturation: 64.7 %
Patient temperature: 37
RATE: 16 resp/min
pCO2, Ven: 42 mmHg — ABNORMAL LOW (ref 44.0–60.0)
pH, Ven: 7.27 (ref 7.250–7.430)
pO2, Ven: 39 mmHg (ref 32.0–45.0)

## 2021-05-26 LAB — COMPREHENSIVE METABOLIC PANEL
ALT: 12 U/L (ref 0–44)
AST: 50 U/L — ABNORMAL HIGH (ref 15–41)
Albumin: 2.2 g/dL — ABNORMAL LOW (ref 3.5–5.0)
Alkaline Phosphatase: 49 U/L (ref 38–126)
Anion gap: 16 — ABNORMAL HIGH (ref 5–15)
BUN: 16 mg/dL (ref 8–23)
CO2: 18 mmol/L — ABNORMAL LOW (ref 22–32)
Calcium: 7.8 mg/dL — ABNORMAL LOW (ref 8.9–10.3)
Chloride: 113 mmol/L — ABNORMAL HIGH (ref 98–111)
Creatinine, Ser: 1.15 mg/dL — ABNORMAL HIGH (ref 0.44–1.00)
GFR, Estimated: 50 mL/min — ABNORMAL LOW (ref >60)
Glucose, Bld: 140 mg/dL — ABNORMAL HIGH (ref 70–99)
Potassium: 3.3 mmol/L — ABNORMAL LOW (ref 3.5–5.1)
Sodium: 147 mmol/L — ABNORMAL HIGH (ref 135–145)
Total Bilirubin: 1 mg/dL (ref 0.3–1.2)
Total Protein: 5.8 g/dL — ABNORMAL LOW (ref 6.5–8.1)

## 2021-05-26 LAB — CK: Total CK: 153 U/L (ref 38–234)

## 2021-05-26 LAB — PROCALCITONIN: Procalcitonin: 21 ng/mL

## 2021-05-26 LAB — POTASSIUM: Potassium: 3.8 mmol/L (ref 3.5–5.1)

## 2021-05-26 LAB — MRSA NEXT GEN BY PCR, NASAL: MRSA by PCR Next Gen: DETECTED — AB

## 2021-05-26 LAB — PHOSPHORUS: Phosphorus: 3 mg/dL (ref 2.5–4.6)

## 2021-05-26 LAB — STREP PNEUMONIAE URINARY ANTIGEN: Strep Pneumo Urinary Antigen: NEGATIVE

## 2021-05-26 LAB — MAGNESIUM: Magnesium: 1.7 mg/dL (ref 1.7–2.4)

## 2021-05-26 LAB — CORTISOL: Cortisol, Plasma: 37 ug/dL

## 2021-05-26 MED ORDER — CHLORHEXIDINE GLUCONATE CLOTH 2 % EX PADS
6.0000 | MEDICATED_PAD | Freq: Every day | CUTANEOUS | Status: DC
  Administered 2021-05-26 – 2021-06-02 (×8): 6 via TOPICAL

## 2021-05-26 MED ORDER — POTASSIUM CHLORIDE 10 MEQ/50ML IV SOLN
10.0000 meq | INTRAVENOUS | Status: AC
  Administered 2021-05-26 (×3): 10 meq via INTRAVENOUS
  Filled 2021-05-26 (×6): qty 50

## 2021-05-26 MED ORDER — SODIUM BICARBONATE 8.4 % IV SOLN
50.0000 meq | Freq: Once | INTRAVENOUS | Status: AC
  Administered 2021-05-26: 50 meq via INTRAVENOUS
  Filled 2021-05-26: qty 50

## 2021-05-26 MED ORDER — THIAMINE HCL 100 MG/ML IJ SOLN
100.0000 mg | INTRAMUSCULAR | Status: DC
  Administered 2021-05-27 – 2021-06-02 (×7): 100 mg via INTRAVENOUS
  Filled 2021-05-26 (×7): qty 2

## 2021-05-26 MED ORDER — MUPIROCIN 2 % EX OINT
1.0000 "application " | TOPICAL_OINTMENT | Freq: Two times a day (BID) | CUTANEOUS | Status: AC
  Administered 2021-05-26 – 2021-05-30 (×10): 1 via NASAL
  Filled 2021-05-26: qty 22

## 2021-05-26 MED ORDER — NOREPINEPHRINE 4 MG/250ML-% IV SOLN
0.0000 ug/min | INTRAVENOUS | Status: DC
Start: 2021-05-26 — End: 2021-05-29
  Administered 2021-05-26: 4.5 ug/min via INTRAVENOUS

## 2021-05-26 MED ORDER — ENOXAPARIN SODIUM 40 MG/0.4ML IJ SOSY
40.0000 mg | PREFILLED_SYRINGE | INTRAMUSCULAR | Status: DC
  Administered 2021-05-26 – 2021-05-29 (×4): 40 mg via SUBCUTANEOUS
  Filled 2021-05-26 (×4): qty 0.4

## 2021-05-26 MED ORDER — ALBUMIN HUMAN 25 % IV SOLN
25.0000 g | Freq: Four times a day (QID) | INTRAVENOUS | Status: AC
  Administered 2021-05-26 (×2): 25 g via INTRAVENOUS
  Filled 2021-05-26 (×2): qty 100

## 2021-05-26 MED ORDER — THIAMINE HCL 100 MG/ML IJ SOLN
200.0000 mg | Freq: Once | INTRAVENOUS | Status: AC
  Administered 2021-05-26: 200 mg via INTRAVENOUS
  Filled 2021-05-26: qty 2

## 2021-05-26 MED ORDER — MAGNESIUM SULFATE 2 GM/50ML IV SOLN
2.0000 g | Freq: Once | INTRAVENOUS | Status: AC
  Administered 2021-05-26: 2 g via INTRAVENOUS
  Filled 2021-05-26: qty 50

## 2021-05-26 NOTE — Progress Notes (Signed)
Pt arrived from ED to unit, multiple interventions done see charting, skin assessment charted PTA skin pressure ulcers. Foley placed, norepi titrated as appropriate. Electrolytes repleated as ordered. Son at bedside. Handoff report given, relinquishing care.

## 2021-05-26 NOTE — Progress Notes (Addendum)
Consult for  piv with possibility of vasopressors.  U/S both forearms.  Attempt x1 only vein suitable for 20gx1.88 inch.  Althoug there was good blood return, swelled with flush.  RN verified.  NP made aware. Cath d/c'd.

## 2021-05-26 NOTE — Consult Note (Addendum)
Pharmacy Antibiotic Note  Veronica Anderson is a 75 y.o. female admitted on 05/25/2021 with Aspiration Pneumonia.  Pharmacy has been consulted for Zosyn dosing.  Plan: Zosyn 3.375gm every 8 hrs    Height: 5\' 4"  (162.6 cm) Weight: 80.8 kg (178 lb 2.1 oz) IBW/kg (Calculated) : 54.7  Temp (24hrs), Avg:96.3 F (35.7 C), Min:93.5 F (34.2 C), Max:99.7 F (37.6 C)  Recent Labs  Lab 05/25/21 1310 05/25/21 1543 05/25/21 1834 05/25/21 2034 05/26/21 0145 05/26/21 0302 05/26/21 0500  WBC 7.7  --   --   --   --   --  15.2*  CREATININE 0.83  --   --  1.30*  --  1.15*  --   LATICACIDVEN  --  6.6* >9.0*  --  >9.0*  --  7.6*     Estimated Creatinine Clearance: 43.4 mL/min (A) (by C-G formula based on SCr of 1.15 mg/dL (H)).    Allergies  Allergen Reactions   Ace Inhibitors Cough    Antimicrobials this admission: 11/12 unasyn >> 11/12 11/12 zosyn>>   Dose adjustments this admission: None  Microbiology results: None  Thank you for allowing pharmacy to be a part of this patient's care.  Chinita Greenland PharmD Clinical Pharmacist 05/26/2021

## 2021-05-26 NOTE — Procedures (Signed)
Central Venous Catheter Insertion Procedure Note  Natalin Bible  [id number]  24-Oct-1945  Date:05/26/21  Time:2:03 AM   Provider Performing:Gissel Keilman L Rust-Chester   Procedure: Insertion of Non-tunneled Central Venous [phone]) with US guidance ([id number])   Indication(s) Medication administration and Difficult access  Consent Risks of the procedure as well as the alternatives and risks of each were explained to the patient and/or caregiver.  Consent for the procedure was obtained and is signed in the bedside chart  Anesthesia Topical only with 1% lidocaine   Timeout Verified patient identification, verified procedure, site/side was marked, verified correct patient position, special equipment/implants available, medications/allergies/relevant history reviewed, required imaging and test results available.  Sterile Technique Maximal sterile technique including full sterile barrier drape, hand hygiene, sterile gown, sterile gloves, mask, hair covering, sterile ultrasound probe cover (if used).  Procedure Description Area of catheter insertion was cleaned with chlorhexidine and draped in sterile fashion.  With real-time ultrasound guidance a central venous catheter was placed into the left femoral vein. Nonpulsatile blood flow and easy flushing noted in all ports.  The catheter was sutured in place and sterile dressing applied.  Complications/Tolerance None; patient tolerated the procedure well. Chest X-ray is ordered to verify placement for internal jugular or subclavian cannulation.   Chest x-ray is not ordered for femoral cannulation.  EBL Minimal  Specimen(s) None   Venetia Night, AGACNP-BC Acute Care Nurse Practitioner [facility]   [phone] / [phone] Please see Amion for pager details.

## 2021-05-26 NOTE — Progress Notes (Signed)
Pharmacy Electrolyte Monitoring Consult:  Pharmacy consulted to assist in monitoring and replacing electrolytes in this 75 y.o. female admitted on 05/25/2021 with Respiratory Distress   Labs:  Sodium (mmol/L)  Date Value  05/26/2021 147 (H)  11/14/2015 145 (H)  04/11/2014 143   Potassium (mmol/L)  Date Value  05/26/2021 3.3 (L)  04/11/2014 3.7   Magnesium (mg/dL)  Date Value  05/26/2021 1.7  04/11/2014 2.0   Phosphorus (mg/dL)  Date Value  05/26/2021 3.0   Calcium (mg/dL)  Date Value  05/26/2021 7.8 (L)   Calcium, Total (mg/dL)  Date Value  04/11/2014 8.1 (L)   Albumin (g/dL)  Date Value  05/26/2021 2.2 (L)  11/14/2015 3.7  04/08/2014 2.7 (L)    Assessment/Plan: K 3.3  Mag 1.7  Phos 3.0  Scr 1.15 -Magnesium 2 gm IV x 1 ordered by NP -KCL 10 meq IV x 3 doses ordered by NP -f/u electrolytes with am labs   Tieshia Rettinger A 05/26/2021 9:33 AM

## 2021-05-26 NOTE — Progress Notes (Addendum)
NAME:  Veronica Anderson, MRN:  [id number], DOB:  1946/01/09, LOS: 1 ADMISSION DATE:  05/25/2021, INITIAL CONSULTATION DATE:  05/25/2021 REFERRING MD:  Blake Divine MD, CHIEF COMPLAINT:  SOB  Brief Patient Description   75 y.o with significant PMH as below who presented to the ED with chief complaints of shortness of breath and altered mental status found to be in acute hypoxic respiratory failure due to suspected CAP.   ED Course: On arrival to the ED, she was afebrile with blood pressure  112/70 mm Hg and pulse rate 113 beats/min, RR 17 per minute 95% on nonrebreather mask.  Sats continued to drop to 85% and nonrebreather was placed on HFNC and subsequently on BiPAP. Pertinent Labs in Red/Diagnostics Findings: Na+/ K+: 140/3.8 Glucose: 215 BUN/Cr.: 18/0.83 Calcium: 8.4 AST/ALT:   WBC/ TMAX: 7.7/ afebrile Hgb/Hct: 10.2/32.7 Plts:307 PCT: negative <0.10 Lactic acid: Pending COVID PCR: Negative   Troponin: 30 BNP: 103 VBG result:  pO2 41; pCO2 34; pH 7.37;  HCO3 19.7, %O2 Sat 74.4.   EKG: normal EKG, normal sinus rhythm, unchanged from previous tracings, atrial fibrillation, rate 120bmp. Imaging : CXR: Mild patchy opacity noted in the right perihilar region, developing pneumonia is not excluded.   PCCM consulted for admission and management of acute hypoxia with high risk for intubation. Pertinent  Medical History   Post-menopausal bleeding 11/18/2017  UTI (urinary tract infection) 09/11/2017  Pressure injury of skin 09/11/2017  Chronic kidney disease (CKD), stage IV (severe) (HCC) 07/29/2017  Mild protein-calorie malnutrition ([place]) 07/29/2017  Anemia due to chronic kidney disease    Prolonged Q-T interval on ECG 12/23/2016  CHF (congestive heart failure), NYHA class I ([place]) 11/07/2016  Nocturnal oxygen desaturation 06/26/2015  Anemia in chronic illness 02/23/2015  Asthma, mild intermittent 02/23/2015  Benign essential HTN 97/98/9211  Diastolic heart failure, NYHA class 3  ([place]) 02/23/2015  Benign hypertension with chronic kidney disease, stage III ([place]) 02/23/2015  CN (constipation) 02/23/2015  Diabetes mellitus with neuropathy ([place]) 02/23/2015  Dyslipidemia 02/23/2015  Elevated ferritin 02/23/2015  Gastro-esophageal reflux disease without esophagitis 02/23/2015  LBP (low back pain) 02/23/2015  Morbid obesity due to excess calories ([place]) 02/23/2015  Obstructive apnea 02/23/2015  Osteoarthritis 02/23/2015  Neuropathy 02/23/2015  Perennial allergic rhinitis with seasonal variation 02/23/2015  History of pneumonia 02/23/2015  Vitamin D deficiency 02/23/2015  Cataract 04/16/2010  Background diabetic retinopathy ([place]) 12/18/2009  CVA 1996 with residual deficit GERD Myocardial infarction   Significant Hospital Events: Including procedures, antibiotic start and stop dates in addition to other pertinent events   05/25/21: Patient admitted to stepdown with acute hypoxic respiratory failure secondary to pneumonia  Consults:  PCCM   Procedures:  11/13: Central Line placement   Significant Diagnostic Tests:  05/25/21: Chest Xray>Mild patchy opacity noted in the right perihilar region, developing pneumonia is not excluded. 05/25/21: Noncontrast CT head>No acute intracranial abnormality   Micro Data:  11/12: SARS-CoV-2 PCR> negative 11/12: Influenza PCR> negative 11/12: Blood culture x2> 11/12: Urine Culture> 11/12: MRSA PCR>>  11/12: Strep pneumo urinary antigen>Negative 11/12: Legionella urinary antigen>   Cultures:  SARS-CoV-2 PCR>> negative Influenza PCR>> negative Blood culture x2>>No growth thus far Urine>>no growth MRSA PCR>> negative Strep pneumo urinary antigen>>negative Legionella urinary antigen>>negative Mycoplasma pneumonia>>   Antimicrobials:  11/12 unasyn >> 11/12 11/12 zosyn>>   Interim History / Subjective:  -Remains ob BiPAP -Mentation improving this am. -Levo started last night to keep>65, Bicarb for metabolic  acidosis -I&O Daily total since admit +[phone] -Hypothermic on bear hugger  OBJECTIVE  Blood pressure 106/77, pulse (!) 102, temperature 98.1 F (36.7 C), resp. rate 18, height 5\' 4"  (1.626 m), weight 80.8 kg, SpO2 100 %.    FiO2 (%):  [30 %-98 %] 30 %   Intake/Output Summary (Last 24 hours) at 05/26/2021 0828 Last data filed at 05/26/2021 0600 Gross per 24 hour  Intake 5556.69 ml  Output 900 ml  Net 4656.69 ml   Filed Weights   05/25/21 1310 05/26/21 0500  Weight: 72.3 kg 80.8 kg   Physical Examination  GENERAL: 75 year-old critically ill patient lying in the bed on BiPAP EYES: Pupils equal, round, reactive to light and accommodation. No scleral icterus. Extraocular muscles intact.  HEENT: Head atraumatic, normocephalic. Oropharynx and nasopharynx clear.  NECK:  Supple, no jugular venous distention. No thyroid enlargement, no tenderness.  LUNGS: Decreased breath sounds bilaterally, no wheezing, rales, MODERATE rhonchi bilaterally. No use of accessory muscles of respiration.  CARDIOVASCULAR: S1, S2 normal. No murmurs, rubs, or gallops.  ABDOMEN: Soft, nontender, nondistended. Bowel sounds present. No organomegaly or mass.  EXTREMITIES: No pedal edema, or clubbing.  Mild cyanosis of extremities NEUROLOGIC: Cranial nerves II through XII are intact EXCEPT flaccidity of bilateral upper extremities noted.  Unable to break gravity.  Grimaces to noxious stimulus. Gait not checked.  PSYCHIATRIC: The patient is on BiPAP and altered unable to assess orientation SKIN: No obvious rash, lesion, or ulcer.    Labs/imaging that I havepersonally reviewed  (right click and "Reselect all SmartList Selections" daily)    Labs   CBC: Recent Labs  Lab 05/25/21 1310 05/26/21 0500  WBC 7.7 15.2*  NEUTROABS 5.8  --   HGB 10.2* 9.1*  HCT 32.7* 29.7*  MCV 80.9 80.3  PLT 307 623    Basic Metabolic Panel: Recent Labs  Lab 05/25/21 1310 05/25/21 2034 05/26/21 0302 05/26/21 0500  NA 143  146* 147*  --   K 3.6 3.1* 3.3*  --   CL 107 112* 113*  --   CO2 28 19* 18*  --   GLUCOSE 215* 224* 140*  --   BUN 18 20 16   --   CREATININE 0.83 1.30* 1.15*  --   CALCIUM 8.4* 8.2* 7.8*  --   MG  --  2.1  --  1.7  PHOS  --  4.7*  --  3.0   GFR: Estimated Creatinine Clearance: 43.4 mL/min (A) (by C-G formula based on SCr of 1.15 mg/dL (H)). Recent Labs  Lab 05/25/21 1310 05/25/21 1543 05/25/21 1834 05/26/21 0145 05/26/21 0500  PROCALCITON <0.10  --   --   --  21.00  WBC 7.7  --   --   --  15.2*  LATICACIDVEN  --  6.6* >9.0* >9.0* 7.6*    Liver Function Tests: Recent Labs  Lab 05/26/21 0302  AST 50*  ALT 12  ALKPHOS 49  BILITOT 1.0  PROT 5.8*  ALBUMIN 2.2*   No results for input(s): LIPASE, AMYLASE in the last 168 hours. No results for input(s): AMMONIA in the last 168 hours.  ABG    Component Value Date/Time   PHART 7.31 (L) 05/25/2021 1534   PCO2ART 41 05/25/2021 1534   PO2ART 76 (L) 05/25/2021 1534   HCO3 19.3 (L) 05/26/2021 0100   ACIDBASEDEF 7.2 (H) 05/26/2021 0100   O2SAT 64.7 05/26/2021 0100     Coagulation Profile: Recent Labs  Lab 05/25/21 1534  INR 1.1    Cardiac Enzymes: Recent Labs  Lab 05/26/21 0255  CKTOTAL 153  HbA1C: Hemoglobin A1C  Date/Time Value Ref Range Status  04/09/2014 05:22 AM 6.2 4.2 - 6.3 % Final    Comment:    The American Diabetes Association recommends that a primary goal of therapy should be <7% and that physicians should reevaluate the treatment regimen in patients with HbA1c values consistently >8%.   11/24/2013 02:56 AM 7.1 (H) 4.2 - 6.3 % Final    Comment:    The American Diabetes Association recommends that a primary goal of therapy should be <7% and that physicians should reevaluate the treatment regimen in patients with HbA1c values consistently >8%.    Hgb A1c MFr Bld  Date/Time Value Ref Range Status  06/28/2018 08:30 AM 5.7 (H) 4.8 - 5.6 % Final    Comment:    (NOTE) Pre diabetes:           5.7%-6.4% Diabetes:              >6.4% Glycemic control for   <7.0% adults with diabetes   10/23/2017 11:49 AM 6.3 (H) <5.7 % of total Hgb Final    Comment:    For someone without known diabetes, a hemoglobin  A1c value between 5.7% and 6.4% is consistent with prediabetes and should be confirmed with a  follow-up test. . For someone with known diabetes, a value <7% indicates that their diabetes is well controlled. A1c targets should be individualized based on duration of diabetes, age, comorbid conditions, and other considerations. . This assay result is consistent with an increased risk of diabetes. . Currently, no consensus exists regarding use of hemoglobin A1c for diagnosis of diabetes for children. .     CBG: Recent Labs  Lab 05/26/21 0507 05/26/21 0735  GLUCAP 147* 151*    Allergies Allergies  Allergen Reactions   Ace Inhibitors Cough     Home Medications  Prior to Admission medications   Medication Sig Start Date End Date Taking? Authorizing Provider  amLODipine (NORVASC) 10 MG tablet Take 1 tablet (10 mg total) by mouth daily. 10/23/17  Yes Poulose, Bethel Born, NP  aspirin EC 81 MG tablet Take 81 mg by mouth at bedtime.   Yes [provider]  cloNIDine (CATAPRES - DOSED IN MG/24 HR) 0.3 mg/24hr patch PLACE 1 PATCH (0.3 MG TOTAL) ONTO THE SKIN ONCE A WEEK. Patient taking differently: Place 0.3 mg onto the skin every Sunday. 01/28/18  Yes Sowles, Drue Stager, MD  desonide (DESOWEN) 0.05 % ointment Apply 1 application topically 2 (two) times daily. 10/23/17  Yes Poulose, Bethel Born, NP  famotidine (PEPCID) 20 MG tablet Take 20 mg by mouth 2 (two) times daily. 04/30/21  Yes [provider]  ferrous sulfate 325 (65 FE) MG tablet Take 1 tablet (325 mg total) by mouth 2 (two) times daily with a meal. 09/15/17  Yes Gladstone Lighter, MD  furosemide (LASIX) 40 MG tablet Take 1 tablet (40 mg total) by mouth daily. 10/23/17  Yes Poulose, Bethel Born, NP   gabapentin (NEURONTIN) 300 MG capsule TAKE 1 CAPSULE BY MOUTH THREE TIMES A DAY Patient taking differently: Take 300 mg by mouth 3 (three) times daily. 12/23/17  Yes Sowles, Drue Stager, MD  hydrALAZINE (APRESOLINE) 50 MG tablet Take 50 mg by mouth 3 (three) times daily. 04/30/21  Yes [provider]  medroxyPROGESTERone (PROVERA) 10 MG tablet Take 2 tablets (20 mg total) by mouth daily. 11/18/17  Yes Gae Dry, MD  metoprolol tartrate (LOPRESSOR) 100 MG tablet Take 100 mg by mouth daily. 04/26/21  Yes [provider]  montelukast (SINGULAIR) 10 MG tablet Take 1 tablet (10 mg total) by mouth daily. 10/23/17  Yes Poulose, Bethel Born, NP  nystatin cream (MYCOSTATIN) Apply 1 application topically 2 (two) times daily. 05/09/21  Yes [provider]  pravastatin (PRAVACHOL) 40 MG tablet TAKE 1 TABLET BY MOUTH EVERY DAY 01/05/18  Yes Poulose, Bethel Born, NP  glucose blood (ACCU-CHEK AVIVA PLUS) test strip 1 each by Other route 2 (two) times daily. Use as instructed 08/06/17   Steele Sizer, MD  glucose blood (ACCU-CHEK AVIVA PLUS) test strip CHECK BLOOD SUGAR TWICE DAILY 10/23/17   Poulose, Bethel Born, NP  Insulin Glargine (LANTUS SOLOSTAR) 100 UNIT/ML Solostar Pen INJECT 35 UNITS SUBCUTANEOUSLY DAILY Patient taking differently: Inject 28 Units into the skin daily. 10/23/17   Poulose, Bethel Born, NP  insulin lispro (HUMALOG KWIKPEN) 100 UNIT/ML KiwkPen INJECT 5 UNITS SUBCUTANEOUSLY BEFORE MEALS; Dx E11.40, LON 99 months 10/23/17   Poulose, Bethel Born, NP  Insulin Pen Needle (NOVOFINE) 32G X 6 MM MISC Inject 1 application into the skin 4 (four) times daily. 10/23/17   Poulose, Bethel Born, NP  Vitamin D, Ergocalciferol, (DRISDOL) 50000 units CAPS capsule TAKE ONE CAPSULE BY MOUTH ONCE A WEEK On Sunday Patient not taking: Reported on 05/25/2021 10/23/17   Fredderick Severance, NP    Scheduled Meds:  budesonide (PULMICORT) nebulizer solution  0.25 mg Nebulization BID    Chlorhexidine Gluconate Cloth  6 each Topical Daily   insulin aspart  0-9 Units Subcutaneous Q4H   Continuous Infusions:  sodium chloride     albumin human 60 mL/hr at 05/26/21 0600   magnesium sulfate bolus IVPB     norepinephrine (LEVOPHED) Adult infusion 5 mcg/min (05/26/21 0600)   piperacillin-tazobactam (ZOSYN)  IV 12.5 mL/hr at 05/26/21 0600   potassium chloride 10 mEq (05/26/21 0755)    sodium bicarbonate (isotonic) infusion in sterile water 150 mL/hr at 05/26/21 0600   PRN Meds:.docusate sodium, ipratropium-albuterol, polyethylene glycol   Resolved Hospital Problem list     ASSESSMENT & PLAN  Acute Hypoxic Respiratory Failure secondary to  CAP and Possible Aspiration Pneumonia PMHx: Obstructive Sleep Apnea on CPAP -Supplemental O2 as needed to maintain O2 saturations 88 to 92% -BiPAP, wean as tolerated -High risk for intubation -Follow intermittent ABG and chest x-ray as needed -Ensure adequate pulmonary hygiene  -Daily CBC, monitor fever curve -Budesonide inhaler/nebs BID, bronchodilators PRN  Sepsis with septic shock Secondary to Pneumonia Lactic: 6.6>9, Baseline PCT: <0.10>21.0, UA: neg, CXR: RL Pneumonia Initial interventions/workup included: 2.5 L of NS, Abx: Unasyn -Follow blood and Urine cultures, trend lactic/ PCT -Strep pneumo and Legionella antigen  pending -monitor WBC/ fever curve -IV antibiotics: Continue Zosyn -IVF hydration as needed -Continue vasopressors to maintain MAP> 65, norepinephrine -Strict I/O's   Acute Metabolic Encephalopathy due to hypoxia Hx of CVA in 1996 -CT Head negative for acute intracranial abnormality -Provide supportive care -Continue aspirin -Hold sedatives as able   Chronic Diastolic heart failure, NYHA class 3 (BNP only 103 no evidence of volume overload, appears dehydrated) Hypertension Atrial fibrillation on monitor Hx: MI, HLD  -Continuous cardiac monitoring -Maintain MAP greater than 65 -Serial EKG -Trend  troponin -Hold Lasix, clonidine, metoprolol, hydralazine and amlodipine for now, resume once BP stable -Continue pravastatin 40 mg daily -Repeat 2D Echocardiogram   Hx of CKD Stage III BUN/Cr 16/1.15 this am Lactic Acidosis HAGMA Hypokalemia -Trend Lactic -Continue Bicarb gtt -Monitor I&O's / urinary output -Follow BMP -Ensure adequate renal perfusion -Avoid nephrotoxic agents as  able -Replace electrolytes as indicated   Diabetes mellitus -CBGs -Sliding scale insulin -Follow ICU hyper/hypoglycemia protocol -Hold home medication for now    Best practice (right click and "Reselect all SmartList Selections" daily)  Diet:  NPO Pain/Anxiety/Delirium protocol (if indicated): No VAP protocol (if indicated): Not indicated DVT prophylaxis: LMWH GI prophylaxis: H2B Glucose control:  SSI Yes Central venous access:  Yes, and it is still needed Arterial line:  N/A Foley:  Yes, and it is still needed Mobility:  bed rest  PT consulted: N/A Last date of multidisciplinary goals of care discussion [11/13] Code Status:  full code Disposition: ICU  Critical care time: [place], DNP, FNP-C, AGACNP-BC Acute Care Nurse Practitioner  [facility] Pager: [phone] [place] [facility]

## 2021-05-26 NOTE — Progress Notes (Signed)
*  PRELIMINARY RESULTS* Echocardiogram 2D Echocardiogram has been performed.  Veronica Anderson 05/26/2021, 4:16 PM

## 2021-05-27 DIAGNOSIS — E440 Moderate protein-calorie malnutrition: Secondary | ICD-10-CM | POA: Insufficient documentation

## 2021-05-27 DIAGNOSIS — J9601 Acute respiratory failure with hypoxia: Secondary | ICD-10-CM | POA: Diagnosis not present

## 2021-05-27 DIAGNOSIS — N179 Acute kidney failure, unspecified: Secondary | ICD-10-CM | POA: Diagnosis not present

## 2021-05-27 DIAGNOSIS — J690 Pneumonitis due to inhalation of food and vomit: Secondary | ICD-10-CM | POA: Diagnosis not present

## 2021-05-27 LAB — ECHOCARDIOGRAM COMPLETE
AR max vel: 2.56 cm2
AV Peak grad: 4.2 mmHg
Ao pk vel: 1.03 m/s
Area-P 1/2: 12.04 cm2
Height: 64 in
S' Lateral: 3 cm
Weight: 2850.11 oz

## 2021-05-27 LAB — CBC
HCT: 22.6 % — ABNORMAL LOW (ref 36.0–46.0)
Hemoglobin: 7 g/dL — ABNORMAL LOW (ref 12.0–15.0)
MCH: 24.8 pg — ABNORMAL LOW (ref 26.0–34.0)
MCHC: 31 g/dL (ref 30.0–36.0)
MCV: 80.1 fL (ref 80.0–100.0)
Platelets: 172 10*3/uL (ref 150–400)
RBC: 2.82 MIL/uL — ABNORMAL LOW (ref 3.87–5.11)
RDW: 20.9 % — ABNORMAL HIGH (ref 11.5–15.5)
WBC: 22.9 10*3/uL — ABNORMAL HIGH (ref 4.0–10.5)
nRBC: 0 % (ref 0.0–0.2)

## 2021-05-27 LAB — GLUCOSE, CAPILLARY
Glucose-Capillary: 104 mg/dL — ABNORMAL HIGH (ref 70–99)
Glucose-Capillary: 136 mg/dL — ABNORMAL HIGH (ref 70–99)
Glucose-Capillary: 85 mg/dL (ref 70–99)
Glucose-Capillary: 86 mg/dL (ref 70–99)
Glucose-Capillary: 89 mg/dL (ref 70–99)
Glucose-Capillary: 91 mg/dL (ref 70–99)
Glucose-Capillary: 96 mg/dL (ref 70–99)

## 2021-05-27 LAB — HEPATIC FUNCTION PANEL
ALT: 10 U/L (ref 0–44)
AST: 19 U/L (ref 15–41)
Albumin: 2.7 g/dL — ABNORMAL LOW (ref 3.5–5.0)
Alkaline Phosphatase: 46 U/L (ref 38–126)
Bilirubin, Direct: 0.4 mg/dL — ABNORMAL HIGH (ref 0.0–0.2)
Indirect Bilirubin: 1.4 mg/dL — ABNORMAL HIGH (ref 0.3–0.9)
Total Bilirubin: 1.8 mg/dL — ABNORMAL HIGH (ref 0.3–1.2)
Total Protein: 5.5 g/dL — ABNORMAL LOW (ref 6.5–8.1)

## 2021-05-27 LAB — URINE CULTURE: Culture: <10000 — AB

## 2021-05-27 LAB — PREPARE RBC (CROSSMATCH)

## 2021-05-27 LAB — BASIC METABOLIC PANEL
Anion gap: 14 (ref 5–15)
BUN: 14 mg/dL (ref 8–23)
CO2: 32 mmol/L (ref 22–32)
Calcium: 7.5 mg/dL — ABNORMAL LOW (ref 8.9–10.3)
Chloride: 100 mmol/L (ref 98–111)
Creatinine, Ser: 0.95 mg/dL (ref 0.44–1.00)
GFR, Estimated: >60 mL/min (ref >60)
Glucose, Bld: 100 mg/dL — ABNORMAL HIGH (ref 70–99)
Potassium: 2.9 mmol/L — ABNORMAL LOW (ref 3.5–5.1)
Sodium: 146 mmol/L — ABNORMAL HIGH (ref 135–145)

## 2021-05-27 LAB — HEMOGLOBIN A1C
Hgb A1c MFr Bld: 6.6 % — ABNORMAL HIGH (ref 4.8–5.6)
Mean Plasma Glucose: 143 mg/dL

## 2021-05-27 LAB — TYPE AND SCREEN
ABO/RH(D): B POS
Antibody Screen: NEGATIVE

## 2021-05-27 LAB — PHOSPHORUS
Phosphorus: 1.7 mg/dL — ABNORMAL LOW (ref 2.5–4.6)
Phosphorus: 2.4 mg/dL — ABNORMAL LOW (ref 2.5–4.6)

## 2021-05-27 LAB — POTASSIUM: Potassium: 2.9 mmol/L — ABNORMAL LOW (ref 3.5–5.1)

## 2021-05-27 LAB — LEGIONELLA PNEUMOPHILA SEROGP 1 UR AG: L. pneumophila Serogp 1 Ur Ag: NEGATIVE

## 2021-05-27 LAB — MAGNESIUM
Magnesium: 1.9 mg/dL (ref 1.7–2.4)
Magnesium: 1.7 mg/dL (ref 1.7–2.4)

## 2021-05-27 LAB — PROCALCITONIN: Procalcitonin: 17.56 ng/mL

## 2021-05-27 LAB — LACTIC ACID, PLASMA: Lactic Acid, Venous: 1.9 mmol/L (ref 0.5–1.9)

## 2021-05-27 MED ORDER — MEDROXYPROGESTERONE ACETATE 10 MG PO TABS
20.0000 mg | ORAL_TABLET | Freq: Every day | ORAL | Status: DC
  Administered 2021-05-29 – 2021-05-30 (×2): 20 mg via ORAL
  Filled 2021-05-27 (×4): qty 2

## 2021-05-27 MED ORDER — CHLORHEXIDINE GLUCONATE 0.12 % MT SOLN
15.0000 mL | Freq: Two times a day (BID) | OROMUCOSAL | Status: DC
  Administered 2021-05-27 – 2021-06-02 (×14): 15 mL via OROMUCOSAL
  Filled 2021-05-27 (×10): qty 15

## 2021-05-27 MED ORDER — POTASSIUM CHLORIDE 10 MEQ/50ML IV SOLN
10.0000 meq | INTRAVENOUS | Status: AC
  Administered 2021-05-27 (×3): 10 meq via INTRAVENOUS
  Filled 2021-05-27 (×3): qty 50

## 2021-05-27 MED ORDER — MAGNESIUM SULFATE 2 GM/50ML IV SOLN
2.0000 g | Freq: Once | INTRAVENOUS | Status: AC
  Administered 2021-05-27: 2 g via INTRAVENOUS
  Filled 2021-05-27: qty 50

## 2021-05-27 MED ORDER — SODIUM CHLORIDE 0.9% IV SOLUTION: Freq: Once | INTRAVENOUS | Status: AC

## 2021-05-27 MED ORDER — POTASSIUM PHOSPHATES 15 MMOLE/5ML IV SOLN
20.0000 mmol | Freq: Once | INTRAVENOUS | Status: AC
  Administered 2021-05-27: 20 mmol via INTRAVENOUS
  Filled 2021-05-27: qty 6.67

## 2021-05-27 MED ORDER — ORAL CARE MOUTH RINSE
15.0000 mL | Freq: Two times a day (BID) | OROMUCOSAL | Status: DC
  Administered 2021-05-27 – 2021-06-02 (×13): 15 mL via OROMUCOSAL

## 2021-05-27 MED ORDER — POTASSIUM CHLORIDE 10 MEQ/50ML IV SOLN
10.0000 meq | INTRAVENOUS | Status: AC
  Administered 2021-05-28 (×8): 10 meq via INTRAVENOUS
  Filled 2021-05-27 (×8): qty 50

## 2021-05-27 NOTE — Progress Notes (Signed)
NAME:  Veronica Anderson, MRN:  [id number], DOB:  May 03, 1946, LOS: 2 ADMISSION DATE:  05/25/2021, INITIAL CONSULTATION DATE:  05/25/2021 REFERRING MD:  Blake Divine MD, CHIEF COMPLAINT:  SOB  Brief Patient Description   75 y.o with significant PMH as below who presented to the ED with chief complaints of shortness of breath and altered mental status found to be in acute hypoxic respiratory failure due to suspected CAP.   ED Course: On arrival to the ED, she was afebrile with blood pressure  112/70 mm Hg and pulse rate 113 beats/min, RR 17 per minute 95% on nonrebreather mask.  Sats continued to drop to 85% and nonrebreather was placed on HFNC and subsequently on BiPAP. Pertinent Labs in Red/Diagnostics Findings: Na+/ K+: 140/3.8 Glucose: 215 BUN/Cr.: 18/0.83 Calcium: 8.4 AST/ALT:   WBC/ TMAX: 7.7/ afebrile Hgb/Hct: 10.2/32.7 Plts:307 PCT: negative <0.10 Lactic acid: Pending COVID PCR: Negative   Troponin: 30 BNP: 103 VBG result:  pO2 41; pCO2 34; pH 7.37;  HCO3 19.7, %O2 Sat 74.4.   EKG: normal EKG, normal sinus rhythm, unchanged from previous tracings, atrial fibrillation, rate 120bmp. Imaging : CXR: Mild patchy opacity noted in the right perihilar region, developing pneumonia is not excluded.   PCCM consulted for admission and management of acute hypoxia with high risk for intubation. Pertinent  Medical History   Post-menopausal bleeding 11/18/2017  UTI (urinary tract infection) 09/11/2017  Pressure injury of skin 09/11/2017  Chronic kidney disease (CKD), stage IV (severe) (HCC) 07/29/2017  Mild protein-calorie malnutrition ([place]) 07/29/2017  Anemia due to chronic kidney disease    Prolonged Q-T interval on ECG 12/23/2016  CHF (congestive heart failure), NYHA class I ([place]) 11/07/2016  Nocturnal oxygen desaturation 06/26/2015  Anemia in chronic illness 02/23/2015  Asthma, mild intermittent 02/23/2015  Benign essential HTN 99/24/2683  Diastolic heart failure, NYHA class 3  ([place]) 02/23/2015  Benign hypertension with chronic kidney disease, stage III ([place]) 02/23/2015  CN (constipation) 02/23/2015  Diabetes mellitus with neuropathy ([place]) 02/23/2015  Dyslipidemia 02/23/2015  Elevated ferritin 02/23/2015  Gastro-esophageal reflux disease without esophagitis 02/23/2015  LBP (low back pain) 02/23/2015  Morbid obesity due to excess calories ([place]) 02/23/2015  Obstructive apnea 02/23/2015  Osteoarthritis 02/23/2015  Neuropathy 02/23/2015  Perennial allergic rhinitis with seasonal variation 02/23/2015  History of pneumonia 02/23/2015  Vitamin D deficiency 02/23/2015  Cataract 04/16/2010  Background diabetic retinopathy ([place]) 12/18/2009  CVA 1996 with residual deficit GERD Myocardial infarction   Significant Hospital Events: Including procedures, antibiotic start and stop dates in addition to other pertinent events   05/25/21: Patient admitted to stepdown with acute hypoxic respiratory failure secondary to pneumonia 05/27/21: Weaned off Levophed this morning. BiPAP qhs.  To receive 1 unit pRBCs for Hgb 7.0.  Obtain Speech evaluation/PT/OT. Bicarb gtt discontinued  Consults:  PCCM   Procedures:  11/13: Central Line placement   Significant Diagnostic Tests:  05/25/21: Chest Xray>Mild patchy opacity noted in the right perihilar region, developing pneumonia is not excluded. 05/25/21: Noncontrast CT head>No acute intracranial abnormality 05/25/21: CT Abdomen & Pelvis>> 1. Nodular and consolidative airspace opacity in the right lower lobe with air bronchograms, suspicious for pneumonia. Minimal airspace disease in the right middle lobe. In the setting of vomiting, consider aspiration. Trace right pleural effusion/thickening. 2. Distended gallbladder with intraluminal gallstones. No convincing pericholecystic inflammation by CT. If there is clinical concern for acute cholecystitis, recommend right upper quadrant ultrasound. 3. Absent renal excretion on delayed  phase imaging suggesting underlying renal dysfunction. 4. Moderately distended urinary bladder with layering  density in the bladder may represent bladder stones or hyperdense debris. 5. Uterine fibroids. 6. Colonic diverticulosis without diverticulitis 05/26/21: RUQ Abdominal US>>1. Large amount of biliary sludge and tiny gallstones lying dependently in the gallbladder. No findings to suggest an acute cholecystitis at this time.  Micro Data:  11/12: SARS-CoV-2 PCR> negative 11/12: Influenza PCR> negative 11/12: Blood culture x2> no growth to date 11/12: Urine Culture> 11/12: MRSA PCR>>Positive 11/12: Strep pneumo urinary antigen>Negative 11/12: Legionella urinary antigen> 11/12: Mycoplasma pneumonia>>   Antimicrobials:  11/12 unasyn >> 11/12 11/12 zosyn>>   Interim History / Subjective:  -No significant events reported overnight -Placed on BiPAP overnight ~ weaned to [place] this morning and tolerating -Levophed weaned off early this morning @ 05:00 -Afebrile, hemodynamically stable -Leukocytosis increased to 22.9 (15.2), PCT is currently pending -Will add on LFT's to BMP due to previous biliary sludge and gallstones ~ Abdominal exam is benign -Creatinine improved to 4.09 -Metabolic acidosis resolved ~ will d/c Bicarb gtt today -Mild Hypernatremia, likely iatrogenic from Bicarb gtt -Hgb 7 this morning (9.1), no overt bleeding noted by nursing ~ to receive 1 unit pRBC's -Pt is awake and alert, will obtain Speech evaluation/PT/OT  OBJECTIVE   Blood pressure 99/61, pulse 79, temperature 99.1 F (37.3 C), temperature source Axillary, resp. rate (!) 21, height 5\' 4"  (1.626 m), weight 86.2 kg, SpO2 100 %.    FiO2 (%):  [30 %] 30 %   Intake/Output Summary (Last 24 hours) at 05/27/2021 0827 Last data filed at 05/27/2021 0700 Gross per 24 hour  Intake 3757.41 ml  Output 1630 ml  Net 2127.41 ml    Filed Weights   05/25/21 1310 05/26/21 0500 05/27/21 0500  Weight: 72.3 kg 80.8 kg  86.2 kg   Physical Examination  GENERAL: 75 year-old acute on chronically ill patient, lying in the bed on , in NAD EYES: Pupils equal, round, reactive to light and accommodation. No scleral icterus. Extraocular muscles intact.  HEENT: Head atraumatic, normocephalic. Oropharynx and nasopharynx clear.  NECK:  Supple, no jugular venous distention. No thyroid enlargement, no tenderness.  LUNGS: Clear diminished breath sounds bilaterally, no wheezing or rales. No use of accessory muscles of respiration.  CARDIOVASCULAR: Regular rate & rhythm, S1, S2 normal. No murmurs, rubs, or gallops.  ABDOMEN: Obese, Soft, nontender, nondistended. Bowel sounds present. No organomegaly or mass.  EXTREMITIES: No pedal edema, or clubbing.   NEUROLOGIC: Awake and alert, oriented to person and place.  Follows commands, generalized weakness (hx of weakness to Right side from prior CVA), no focal deficits SKIN: No obvious rash, lesion, or ulcer.    Labs/imaging that I havepersonally reviewed  (right click and "Reselect all SmartList Selections" daily)    Labs   CBC: Recent Labs  Lab 05/25/21 1310 05/26/21 0500 05/27/21 0409  WBC 7.7 15.2* 22.9*  NEUTROABS 5.8  --   --   HGB 10.2* 9.1* 7.0*  HCT 32.7* 29.7* 22.6*  MCV 80.9 80.3 80.1  PLT 307 284 172     Basic Metabolic Panel: Recent Labs  Lab 05/25/21 1310 05/25/21 2034 05/26/21 0302 05/26/21 0500 05/26/21 0952  NA 143 146* 147*  --   --   K 3.6 3.1* 3.3*  --  3.8  CL 107 112* 113*  --   --   CO2 28 19* 18*  --   --   GLUCOSE 215* 224* 140*  --   --   BUN 18 20 16   --   --   CREATININE 0.83 1.30*  1.15*  --   --   CALCIUM 8.4* 8.2* 7.8*  --   --   MG  --  2.1  --  1.7  --   PHOS  --  4.7*  --  3.0  --     GFR: Estimated Creatinine Clearance: 44.9 mL/min (A) (by C-G formula based on SCr of 1.15 mg/dL (H)). Recent Labs  Lab 05/25/21 1310 05/25/21 1543 05/25/21 1834 05/26/21 0145 05/26/21 0500 05/27/21 0409  PROCALCITON <0.10  --    --   --  21.00  --   WBC 7.7  --   --   --  15.2* 22.9*  LATICACIDVEN  --    < > >9.0* >9.0* 7.6* 1.9   < > = values in this interval not displayed.     Liver Function Tests: Recent Labs  Lab 05/26/21 0302  AST 50*  ALT 12  ALKPHOS 49  BILITOT 1.0  PROT 5.8*  ALBUMIN 2.2*    No results for input(s): LIPASE, AMYLASE in the last 168 hours. No results for input(s): AMMONIA in the last 168 hours.  ABG    Component Value Date/Time   PHART 7.31 (L) 05/25/2021 1534   PCO2ART 41 05/25/2021 1534   PO2ART 76 (L) 05/25/2021 1534   HCO3 19.3 (L) 05/26/2021 0100   ACIDBASEDEF 7.2 (H) 05/26/2021 0100   O2SAT 64.7 05/26/2021 0100      Coagulation Profile: Recent Labs  Lab 05/25/21 1534  INR 1.1     Cardiac Enzymes: Recent Labs  Lab 05/26/21 0255  CKTOTAL 153     HbA1C: Hemoglobin A1C  Date/Time Value Ref Range Status  04/09/2014 05:22 AM 6.2 4.2 - 6.3 % Final    Comment:    The American Diabetes Association recommends that a primary goal of therapy should be <7% and that physicians should reevaluate the treatment regimen in patients with HbA1c values consistently >8%.   11/24/2013 02:56 AM 7.1 (H) 4.2 - 6.3 % Final    Comment:    The American Diabetes Association recommends that a primary goal of therapy should be <7% and that physicians should reevaluate the treatment regimen in patients with HbA1c values consistently >8%.    Hgb A1c MFr Bld  Date/Time Value Ref Range Status  05/25/2021 03:43 PM 6.6 (H) 4.8 - 5.6 % Final    Comment:    (NOTE)         Prediabetes: 5.7 - 6.4         Diabetes: >6.4         Glycemic control for adults with diabetes: <7.0   06/28/2018 08:30 AM 5.7 (H) 4.8 - 5.6 % Final    Comment:    (NOTE) Pre diabetes:          5.7%-6.4% Diabetes:              >6.4% Glycemic control for   <7.0% adults with diabetes     CBG: Recent Labs  Lab 05/26/21 1555 05/26/21 1955 05/26/21 2325 05/27/21 0319 05/27/21 0742  GLUCAP 104*  90 86 104* 86     Allergies Allergies  Allergen Reactions   Ace Inhibitors Cough      Home Medications  Prior to Admission medications   Medication Sig Start Date End Date Taking? Authorizing Provider  amLODipine (NORVASC) 10 MG tablet Take 1 tablet (10 mg total) by mouth daily. 10/23/17  Yes Poulose, Bethel Born, NP  aspirin EC 81 MG tablet Take 81 mg by mouth at bedtime.  Yes [provider]  cloNIDine (CATAPRES - DOSED IN MG/24 HR) 0.3 mg/24hr patch PLACE 1 PATCH (0.3 MG TOTAL) ONTO THE SKIN ONCE A WEEK. Patient taking differently: Place 0.3 mg onto the skin every Sunday. 01/28/18  Yes Sowles, Drue Stager, MD  desonide (DESOWEN) 0.05 % ointment Apply 1 application topically 2 (two) times daily. 10/23/17  Yes Poulose, Bethel Born, NP  famotidine (PEPCID) 20 MG tablet Take 20 mg by mouth 2 (two) times daily. 04/30/21  Yes [provider]  ferrous sulfate 325 (65 FE) MG tablet Take 1 tablet (325 mg total) by mouth 2 (two) times daily with a meal. 09/15/17  Yes Gladstone Lighter, MD  furosemide (LASIX) 40 MG tablet Take 1 tablet (40 mg total) by mouth daily. 10/23/17  Yes Poulose, Bethel Born, NP  gabapentin (NEURONTIN) 300 MG capsule TAKE 1 CAPSULE BY MOUTH THREE TIMES A DAY Patient taking differently: Take 300 mg by mouth 3 (three) times daily. 12/23/17  Yes Sowles, Drue Stager, MD  hydrALAZINE (APRESOLINE) 50 MG tablet Take 50 mg by mouth 3 (three) times daily. 04/30/21  Yes [provider]  medroxyPROGESTERone (PROVERA) 10 MG tablet Take 2 tablets (20 mg total) by mouth daily. 11/18/17  Yes Gae Dry, MD  metoprolol tartrate (LOPRESSOR) 100 MG tablet Take 100 mg by mouth daily. 04/26/21  Yes [provider]  montelukast (SINGULAIR) 10 MG tablet Take 1 tablet (10 mg total) by mouth daily. 10/23/17  Yes Poulose, Bethel Born, NP  nystatin cream (MYCOSTATIN) Apply 1 application topically 2 (two) times daily. 05/09/21  Yes [provider]  pravastatin  (PRAVACHOL) 40 MG tablet TAKE 1 TABLET BY MOUTH EVERY DAY 01/05/18  Yes Poulose, Bethel Born, NP  glucose blood (ACCU-CHEK AVIVA PLUS) test strip 1 each by Other route 2 (two) times daily. Use as instructed 08/06/17   Steele Sizer, MD  glucose blood (ACCU-CHEK AVIVA PLUS) test strip CHECK BLOOD SUGAR TWICE DAILY 10/23/17   Poulose, Bethel Born, NP  Insulin Glargine (LANTUS SOLOSTAR) 100 UNIT/ML Solostar Pen INJECT 35 UNITS SUBCUTANEOUSLY DAILY Patient taking differently: Inject 28 Units into the skin daily. 10/23/17   Poulose, Bethel Born, NP  insulin lispro (HUMALOG KWIKPEN) 100 UNIT/ML KiwkPen INJECT 5 UNITS SUBCUTANEOUSLY BEFORE MEALS; Dx E11.40, LON 99 months 10/23/17   Poulose, Bethel Born, NP  Insulin Pen Needle (NOVOFINE) 32G X 6 MM MISC Inject 1 application into the skin 4 (four) times daily. 10/23/17   Poulose, Bethel Born, NP  Vitamin D, Ergocalciferol, (DRISDOL) 50000 units CAPS capsule TAKE ONE CAPSULE BY MOUTH ONCE A WEEK On Sunday Patient not taking: Reported on 05/25/2021 10/23/17   Fredderick Severance, NP    Scheduled Meds:  budesonide (PULMICORT) nebulizer solution  0.25 mg Nebulization BID   Chlorhexidine Gluconate Cloth  6 each Topical Daily   enoxaparin (LOVENOX) injection  40 mg Subcutaneous Q24H   insulin aspart  0-9 Units Subcutaneous Q4H   mupirocin ointment  1 application Nasal BID   thiamine injection  100 mg Intravenous Q24H   Continuous Infusions:  sodium chloride Stopped (05/27/21 0529)   norepinephrine (LEVOPHED) Adult infusion Stopped (05/27/21 0538)   piperacillin-tazobactam (ZOSYN)  IV 12.5 mL/hr at 05/27/21 0700    sodium bicarbonate (isotonic) infusion in sterile water 150 mL/hr at 05/27/21 0700   PRN Meds:.docusate sodium, ipratropium-albuterol, polyethylene glycol   Resolved Hospital Problem list     ASSESSMENT & PLAN   Acute Hypoxic Respiratory Failure secondary to  CAP and Possible Aspiration Pneumonia PMHx: Obstructive Sleep  Apnea on  CPAP -Supplemental O2 as needed to maintain O2 saturations 88 to 92% -BiPAP, wean as tolerated -Requires BiPAP qhs -Follow intermittent ABG and chest x-ray as needed -Ensure adequate pulmonary hygiene  -Daily CBC, monitor fever curve -Budesonide inhaler/nebs BID, bronchodilators PRN  Septic Shock ~ resolved Chronic Diastolic heart failure, NYHA class 3 (BNP only 103 no evidence of volume overload, appears dehydrated) Hypertension Atrial fibrillation on monitor Hx: MI, HLD -Continuous cardiac monitoring -Maintain MAP >65 -Vasopressors as needed to maintain MAP goal -Lactic acid has normalized -Trend HS Troponin until peaked (30 ~ 29) -Echocardiogram: LVEF 74-08%, normal diastolic parameters, normal RV function -Hold Lasix, clonidine, metoprolol, hydralazine and amlodipine for now, can resume once BP stable (just recently weaned off vasopressors this morning, so will continue to hold for today 11/14) -Continue pravastatin 40 mg daily  Severe Sepsis secondary to Pneumonia Lactic: 6.6>9, Baseline PCT: <0.10>21.0, UA: neg, CXR: RL Pneumonia Initial interventions/workup included: 2.5 L of NS, Abx: Unasyn -Monitor fever curve -Trend WBC's & Procalcitonin -Follow cultures as above -Continue empiric Zosyn pending cultures & sensitivities   Acute Metabolic Encephalopathy due to hypoxia Hx of CVA in 1996 -CT Head negative for acute intracranial abnormality -Provide supportive care. -Promote normal sleep wake/cycle -Avoid sedating meds as able -Continue aspirin   CKD Stage III Lactic Acidosis ~ resolved HAGMA Hypokalemia Mild Hypernatremia, likely due to Bicarb gtt -Monitor I&O's / urinary output -Follow BMP -Ensure adequate renal perfusion -Avoid nephrotoxic agents as able -Replace electrolytes as indicated -Pharmacy following for assistance with electrolyte replacement -D/c Bicarb gtt 11/14   Diabetes mellitus -CBG's q4h; Target range of 140 to 180 -SSI -Follow ICU  Hypo/Hyperglycemia protocol  Anemia of Chronic Disease -Monitor for S/Sx of bleeding -Trend CBC -Lovenox for VTE Prophylaxis  -Transfuse for Hgb <7 -To receive 1 unit pRBC's on 11/14     Best practice (right click and "Reselect all SmartList Selections" daily)  Diet:  NPO, speech evaluation pending Pain/Anxiety/Delirium protocol (if indicated): No VAP protocol (if indicated): Not indicated DVT prophylaxis: LMWH GI prophylaxis: H2B Glucose control:  SSI Yes Central venous access:  Will attempt to obtain [facility] access and remove central line 11/14 Arterial line:  N/A Foley:  Yes, and it is still needed Mobility:  OOB as tolerated  PT consulted: yes Last date of multidisciplinary goals of care discussion [11/14] Code Status:  full code Disposition: ICU  Updated pt's son at bedside 05/27/21. All questions answered  Critical care time: 38 minutes    Darel Hong, AGACNP-BC [facility] Prefer epic messenger for cross cover needs If after hours, please call E-link

## 2021-05-27 NOTE — Consult Note (Signed)
PHARMACY CONSULT NOTE  Pharmacy Consult for Electrolyte Monitoring and Replacement   Recent Labs: Potassium (mmol/L)  Date Value  05/27/2021 2.9 (L)  04/11/2014 3.7   Magnesium (mg/dL)  Date Value  05/27/2021 1.9  04/11/2014 2.0   Calcium (mg/dL)  Date Value  05/27/2021 7.5 (L)   Calcium, Total (mg/dL)  Date Value  04/11/2014 8.1 (L)   Albumin (g/dL)  Date Value  05/26/2021 2.2 (L)  11/14/2015 3.7  04/08/2014 2.7 (L)   Phosphorus (mg/dL)  Date Value  05/27/2021 1.7 (L)   Sodium (mmol/L)  Date Value  05/27/2021 146 (H)  11/14/2015 145 (H)  04/11/2014 143   Assessment: Patient is a 75 y/o F with medical history including CKD, CHF, asthma, HTN, diabetes, GERD, remote hx CVA, MI who is admitted with acute hypoxic respiratory failure secondary to aspiration pneumonia. Pharmacy consulted to assist with electrolyte monitoring and replacement as indicated.  Nutrition: NPO MIVF: Isotonic bicarbonate gtt at 150 cc/hr  Goal of Therapy:  Electrolytes within normal limits  Plan:  --Na 146, possibly iatrogenic from sodium bicarbonate infusion --K 2.9, Phos 1.7; IV potassium phosphate 20 mmol x 1 + IV Kcl 10 mEq x 3 --Follow-up electrolytes with AM labs tomorrow  Veronica Anderson 05/27/2021 9:32 AM

## 2021-05-27 NOTE — Evaluation (Signed)
Clinical/Bedside Swallow Evaluation Patient Details  Name: Veronica Anderson MRN: [id number] Date of Birth: 07/14/1946  Today's Date: 05/27/2021 Time: SLP Start Time (ACUTE ONLY): 28 SLP Stop Time (ACUTE ONLY): 1038 SLP Time Calculation (min) (ACUTE ONLY): 20 min  Past Medical History:  Past Medical History:  Diagnosis Date   Allergy    Anemia    Asthma    Cataract    bilateral   CHF (NYHA class II, ACC/AHA stage C) (HCC)    Chronic kidney disease    stage IV (severe), Dr. Aleene Davidson   Constipation    Diabetes mellitus without complication ([place])    Edema    feet/ankles   GERD (gastroesophageal reflux disease)    Hyperlipidemia    Hypertension    Left ankle pain    Lumbago    Myocardial infarction ([place])    Neuropathy    Neuropathy    OA (osteoarthritis)    Obesity    Obstructive sleep apnea syndrome    CPAP   Paresthesia    Foot   Protein calorie malnutrition (HCC)    Requires supplemental oxygen    Stroke ([place])    Vitamin D deficiency    Past Surgical History:  Past Surgical History:  Procedure Laterality Date   ANKLE SURGERY Left    CATARACT EXTRACTION W/PHACO Right 12/20/2015   Procedure: CATARACT EXTRACTION PHACO AND INTRAOCULAR LENS PLACEMENT ([place]);  Surgeon: Eulogio Bear, MD;  Location: [facility] ORS;  Service: Ophthalmology;  Laterality: Right;  [place] 2.50 AP% 21.8 CDE 37.04 Fluid pack lot # [id number]   CATARACT EXTRACTION W/PHACO Left 02/14/2016   Procedure: CATARACT EXTRACTION PHACO AND INTRAOCULAR LENS PLACEMENT ([facility]);  Surgeon: Eulogio Bear, MD;  Location: [facility] ORS;  Service: Ophthalmology;  Laterality: Left;  [place] 3.12 AP% 42.1 CDE 47.12 Fluid Pack lot # [id number]   COLONOSCOPY     FRACTURE SURGERY Left    ankle   HPI:  Per 60 H&P "75 y.o with significant PMH as below who presented to the ED with chief complaints of shortness of breath and altered mental status.     Patient is currently on BiPAP history obtained from patient's son who is currently at  the bedside.  Per patient's son, patient has been feeling well for the past 4 days.  Reports decreased appetite and poor p.o. intake.  Over the last 2 days patient is also noted with increased shortness of breath decreased mental status.  Patient's son stated he tried to feed her but could not hold anything in her mouth and drooling from the right.  Patient's son called EMS today due to worsening symptoms.  On EMS arrival, patient was noted with sats of 78% on therefore was placed on nasal cannula at 2 L with improvement in sats to 95%.  Patient was also covered in vomit and feces"    Assessment / Plan / Recommendation  Clinical Impression  Pt seen for clinical swallowing evaluation. Pt alert, confused, and slow to respond. Dried secretions noted on lingual body and coating teeth. Oral care provided by SLP with removal of a significant amount of dried, white secretions from oral cavity. Pt with sparse dentition in poor condition/hygiene. Inconsistent ability to follow commands for oral motor examination.   Pt given trials of ice chips, thin liquids (via cup and straw), and nectar-thick liquids (via tsp). Pt with s/sx mild-moderate oral dysphagia with oral holding/delayed oral transit with trials of ice, water, and nectar-thick liquids as well as anterior loss of thin liquids  on R. Pt with s/sx highly suspected pharyngeal dysphagia with immediate/ prolonged coughing and changes in SpO2 following trials of water via straw and nectar-thick liquids via tsp. SpO2 dropped as low as upper 80s following straw sip of thin liquids and 80% following tsp of nectar-thick liquids. Further PO trials deferred given severity of pt's clinical s/sx. SpO2 recovered to 91% prior to SLP leaving room. RN made aware of pt's changes to SpO2 during evaluations.  At present, a safe oral diet cannot be recommended. Recommend NPO with oral care by nursing staff and consideration for short term alternate route of  nutrition/hydration/medication.   SLP to f/u for clinical swallowing re-evaluation. Pt made aware of results, recommendations, and POC. ?full understanding by pt. RN also made aware of results, recommendations, and POC.  SLP Visit Diagnosis: Dysphagia, oropharyngeal phase (R13.12)    Aspiration Risk  Moderate aspiration risk    Diet Recommendation Alternative means - temporary        Other  Recommendations Oral Care Recommendations: Oral care QID;Staff/trained caregiver to provide oral care Other Recommendations: Have oral suction available    Recommendations for follow up therapy are one component of a multi-disciplinary discharge planning process, led by the attending physician.  Recommendations may be updated based on patient status, additional functional criteria and insurance authorization.  Follow up Recommendations  (TBD)      Assistance Recommended at Discharge  (TBD)  Functional Status Assessment Patient has had a recent decline in their functional status and demonstrates the ability to make significant improvements in function in a reasonable and predictable amount of time.  Frequency and Duration min 2x/week  2 weeks       Prognosis Prognosis for Safe Diet Advancement: Guarded Barriers to Reach Goals: Cognitive deficits;Severity of deficits      Swallow Study   General Date of Onset: 05/25/21 HPI: Per 96 H&P "75 y.o with significant PMH as below who presented to the ED with chief complaints of shortness of breath and altered mental status.     Patient is currently on BiPAP history obtained from patient's son who is currently at the bedside.  Per patient's son, patient has been feeling well for the past 4 days.  Reports decreased appetite and poor p.o. intake.  Over the last 2 days patient is also noted with increased shortness of breath decreased mental status.  Patient's son stated he tried to feed her but could not hold anything in her mouth and drooling from  the right.  Patient's son called EMS today due to worsening symptoms.  On EMS arrival, patient was noted with sats of 78% on therefore was placed on nasal cannula at 2 L with improvement in sats to 95%.  Patient was also covered in vomit and feces" Type of Study: Bedside Swallow Evaluation Previous Swallow Assessment: unknown Diet Prior to this Study:  (NPO; suspect consumed a regular diet PTA) Respiratory Status: Room air History of Recent Intubation: No Behavior/Cognition: Alert;Confused;Distractible;Requires cueing Oral Cavity Assessment: Dried secretions;Excessive secretions Oral Care Completed by SLP: Yes Oral Cavity - Dentition: Poor condition Self-Feeding Abilities:  (did not attempt to feed self) Patient Positioning: Upright in bed Baseline Vocal Quality: Hoarse Volitional Cough: Cognitively unable to elicit Volitional Swallow: Unable to elicit    Oral/Motor/Sensory Function Overall Oral Motor/Sensory Function:  (unable to assess)   Ice Chips Ice chips: Impaired Oral Phase Impairments: Impaired mastication Oral Phase Functional Implications: Oral holding Pharyngeal Phase Impairments: Suspected delayed Swallow   Thin Liquid Thin Liquid: Impaired  Presentation: Cup;Spoon Oral Phase Impairments: Reduced labial seal;Poor awareness of bolus Oral Phase Functional Implications: Oral holding;Right anterior spillage Pharyngeal  Phase Impairments: Suspected delayed Swallow;Cough - Immediate;Change in Vital Signs Other Comments: SpO2 dropped upper 80s    Nectar Thick Nectar Thick Liquid: Impaired Presentation: Spoon Oral Phase Impairments: Poor awareness of bolus Oral phase functional implications: Oral holding Pharyngeal Phase Impairments: Suspected delayed Swallow;Cough - Immediate;Change in Vital Signs Other Comments: SpO2 dropped to 80; further PO deferred    Cherrie Gauze, M.S., [facility] [phone]  [place] Denis)   Quintella Baton 05/27/2021,12:28 PM

## 2021-05-27 NOTE — Evaluation (Signed)
Physical Therapy Evaluation Patient Details Name: Veronica Anderson MRN: [id number] DOB: Nov 28, 1945 Today's Date: 05/27/2021  History of Present Illness  Veronica Anderson is a 75 y.o. female with past medical history of hypertension, hyperlipidemia, diabetes, CKD, CHF, and stroke with right hemiparesis who presents to the ED complaining of shortness of breath and altered mental status found to be in acute hypoxic respiratory failure due to suspected CAP. Central venous catheter placed on 05/26/21.   Clinical Impression  Pt is a pleasant 75 year old female who presents to PT evaulation after admission for diagnosis listed above. Pt oriented only to self and son provided brief history prior to stepping out of room for patient to receive bed bath. Per pt's son, the patient is mostly bed bound at baseline and they have a hoyer lift for transfer to a recliner chair. Upon evaluation, pt requiring totalA + 2 for rolling and total assistance for pericare. Pt desaturated on 2L of oxygen to 85% during rolling and increased to 4L O2 by RN and SpO2 improved to 100% in supine. Pt demonstrating decreased muscular strength/ROM/activity tolerance/cognition. Pt does not seem far off of her baseline however, further conversations with family is necessary to identify patient needs and any further need for DME equipment. Pt will benefit from further skilled PT services during acute hospitalization to improve bed mobility to reduce caregiver burden.      Recommendations for follow up therapy are one component of a multi-disciplinary discharge planning process, led by the attending physician.  Recommendations may be updated based on patient status, additional functional criteria and insurance authorization.  Follow Up Recommendations No PT follow up    Assistance Recommended at Discharge Frequent or constant Supervision/Assistance  Functional Status Assessment Patient has not had a recent decline in their functional status   Equipment Recommendations  None recommended by PT;Other (comment) (Son reporting they have hoyer lift at home but not present after evaluation to see if other equipment is needed)    Recommendations for Other Services       Precautions / Restrictions Precautions Precautions: Fall Restrictions Weight Bearing Restrictions: No      Mobility  Bed Mobility Overal bed mobility: Needs Assistance Bed Mobility: Rolling Rolling: Total assist;+2 for physical assistance         General bed mobility comments: TotalA + 2 for rolling in bed for bed bathing. + 3 for completing peri-care and bed linen change.    Transfers                   General transfer comment: Deferred due to fatigue with bed mobility    Ambulation/Gait               General Gait Details: Pt non-ambulatory at baseline  Stairs            Wheelchair Mobility    Modified Rankin (Stroke Patients Only)       Balance                                             Pertinent Vitals/Pain Facial Expression: Relaxed, neutral Body Movements: Absence of movements Muscle Tension: Relaxed Compliance with ventilator (intubated pts.): N/A Vocalization (extubated pts.): Talking in normal tone or no sound CPOT Total: 0    Home Living Family/patient expects to be discharged to:: Private residence Living Arrangements: Children Available Help at Discharge: Family;Available  24 hours/day Type of Home: House             Additional Comments: Son reports that patient requires hoyer lift at baseline for transfers at home.    Prior Function Prior Level of Function : Needs assist       Physical Assist : ADLs (physical);Mobility (physical) Mobility (physical): Bed mobility;Transfers ADLs (physical): Grooming;Bathing;Dressing Mobility Comments: Hoyer lift for transfers at home ADLs Comments: Dependent for ADLs per son     Hand Dominance        Extremity/Trunk Assessment    Upper Extremity Assessment Upper Extremity Assessment: Generalized weakness    Lower Extremity Assessment Lower Extremity Assessment: RLE deficits/detail;LLE deficits/detail RLE Deficits / Details: Requiring totalA for movement of LE; Hx of L CVA LLE Deficits / Details: Able to initiate some knee extension in gravity eliminated position during bed linen changes       Communication   Communication: HOH  Cognition Arousal/Alertness: Awake/alert Behavior During Therapy: Flat affect Overall Cognitive Status: History of cognitive impairments - at baseline                                 General Comments: Alert only to self. Unable to answer any other questions.        General Comments      Exercises Other Exercises Other Exercises: Assisted RN and NT for rolling in bed to complete bed bath and linen change. Pt unable to follow simple commands throughout and requiring total assistance for all mobility and pericare.   Assessment/Plan    PT Assessment Patient needs continued PT services  PT Problem List Decreased range of motion;Decreased strength;Decreased activity tolerance;Decreased balance;Decreased mobility;Decreased coordination;Decreased cognition;Decreased knowledge of use of DME;Decreased safety awareness;Decreased knowledge of precautions;Cardiopulmonary status limiting activity;Obesity;Decreased skin integrity       PT Treatment Interventions DME instruction;Gait training;Functional mobility training;Therapeutic activities;Therapeutic exercise;Balance training;Neuromuscular re-education;Cognitive remediation;Patient/family education;Wheelchair mobility training;Manual techniques;Modalities    PT Goals (Current goals can be found in the Care Plan section)  Acute Rehab PT Goals Patient Stated Goal: none stated PT Goal Formulation: With patient Time For Goal Achievement: 06/10/21 Potential to Achieve Goals: Poor    Frequency Min 2X/week   Barriers to  discharge        Co-evaluation               AM-PAC PT "6 Clicks" Mobility  Outcome Measure Help needed turning from your back to your side while in a flat bed without using bedrails?: Total Help needed moving from lying on your back to sitting on the side of a flat bed without using bedrails?: Total Help needed moving to and from a bed to a chair (including a wheelchair)?: Total Help needed standing up from a chair using your arms (e.g., wheelchair or bedside chair)?: Total Help needed to walk in hospital room?: Total Help needed climbing 3-5 steps with a railing? : Total 6 Click Score: 6    End of Session Equipment Utilized During Treatment: Oxygen Activity Tolerance: Patient limited by fatigue Patient left: in bed;with call bell/phone within reach;with bed alarm set;Other (comment) (Heels elevated; Quarter turn towards her R side) Nurse Communication: Mobility status;Need for lift equipment PT Visit Diagnosis: Unsteadiness on feet (R26.81);Muscle weakness (generalized) (M62.81);Other abnormalities of gait and mobility (R26.89)    Time: 2585-2778 PT Time Calculation (min) (ACUTE ONLY): 23 min   Charges:   PT Evaluation $PT Eval Moderate Complexity: 1 Mod PT  Treatments $Therapeutic Activity: 8-22 mins        Andrey Campanile, SPT   Andrey Campanile 05/27/2021, 4:39 PM

## 2021-05-27 NOTE — Progress Notes (Signed)
Patient alert to self and place. Patient in NSR with some tachycardia at end of shift. MD made aware. Patient on Room air, with 2L via [place] intermittently , has some drop in sats when coughing but recovers quickly. Patient on bipap at night. Foam dressings changed. Moisture barrier applied. Patient has a foley, pericare complete with 450 output and 2 bowel movements.

## 2021-05-27 NOTE — Progress Notes (Addendum)
Milagros Evener, NP notified of Hgb at 7.0, NP in to get consent for blood transfusion from patients son who is at the bedside. See new orders.

## 2021-05-27 NOTE — Progress Notes (Signed)
Initial Nutrition Assessment  DOCUMENTATION CODES:   Non-severe (moderate) malnutrition in context of chronic illness  INTERVENTION:   RD will monitor for diet advancement vs the need for nutrition support.   If family wishes to pursue full aggressive care, would recommend consideration of PEG tube placement.   Pt at high refeed risk; recommend monitor potassium, magnesium and phosphorus labs daily until stable  If feeding tube placed, recommend:  Osmolite 1.5@60ml /hr- Initiate at 61ml/hr and increase by 32ml/hr q 8 hours until goal rate is reached.   Free water flushes 143ml q4 hours   Regimen provides 2160kcal/day, 90g/day protein and 16107ml/day of free water   Juven Fruit Punch BID via tube, each serving provides 95kcal and 2.5g of protein (amino acids glutamine and arginine)  NUTRITION DIAGNOSIS:   Moderate Malnutrition related to chronic illness (CVA, CKD, DM, CHF) as evidenced by mild fat depletion, moderate fat depletion, moderate muscle depletion, severe muscle depletion.  GOAL:   Patient will meet greater than or equal to 90% of their needs  MONITOR:   Diet advancement, Labs, Weight trends, Skin, I & O's  REASON FOR ASSESSMENT:   Consult Assessment of nutrition requirement/status  ASSESSMENT:   75 y/o female with h/o CVA, HTN, CHF, DM, HLD, GERD, CKD III and OSA who is admitted with aspiration PNA.  Visited pt's room today. Pt unable to provide any nutrition related history. Family reports pt with poor oral intake pta; family also reports that patient has been holding food in her mouth. Pt also with nausea and vomiting pta. Pt seen by SLP today and was unable to be placed on a diet. Per chart, pt is down 100lbs since 2020; RD unsure how recently weight loss occurred as pt does not have any recent documented weights in chart. Pt's UBW appears to be ~290lbs. Palliative care consult is pending. If family wishes to pursue full aggressive care, would recommend  consideration of PEG tube placement as pt's poor oral intake appears chronic. Pt is at high refeed risk. RD will monitor for GOC.   Medications reviewed and include: lovenox, insulin, thiamine, levophed, zosyn, KCl, KPhos   Labs reviewed: Na 146(H), K 2.9(L), P 1.7(L), Mg 1.9 wnl Wbc- 22.9(H), Hgb 7.0(L), Hct 22.6(L) Cbgs- 104, 86 x 24 hrs AIC 6.6(H)- 11/12  NUTRITION - FOCUSED PHYSICAL EXAM:  Flowsheet Row Most Recent Value  Orbital Region No depletion  Upper Arm Region Moderate depletion  Thoracic and Lumbar Region Mild depletion  Buccal Region No depletion  Temple Region No depletion  Clavicle Bone Region No depletion  Clavicle and Acromion Bone Region No depletion  Scapular Bone Region Moderate depletion  Dorsal Hand Moderate depletion  Patellar Region Moderate depletion  Anterior Thigh Region Moderate depletion  Posterior Calf Region Severe depletion  Edema (RD Assessment) None  Hair Reviewed  Eyes Reviewed  Mouth Reviewed  Skin Reviewed  Nails Reviewed   Diet Order:   Diet Order             Diet NPO time specified  Diet effective now                  EDUCATION NEEDS:   No education needs have been identified at this time  Skin:  Skin Assessment: Reviewed RN Assessment (Stage II sacrum)  Last BM:  11/13- type 7  Height:   Ht Readings from Last 1 Encounters:  05/25/21 5\' 4"  (1.626 m)    Weight:   Wt Readings from Last 1 Encounters:  05/27/21  86.2 kg    Ideal Body Weight:  54.5 kg  BMI:  Body mass index is 32.62 kg/m.  Estimated Nutritional Needs:   Kcal:  1800-2100kcal/day  Protein:  90-105g/day  Fluid:  1.5-1.7L/day  Koleen Distance MS, RD, LDN Please refer to [facility] for RD and/or RD on-call/weekend/after hours pager

## 2021-05-28 ENCOUNTER — Inpatient Hospital Stay: Payer: Medicare Other

## 2021-05-28 ENCOUNTER — Encounter: Payer: Self-pay | Admitting: Internal Medicine

## 2021-05-28 ENCOUNTER — Other Ambulatory Visit: Payer: Self-pay

## 2021-05-28 DIAGNOSIS — J9601 Acute respiratory failure with hypoxia: Secondary | ICD-10-CM | POA: Diagnosis not present

## 2021-05-28 DIAGNOSIS — R6521 Severe sepsis with septic shock: Secondary | ICD-10-CM

## 2021-05-28 DIAGNOSIS — A419 Sepsis, unspecified organism: Secondary | ICD-10-CM | POA: Diagnosis not present

## 2021-05-28 DIAGNOSIS — E440 Moderate protein-calorie malnutrition: Secondary | ICD-10-CM | POA: Diagnosis not present

## 2021-05-28 DIAGNOSIS — J690 Pneumonitis due to inhalation of food and vomit: Secondary | ICD-10-CM | POA: Diagnosis not present

## 2021-05-28 LAB — BASIC METABOLIC PANEL
Anion gap: 9 (ref 5–15)
BUN: 14 mg/dL (ref 8–23)
CO2: 33 mmol/L — ABNORMAL HIGH (ref 22–32)
Calcium: 7.3 mg/dL — ABNORMAL LOW (ref 8.9–10.3)
Chloride: 101 mmol/L (ref 98–111)
Creatinine, Ser: 0.85 mg/dL (ref 0.44–1.00)
GFR, Estimated: >60 mL/min (ref >60)
Glucose, Bld: 92 mg/dL (ref 70–99)
Potassium: 4.5 mmol/L (ref 3.5–5.1)
Sodium: 143 mmol/L (ref 135–145)

## 2021-05-28 LAB — GLUCOSE, CAPILLARY
Glucose-Capillary: 83 mg/dL (ref 70–99)
Glucose-Capillary: 88 mg/dL (ref 70–99)
Glucose-Capillary: 94 mg/dL (ref 70–99)
Glucose-Capillary: 116 mg/dL — ABNORMAL HIGH (ref 70–99)
Glucose-Capillary: 112 mg/dL — ABNORMAL HIGH (ref 70–99)

## 2021-05-28 LAB — TYPE AND SCREEN
ABO/RH(D): B POS
Antibody Screen: NEGATIVE
Unit division: 0

## 2021-05-28 LAB — CBC
HCT: 24.8 % — ABNORMAL LOW (ref 36.0–46.0)
Hemoglobin: 8 g/dL — ABNORMAL LOW (ref 12.0–15.0)
MCH: 25.9 pg — ABNORMAL LOW (ref 26.0–34.0)
MCHC: 32.3 g/dL (ref 30.0–36.0)
MCV: 80.3 fL (ref 80.0–100.0)
Platelets: 138 10*3/uL — ABNORMAL LOW (ref 150–400)
RBC: 3.09 MIL/uL — ABNORMAL LOW (ref 3.87–5.11)
RDW: 20 % — ABNORMAL HIGH (ref 11.5–15.5)
WBC: 17.4 10*3/uL — ABNORMAL HIGH (ref 4.0–10.5)
nRBC: 0 % (ref 0.0–0.2)

## 2021-05-28 LAB — BPAM RBC
Blood Product Expiration Date: 202211212359
ISSUE DATE / TIME: 202211140833
Unit Type and Rh: 7300

## 2021-05-28 LAB — MAGNESIUM: Magnesium: 2.3 mg/dL (ref 1.7–2.4)

## 2021-05-28 LAB — PHOSPHORUS: Phosphorus: 2.1 mg/dL — ABNORMAL LOW (ref 2.5–4.6)

## 2021-05-28 MED ORDER — ACETAMINOPHEN 325 MG PO TABS
650.0000 mg | ORAL_TABLET | Freq: Four times a day (QID) | ORAL | Status: DC | PRN
  Administered 2021-05-28 – 2021-05-30 (×5): 650 mg via ORAL
  Filled 2021-05-28 (×5): qty 2

## 2021-05-28 MED ORDER — NEPRO/CARBSTEADY PO LIQD
237.0000 mL | Freq: Three times a day (TID) | ORAL | Status: DC
  Administered 2021-05-28 – 2021-05-30 (×4): 237 mL via ORAL

## 2021-05-28 MED ORDER — ADULT MULTIVITAMIN W/MINERALS CH
1.0000 | ORAL_TABLET | Freq: Every day | ORAL | Status: DC
  Administered 2021-05-29 – 2021-05-30 (×2): 1 via ORAL
  Filled 2021-05-28 (×2): qty 1

## 2021-05-28 MED ORDER — SODIUM PHOSPHATES 45 MMOLE/15ML IV SOLN
20.0000 mmol | Freq: Once | INTRAVENOUS | Status: AC
  Administered 2021-05-28: 20 mmol via INTRAVENOUS
  Filled 2021-05-28: qty 6.67

## 2021-05-28 NOTE — Consult Note (Signed)
PHARMACY CONSULT NOTE  Pharmacy Consult for Electrolyte Monitoring and Replacement   Recent Labs: Potassium (mmol/L)  Date Value  05/28/2021 4.5  04/11/2014 3.7   Magnesium (mg/dL)  Date Value  05/28/2021 2.3  04/11/2014 2.0   Calcium (mg/dL)  Date Value  05/28/2021 7.3 (L)   Calcium, Total (mg/dL)  Date Value  04/11/2014 8.1 (L)   Albumin (g/dL)  Date Value  05/27/2021 2.7 (L)  11/14/2015 3.7  04/08/2014 2.7 (L)   Phosphorus (mg/dL)  Date Value  05/28/2021 2.1 (L)   Sodium (mmol/L)  Date Value  05/28/2021 143  11/14/2015 145 (H)  04/11/2014 143   Assessment: Patient is a 75 y/o F with medical history including CKD, CHF, asthma, HTN, diabetes, GERD, remote hx CVA, MI who is admitted with acute hypoxic respiratory failure secondary to aspiration pneumonia. Pharmacy consulted to assist with electrolyte monitoring and replacement as indicated.  Nutrition: NPO MIVF: None  Goal of Therapy:  Electrolytes within normal limits  Plan:  --Hypokalemia resolved with replacement --Phosphorous 2.1, IV sodium phosphate 20 mmol x 1 --Follow-up electrolytes with AM labs tomorrow  Benita Gutter 05/28/2021 7:57 AM

## 2021-05-28 NOTE — TOC Initial Note (Signed)
Transition of Care [facility]) - Initial/Assessment Note    Patient Details  Name: Veronica Anderson MRN: [id number] Date of Birth: Sep 26, 1945  Transition of Care [facility]) CM/SW Contact:    Adelene Amas, [place] Phone Number: 05/28/2021, 4:25 PM  Clinical Narrative:                  Patient presets to [facility] due to SOB.  Patient needs assistance with most ADLS.  Main contact and care giver Willard, Madrigal (Son) [phone] [facility]).  Patient followed by [place] [facility]PS. PT/OT have evaluated patient and there are not PT/OT needs. Central Venous catheter placed on 05/26/2021. Palliative car attempted to speak with patient and son Mr. [name] about goals of care.  Patient was fatigued and w/ impaired cognition and son went home.  Palliative Care will try to contact patient's son at another time.  CSW updated care team, patient was referred to hospice by [facility], and both patient and son refused hospice care.  Expected Discharge Plan: Home/Self Care Barriers to Discharge: Continued Medical Work up   Patient Goals and CMS Choice        Expected Discharge Plan and Services Expected Discharge Plan: Home/Self Care In-house Referral: Clinical Social Work   Post Acute Care Choice: [place] Living arrangements for the past 2 months: [place]                                      Prior Living Arrangements/Services Living arrangements for the past 2 months: Single Family Home Lives with:: Adult Children Keigan, Girten (Son)   [phone] (Mobile)) Patient language and need for interpreter reviewed:: Yes Do you feel safe going back to the place where you live?: Yes      Need for Family Participation in Patient Care: Yes (Comment) Care giver support system in place?: Yes (comment)   Criminal Activity/Legal Involvement Pertinent to Current Situation/Hospitalization: No - Comment as needed  Activities of Daily Living Home Assistive  Devices/Equipment: CPAP, Oxygen ADL Screening (condition at time of admission) Patient's cognitive ability adequate to safely complete daily activities?: No Is the patient deaf or have difficulty hearing?: No Does the patient have difficulty seeing, even when wearing glasses/contacts?: No Does the patient have difficulty concentrating, remembering, or making decisions?: No Patient able to express need for assistance with ADLs?: Yes Does the patient have difficulty dressing or bathing?: Yes Independently performs ADLs?: No Does the patient have difficulty walking or climbing stairs?: Yes Weakness of Legs: Both Weakness of Arms/Hands: Both  Permission Sought/Granted Permission sought to share information with : Facility Sport and exercise psychologist    Share Information with NAME: Kyndell, Zeiser (Son)   [phone] (Mobile)           Emotional Assessment Appearance:: Appears older than stated age Attitude/Demeanor/Rapport: Unable to Assess Affect (typically observed): Stable Orientation: : Oriented to Self, Oriented to Place, Oriented to  Time, Oriented to Situation Alcohol / Substance Use: Not Applicable Psych Involvement: No (comment)  Admission diagnosis:  Gallstones [K80.20] Acute respiratory failure with hypoxia (HCC) [J96.01] Aspiration pneumonia of right lower lobe, unspecified aspiration pneumonia type [facility]) [J69.0] Patient Active Problem List   Diagnosis Date Noted   Malnutrition of moderate degree 05/27/2021   Pressure ulcer, sacrum 05/26/2021    Class: Stage 2   Aspiration pneumonia of right lower lobe (HCC)    Acute respiratory failure with  hypoxia ([place]) 05/25/2021   Acute respiratory failure with hypoxemia ([place]) 06/28/2018   Post-menopausal bleeding 11/18/2017   UTI (urinary tract infection) 09/11/2017   Pressure injury of skin 09/11/2017   Chronic kidney disease (CKD), stage IV (severe) (HCC) 07/29/2017   Mild protein-calorie malnutrition ([place]) 07/29/2017   Anemia  due to chronic kidney disease    Prolonged Q-T interval on ECG 12/23/2016   CHF (congestive heart failure), NYHA class I ([place]) 11/07/2016   Nocturnal oxygen desaturation 06/26/2015   Anemia in chronic illness 02/23/2015   Asthma, mild intermittent 02/23/2015   Benign essential HTN 46/27/0350   Diastolic heart failure, NYHA class 3 ([place]) 02/23/2015   Benign hypertension with chronic kidney disease, stage III ([place]) 02/23/2015   CN (constipation) 02/23/2015   Diabetes mellitus with neuropathy ([place]) 02/23/2015   Dyslipidemia 02/23/2015   Elevated ferritin 02/23/2015   Gastro-esophageal reflux disease without esophagitis 02/23/2015   LBP (low back pain) 02/23/2015   Morbid obesity due to excess calories ([place]) 02/23/2015   Obstructive apnea 02/23/2015   Osteoarthritis 02/23/2015   Neuropathy 02/23/2015   Perennial allergic rhinitis with seasonal variation 02/23/2015   History of pneumonia 02/23/2015   Vitamin D deficiency 02/23/2015   Cataract 04/16/2010   Background diabetic retinopathy ([place]) 12/18/2009   PCP:  Tracie Harrier, MD Pharmacy:   [id number] - [place], [place] - [address] [place] [address] Phone: [phone] Fax: [phone]     Social Determinants of Health (SDOH) Interventions    Readmission Risk Interventions No flowsheet data found.

## 2021-05-28 NOTE — Progress Notes (Addendum)
Speech Language Pathology Treatment: Dysphagia  Patient Details Name: [place] Kirchner MRN: [id number] DOB: 1946/02/06 Today's Date: 05/28/2021 Time: 1415-1510 SLP Time Calculation (min) (ACUTE ONLY): 55 min  Assessment / Plan / Recommendation Clinical Impression  Pt seen for dysphagia therapy session w/ po trials in hopes to initiate an oral diet. This was a limited assessment d/t pt's reduced participation in taking/accepting po's during the the tx session despite encouragement from SLP. Pt appeared fatigued throughout the entire session; she required MOD+ cues and support w/ feeding w/ the po tasks. She often tended to lean to her R side and exhibited minimal verbal engagement. Oral care given prior to po's -- there is concern for gum/Dentition infection per NSG(on antibiotic); pt reported "soreness" and discomfort.  Pt appears at increased risk for aspiration in current presentation/status d/t declined awareness for task of oral intake, and lack of desire to take po's in general. She appears weak and frail; there is concern for delayed timing of pharyngeal swallow thus airway protection. Pt tends to lean to her R side w/ head tilt to R. She required Mod+ positioning support to achieve Midline positioning. She made no to self-feed and required total assist. Pt accepted few po trials w/ SLP requiring boluses placed at lips VIA TSP. Pt given trials of single ice chips, Nectar liquids, and purees. No immediate, overt clinical s/s of aspiration noted; no cough or decline in respiratory status - pt's breathing remained calm and unlabored and O2 sats remained mid90s. Vocal quality was clear when she phonated; reduced volume/intensity present. Pt exhibited mild-moderate delay during the oral phase (oral holding/delayed oral transit w/ trials); also suspect risk for delay in pharyngeal swallow initiation. Oral phase Time was increased for bolus management and A-P transfer/swallow and oral clearing. Pt often mumbled  "it's all gone" when she was still orally manipulating the bolus and/or residue w/ collection and spillage to the R side.  Pt stated she did not want anything more to eat/drink after ~14-15 TSP trials total.  Recommend initiation of a Dysphagia diet of Puree and NECTAR liquids VIA TSP w/ strict aspiration precautions and full Supervision and feeding support. Must check for oral clearing b/t boluses. Recommend pills Crushed in puree for safer swallowing. Recommend frequent oral care daily.   Recommend full discussion b/t family, MD, and Palliative Care for [place] re: QOL as pt is at increased risk for aspiration and at risk for inability to adequately meet nutrition/hydration needs safely. Discussed concerns w/ Dietician and Palliative Care including possible need for discussion of alternative means of feeding.  ST services will continue to monitor pt's status and toleration of Dysphagia diet -- can be available for further education and possible upgrade of diet if pt's overall status improves during this admission.       HPI HPI: Per 54 H&P "75 y.o with significant PMH as below who presented to the ED with chief complaints of shortness of breath and altered mental status.  Patient is currently on BiPAP history obtained from patient's son who is currently at the bedside.  Per patient's son, patient has not been feeling well for the past 4 days.  Reports decreased appetite and poor p.o. intake.  Over the last 2 days patient is also noted with increased shortness of breath decreased mental status.  Patient's son stated he tried to feed her but could not hold anything in her mouth and drooling from the right.  Patient's son called EMS today due to worsening symptoms.  On EMS  arrival, patient was noted with sats of 78% on therefore was placed on nasal cannula at 2 L with improvement in sats to 95%.  Patient was also covered in vomit and feces".  Per further report, pt has not been taking po's well for  some time; there is report of a 100lb weight loss per the Dietician.  Pt is totally dependent for ADLs at home including much feeding/drinking.      SLP Plan  Continue with current plan of care (monitor status)      Recommendations for follow up therapy are one component of a multi-disciplinary discharge planning process, led by the attending physician.  Recommendations may be updated based on patient status, additional functional criteria and insurance authorization.    Recommendations  Diet recommendations: Dysphagia 1 (puree);Nectar-thick liquid Liquids provided via: Teaspoon Medication Administration: Crushed with puree (for safer swallowing) Supervision: Staff to assist with self feeding;Full supervision/cueing for compensatory strategies Compensations: Minimize environmental distractions;Slow rate;Small sips/bites;Lingual sweep for clearance of pocketing;Multiple dry swallows after each bite/sip;Follow solids with liquid Postural Changes and/or Swallow Maneuvers: Seated upright 90 degrees;Upright 30-60 min after meal                General recommendations:  (Palliative Care consult for [place]; Dietician f/u) Oral Care Recommendations: Oral care BID;Oral care QID;Oral care before and after PO;Staff/trained caregiver to provide oral care Follow Up Recommendations: Skilled nursing-short term rehab (<3 hours/day) Assistance recommended at discharge: Frequent or constant Supervision/Assistance SLP Visit Diagnosis: Dysphagia, oropharyngeal phase (R13.12) (cognitive decline) Plan: Continue with current plan of care (monitor status)       GO                  Orinda Kenner, MS, CCC-SLP Speech Language Pathologist Rehab Services [phone] [facility]  05/28/2021, 5:53 PM

## 2021-05-28 NOTE — Progress Notes (Signed)
Palliative:  Chart reviewed. Visited with patient during SLP eval. Patient unable to participate in [place] conversation independently r/t impaired cognition and fatigue.   Per chart review, son is next of kin and caregiver. Son not at bedside during my assessment - per nurse, had went home to shower and eat. Attempted to call him to schedule meeting but no answer.  Will make attempts again 11/16 to discuss [place] with son.  Juel Burrow, DNP, AGNP-C Palliative Medicine Team Team Phone # [phone]  Pager # [phone]  NO CHARGE

## 2021-05-28 NOTE — Plan of Care (Signed)

## 2021-05-28 NOTE — Evaluation (Signed)
Occupational Therapy Evaluation Patient Details Name: Veronica Anderson MRN: [id number] DOB: 22-Mar-1946 Today's Date: 05/28/2021   History of Present Illness Veronica Anderson is a 75 y.o. female with past medical history of hypertension, hyperlipidemia, diabetes, CKD, CHF, and stroke with right hemiparesis who presents to the ED complaining of shortness of breath and altered mental status found to be in acute hypoxic respiratory failure due to suspected CAP. Central venous catheter placed on 05/26/21.   Clinical Impression   Veronica Anderson was seen for OT evaluation this date. Prior to hospital admission, pt was dependent for bathing/dressing/toileting and transfers using hoyer lift. Pt reports self-feeding with setup. Pt lives with son who provides all care. Pt presents to acute OT demonstrating impaired ADL performance 2/2 functional UE strength deficits and decreased insight into deficits. Pt currently requires  MIN A simulated self-feeding at bed level. TOTAL A for LBD bed level. TOTAL A x2 rolling at bed level. Pt would benefit from skilled OT to instruct pt/family on bed level HEP for edema mgmt and return to independence with self-feeding. Upon hospital discharge, recommend no OT follow up as pt is near functional baseline.      Recommendations for follow up therapy are one component of a multi-disciplinary discharge planning process, led by the attending physician.  Recommendations may be updated based on patient status, additional functional criteria and insurance authorization.   Follow Up Recommendations  No OT follow up    Assistance Recommended at Discharge Frequent or constant Supervision/Assistance  Functional Status Assessment  Patient has not had a recent decline in their functional status  Equipment Recommendations  None recommended by OT    Recommendations for Other Services       Precautions / Restrictions Precautions Precautions: Fall Restrictions Weight Bearing Restrictions: No       Mobility Bed Mobility Overal bed mobility: Needs Assistance Bed Mobility: Rolling Rolling: Total assist;+2 for physical assistance              Transfers                   General transfer comment: Deferred due to baseline hoyer      Balance                                           ADL either performed or assessed with clinical judgement   ADL Overall ADL's : Needs assistance/impaired                                       General ADL Comments: MIN A simulated self-feeding at bed level. TOTAL A for LBD bed level. TOTAL A x2 rolling at bed level for periaccess.      Pertinent Vitals/Pain Pain Assessment: Faces Faces Pain Scale: Hurts a little bit Pain Location: neck with positional turns Pain Descriptors / Indicators: Discomfort;Grimacing Pain Intervention(s): Limited activity within patient's tolerance;Repositioned     Hand Dominance Right   Extremity/Trunk Assessment Upper Extremity Assessment Upper Extremity Assessment: RUE deficits/detail;LUE deficits/detail RUE Deficits / Details: Grip 3/5. Biceps 3+/5. Deltoid 2/5 LUE Deficits / Details: Grip 3+/5. Biceps 3+/5. Deltoid 3-/5   Lower Extremity Assessment Lower Extremity Assessment: Defer to PT evaluation RLE Deficits / Details: Requiring totalA for movement of LE; Hx of R CVA LLE Deficits / Details: Able  to initiate some knee extension in gravity eliminated position during bed linen changes       Communication Communication Communication: HOH   Cognition Arousal/Alertness: Awake/alert Behavior During Therapy: Flat affect Overall Cognitive Status: History of cognitive impairments - at baseline                                 General Comments: States PLOF accurately (hoyer for OOB and self-feeds).     General Comments       Exercises Exercises: Other exercises Other Exercises Other Exercises: Pt educated re: HEP - importance of fist  pumps to improve edema, OT role, DME recs, falls prevention Other Exercises: rolling, simulated self-feeding   Shoulder Instructions      Home Living Family/patient expects to be discharged to:: Private residence Living Arrangements: Children Available Help at Discharge: Family;Available 24 hours/day Type of Home: House                       Home Equipment: Other (comment) (hoyer lift)          Prior Functioning/Environment Prior Level of Function : Needs assist       Physical Assist : ADLs (physical);Mobility (physical) Mobility (physical): Bed mobility;Transfers ADLs (physical): Grooming;Bathing;Dressing Mobility Comments: Hoyer lift for transfers at home ADLs Comments: Dependent for ADLs per son        OT Problem List: Decreased strength;Decreased range of motion;Decreased activity tolerance      OT Treatment/Interventions:      OT Goals(Current goals can be found in the care plan section) Acute Rehab OT Goals Patient Stated Goal: to feel better and go home OT Goal Formulation: With patient Time For Goal Achievement: 06/11/21 Potential to Achieve Goals: Good ADL Goals Pt Will Perform Eating: with set-up;with supervision;bed level;with caregiver independent in assisting Pt Will Transfer to Toilet: with max assist;with +2 assist (rolling at bed level) Pt/caregiver will Perform Home Exercise Program: Increased ROM;Increased strength;Both right and left upper extremity  OT Frequency:     Barriers to D/C:            Co-evaluation              AM-PAC OT "6 Clicks" Daily Activity     Outcome Measure Help from another person eating meals?: A Little Help from another person taking care of personal grooming?: A Lot Help from another person toileting, which includes using toliet, bedpan, or urinal?: Total Help from another person bathing (including washing, rinsing, drying)?: Total Help from another person to put on and taking off regular upper body  clothing?: A Lot Help from another person to put on and taking off regular lower body clothing?: Total 6 Click Score: 10   End of Session    Activity Tolerance: Patient tolerated treatment well;Patient limited by fatigue Patient left: in bed;with call bell/phone within reach;with nursing/sitter in room  OT Visit Diagnosis: Muscle weakness (generalized) (M62.81)                Time: 7482-7078 OT Time Calculation (min): 8 min Charges:  OT General Charges $OT Visit: 1 Visit OT Evaluation $OT Eval Low Complexity: 1 Low  Dessie Coma, M.S. OTR/L  05/28/21, 9:21 AM  ascom [phone]

## 2021-05-28 NOTE — Progress Notes (Addendum)
Progress Note    Veronica Anderson  ZLD:[id number] DOB: 01-12-1946  DOA: 05/25/2021 PCP: Tracie Harrier, MD      Brief Narrative:    Medical records reviewed and are as summarized below:  Veronica Anderson is a 75 y.o. female with multiple medical problems including but not limited to stroke, CAD with previous MI, CAD, vitamin D deficiency, osteoarthritis, allergic rhinitis, hypertension UTI, CKD stage IV, anemia of chronic kidney disease, chronic diastolic CHF, chronic hypoxemic respiratory failure, type II DM with peripheral neuropathy, OSA.  She was brought to the hospital because of altered mental status and shortness of breath.  She was found to have septic shock secondary to community-acquired pneumonia, possibly aspiration pneumonia, acute hypoxemic respiratory failure and acute metabolic encephalopathy.  She was treated with empiric IV antibiotics, IV fluids and vasopressors.  Her condition improved and she was transferred to [facility] hospitalist service on 05/28/2021.      Assessment/Plan:   Active Problems:   Acute respiratory failure with hypoxia (HCC)   Aspiration pneumonia of right lower lobe (HCC)   Pressure ulcer, sacrum   Malnutrition of moderate degree   Severe sepsis with septic shock [facility])   Nutrition Problem: Moderate Malnutrition Etiology: chronic illness (CVA, CKD, DM, CHF)  Signs/Symptoms: mild fat depletion, moderate fat depletion, moderate muscle depletion, severe muscle depletion   Body mass index is 32.62 kg/m.  (Obesity)  Septic shock community-acquired pneumonia, possibly aspiration pneumonia: Has resolved and she is off of vasopressors.  Continue empiric IV antibiotics.  Acute on chronic hypoxemic respiratory failure, OSA on CPAP: She said she uses 2 L/min oxygen at home.  Continue 2 L/min oxygen via nasal cannula.  Continue CPAP at night  Acute metabolic encephalopathy: Improving.  Continue supportive care  Hypokalemia, hypernatremia and  hypophosphatemia: Improved  Type II DM: NovoLog as needed for hyperglycemia  Anemia of chronic kidney disease stage IIIb: H&H stable.  S/p transfusion with 1 unit of PRBCs on 05/27/2021.  Monitor H&H and transfuse if hemoglobin drops below 7  Stage II sacral decubitus ulcers (present on admission): Continue local wound care  Biliary sludge, gallstones, uterine fibroids, colonic diverticulosis: Outpatient follow-up with PCP  Follow-up with palliative care team  Diet Order             DIET - DYS 1 Room service appropriate? Yes; Fluid consistency: Nectar Thick  Diet effective now                      Consultants: Intensivist  Procedures: Left femoral nontunneled central venous catheter from 05/26/2021    Medications:    budesonide (PULMICORT) nebulizer solution  0.25 mg Nebulization BID   chlorhexidine  15 mL Mouth Rinse BID   Chlorhexidine Gluconate Cloth  6 each Topical Daily   enoxaparin (LOVENOX) injection  40 mg Subcutaneous Q24H   feeding supplement (NEPRO CARB STEADY)  237 mL Oral TID BM   insulin aspart  0-9 Units Subcutaneous Q4H   mouth rinse  15 mL Mouth Rinse q12n4p   medroxyPROGESTERone  20 mg Oral Daily   [START ON 05/29/2021] multivitamin with minerals  1 tablet Oral Daily   mupirocin ointment  1 application Nasal BID   thiamine injection  100 mg Intravenous Q24H   Continuous Infusions:  sodium chloride Stopped (05/27/21 1435)   norepinephrine (LEVOPHED) Adult infusion Stopped (05/27/21 0538)   piperacillin-tazobactam (ZOSYN)  IV 12.5 mL/hr at 05/28/21 1600     Anti-infectives (From admission, onward)  Start     Dose/Rate Route Frequency Ordered Stop   05/25/21 2200  piperacillin-tazobactam (ZOSYN) IVPB 3.375 g        3.375 g 12.5 mL/hr over 240 Minutes Intravenous Every 8 hours 05/25/21 1854     05/25/21 2000  Ampicillin-Sulbactam (UNASYN) 3 g in sodium chloride 0.9 % 100 mL IVPB  Status:  Discontinued        3 g 200 mL/hr over 30 Minutes  Intravenous Every 6 hours 05/25/21 1546 05/25/21 1759   05/25/21 1430  Ampicillin-Sulbactam (UNASYN) 3 g in sodium chloride 0.9 % 100 mL IVPB        3 g 200 mL/hr over 30 Minutes Intravenous  Once 05/25/21 1427 05/25/21 1538              Family Communication/Anticipated D/C date and plan/Code Status   DVT prophylaxis: enoxaparin (LOVENOX) injection 40 mg Start: 05/26/21 1000 SCDs Start: 05/25/21 1532     Code Status: Full Code  Family Communication: None Disposition Plan: Plan to discharge home in 2 to 3 days   Status is: Inpatient  Remains inpatient appropriate because: IV antibiotics           Subjective:   Interval events noted.  No shortness of breath, cough or chest pain.  Herschel Senegal, Therapist, sports, was at the bedside.  Objective:    Vitals:   05/28/21 0900 05/28/21 1000 05/28/21 1200 05/28/21 1359  BP: 114/66 105/81 130/86   Pulse: 77 81 75   Resp:      Temp:    98.5 F (36.9 C)  TempSrc:    Axillary  SpO2: 100% 100% 100%   Weight:      Height:       No data found.   Intake/Output Summary (Last 24 hours) at 05/28/2021 1636 Last data filed at 05/28/2021 1600 Gross per 24 hour  Intake 1063.41 ml  Output 1020 ml  Net 43.41 ml   Filed Weights   05/25/21 1310 05/26/21 0500 05/27/21 0500  Weight: 72.3 kg 80.8 kg 86.2 kg    Exam:  GEN: NAD SKIN: Stage II sacral decubitus ulcers EYES: No pallor or icterus ENT: MMM CV: RRR PULM: CTA B ABD: soft, obese, NT, +BS CNS: AAO x 2 (person and place), non focal but weak in all 4 extremities EXT: No edema or tenderness    Data Reviewed:   I have personally reviewed following labs and imaging studies:  Labs: Labs show the following:   Basic Metabolic Panel: Recent Labs  Lab 05/25/21 1310 05/25/21 2034 05/26/21 0302 05/26/21 0500 05/26/21 0952 05/27/21 0409 05/27/21 2135 05/28/21 0535  NA 143 146* 147*  --   --  146*  --  143  K 3.6 3.1* 3.3*  --    < > 2.9* 2.9* 4.5  CL 107 112* 113*  --    --  100  --  101  CO2 28 19* 18*  --   --  32  --  33*  GLUCOSE 215* 224* 140*  --   --  100*  --  92  BUN 18 20 16   --   --  14  --  14  CREATININE 0.83 1.30* 1.15*  --   --  0.95  --  0.85  CALCIUM 8.4* 8.2* 7.8*  --   --  7.5*  --  7.3*  MG  --  2.1  --  1.7  --  1.9 1.7 2.3  PHOS  --  4.7*  --  3.0  --  1.7* 2.4* 2.1*   < > = values in this interval not displayed.   GFR Estimated Creatinine Clearance: 60.8 mL/min (by C-G formula based on SCr of 0.85 mg/dL). Liver Function Tests: Recent Labs  Lab 05/26/21 0302 05/27/21 0409  AST 50* 19  ALT 12 10  ALKPHOS 49 46  BILITOT 1.0 1.8*  PROT 5.8* 5.5*  ALBUMIN 2.2* 2.7*   No results for input(s): LIPASE, AMYLASE in the last 168 hours. No results for input(s): AMMONIA in the last 168 hours. Coagulation profile Recent Labs  Lab 05/25/21 1534  INR 1.1    CBC: Recent Labs  Lab 05/25/21 1310 05/26/21 0500 05/27/21 0409 05/28/21 0535  WBC 7.7 15.2* 22.9* 17.4*  NEUTROABS 5.8  --   --   --   HGB 10.2* 9.1* 7.0* 8.0*  HCT 32.7* 29.7* 22.6* 24.8*  MCV 80.9 80.3 80.1 80.3  PLT 307 284 172 138*   Cardiac Enzymes: Recent Labs  Lab 05/26/21 0255  CKTOTAL 153   BNP (last 3 results) No results for input(s): PROBNP in the last 8760 hours. CBG: Recent Labs  Lab 05/27/21 2336 05/28/21 0337 05/28/21 0736 05/28/21 1136 05/28/21 1557  GLUCAP 91 83 88 94 116*   D-Dimer: No results for input(s): DDIMER in the last 72 hours. Hgb A1c: No results for input(s): HGBA1C in the last 72 hours. Lipid Profile: No results for input(s): CHOL, HDL, LDLCALC, TRIG, CHOLHDL, LDLDIRECT in the last 72 hours. Thyroid function studies: No results for input(s): TSH, T4TOTAL, T3FREE, THYROIDAB in the last 72 hours.  Invalid input(s): FREET3 Anemia work up: No results for input(s): VITAMINB12, FOLATE, FERRITIN, TIBC, IRON, RETICCTPCT in the last 72 hours. Sepsis Labs: Recent Labs  Lab 05/25/21 1310 05/25/21 1543 05/25/21 1834  05/26/21 0145 05/26/21 0500 05/27/21 0409 05/28/21 0535  PROCALCITON <0.10  --   --   --  21.00 17.56  --   WBC 7.7  --   --   --  15.2* 22.9* 17.4*  LATICACIDVEN  --    < > >9.0* >9.0* 7.6* 1.9  --    < > = values in this interval not displayed.    Microbiology Recent Results (from the past 240 hour(s))  Urine Culture     Status: Abnormal   Collection Time: 05/25/21  1:00 PM   Specimen: Urine, Random  Result Value Ref Range Status   Specimen Description   Final    URINE, RANDOM Performed at [facility], [address], [place], [place] [address]    Special Requests   Final    NONE Performed at [facility], [address] [place], [place] [address]    Culture (A)  Final    <10,000 COLONIES/mL INSIGNIFICANT GROWTH Performed at [facility], [place] [address] [place], [place] [address]    Report Status 05/27/2021 FINAL  Final  Resp Panel by RT-PCR (Flu A&B, Covid) Nasopharyngeal Swab     Status: None   Collection Time: 05/25/21  2:28 PM   Specimen: Nasopharyngeal Swab; Nasopharyngeal(NP) swabs in vial transport medium  Result Value Ref Range Status   SARS Coronavirus 2 by RT PCR NEGATIVE NEGATIVE Final    Comment: (NOTE) SARS-CoV-2 target nucleic acids are NOT DETECTED.  The SARS-CoV-2 RNA is generally detectable in upper respiratory specimens during the acute phase of infection. The lowest concentration of SARS-CoV-2 viral copies this assay can detect is 138 copies/mL. A negative result does not preclude SARS-Cov-2 infection and  should not be used as the sole basis for treatment or other patient management decisions. A negative result may occur with  improper specimen collection/handling, submission of specimen other than nasopharyngeal swab, presence of viral mutation(s) within the areas targeted by this assay, and inadequate number of viral copies(<138 copies/mL). A negative result must be combined with clinical observations, patient  history, and epidemiological information. The expected result is Negative.  Fact Sheet for Patients:  EntrepreneurPulse.com.au  Fact Sheet for Healthcare Providers:  IncredibleEmployment.be  This test is no t yet approved or cleared by the [place] FDA and  has been authorized for detection and/or diagnosis of SARS-CoV-2 by FDA under an Emergency Use Authorization (EUA). This EUA will remain  in effect (meaning this test can be used) for the duration of the COVID-19 declaration under Section 564(b)(1) of the Act, 21 U.S.C.section 360bbb-3(b)(1), unless the authorization is terminated  or revoked sooner.       Influenza A by PCR NEGATIVE NEGATIVE Final   Influenza B by PCR NEGATIVE NEGATIVE Final    Comment: (NOTE) The Xpert Xpress SARS-CoV-2/FLU/RSV plus assay is intended as an aid in the diagnosis of influenza from Nasopharyngeal swab specimens and should not be used as a sole basis for treatment. Nasal washings and aspirates are unacceptable for Xpert Xpress SARS-CoV-2/FLU/RSV testing.  Fact Sheet for Patients: EntrepreneurPulse.com.au  Fact Sheet for Healthcare Providers: IncredibleEmployment.be  This test is not yet approved or cleared by the [place] FDA and has been authorized for detection and/or diagnosis of SARS-CoV-2 by FDA under an Emergency Use Authorization (EUA). This EUA will remain in effect (meaning this test can be used) for the duration of the COVID-19 declaration under Section 564(b)(1) of the Act, 21 U.S.C. section 360bbb-3(b)(1), unless the authorization is terminated or revoked.  Performed at [facility], [address] [place], [place] [address]   Culture, blood (routine x 2)     Status: None (Preliminary result)   Collection Time: 05/25/21  3:43 PM   Specimen: BLOOD  Result Value Ref Range Status   Specimen Description BLOOD LEFT ARM  Final   Special  Requests   Final    BOTTLES DRAWN AEROBIC AND ANAEROBIC Blood Culture adequate volume   Culture   Final    NO GROWTH 3 DAYS Performed at [facility], [address], [place], [place] [address]    Report Status PENDING  Incomplete  Culture, blood (routine x 2)     Status: None (Preliminary result)   Collection Time: 05/25/21  8:21 PM   Specimen: BLOOD  Result Value Ref Range Status   Specimen Description BLOOD RARM  Final   Special Requests   Final    BOTTLES DRAWN AEROBIC AND ANAEROBIC Blood Culture adequate volume   Culture   Final    NO GROWTH 3 DAYS Performed at [facility], [address] [place],  [address]    Report Status PENDING  Incomplete  MRSA Next Gen by PCR, Nasal     Status: Abnormal   Collection Time: 05/26/21 10:58 AM   Specimen: Nasal Mucosa; Nasal Swab  Result Value Ref Range Status   MRSA by PCR Next Gen DETECTED (A) NOT DETECTED Final    Comment: RESULT CALLED TO, READ BACK BY AND VERIFIED WITH: EMILY GANNON AT 0263 05/26/21.PMF (NOTE) The GeneXpert MRSA Assay (FDA approved for NASAL specimens only), is one component of a comprehensive MRSA colonization surveillance program. It is not intended to diagnose MRSA infection nor  to guide or monitor treatment for MRSA infections. Test performance is not FDA approved in patients less than 70 years old. Performed at [facility], [address] [place],  [address]     Procedures and diagnostic studies:  DG Chest [facility] 1 View  Result Date: 05/28/2021 CLINICAL DATA:  Acute respiratory failure with hypoxia. EXAM: PORTABLE CHEST 1 VIEW COMPARISON:  May 25, 2021. FINDINGS: Stable cardiomediastinal silhouette. Left lung is clear. Increased right perihilar and basilar opacity is noted concerning for pneumonia. Bony thorax is unremarkable. IMPRESSION: Increased right lung opacity is noted concerning for pneumonia. Electronically Signed   By: Marijo Conception M.D.   On:  05/28/2021 07:58               LOS: 3 days   Kyel Purk  [facility]   Pager on [email or web address]. If 7PM-7AM, please contact night-coverage at [email or web address]     05/28/2021, 4:36 PM

## 2021-05-29 ENCOUNTER — Encounter: Payer: Self-pay | Admitting: Radiology

## 2021-05-29 DIAGNOSIS — J9601 Acute respiratory failure with hypoxia: Secondary | ICD-10-CM | POA: Diagnosis not present

## 2021-05-29 DIAGNOSIS — J690 Pneumonitis due to inhalation of food and vomit: Secondary | ICD-10-CM | POA: Diagnosis not present

## 2021-05-29 DIAGNOSIS — E440 Moderate protein-calorie malnutrition: Secondary | ICD-10-CM

## 2021-05-29 DIAGNOSIS — R6521 Severe sepsis with septic shock: Secondary | ICD-10-CM | POA: Diagnosis not present

## 2021-05-29 DIAGNOSIS — A419 Sepsis, unspecified organism: Secondary | ICD-10-CM | POA: Diagnosis not present

## 2021-05-29 LAB — CBC
HCT: 24.6 % — ABNORMAL LOW (ref 36.0–46.0)
Hemoglobin: 7.9 g/dL — ABNORMAL LOW (ref 12.0–15.0)
MCH: 25.3 pg — ABNORMAL LOW (ref 26.0–34.0)
MCHC: 32.1 g/dL (ref 30.0–36.0)
MCV: 78.8 fL — ABNORMAL LOW (ref 80.0–100.0)
Platelets: 135 10*3/uL — ABNORMAL LOW (ref 150–400)
RBC: 3.12 MIL/uL — ABNORMAL LOW (ref 3.87–5.11)
RDW: 19.9 % — ABNORMAL HIGH (ref 11.5–15.5)
WBC: 12.6 10*3/uL — ABNORMAL HIGH (ref 4.0–10.5)
nRBC: 0 % (ref 0.0–0.2)

## 2021-05-29 LAB — PHOSPHORUS: Phosphorus: 3 mg/dL (ref 2.5–4.6)

## 2021-05-29 LAB — VITAMIN B12: Vitamin B-12: 490 pg/mL (ref 180–914)

## 2021-05-29 LAB — GLUCOSE, CAPILLARY
Glucose-Capillary: 104 mg/dL — ABNORMAL HIGH (ref 70–99)
Glucose-Capillary: 112 mg/dL — ABNORMAL HIGH (ref 70–99)
Glucose-Capillary: 122 mg/dL — ABNORMAL HIGH (ref 70–99)
Glucose-Capillary: 122 mg/dL — ABNORMAL HIGH (ref 70–99)
Glucose-Capillary: 78 mg/dL (ref 70–99)
Glucose-Capillary: 89 mg/dL (ref 70–99)
Glucose-Capillary: 98 mg/dL (ref 70–99)

## 2021-05-29 LAB — BASIC METABOLIC PANEL
Anion gap: 10 (ref 5–15)
BUN: 12 mg/dL (ref 8–23)
CO2: 32 mmol/L (ref 22–32)
Calcium: 7.4 mg/dL — ABNORMAL LOW (ref 8.9–10.3)
Chloride: 102 mmol/L (ref 98–111)
Creatinine, Ser: 0.93 mg/dL (ref 0.44–1.00)
GFR, Estimated: >60 mL/min (ref >60)
Glucose, Bld: 103 mg/dL — ABNORMAL HIGH (ref 70–99)
Potassium: 3.3 mmol/L — ABNORMAL LOW (ref 3.5–5.1)
Sodium: 144 mmol/L (ref 135–145)

## 2021-05-29 MED ORDER — PANTOPRAZOLE SODIUM 40 MG PO TBEC
40.0000 mg | DELAYED_RELEASE_TABLET | Freq: Every day | ORAL | Status: DC
  Administered 2021-05-29 – 2021-05-30 (×2): 40 mg via ORAL
  Filled 2021-05-29 (×2): qty 1

## 2021-05-29 MED ORDER — POTASSIUM CHLORIDE 10 MEQ/50ML IV SOLN
10.0000 meq | INTRAVENOUS | Status: AC
  Administered 2021-05-29 (×3): 10 meq via INTRAVENOUS
  Filled 2021-05-29 (×3): qty 50

## 2021-05-29 MED ORDER — POTASSIUM CHLORIDE 20 MEQ PO PACK: 40.0000 meq | PACK | Freq: Once | ORAL | Status: DC

## 2021-05-29 MED ORDER — AMOXICILLIN-POT CLAVULANATE 875-125 MG PO TABS
1.0000 | ORAL_TABLET | Freq: Two times a day (BID) | ORAL | Status: DC
  Administered 2021-05-30: 09:00:00 1 via ORAL
  Filled 2021-05-29: qty 1

## 2021-05-29 MED ORDER — POTASSIUM CHLORIDE 10 MEQ/50ML IV SOLN
10.0000 meq | INTRAVENOUS | Status: DC
  Filled 2021-05-29 (×3): qty 50

## 2021-05-29 NOTE — Progress Notes (Signed)
Palliative:  Per chart review and discussion with dietician family has requested PEG tube.  Also noted that patient has been previously referred to hospice by [facility]? But family/patient refused.   Discussed with MD. MD had discussion with family - palliative consult cancelled.   Juel Burrow, DNP, AGNP-C Palliative Medicine Team Team Phone # [phone]  Pager # [phone]  NO CHARGE

## 2021-05-29 NOTE — Progress Notes (Signed)
Occupational Therapy Treatment Patient Details Name: Veronica Anderson MRN: [id number] DOB: Apr 02, 1946 Today's Date: 05/29/2021   History of present illness Pt. is a 75 y.o. female with past medical history of hypertension, hyperlipidemia, diabetes, CKD, CHF, and stroke with right hemiparesis who presents to the ED complaining of shortness of breath and altered mental status found to be in acute hypoxic respiratory failure due to suspected CAP. Central venous catheter placed on 05/26/21.   OT comments  Pt. Tolerated AROM/AAROM/PROM well to the bilateral UEs. Education was provided to the pt. and her son about ROM, and positioning of the BUEs. Education was provided about opportunities for the pt. To engage her BUEs during tasks throughout her day.  Pt. Could benefit from OT services for ADL training, A/E training, there. Ex, neuromuscular re-education, and pt. Education about home modification, and DME. Pt. Plans to return home upon discharge, with family assistance as needed. No follow-up OT services are warranted at this time.    Recommendations for follow up therapy are one component of a multi-disciplinary discharge planning process, led by the attending physician.  Recommendations may be updated based on patient status, additional functional criteria and insurance authorization.    Follow Up Recommendations  No OT follow up    Assistance Recommended at Discharge    Equipment Recommendations  None recommended by OT    Recommendations for Other Services      Precautions / Restrictions         Mobility Bed Mobility Overal bed mobility: Needs Assistance   Rolling: Total assist              Transfers                   General transfer comment: Deferred due to baseline hoyer     Balance                                           ADL either performed or assessed with clinical judgement   ADL Overall ADL's : Needs assistance/impaired                                        General ADL Comments: TOTAL A for UE & LE ADLs at bed level.    Extremity/Trunk Assessment Upper Extremity Assessment RUE Deficits / Details: Shoulder flexion, abduction: 2/5, elbow flexion, extension: 3/5, gross grip: 3/5 LUE Deficits / Details: Shoulder flexion, abduction: 3/5, elbow flexion, extension: 3/5, gross grip: 3/5            Vision   Additional Comments: TBA   Perception     Praxis      Cognition Arousal/Alertness: Awake/alert Behavior During Therapy: Flat affect Overall Cognitive Status: History of cognitive impairments - at baseline                                            Exercises     Shoulder Instructions       General Comments      Pertinent Vitals/ Pain       Pain Assessment: Faces Faces Pain Scale: No hurt  Home Living  Prior Functioning/Environment              Frequency  Min 2X/week        Progress Toward Goals  OT Goals(current goals can now be found in the care plan section)  Progress towards OT goals: Progressing toward goals  Acute Rehab OT Goals Patient Stated Goal: To return home OT Goal Formulation: With patient Time For Goal Achievement: 06/11/21 Potential to Achieve Goals: Good  Plan      Co-evaluation                 AM-PAC OT "6 Clicks" Daily Activity     Outcome Measure   Help from another person eating meals?: A Lot Help from another person taking care of personal grooming?: A Lot Help from another person toileting, which includes using toliet, bedpan, or urinal?: Total Help from another person bathing (including washing, rinsing, drying)?: Total Help from another person to put on and taking off regular upper body clothing?: A Lot Help from another person to put on and taking off regular lower body clothing?: Total 6 Click Score: 9    End of Session    OT Visit Diagnosis:  Muscle weakness (generalized) (M62.81)   Activity Tolerance Other (comment);Patient tolerated treatment well (COughing following breakfast with nursing)   Patient Left in bed;with call bell/phone within reach;with nursing/sitter in room   Nurse Communication          Time: 5852-7782 OT Time Calculation (min): 23 min  Charges: OT General Charges $OT Visit: 1 Visit OT Treatments $Self Care/Home Management : 23-37 mins  Harrel Carina, MS, OTR/L   Harrel Carina 05/29/2021, 9:46 AM

## 2021-05-29 NOTE — Progress Notes (Signed)
Physical Therapy Treatment Patient Details Name: Veronica Anderson MRN: [id number] DOB: 1946/02/11 Today's Date: 05/29/2021   History of Present Illness Pt. is a 75 y.o. female with past medical history of hypertension, hyperlipidemia, diabetes, CKD, CHF, and stroke with right hemiparesis who presents to the ED complaining of shortness of breath and altered mental status found to be in acute hypoxic respiratory failure due to suspected CAP. Central venous catheter placed on 05/26/21.    PT Comments    Pt received in supine with RN present to assist with bathing and linen changes. Pt requiring totalA for rolling in bed despite cues to reach towards bed rails. Pt demonstrating decreased tolerance to laying in sidelying position for peri-care and linen changes. Per patient's son, he reports noticing decreased AROM of her L shoulder and would like to continue to improve her strength as able in her L arm. Pt requiring PROM of L shoulder horizontal abduction and flexion. Pt's son reporting that he has all the DME equipment that he thinks he needs to care for the patient (ie. Hospital bed, hoyer lift, hospital bed, lift chair, wheelchair). Will continue to work with patient to improve ability to assist with rolling and decrease caregiver burden.     Recommendations for follow up therapy are one component of a multi-disciplinary discharge planning process, led by the attending physician.  Recommendations may be updated based on patient status, additional functional criteria and insurance authorization.  Follow Up Recommendations  No PT follow up     Assistance Recommended at Discharge Frequent or constant Supervision/Assistance  Equipment Recommendations  None recommended by PT    Recommendations for Other Services       Precautions / Restrictions Precautions Precautions: Fall Restrictions Weight Bearing Restrictions: No     Mobility  Bed Mobility Overal bed mobility: Needs Assistance Bed  Mobility: Rolling Rolling: Total assist         General bed mobility comments: Pt cued for reaching towards bedrails however, requiring totalA for reaching towards bed rails    Transfers                   General transfer comment: Deferred due to baseline hoyer    Ambulation/Gait                   Stairs             Wheelchair Mobility    Modified Rankin (Stroke Patients Only)       Balance Overall balance assessment: Needs assistance     Sitting balance - Comments: Unable to achieve seated position                                    Cognition Arousal/Alertness: Awake/alert Behavior During Therapy: Flat affect Overall Cognitive Status: History of cognitive impairments - at baseline                                          Exercises Other Exercises Other Exercises: PROM of L shoulder flexion x 10 and shoulder horizontal abduction x 10 to improve ROM to be able to participate with reaching. Pt assisted with rolling in bed to complete linen change and bed level bath with RN staff.    General Comments        Pertinent Vitals/Pain Pain Assessment:  0-10 Faces Pain Scale: No hurt    Home Living                          Prior Function            PT Goals (current goals can now be found in the care plan section) Acute Rehab PT Goals Patient Stated Goal: none stated PT Goal Formulation: With patient Time For Goal Achievement: 06/10/21 Potential to Achieve Goals: Poor Progress towards PT goals: Progressing toward goals    Frequency    Min 2X/week      PT Plan Current plan remains appropriate    Co-evaluation              AM-PAC PT "6 Clicks" Mobility   Outcome Measure  Help needed turning from your back to your side while in a flat bed without using bedrails?: Total Help needed moving from lying on your back to sitting on the side of a flat bed without using bedrails?:  Total Help needed moving to and from a bed to a chair (including a wheelchair)?: Total Help needed standing up from a chair using your arms (e.g., wheelchair or bedside chair)?: Total Help needed to walk in hospital room?: Total Help needed climbing 3-5 steps with a railing? : Total 6 Click Score: 6    End of Session Equipment Utilized During Treatment: Oxygen Activity Tolerance: Patient limited by fatigue Patient left: in bed;with call bell/phone within reach;with bed alarm set;with family/visitor present Nurse Communication: Mobility status;Need for lift equipment PT Visit Diagnosis: Unsteadiness on feet (R26.81);Muscle weakness (generalized) (M62.81);Other abnormalities of gait and mobility (R26.89)     Time: 1140-1158 PT Time Calculation (min) (ACUTE ONLY): 18 min  Charges:  $Therapeutic Activity: 8-22 mins                     Andrey Campanile, SPT    Andrey Campanile 05/29/2021, 4:40 PM

## 2021-05-29 NOTE — Consult Note (Addendum)
PHARMACY CONSULT NOTE  Pharmacy Consult for Electrolyte Monitoring and Replacement   Recent Labs: Potassium (mmol/L)  Date Value  05/29/2021 3.3 (L)  04/11/2014 3.7   Magnesium (mg/dL)  Date Value  05/28/2021 2.3  04/11/2014 2.0   Calcium (mg/dL)  Date Value  05/29/2021 7.4 (L)   Calcium, Total (mg/dL)  Date Value  04/11/2014 8.1 (L)   Albumin (g/dL)  Date Value  05/27/2021 2.7 (L)  11/14/2015 3.7  04/08/2014 2.7 (L)   Phosphorus (mg/dL)  Date Value  05/29/2021 3.0   Sodium (mmol/L)  Date Value  05/29/2021 144  11/14/2015 145 (H)  04/11/2014 143   Assessment: Patient is a 75 y/o F with medical history including CKD, CHF, asthma, HTN, diabetes, GERD, remote hx CVA, MI who is admitted with acute hypoxic respiratory failure secondary to aspiration pneumonia. Pharmacy consulted to assist with electrolyte monitoring and replacement as indicated.  Nutrition: SLP evaluation 11/15. Started on dysphagia 1 diet; nectar-thick liquids. Medications can be given crushed with puree MIVF: None  Goal of Therapy:  Electrolytes within normal limits  Plan:  --K 3.3, will attempt to replace orally with Kcl 40 mEq x 1 dose --Follow-up electrolytes with AM labs tomorrow  Update: RN requesting replacement be converted to IV. Patient unable to tolerate PO at this time. Will order IV Kcl 10 mEq x 3 doses  Benita Gutter 05/29/2021 7:36 AM

## 2021-05-29 NOTE — Consult Note (Signed)
Chief Complaint: Patient was seen in consultation today for dysphagia and malnutrition at the request of Sharen Hones, MD  Referring Physician(s): Sharen Hones, MD  Supervising Physician: Markus Daft  Patient Status: [place] - In-pt  History of Present Illness: Veronica Anderson is a 75 y.o. female who presented to ED 11/12 with shortness of breath and AMS, found to have septic shock secondary to aspiration pneumonia and has been treated with IV antibiotics with improvement in her respiratory status, now at her baseline. History is obtained per chart review and by the patient's son today, patient is unable to provide. She has PMHx significant for CAD with previous MI, CKD, anemia of chronic disease, DM, OSA and history of CVA with dysphagia and has protein malnutrition. Request received for IR evaluation and gastrostomy tube placement. The patient's son denies any previous abdominal surgeries and denies any known complications to sedation. The son is aware of gastrostomy tubes because another family member has had one placed.   Past Medical History:  Diagnosis Date   Allergy    Anemia    Asthma    Cataract    bilateral   CHF (NYHA class II, ACC/AHA stage C) (HCC)    Chronic kidney disease    stage IV (severe), Dr. Aleene Davidson   Constipation    Diabetes mellitus without complication ([place])    Edema    feet/ankles   GERD (gastroesophageal reflux disease)    Hyperlipidemia    Hypertension    Left ankle pain    Lumbago    Myocardial infarction (HCC)    Neuropathy    Neuropathy    OA (osteoarthritis)    Obesity    Obstructive sleep apnea syndrome    CPAP   Paresthesia    Foot   Protein calorie malnutrition (HCC)    Requires supplemental oxygen    Stroke ([place])    Vitamin D deficiency     Past Surgical History:  Procedure Laterality Date   ANKLE SURGERY Left    CATARACT EXTRACTION W/PHACO Right 12/20/2015   Procedure: CATARACT EXTRACTION PHACO AND INTRAOCULAR LENS PLACEMENT ([place]);   Surgeon: Eulogio Bear, MD;  Location: [facility] ORS;  Service: Ophthalmology;  Laterality: Right;  [place] 2.50 AP% 21.8 CDE 37.04 Fluid pack lot # [id number]   CATARACT EXTRACTION W/PHACO Left 02/14/2016   Procedure: CATARACT EXTRACTION PHACO AND INTRAOCULAR LENS PLACEMENT ([facility]);  Surgeon: Eulogio Bear, MD;  Location: [facility] ORS;  Service: Ophthalmology;  Laterality: Left;  [place] 3.12 AP% 42.1 CDE 47.12 Fluid Pack lot # [id number]   COLONOSCOPY     FRACTURE SURGERY Left    ankle    Allergies: Ace inhibitors  Medications: Prior to Admission medications   Medication Sig Start Date End Date Taking? Authorizing Provider  amLODipine (NORVASC) 10 MG tablet Take 1 tablet (10 mg total) by mouth daily. 10/23/17  Yes Poulose, Bethel Born, NP  aspirin EC 81 MG tablet Take 81 mg by mouth at bedtime.   Yes [provider]  cloNIDine (CATAPRES - DOSED IN MG/24 HR) 0.3 mg/24hr patch PLACE 1 PATCH (0.3 MG TOTAL) ONTO THE SKIN ONCE A WEEK. Patient taking differently: Place 0.3 mg onto the skin every Sunday. 01/28/18  Yes Sowles, Drue Stager, MD  desonide (DESOWEN) 0.05 % ointment Apply 1 application topically 2 (two) times daily. 10/23/17  Yes Poulose, Bethel Born, NP  famotidine (PEPCID) 20 MG tablet Take 20 mg by mouth 2 (two) times daily. 04/30/21  Yes [provider]  ferrous sulfate 325 (  65 FE) MG tablet Take 1 tablet (325 mg total) by mouth 2 (two) times daily with a meal. 09/15/17  Yes Gladstone Lighter, MD  furosemide (LASIX) 40 MG tablet Take 1 tablet (40 mg total) by mouth daily. 10/23/17  Yes Poulose, Bethel Born, NP  gabapentin (NEURONTIN) 300 MG capsule TAKE 1 CAPSULE BY MOUTH THREE TIMES A DAY Patient taking differently: Take 300 mg by mouth 3 (three) times daily. 12/23/17  Yes Sowles, Drue Stager, MD  hydrALAZINE (APRESOLINE) 50 MG tablet Take 50 mg by mouth 3 (three) times daily. 04/30/21  Yes [provider]  medroxyPROGESTERone (PROVERA) 10 MG tablet Take 2 tablets (20 mg total) by  mouth daily. 11/18/17  Yes Gae Dry, MD  metoprolol tartrate (LOPRESSOR) 100 MG tablet Take 100 mg by mouth daily. 04/26/21  Yes [provider]  montelukast (SINGULAIR) 10 MG tablet Take 1 tablet (10 mg total) by mouth daily. 10/23/17  Yes Poulose, Bethel Born, NP  nystatin cream (MYCOSTATIN) Apply 1 application topically 2 (two) times daily. 05/09/21  Yes [provider]  pravastatin (PRAVACHOL) 40 MG tablet TAKE 1 TABLET BY MOUTH EVERY DAY 01/05/18  Yes Poulose, Bethel Born, NP  glucose blood (ACCU-CHEK AVIVA PLUS) test strip 1 each by Other route 2 (two) times daily. Use as instructed 08/06/17   Steele Sizer, MD  glucose blood (ACCU-CHEK AVIVA PLUS) test strip CHECK BLOOD SUGAR TWICE DAILY 10/23/17   Poulose, Bethel Born, NP  Insulin Glargine (LANTUS SOLOSTAR) 100 UNIT/ML Solostar Pen INJECT 35 UNITS SUBCUTANEOUSLY DAILY Patient taking differently: Inject 28 Units into the skin daily. 10/23/17   Poulose, Bethel Born, NP  insulin lispro (HUMALOG KWIKPEN) 100 UNIT/ML KiwkPen INJECT 5 UNITS SUBCUTANEOUSLY BEFORE MEALS; Dx E11.40, LON 99 months 10/23/17   Poulose, Bethel Born, NP  Insulin Pen Needle (NOVOFINE) 32G X 6 MM MISC Inject 1 application into the skin 4 (four) times daily. 10/23/17   Poulose, Bethel Born, NP  Vitamin D, Ergocalciferol, (DRISDOL) 50000 units CAPS capsule TAKE ONE CAPSULE BY MOUTH ONCE A WEEK On Sunday Patient not taking: Reported on 05/25/2021 10/23/17   Fredderick Severance, NP     Family History  Problem Relation Age of Onset   Diabetes type II Other     Social History   Socioeconomic History   Marital status: Single    Spouse name: Not on file   Number of children: Not on file   Years of education: Not on file   Highest education level: Not on file  Occupational History   Not on file  Tobacco Use   Smoking status: Former    Types: Cigarettes    Quit date: 07/14/1994    Years since quitting: 26.8   Smokeless tobacco: Never  Vaping Use    Vaping Use: Never used  Substance and Sexual Activity   Alcohol use: No    Alcohol/week: 0.0 standard drinks   Drug use: No   Sexual activity: Not Currently    Birth control/protection: Post-menopausal  Other Topics Concern   Not on file  Social History Narrative   Not on file   Social Determinants of Health   Financial Resource Strain: Not on file  Food Insecurity: Not on file  Transportation Needs: Not on file  Physical Activity: Not on file  Stress: Not on file  Social Connections: Not on file   Review of Systems: A 12 point ROS discussed and pertinent positives are indicated in the HPI above.  All other systems are negative.  Review of Systems  Vital Signs: BP (!) 121/105   Pulse 85   Temp 98.7 F (37.1 C) (Axillary)   Resp 18   Ht 5\' 4"  (1.626 m)   Wt 190 lb 0.6 oz (86.2 kg)   SpO2 100%   BMI 32.62 kg/m   Physical Exam Constitutional:      Appearance: Normal appearance.     Comments: Son is bedside  Cardiovascular:     Rate and Rhythm: Normal rate and regular rhythm.  Pulmonary:     Effort: Pulmonary effort is normal. No respiratory distress.  Abdominal:     General: Abdomen is flat. There is no distension.     Palpations: Abdomen is soft. There is no mass.     Tenderness: There is no abdominal tenderness.  Skin:    General: Skin is warm and dry.  Neurological:     Mental Status: She is alert. She is disoriented.    Imaging: CT HEAD WO CONTRAST (5MM)  Result Date: 05/25/2021 CLINICAL DATA:  Neuro deficit, acute, stroke suspected EXAM: CT HEAD WITHOUT CONTRAST TECHNIQUE: Contiguous axial images were obtained from the base of the skull through the vertex without intravenous contrast. COMPARISON:  None. FINDINGS: Brain: Remote appearing infarct in the right cerebellum. Remote appearing infarct in the left basal ganglia and right corona radiata. Streak artifact limits evaluation of the posterior fossa with age indeterminate infarct in the pons. Additional  moderate scattered white matter hypodensities nonspecific but compatible with chronic microvascular disease. Moderate generalized atrophy. No evidence of acute hemorrhage, hydrocephalus, or mass lesion. Partially empty sella. Vascular: No hyperdense vessel identified. Calcific intracranial atherosclerosis. Skull: No acute fracture. Sinuses/Orbits: Clear visualized sinuses. Other: Moderate right mastoid effusion. IMPRESSION: 1. Streak artifact limits evaluation of the posterior fossa with age indeterminate infarct in the pons. If there is concern for acute infarct, recommend MRI to further evaluate. 2. Remote appearing infarcts in the right cerebellum, right corona radiata, and left basal ganglia. 3. Moderate chronic microvascular ischemic disease and atrophy. 4. Moderate right mastoid effusion. Electronically Signed   By: Margaretha Sheffield M.D.   On: 05/25/2021 18:38   CT ABDOMEN PELVIS W CONTRAST  Result Date: 05/25/2021 CLINICAL DATA:  Nausea and vomiting. EXAM: CT ABDOMEN AND PELVIS WITH CONTRAST TECHNIQUE: Multidetector CT imaging of the abdomen and pelvis was performed using the standard protocol following bolus administration of intravenous contrast. CONTRAST:  145mL OMNIPAQUE IOHEXOL 300 MG/ML  SOLN COMPARISON:  Abdominopelvic CT 09/10/2017. Chest radiograph earlier today. FINDINGS: Lower chest: Nodular and consolidative airspace opacity in the right lower lobe, with air bronchograms. Minimal airspace disease in the right middle lobe. Trace right pleural effusions/thickening. Mild dependent atelectasis in the left lower lobe. Hepatobiliary: Borderline hepatic steatosis without focal hepatic lesion. Distended gallbladder with intraluminal gallstones. No convincing pericholecystic inflammation by CT. There is no biliary dilatation. Pancreas: Mild fatty atrophy. Spleen: Normal in size without focal abnormality. Adrenals/Urinary Tract: Normal adrenal glands. No hydronephrosis. There is mild right renal  atrophy. Slight heterogeneous right renal enhancement. No focal renal lesion or stone. Absent renal excretion on delayed phase imaging. Moderately distended urinary bladder layering density in the bladder may represent bladder stones or hyperdense debris. Stomach/Bowel: Small hiatal hernia. Fluid/ingested material in the stomach without abnormal gastric distension. Small duodenal diverticulum. No small bowel obstruction or inflammation. Small volume of colonic stool. Sigmoid colon is redundant. The appendix is not definitively visualized. Occasional colonic diverticulosis. No diverticulitis. Vascular/Lymphatic: Moderate aortic atherosclerosis. No aortic aneurysm. Patent portal vein. There is  no bulky abdominopelvic adenopathy. Reproductive: Enlarged uterus containing multiple fibroids. Quiescent appearance of the ovaries. Other: No free air or free fluid. There is mild midline laxity of the anterior abdominal wall. Musculoskeletal: Thoracolumbar degenerative change. There are no acute or suspicious osseous abnormalities. IMPRESSION: 1. Nodular and consolidative airspace opacity in the right lower lobe with air bronchograms, suspicious for pneumonia. Minimal airspace disease in the right middle lobe. In the setting of vomiting, consider aspiration. Trace right pleural effusion/thickening. 2. Distended gallbladder with intraluminal gallstones. No convincing pericholecystic inflammation by CT. If there is clinical concern for acute cholecystitis, recommend right upper quadrant ultrasound. 3. Absent renal excretion on delayed phase imaging suggesting underlying renal dysfunction. 4. Moderately distended urinary bladder with layering density in the bladder may represent bladder stones or hyperdense debris. 5. Uterine fibroids. 6. Colonic diverticulosis without diverticulitis Aortic Atherosclerosis (ICD10-I70.0). Electronically Signed   By: Keith Rake M.D.   On: 05/25/2021 22:45   DG Chest Port 1 View  Result  Date: 05/28/2021 CLINICAL DATA:  Acute respiratory failure with hypoxia. EXAM: PORTABLE CHEST 1 VIEW COMPARISON:  May 25, 2021. FINDINGS: Stable cardiomediastinal silhouette. Left lung is clear. Increased right perihilar and basilar opacity is noted concerning for pneumonia. Bony thorax is unremarkable. IMPRESSION: Increased right lung opacity is noted concerning for pneumonia. Electronically Signed   By: Marijo Conception M.D.   On: 05/28/2021 07:58   DG Chest Portable 1 View  Result Date: 05/25/2021 CLINICAL DATA:  Shortness of breath for 4 days. EXAM: PORTABLE CHEST 1 VIEW COMPARISON:  June 28, 2018 FINDINGS: The mediastinal contour and cardiac silhouette are stable. There is chronic elevation of the right hemidiaphragm. Mild patchy opacity is noted in the right perihilar region. The left lung is clear. The bony structures are stable. IMPRESSION: Mild patchy opacity noted in the right perihilar region, developing pneumonia is not excluded. Electronically Signed   By: Abelardo Diesel M.D.   On: 05/25/2021 14:03   ECHOCARDIOGRAM COMPLETE  Result Date: 05/27/2021    ECHOCARDIOGRAM REPORT   Patient Name:   DALASIA PREDMORE Date of Exam: 05/26/2021 Medical Rec #:  [id number]    Height:       64.0 in Accession #:    [id number]   Weight:       178.1 lb Date of Birth:  1945/11/01    BSA:          1.862 m Patient Age:    25 years     BP:           91/72 mmHg Patient Gender: F            HR:           90 bpm. Exam Location:  [facility] Procedure: 2D Echo, Cardiac Doppler and Color Doppler Indications:     Congestive Heart Failure I50.9  History:         Patient has prior history of Echocardiogram examinations. CHF,                  Stroke; Risk Factors:Hypertension.  Sonographer:     Alyse Low Roar Referring Phys:  [place] Diagnosing Phys: Serafina Royals MD IMPRESSIONS  1. Left ventricular ejection fraction, by estimation, is 60 to 65%. The left ventricle has normal function. The left ventricle has  no regional wall motion abnormalities. Left ventricular diastolic parameters were normal.  2. Right ventricular systolic function is normal. The right ventricular size is normal.  3. The mitral valve is normal  in structure. Trivial mitral valve regurgitation.  4. The aortic valve is normal in structure. Aortic valve regurgitation is not visualized. FINDINGS  Left Ventricle: Left ventricular ejection fraction, by estimation, is 60 to 65%. The left ventricle has normal function. The left ventricle has no regional wall motion abnormalities. The left ventricular internal cavity size was small. There is no left ventricular hypertrophy. Left ventricular diastolic parameters were normal. Right Ventricle: The right ventricular size is normal. No increase in right ventricular wall thickness. Right ventricular systolic function is normal. Left Atrium: Left atrial size was normal in size. Right Atrium: Right atrial size was normal in size. Pericardium: There is no evidence of pericardial effusion. Mitral Valve: The mitral valve is normal in structure. Trivial mitral valve regurgitation. Tricuspid Valve: The tricuspid valve is normal in structure. Tricuspid valve regurgitation is trivial. Aortic Valve: The aortic valve is normal in structure. Aortic valve regurgitation is not visualized. Aortic valve peak gradient measures 4.2 mmHg. Pulmonic Valve: The pulmonic valve was normal in structure. Pulmonic valve regurgitation is not visualized. Aorta: The aortic root and ascending aorta are structurally normal, with no evidence of dilitation. IAS/Shunts: No atrial level shunt detected by color flow Doppler.  LEFT VENTRICLE PLAX 2D LVIDd:         4.10 cm   Diastology LVIDs:         3.00 cm   LV e' medial:    6.85 cm/s LV PW:         1.00 cm   LV E/e' medial:  10.9 LV IVS:        1.20 cm   LV e' lateral:   5.33 cm/s LVOT diam:     1.90 cm   LV E/e' lateral: 14.0 LVOT Area:     2.84 cm  RIGHT VENTRICLE RV Mid diam:    2.70 cm RV S  prime:     10.30 cm/s TAPSE (M-mode): 1.5 cm LEFT ATRIUM             Index LA diam:        3.60 cm 1.93 cm/m LA Vol (A2C):   37.7 ml 20.24 ml/m LA Vol (A4C):   40.9 ml 21.96 ml/m LA Biplane Vol: 40.6 ml 21.80 ml/m  AORTIC VALVE                 PULMONIC VALVE AV Area (Vmax): 2.56 cm     PV Vmax:        0.84 m/s AV Vmax:        103.00 cm/s  PV Peak grad:   2.8 mmHg AV Peak Grad:   4.2 mmHg     RVOT Peak grad: 3 mmHg LVOT Vmax:      93.00 cm/s  AORTA Ao Root diam: 2.90 cm MITRAL VALVE MV Area (PHT): 12.04 cm    SHUNTS MV Decel Time: 63 msec      Systemic Diam: 1.90 cm MV E velocity: 74.70 cm/s MV A velocity: 110.00 cm/s MV E/A ratio:  0.68 MV A Prime:    8.2 cm/s Serafina Royals MD Electronically signed by Serafina Royals MD Signature Date/Time: 05/27/2021/7:33:54 AM    Final    US Abdomen Limited RUQ (LIVER/GB)  Result Date: 05/26/2021 CLINICAL DATA:  75 year old female with history of gallstones. EXAM: ULTRASOUND ABDOMEN LIMITED RIGHT UPPER QUADRANT COMPARISON:  No priors. FINDINGS: Gallbladder: Gallbladder is only moderately distended. Gallbladder wall thickness is normal at 1.4 mm. There is a large amount of heterogeneously echogenic material lying dependently  in the gallbladder, most compatible with biliary sludge. Most of this appears to be nonshadowing, although there are some small areas with posterior acoustic shadowing, suggesting tiny calculi within this biliary sludge. No pericholecystic fluid. Per report from the sonographer, there was no sonographic Murphy's sign on examination. Common bile duct: Diameter: 6 mm in the porta hepatis. Liver: No focal lesion identified. Within normal limits in parenchymal echogenicity. Portal vein is patent on color Doppler imaging with normal direction of blood flow towards the liver. Other: None. IMPRESSION: 1. Large amount of biliary sludge and tiny gallstones lying dependently in the gallbladder. No findings to suggest an acute cholecystitis at this time.  Electronically Signed   By: Vinnie Langton M.D.   On: 05/26/2021 06:49    Labs:  CBC: Recent Labs    05/26/21 0500 05/27/21 0409 05/28/21 0535 05/29/21 0520  WBC 15.2* 22.9* 17.4* 12.6*  HGB 9.1* 7.0* 8.0* 7.9*  HCT 29.7* 22.6* 24.8* 24.6*  PLT 284 172 138* 135*    COAGS: Recent Labs    05/25/21 1534  INR 1.1  APTT 20*    BMP: Recent Labs    05/26/21 0302 05/26/21 0952 05/27/21 0409 05/27/21 2135 05/28/21 0535 05/29/21 0520  NA 147*  --  146*  --  143 144  K 3.3*   < > 2.9* 2.9* 4.5 3.3*  CL 113*  --  100  --  101 102  CO2 18*  --  32  --  33* 32  GLUCOSE 140*  --  100*  --  92 103*  BUN 16  --  14  --  14 12  CALCIUM 7.8*  --  7.5*  --  7.3* 7.4*  CREATININE 1.15*  --  0.95  --  0.85 0.93  GFRNONAA 50*  --  >60  --  >60 >60   < > = values in this interval not displayed.    LIVER FUNCTION TESTS: Recent Labs    05/26/21 0302 05/27/21 0409  BILITOT 1.0 1.8*  AST 50* 19  ALT 12 10  ALKPHOS 49 46  PROT 5.8* 5.5*  ALBUMIN 2.2* 2.7*   Assessment and Plan: 75 year old female who presented to ED 11/12 with shortness of breath and AMS, found to have septic shock secondary to aspiration pneumonia and has been treated with IV antibiotics with improvement in her respiratory status, now at her baseline. She has PMHx significant for CAD with previous MI, CKD, anemia of chronic disease, DM, OSA and history of CVA with dysphagia and has not been eating well. Request received for IR evaluation and gastrostomy tube placement.   The patient will be NPO after midnight, the case and imaging has been reviewed with my attending, Dr. Anselm Pancoast and anatomy is amendable to percutaneous approach, Lovenox will be held tonight, labs and vitals have been reviewed.  Risks and benefits image guided gastrostomy tube placement was discussed with the patient including, but not limited to bleeding, infection, peritonitis and/or damage to adjacent structures.  All questions were answered,  patient's family is agreeable to proceed.  Consent signed and in chart.   Thank you for this interesting consult.  I greatly enjoyed meeting Veronica Anderson and look forward to participating in their care.  A copy of this report was sent to the requesting provider on this date.  Electronically Signed: Hedy Jacob, PA-C 05/29/2021, 4:06 PM   I spent a total of 20 Minutes in face to face in clinical consultation, greater than 50%  of which was counseling/coordinating care for dysphagia with protein calorie malnutrition.

## 2021-05-29 NOTE — Progress Notes (Signed)
Nutrition Follow-up  DOCUMENTATION CODES:   Non-severe (moderate) malnutrition in context of chronic illness  INTERVENTION:   -Continue Nepro Shake po TID, each supplement provides 425 kcal and 19 grams protein   RD will continue to follow for diet advancement vs the need for nutrition support.    If family wishes to pursue full aggressive care, would recommend consideration of PEG tube placement.    Pt at high refeed risk; recommend monitor potassium, magnesium and phosphorus labs daily until stable   If feeding tube placed, recommend:   Osmolite 1.5@60ml /hr- Initiate at 13ml/hr and increase by 67ml/hr q 8 hours until goal rate is reached.    Free water flushes 165ml q4 hours    Regimen provides 2160kcal/day, 90g/day protein and 1654ml/day of free water    Juven Fruit Punch BID via tube, each serving provides 95kcal and 2.5g of protein (amino acids glutamine and arginine)  NUTRITION DIAGNOSIS:   Moderate Malnutrition related to chronic illness (CVA, CKD, DM, CHF) as evidenced by mild fat depletion, moderate fat depletion, moderate muscle depletion, severe muscle depletion.  Ongoing  GOAL:   Patient will meet greater than or equal to 90% of their needs  Progressing   MONITOR:   Diet advancement, Labs, Weight trends, Skin, I & O's  REASON FOR ASSESSMENT:   Consult Assessment of nutrition requirement/status  ASSESSMENT:   75 y/o female with h/o CVA, HTN, CHF, DM, HLD, GERD, CKD III and OSA who is admitted with aspiration PNA.  11/15- s/p BSE- advanced to dysphagia 1 diet with nectar thick liquids  Reviewed I/O's: +511 ml x 24 hours and +7.8 L since admission  UOP: 485 ml x 24 hours  Spoke with pt at bedside, who was able to answer simple questions. She smiled at this RD when greeted. She reports that she took her medications this morning and drank "some coffee" for breakfast. Noted food residue on outer mouth.   Case discussed with RN, who reports that pt was  able to consume about 15 bites of meal tray with SLP yesterday afternoon, but was unable to consume even half of that later today. Pt with difficult taking foods and liquids and RN had a lot of difficulty administering meds to pt this morning. Given pt's mental status, it will be difficult for pt to meet needs orally at this time; recommend NGT placement vs PEG to be pursued based upon results of goals of care discussions.   Palliative care has been consulted; they are trying to contact son to discuss goals of care. Per RN, pt son reported desire to pursue feeding tube placement.   Medications reviewed and include thiamine.   Lab Results  Component Value Date   HGBA1C 6.6 (H) 05/25/2021   PTA DM medications are .   Labs reviewed: K: 3.3 (on IV supplementation), CBGS: 89-122 (inpatient orders for glycemic control are 0-9 units insulin aspart every 4 hours).    Diet Order:   Diet Order             DIET - DYS 1 Room service appropriate? Yes; Fluid consistency: Nectar Thick  Diet effective now                   EDUCATION NEEDS:   No education needs have been identified at this time  Skin:  Skin Assessment: Skin Integrity Issues: Skin Integrity Issues:: DTI, Stage II DTI: lt heel Stage II: sacrum  Last BM:  05/29/21  Height:   Ht Readings from Last  1 Encounters:  05/25/21 5\' 4"  (1.626 m)    Weight:   Wt Readings from Last 1 Encounters:  05/27/21 86.2 kg    Ideal Body Weight:  54.5 kg  BMI:  Body mass index is 32.62 kg/m.  Estimated Nutritional Needs:   Kcal:  1800-2100kcal/day  Protein:  90-105g/day  Fluid:  1.5-1.7L/day    Loistine Chance, RD, LDN, [place] Registered Dietitian II Certified Diabetes Care and Education Specialist Please refer to AMION for RD and/or RD on-call/weekend/after hours pager

## 2021-05-29 NOTE — Progress Notes (Signed)
PROGRESS NOTE    [name]  RAQ:[id number] DOB: 09/27/1945 DOA: 05/25/2021 PCP: Tracie Harrier, MD   Follow-up on acute on chronic hypoxemic respite failure. Brief Narrative:  Veronica Anderson is a 75 y.o. female with multiple medical problems including but not limited to stroke, CAD with previous MI, CAD, vitamin D deficiency, osteoarthritis, allergic rhinitis, hypertension UTI, CKD stage IV, anemia of chronic kidney disease, chronic diastolic CHF, chronic hypoxemic respiratory failure, type II DM with peripheral neuropathy, OSA.  She was brought to the hospital because of altered mental status and shortness of breath.  She was found to have septic shock secondary to community-acquired pneumonia, possibly aspiration pneumonia, acute hypoxemic respiratory failure and acute metabolic encephalopathy.  She was treated with empiric IV antibiotics, IV fluids and vasopressors.  Her condition improved and she was transferred to [facility] hospitalist service on 05/28/2021.   Assessment & Plan:   Active Problems:   Acute respiratory failure with hypoxia (HCC)   Aspiration pneumonia of right lower lobe (HCC)   Pressure ulcer, sacrum   Malnutrition of moderate degree   Severe sepsis with septic shock (HCC)  Septic shock secondary to aspiration pneumonia. POA Acute on chronic hypoxemic respite failure secondary to aspiration pneumonia Aspiration pneumonia Tooth infection. Acute kidney injury secondary to septic shock. Patient condition had improved, she is back on 2 L oxygen which is her baseline. She has completed 5 days antibiotics with Zosyn, will change to oral Augmentin. Patient would need to follow-up with dentist after discharge from hospital  Acute metabolic encephalopathy. Secondary to septic shock.  Condition had improved.  Failure to thrive. History of stroke. Anorexia. Dysphagia Moderate protein calorie malnutrition. Discussed with speech therapy, patient is on dysphagia 2  diet. Talked with patient and son, patient condition has been deteriorating for at least a month, she is bedbound since last stroke many years ago.  But her appetite has been gradually getting worse over the last month.  She had minimal p.o. intake. Will place a PEG tube at least temporarily.   Hypokalemia Hypernatremia Hypophosphatemia  Recheck levels tomorrow.  Replete potassium today  Anemia chronic disease. Check iron B12 level.  Stage II sacral decubitus ulcers. Follow.    DVT prophylaxis: Lovenox Code Status: full Family Communication: Son updated at bedside Disposition Plan:    Status is: Inpatient  Remains inpatient appropriate because: Severity of disease, need  procedure.        I/O last 3 completed shifts: In: 1082 [P.O.:50; NG/GT:50; IV Piggyback:982] Out: 4562 [Urine:1085] Total I/O In: 259.9 [P.O.:65; IV Piggyback:194.9] Out: 110 [Urine:110]     Consultants:  ICU  Procedures: None  Antimicrobials: Augmentin  Subjective: Patient doing better, she has some short of breath with exertion, oxygenation back to baseline with 2 L. She has intermittent upper stomach pain, no nausea vomiting. No fever or chills. No dysuria hematuria. No chest pain or palpitation.  Objective: Vitals:   05/29/21 0019 05/29/21 0720 05/29/21 0733 05/29/21 0800  BP:    (!) 121/105  Pulse: 79   85  Resp:      Temp:  98.7 F (37.1 C)    TempSrc:  Axillary    SpO2: 100%  98% 100%  Weight:      Height:        Intake/Output Summary (Last 24 hours) at 05/29/2021 1511 Last data filed at 05/29/2021 1349 Gross per 24 hour  Intake 636.44 ml  Output 350 ml  Net 286.44 ml   Filed Weights   05/25/21 1310  05/26/21 0500 05/27/21 0500  Weight: 72.3 kg 80.8 kg 86.2 kg    Examination:  General exam: Appears calm and comfortable  Respiratory system: Clear to auscultation. Respiratory effort normal. Cardiovascular system: S1 & S2 heard, RRR. No JVD, murmurs, rubs,  gallops or clicks. No pedal edema. Gastrointestinal system: Abdomen is nondistended, soft and nontender. No organomegaly or masses felt. Normal bowel sounds heard. Central nervous system: Alert and oriented x2. No focal neurological deficits. Extremities: Symmetric 5 x 5 power. Skin: No rashes, lesions or ulcers Psychiatry:  Mood & affect appropriate.     Data Reviewed: I have personally reviewed following labs and imaging studies  CBC: Recent Labs  Lab 05/25/21 1310 05/26/21 0500 05/27/21 0409 05/28/21 0535 05/29/21 0520  WBC 7.7 15.2* 22.9* 17.4* 12.6*  NEUTROABS 5.8  --   --   --   --   HGB 10.2* 9.1* 7.0* 8.0* 7.9*  HCT 32.7* 29.7* 22.6* 24.8* 24.6*  MCV 80.9 80.3 80.1 80.3 78.8*  PLT 307 284 172 138* 295*   Basic Metabolic Panel: Recent Labs  Lab 05/25/21 2034 05/26/21 0302 05/26/21 0500 05/26/21 0952 05/27/21 0409 05/27/21 2135 05/28/21 0535 05/29/21 0520  NA 146* 147*  --   --  146*  --  143 144  K 3.1* 3.3*  --  3.8 2.9* 2.9* 4.5 3.3*  CL 112* 113*  --   --  100  --  101 102  CO2 19* 18*  --   --  32  --  33* 32  GLUCOSE 224* 140*  --   --  100*  --  92 103*  BUN 20 16  --   --  14  --  14 12  CREATININE 1.30* 1.15*  --   --  0.95  --  0.85 0.93  CALCIUM 8.2* 7.8*  --   --  7.5*  --  7.3* 7.4*  MG 2.1  --  1.7  --  1.9 1.7 2.3  --   PHOS 4.7*  --  3.0  --  1.7* 2.4* 2.1* 3.0   GFR: Estimated Creatinine Clearance: 55.5 mL/min (by C-G formula based on SCr of 0.93 mg/dL). Liver Function Tests: Recent Labs  Lab 05/26/21 0302 05/27/21 0409  AST 50* 19  ALT 12 10  ALKPHOS 49 46  BILITOT 1.0 1.8*  PROT 5.8* 5.5*  ALBUMIN 2.2* 2.7*   No results for input(s): LIPASE, AMYLASE in the last 168 hours. No results for input(s): AMMONIA in the last 168 hours. Coagulation Profile: Recent Labs  Lab 05/25/21 1534  INR 1.1   Cardiac Enzymes: Recent Labs  Lab 05/26/21 0255  CKTOTAL 153   BNP (last 3 results) No results for input(s): PROBNP in the last  8760 hours. HbA1C: No results for input(s): HGBA1C in the last 72 hours. CBG: Recent Labs  Lab 05/28/21 1933 05/29/21 0016 05/29/21 0355 05/29/21 0716 05/29/21 1121  GLUCAP 112* 89 98 104* 122*   Lipid Profile: No results for input(s): CHOL, HDL, LDLCALC, TRIG, CHOLHDL, LDLDIRECT in the last 72 hours. Thyroid Function Tests: No results for input(s): TSH, T4TOTAL, FREET4, T3FREE, THYROIDAB in the last 72 hours. Anemia Panel: No results for input(s): VITAMINB12, FOLATE, FERRITIN, TIBC, IRON, RETICCTPCT in the last 72 hours. Sepsis Labs: Recent Labs  Lab 05/25/21 1310 05/25/21 1543 05/25/21 1834 05/26/21 0145 05/26/21 0500 05/27/21 0409  PROCALCITON <0.10  --   --   --  21.00 17.56  LATICACIDVEN  --    < > >9.0* >  9.0* 7.6* 1.9   < > = values in this interval not displayed.    Recent Results (from the past 240 hour(s))  Urine Culture     Status: Abnormal   Collection Time: 05/25/21  1:00 PM   Specimen: Urine, Random  Result Value Ref Range Status   Specimen Description   Final    URINE, RANDOM Performed at [facility], [address] [place], [place] [address]    Special Requests   Final    NONE Performed at [facility], [address], [place], [place] [address]    Culture (A)  Final    <10,000 COLONIES/mL INSIGNIFICANT GROWTH Performed at [facility] [address], [place], [place] [address]    Report Status 05/27/2021 FINAL  Final  Resp Panel by RT-PCR (Flu A&B, Covid) Nasopharyngeal Swab     Status: None   Collection Time: 05/25/21  2:28 PM   Specimen: Nasopharyngeal Swab; Nasopharyngeal(NP) swabs in vial transport medium  Result Value Ref Range Status   SARS Coronavirus 2 by RT PCR NEGATIVE NEGATIVE Final    Comment: (NOTE) SARS-CoV-2 target nucleic acids are NOT DETECTED.  The SARS-CoV-2 RNA is generally detectable in upper respiratory specimens during the acute phase of infection. The lowest concentration of SARS-CoV-2  viral copies this assay can detect is 138 copies/mL. A negative result does not preclude SARS-Cov-2 infection and should not be used as the sole basis for treatment or other patient management decisions. A negative result may occur with  improper specimen collection/handling, submission of specimen other than nasopharyngeal swab, presence of viral mutation(s) within the areas targeted by this assay, and inadequate number of viral copies(<138 copies/mL). A negative result must be combined with clinical observations, patient history, and epidemiological information. The expected result is Negative.  Fact Sheet for Patients:  EntrepreneurPulse.com.au  Fact Sheet for Healthcare Providers:  IncredibleEmployment.be  This test is no t yet approved or cleared by the [place] FDA and  has been authorized for detection and/or diagnosis of SARS-CoV-2 by FDA under an Emergency Use Authorization (EUA). This EUA will remain  in effect (meaning this test can be used) for the duration of the COVID-19 declaration under Section 564(b)(1) of the Act, 21 U.S.C.section 360bbb-3(b)(1), unless the authorization is terminated  or revoked sooner.       Influenza A by PCR NEGATIVE NEGATIVE Final   Influenza B by PCR NEGATIVE NEGATIVE Final    Comment: (NOTE) The Xpert Xpress SARS-CoV-2/FLU/RSV plus assay is intended as an aid in the diagnosis of influenza from Nasopharyngeal swab specimens and should not be used as a sole basis for treatment. Nasal washings and aspirates are unacceptable for Xpert Xpress SARS-CoV-2/FLU/RSV testing.  Fact Sheet for Patients: EntrepreneurPulse.com.au  Fact Sheet for Healthcare Providers: IncredibleEmployment.be  This test is not yet approved or cleared by the [place] FDA and has been authorized for detection and/or diagnosis of SARS-CoV-2 by FDA under an Emergency Use Authorization  (EUA). This EUA will remain in effect (meaning this test can be used) for the duration of the COVID-19 declaration under Section 564(b)(1) of the Act, 21 U.S.C. section 360bbb-3(b)(1), unless the authorization is terminated or revoked.  Performed at [facility], [address] [place], [place] [address]   Culture, blood (routine x 2)     Status: None (Preliminary result)   Collection Time: 05/25/21  3:43 PM   Specimen: BLOOD  Result Value Ref Range Status   Specimen Description BLOOD LEFT  ARM  Final   Special Requests   Final    BOTTLES DRAWN AEROBIC AND ANAEROBIC Blood Culture adequate volume   Culture   Final    NO GROWTH 4 DAYS Performed at [facility] [address] [place], [place] [address]    Report Status PENDING  Incomplete  Culture, blood (routine x 2)     Status: None (Preliminary result)   Collection Time: 05/25/21  8:21 PM   Specimen: BLOOD  Result Value Ref Range Status   Specimen Description BLOOD RARM  Final   Special Requests   Final    BOTTLES DRAWN AEROBIC AND ANAEROBIC Blood Culture adequate volume   Culture   Final    NO GROWTH 4 DAYS Performed at [facility], [address], [place], [place] [address]    Report Status PENDING  Incomplete  MRSA Next Gen by PCR, Nasal     Status: Abnormal   Collection Time: 05/26/21 10:58 AM   Specimen: Nasal Mucosa; Nasal Swab  Result Value Ref Range Status   MRSA by PCR Next Gen DETECTED (A) NOT DETECTED Final    Comment: RESULT CALLED TO, READ BACK BY AND VERIFIED WITH: EMILY GANNON AT 8295 05/26/21.PMF (NOTE) The GeneXpert MRSA Assay (FDA approved for NASAL specimens only), is one component of a comprehensive MRSA colonization surveillance program. It is not intended to diagnose MRSA infection nor to guide or monitor treatment for MRSA infections. Test performance is not FDA approved in patients less than 88 years old. Performed at [facility], [address],  [place], [place] [address]          Radiology Studies: [facility] Chest [place] 1 View  Result Date: 05/28/2021 CLINICAL DATA:  Acute respiratory failure with hypoxia. EXAM: PORTABLE CHEST 1 VIEW COMPARISON:  May 25, 2021. FINDINGS: Stable cardiomediastinal silhouette. Left lung is clear. Increased right perihilar and basilar opacity is noted concerning for pneumonia. Bony thorax is unremarkable. IMPRESSION: Increased right lung opacity is noted concerning for pneumonia. Electronically Signed   By: Marijo Conception M.D.   On: 05/28/2021 07:58        Scheduled Meds:  [START ON 05/30/2021] amoxicillin-clavulanate  1 tablet Oral Q12H   budesonide (PULMICORT) nebulizer solution  0.25 mg Nebulization BID   chlorhexidine  15 mL Mouth Rinse BID   Chlorhexidine Gluconate Cloth  6 each Topical Daily   enoxaparin (LOVENOX) injection  40 mg Subcutaneous Q24H   feeding supplement (NEPRO CARB STEADY)  237 mL Oral TID BM   insulin aspart  0-9 Units Subcutaneous Q4H   mouth rinse  15 mL Mouth Rinse q12n4p   medroxyPROGESTERone  20 mg Oral Daily   multivitamin with minerals  1 tablet Oral Daily   mupirocin ointment  1 application Nasal BID   pantoprazole  40 mg Oral Daily   thiamine injection  100 mg Intravenous Q24H   Continuous Infusions:  sodium chloride Stopped (05/27/21 1435)   piperacillin-tazobactam (ZOSYN)  IV 3.375 g (05/29/21 1442)     LOS: 4 days    Time spent: 37 minutes    Sharen Hones, MD [facility]   To contact the attending provider between 7A-7P or the covering provider during after hours 7P-7A, please log into the web site [email or web address] and access using universal [place] password for that web site. If you do not have the password, please call the hospital operator.  05/29/2021, 3:11 PM

## 2021-05-29 NOTE — Plan of Care (Signed)
  Problem: Health Behavior/Discharge Planning: Goal: Ability to manage health-related needs will improve Outcome: Progressing   Problem: Clinical Measurements: Goal: Ability to maintain clinical measurements within normal limits will improve Outcome: Progressing   Problem: Coping: Goal: Level of anxiety will decrease Outcome: Progressing   Problem: Education: Goal: Knowledge of General Education information will improve Description: Including pain rating scale, medication(s)/side effects and non-pharmacologic comfort measures Outcome: Not Progressing   Problem: Clinical Measurements: Goal: Will remain free from infection Outcome: Not Progressing Goal: Diagnostic test results will improve Outcome: Not Progressing Goal: Respiratory complications will improve Outcome: Not Progressing Goal: Cardiovascular complication will be avoided Outcome: Not Progressing   Problem: Activity: Goal: Risk for activity intolerance will decrease Outcome: Not Progressing   Problem: Nutrition: Goal: Adequate nutrition will be maintained Outcome: Not Progressing   Problem: Elimination: Goal: Will not experience complications related to bowel motility Outcome: Not Progressing Goal: Will not experience complications related to urinary retention Outcome: Not Progressing   Problem: Pain Managment: Goal: General experience of comfort will improve Outcome: Not Progressing   Problem: Safety: Goal: Ability to remain free from injury will improve Outcome: Not Progressing   Problem: Skin Integrity: Goal: Risk for impaired skin integrity will decrease Outcome: Not Progressing  Patient total care at home decrease in appetite patient scheduled for peg placement tomorrow plan is to discharge back home with son which is patients care provider. ABT completed as ordered.

## 2021-05-30 ENCOUNTER — Inpatient Hospital Stay: Payer: Medicare Other | Admitting: Radiology

## 2021-05-30 DIAGNOSIS — J9601 Acute respiratory failure with hypoxia: Secondary | ICD-10-CM | POA: Diagnosis not present

## 2021-05-30 DIAGNOSIS — A419 Sepsis, unspecified organism: Secondary | ICD-10-CM | POA: Diagnosis not present

## 2021-05-30 DIAGNOSIS — E440 Moderate protein-calorie malnutrition: Secondary | ICD-10-CM | POA: Diagnosis not present

## 2021-05-30 DIAGNOSIS — J690 Pneumonitis due to inhalation of food and vomit: Secondary | ICD-10-CM | POA: Diagnosis not present

## 2021-05-30 HISTORY — PX: IR GASTROSTOMY TUBE MOD SED: IMG625

## 2021-05-30 LAB — CULTURE, BLOOD (ROUTINE X 2)
Culture: NO GROWTH
Culture: NO GROWTH
Special Requests: ADEQUATE
Special Requests: ADEQUATE

## 2021-05-30 LAB — CBC
HCT: 27.2 % — ABNORMAL LOW (ref 36.0–46.0)
Hemoglobin: 8.7 g/dL — ABNORMAL LOW (ref 12.0–15.0)
MCH: 26 pg (ref 26.0–34.0)
MCHC: 32 g/dL (ref 30.0–36.0)
MCV: 81.4 fL (ref 80.0–100.0)
Platelets: 134 10*3/uL — ABNORMAL LOW (ref 150–400)
RBC: 3.34 MIL/uL — ABNORMAL LOW (ref 3.87–5.11)
RDW: 20.2 % — ABNORMAL HIGH (ref 11.5–15.5)
WBC: 7.6 10*3/uL (ref 4.0–10.5)
nRBC: 0 % (ref 0.0–0.2)

## 2021-05-30 LAB — BASIC METABOLIC PANEL
Anion gap: 15 (ref 5–15)
BUN: 14 mg/dL (ref 8–23)
CO2: 29 mmol/L (ref 22–32)
Calcium: 7.4 mg/dL — ABNORMAL LOW (ref 8.9–10.3)
Chloride: 101 mmol/L (ref 98–111)
Creatinine, Ser: 0.85 mg/dL (ref 0.44–1.00)
GFR, Estimated: >60 mL/min (ref >60)
Glucose, Bld: 109 mg/dL — ABNORMAL HIGH (ref 70–99)
Potassium: 3.2 mmol/L — ABNORMAL LOW (ref 3.5–5.1)
Sodium: 145 mmol/L (ref 135–145)

## 2021-05-30 LAB — IRON AND TIBC
Iron: 89 ug/dL (ref 28–170)
Saturation Ratios: 88 % — ABNORMAL HIGH (ref 10.4–31.8)
TIBC: 101 ug/dL — ABNORMAL LOW (ref 250–450)
UIBC: 12 ug/dL

## 2021-05-30 LAB — GLUCOSE, CAPILLARY
Glucose-Capillary: 116 mg/dL — ABNORMAL HIGH (ref 70–99)
Glucose-Capillary: 124 mg/dL — ABNORMAL HIGH (ref 70–99)
Glucose-Capillary: 167 mg/dL — ABNORMAL HIGH (ref 70–99)
Glucose-Capillary: 84 mg/dL (ref 70–99)
Glucose-Capillary: 97 mg/dL (ref 70–99)

## 2021-05-30 LAB — VITAMIN B1: Vitamin B1 (Thiamine): 290 nmol/L — ABNORMAL HIGH (ref 66.5–200.0)

## 2021-05-30 LAB — PHOSPHORUS: Phosphorus: 2.8 mg/dL (ref 2.5–4.6)

## 2021-05-30 LAB — MAGNESIUM: Magnesium: 2.3 mg/dL (ref 1.7–2.4)

## 2021-05-30 MED ORDER — FENTANYL CITRATE (PF) 100 MCG/2ML IJ SOLN
INTRAMUSCULAR | Status: AC
  Administered 2021-05-30: 11:00:00 25 ug
  Filled 2021-05-30: qty 2

## 2021-05-30 MED ORDER — AMOXICILLIN-POT CLAVULANATE 875-125 MG PO TABS
1.0000 | ORAL_TABLET | Freq: Two times a day (BID) | ORAL | Status: AC
  Administered 2021-05-30 – 2021-05-31 (×3): 1
  Filled 2021-05-30 (×3): qty 1

## 2021-05-30 MED ORDER — ADULT MULTIVITAMIN LIQUID CH
15.0000 mL | Freq: Every day | ORAL | Status: DC
  Administered 2021-05-31: 15 mL
  Filled 2021-05-30: qty 15

## 2021-05-30 MED ORDER — ACETAMINOPHEN 650 MG RE SUPP
650.0000 mg | RECTAL | Status: DC | PRN
  Filled 2021-05-30 (×2): qty 1

## 2021-05-30 MED ORDER — LACTULOSE 10 GM/15ML PO SOLN
20.0000 g | Freq: Once | ORAL | Status: AC
  Administered 2021-05-30: 09:00:00 20 g via ORAL
  Filled 2021-05-30: qty 30

## 2021-05-30 MED ORDER — IOHEXOL 350 MG/ML SOLN
10.0000 mL | Freq: Once | INTRAVENOUS | Status: AC | PRN
  Administered 2021-05-30: 11:00:00 10 mL

## 2021-05-30 MED ORDER — HYDROMORPHONE HCL 1 MG/ML IJ SOLN
0.5000 mg | INTRAMUSCULAR | Status: DC | PRN
  Administered 2021-05-30 – 2021-05-31 (×3): 0.5 mg via INTRAVENOUS
  Filled 2021-05-30 (×3): qty 0.5

## 2021-05-30 MED ORDER — GLUCAGON HCL RDNA (DIAGNOSTIC) 1 MG IJ SOLR
INTRAMUSCULAR | Status: AC
  Administered 2021-05-30: 11:00:00 1 mg
  Filled 2021-05-30: qty 1

## 2021-05-30 MED ORDER — DEXTROSE IN LACTATED RINGERS 5 % IV SOLN: INTRAVENOUS | Status: DC

## 2021-05-30 MED ORDER — LIDOCAINE HCL 1 % IJ SOLN
INTRAMUSCULAR | Status: AC
  Administered 2021-05-30: 11:00:00 10 mL
  Filled 2021-05-30: qty 20

## 2021-05-30 MED ORDER — CEFAZOLIN SODIUM-DEXTROSE 2-4 GM/100ML-% IV SOLN
2.0000 g | INTRAVENOUS | Status: AC
  Administered 2021-05-30: 11:00:00 2 g via INTRAVENOUS

## 2021-05-30 MED ORDER — NEPRO/CARBSTEADY PO LIQD
237.0000 mL | Freq: Three times a day (TID) | ORAL | Status: DC
  Administered 2021-05-31 (×2): 237 mL

## 2021-05-30 MED ORDER — PANTOPRAZOLE 2 MG/ML SUSPENSION
40.0000 mg | Freq: Every day | ORAL | Status: DC
  Administered 2021-05-31 – 2021-06-02 (×3): 40 mg
  Filled 2021-05-30 (×3): qty 20

## 2021-05-30 MED ORDER — MIDAZOLAM HCL 2 MG/2ML IJ SOLN
INTRAMUSCULAR | Status: AC
  Administered 2021-05-30: 11:00:00 0.5 mg
  Filled 2021-05-30: qty 2

## 2021-05-30 MED ORDER — QUETIAPINE FUMARATE 25 MG PO TABS
25.0000 mg | ORAL_TABLET | Freq: Every day | ORAL | Status: DC
  Administered 2021-05-30 – 2021-05-31 (×2): 25 mg via ORAL
  Filled 2021-05-30 (×2): qty 1

## 2021-05-30 MED ORDER — POTASSIUM CHLORIDE 10 MEQ/100ML IV SOLN
10.0000 meq | INTRAVENOUS | Status: AC
  Administered 2021-05-30 (×4): 10 meq via INTRAVENOUS
  Filled 2021-05-30: qty 100

## 2021-05-30 MED ORDER — MEDROXYPROGESTERONE ACETATE 10 MG PO TABS
20.0000 mg | ORAL_TABLET | Freq: Every day | ORAL | Status: DC
  Administered 2021-05-31 – 2021-06-02 (×3): 20 mg
  Filled 2021-05-30 (×3): qty 2

## 2021-05-30 NOTE — Procedures (Signed)
Interventional Radiology Procedure:   Indications: Dysphagia    Procedure: Gastrostomy tube placement  Findings: 20 Fr gastrostomy tube in stomach  Complications: No immediate complications noted.     EBL: Minimal  Plan: Ok to use for medications now.  IR will see tomorrow and anticipate starting tube feeds tomorrow.    Mickie Badders R. Anselm Pancoast, MD  Pager: [phone]

## 2021-05-30 NOTE — Plan of Care (Signed)
  Problem: Clinical Measurements: Goal: Ability to maintain clinical measurements within normal limits will improve Outcome: Progressing Goal: Will remain free from infection Outcome: Progressing Goal: Diagnostic test results will improve Outcome: Progressing Goal: Respiratory complications will improve Outcome: Progressing Goal: Cardiovascular complication will be avoided Outcome: Progressing   Problem: Pain Managment: Goal: General experience of comfort will improve Outcome: Progressing   Pt is alert and oriented to person&place. Son at [facility]. V/S stable. Complained of abdominal & back pain; Tylenol PO given. Had runs of vtach 5 beats with HR of 102, oncall hospitalist informed.

## 2021-05-30 NOTE — TOC Progression Note (Addendum)
Transition of Care (TOC) - Progression Note    Patient Details  Name: Veronica Anderson MRN: [id number] Date of Birth: 11/18/1945  Transition of Care (TOC) CM/SW Contact   C , LCSW Phone Number: 05/30/2021, 3:18 PM  Clinical Narrative:   Met with patient and son. Notified them that home tube feeds would be ordered through [employer]. Per [employer] representative, will need to send her home with 3 days of supplies and formula. Dietician aware. Son is agreeable to a home health nurse if able to secure one. [facility] is reviewing referral. Patient is on nighttime oxygen at home provided through [employer]. Patient also had CPAP at home. Son said she is due for a sleep study so he will reach out to her PCP for that. Son confirmed she will need EMS transport home. Address on facesheet is correct.  Expected Discharge Plan: Home/Self Care Barriers to Discharge: Continued Medical Work up  Expected Discharge Plan and Services Expected Discharge Plan: Home/Self Care In-house Referral: Clinical Social Work   Post Acute Care Choice: Skilled Nursing Facility Living arrangements for the past 2 months: Single Family Home                                       Social Determinants of Health (SDOH) Interventions    Readmission Risk Interventions No flowsheet data found.  

## 2021-05-30 NOTE — Progress Notes (Addendum)
Nutrition Follow Up Note   DOCUMENTATION CODES:   Non-severe (moderate) malnutrition in context of chronic illness  INTERVENTION:   Once appropriate for tube feeds:   Osmolite 1.5- Provide six cartons daily- Initiate with 1/3 carton six times daily and advance a tolerated- Flush with 60m of water before and after each tube feed.  Regimen provides 2130kcal/day, 90g/day protein and 16864mday of free water   Juven Fruit Punch BID via tube, each serving provides 95kcal and 2.5g of protein (amino acids glutamine and arginine)  NUTRITION DIAGNOSIS:   Moderate Malnutrition related to chronic illness (CVA, CKD, DM, CHF) as evidenced by mild fat depletion, moderate fat depletion, moderate muscle depletion, severe muscle depletion.  GOAL:   Patient will meet greater than or equal to 90% of their needs - not met   MONITOR:   Labs, Weight trends, TF tolerance, Skin, I & O's  ASSESSMENT:   [age]/o female with h/o CVA, HTN, CHF, DM, HLD, GERD, CKD III and OSA who is admitted with aspiration PNA.  Pt s/p IR 68F G-tube placement today   Pt with poor appetite and oral intake since admission. Pt eating < 10% of meals. Pt s/p IR G-tube today. Will plan to initiate tube feeds tomorrow. Pt is at high refeed risk. Per chart, pt up ~35lbs since admission; pt +7.9L on her I & Os.   Medications reviewed and include: insulin, MVI, protonix, thiamine, 5% dextrose in LRS _0 /hr  Labs reviewed: K 3.2(L), P 2.8 wnl, Mg 2.3 wnl Hgb 8.7(L), Hct 27.2(L) Cbgs- 116, 97 x 24 hrs  Diet Order:   Diet Order             Diet NPO time specified Except for: Sips with Meds  Diet effective midnight                  EDUCATION NEEDS:   No education needs have been identified at this time  Skin:  Skin Assessment: Skin Integrity Issues: Skin Integrity Issues:: DTI, Stage II DTI: lt heel Stage II: sacrum  Last BM:  11/17- type 6  Height:   Ht Readings from Last 1 Encounters:  05/25/21 _1   (1.626 m)    Weight:   Wt Readings from Last 1 Encounters:  05/30/21 88.2 kg    Ideal Body Weight:  54.5 kg  BMI:  Body mass index is 33.38 kg/m.  Estimated Nutritional Needs:   Kcal:  1800-2100kcal/day  Protein:  90-105g/day  Fluid:  1.5-1.7L/day  CaKoleen DistanceS, RD, LDN Please refer to [facility]or [facility] and/or RD on-call/weekend/after hours pager

## 2021-05-30 NOTE — Plan of Care (Signed)

## 2021-05-30 NOTE — Progress Notes (Signed)
Procedure start.

## 2021-05-30 NOTE — Progress Notes (Signed)
Patient here today for G tube placement per DR Anselm Pancoast, timeout done at this time as to right patient/right procedure/vitals as noted. NPO post MN, prior to procedure. Lovenox held this am.

## 2021-05-30 NOTE — Procedures (Shared)
First sedation at 1030, vitals as charted.

## 2021-05-30 NOTE — Progress Notes (Signed)
SLP Cancellation Note  Patient Details Name: Temica Righetti MRN: [id number] DOB: 1946-04-05   Cancelled treatment:       Reason Eval/Treat Not Completed: Patient at procedure or test/unavailable (chart reviewed; pt is NPO for PEG placement per Family request. ST will f/u tomorrow for POC.)     Orinda Kenner, [place], CCC-SLP Speech Language Pathologist Rehab Services [phone] [facility] 05/30/2021, 10:42 AM

## 2021-05-30 NOTE — Progress Notes (Signed)
Procedure/sedation end, vitals as noted, stable

## 2021-05-30 NOTE — Progress Notes (Signed)
PROGRESS NOTE    Idil Maslanka  NUU:[id number] DOB: October 03, 1945 DOA: 05/25/2021 PCP: Tracie Harrier, MD    Brief Narrative:  Veronica Anderson is a 75 y.o. female with multiple medical problems including but not limited to stroke, CAD with previous MI, CAD, vitamin D deficiency, osteoarthritis, allergic rhinitis, hypertension UTI, CKD stage IV, anemia of chronic kidney disease, chronic diastolic CHF, chronic hypoxemic respiratory failure, type II DM with peripheral neuropathy, OSA.  She was brought to the hospital because of altered mental status and shortness of breath.  She was found to have septic shock secondary to community-acquired pneumonia, possibly aspiration pneumonia, acute hypoxemic respiratory failure and acute metabolic encephalopathy.  She was treated with empiric IV antibiotics, IV fluids and vasopressors.  Her condition improved and she was transferred to [facility] hospitalist service on 05/28/2021. PEG tube placed on 11/17, patient has significant dysphagia and a poor appetite.   Assessment & Plan:   Active Problems:   Acute respiratory failure with hypoxia (HCC)   Aspiration pneumonia of right lower lobe (HCC)   Pressure ulcer, sacrum   Malnutrition of moderate degree   Severe sepsis with septic shock (HCC)  Septic shock secondary to aspiration pneumonia. POA Acute on chronic hypoxemic respite failure secondary to aspiration pneumonia Aspiration pneumonia Tooth infection. Acute kidney injury secondary to septic shock. Condition had improved, continue complete 7 days antibiotics, currently on Augmentin.   Acute metabolic encephalopathy. Secondary to septic shock.  Condition had improved.   Failure to thrive. History of stroke. Anorexia. Dysphagia Moderate protein calorie malnutrition. PEG tube is placed today, will start tube feeding tomorrow.  Nutrition consulted.  Sleepiness. Due to sleep deprivation and at nighttime, patient had significant insomnia, will start  Seroquel lower dose.   Hypokalemia Hypernatremia Hypophosphatemia  Condition improved.   Anemia chronic disease. Iron and B12 level adequate.  Albumin is stable  Stage II sacral decubitus ulcers. Follow.       DVT prophylaxis: Lovenox Code Status: full Family Communication: Son updated at bedside Disposition Plan:      Status is: Inpatient   Remains inpatient appropriate because: Severity of disease, need  procedure.          I/O last 3 completed shifts: In: 736.7 [P.O.:65; I.V.:179.2; NG/GT:50; IV Piggyback:442.5] Out: 445 [Urine:445] No intake/output data recorded.     Consultants:  none  Procedures: None  Antimicrobials: Augmentin  Subjective: Patient still has very poor appetite, very little p.o. intake.  PEG tube placed today. Still on 2 L oxygen without any short of breath. No abdominal pain nausea vomiting.  She is constipated, will give a dose of lactulose. She did not sleep at nighttime, she becomes sleepy in the daytime.   Objective: Vitals:   05/30/21 1100 05/30/21 1107 05/30/21 1110 05/30/21 1240  BP: 137/77 136/64 119/63 109/74  Pulse: 98 92 92 90  Resp: 14   16  Temp:    98.4 F (36.9 C)  TempSrc:    Axillary  SpO2: 98% 100% 100% 100%  Weight:      Height:        Intake/Output Summary (Last 24 hours) at 05/30/2021 1250 Last data filed at 05/30/2021 0600 Gross per 24 hour  Intake 395.44 ml  Output 345 ml  Net 50.44 ml   Filed Weights   05/26/21 0500 05/27/21 0500 05/30/21 0500  Weight: 80.8 kg 86.2 kg 88.2 kg    Examination:  General exam: Appears calm and comfortable  Respiratory system: Clear to auscultation. Respiratory effort normal. Cardiovascular  system: S1 & S2 heard, RRR. No JVD, murmurs, rubs, gallops or clicks. No pedal edema. Gastrointestinal system: Abdomen is nondistended, soft and nontender. No organomegaly or masses felt. Normal bowel sounds heard. Central nervous system: Alert and oriented x2. No focal  neurological deficits. Extremities: Symmetric 5 x 5 power. Skin: No rashes, lesions or ulcers Psychiatry: Mood & affect appropriate.     Data Reviewed: I have personally reviewed following labs and imaging studies  CBC: Recent Labs  Lab 05/25/21 1310 05/26/21 0500 05/27/21 0409 05/28/21 0535 05/29/21 0520 05/30/21 0516  WBC 7.7 15.2* 22.9* 17.4* 12.6* 7.6  NEUTROABS 5.8  --   --   --   --   --   HGB 10.2* 9.1* 7.0* 8.0* 7.9* 8.7*  HCT 32.7* 29.7* 22.6* 24.8* 24.6* 27.2*  MCV 80.9 80.3 80.1 80.3 78.8* 81.4  PLT 307 284 172 138* 135* 725*   Basic Metabolic Panel: Recent Labs  Lab 05/26/21 0302 05/26/21 0500 05/26/21 0952 05/27/21 0409 05/27/21 2135 05/28/21 0535 05/29/21 0520 05/30/21 0516  NA 147*  --   --  146*  --  143 144 145  K 3.3*  --    < > 2.9* 2.9* 4.5 3.3* 3.2*  CL 113*  --   --  100  --  101 102 101  CO2 18*  --   --  32  --  33* 32 29  GLUCOSE 140*  --   --  100*  --  92 103* 109*  BUN 16  --   --  14  --  14 12 14   CREATININE 1.15*  --   --  0.95  --  0.85 0.93 0.85  CALCIUM 7.8*  --   --  7.5*  --  7.3* 7.4* 7.4*  MG  --  1.7  --  1.9 1.7 2.3  --  2.3  PHOS  --  3.0  --  1.7* 2.4* 2.1* 3.0 2.8   < > = values in this interval not displayed.   GFR: Estimated Creatinine Clearance: 61.5 mL/min (by C-G formula based on SCr of 0.85 mg/dL). Liver Function Tests: Recent Labs  Lab 05/26/21 0302 05/27/21 0409  AST 50* 19  ALT 12 10  ALKPHOS 49 46  BILITOT 1.0 1.8*  PROT 5.8* 5.5*  ALBUMIN 2.2* 2.7*   No results for input(s): LIPASE, AMYLASE in the last 168 hours. No results for input(s): AMMONIA in the last 168 hours. Coagulation Profile: Recent Labs  Lab 05/25/21 1534  INR 1.1   Cardiac Enzymes: Recent Labs  Lab 05/26/21 0255  CKTOTAL 153   BNP (last 3 results) No results for input(s): PROBNP in the last 8760 hours. HbA1C: No results for input(s): HGBA1C in the last 72 hours. CBG: Recent Labs  Lab 05/29/21 1525 05/29/21 2118  05/29/21 2348 05/30/21 0400 05/30/21 0814  GLUCAP 112* 122* 78 97 116*   Lipid Profile: No results for input(s): CHOL, HDL, LDLCALC, TRIG, CHOLHDL, LDLDIRECT in the last 72 hours. Thyroid Function Tests: No results for input(s): TSH, T4TOTAL, FREET4, T3FREE, THYROIDAB in the last 72 hours. Anemia Panel: Recent Labs    05/29/21 0520 05/29/21 1519  VITAMINB12  --  490  TIBC 101*  --   IRON 89  --    Sepsis Labs: Recent Labs  Lab 05/25/21 1310 05/25/21 1543 05/25/21 1834 05/26/21 0145 05/26/21 0500 05/27/21 0409  PROCALCITON <0.10  --   --   --  21.00 17.56  LATICACIDVEN  --    < > >  9.0* >9.0* 7.6* 1.9   < > = values in this interval not displayed.    Recent Results (from the past 240 hour(s))  Urine Culture     Status: Abnormal   Collection Time: 05/25/21  1:00 PM   Specimen: Urine, Random  Result Value Ref Range Status   Specimen Description   Final    URINE, RANDOM Performed at [facility], [address] [place], [place] [address]    Special Requests   Final    NONE Performed at [facility], [address], [place], [place] [address]    Culture (A)  Final    <10,000 COLONIES/mL INSIGNIFICANT GROWTH Performed at [facility] [address] [place], [place] [address]    Report Status 05/27/2021 FINAL  Final  Resp Panel by RT-PCR (Flu A&B, Covid) Nasopharyngeal Swab     Status: None   Collection Time: 05/25/21  2:28 PM   Specimen: Nasopharyngeal Swab; Nasopharyngeal(NP) swabs in vial transport medium  Result Value Ref Range Status   SARS Coronavirus 2 by RT PCR NEGATIVE NEGATIVE Final    Comment: (NOTE) SARS-CoV-2 target nucleic acids are NOT DETECTED.  The SARS-CoV-2 RNA is generally detectable in upper respiratory specimens during the acute phase of infection. The lowest concentration of SARS-CoV-2 viral copies this assay can detect is 138 copies/mL. A negative result does not preclude SARS-Cov-2 infection and should not be  used as the sole basis for treatment or other patient management decisions. A negative result may occur with  improper specimen collection/handling, submission of specimen other than nasopharyngeal swab, presence of viral mutation(s) within the areas targeted by this assay, and inadequate number of viral copies(<138 copies/mL). A negative result must be combined with clinical observations, patient history, and epidemiological information. The expected result is Negative.  Fact Sheet for Patients:  EntrepreneurPulse.com.au  Fact Sheet for Healthcare Providers:  IncredibleEmployment.be  This test is no t yet approved or cleared by the [place] FDA and  has been authorized for detection and/or diagnosis of SARS-CoV-2 by FDA under an Emergency Use Authorization (EUA). This EUA will remain  in effect (meaning this test can be used) for the duration of the COVID-19 declaration under Section 564(b)(1) of the Act, 21 U.S.C.section 360bbb-3(b)(1), unless the authorization is terminated  or revoked sooner.       Influenza A by PCR NEGATIVE NEGATIVE Final   Influenza B by PCR NEGATIVE NEGATIVE Final    Comment: (NOTE) The Xpert Xpress SARS-CoV-2/FLU/RSV plus assay is intended as an aid in the diagnosis of influenza from Nasopharyngeal swab specimens and should not be used as a sole basis for treatment. Nasal washings and aspirates are unacceptable for Xpert Xpress SARS-CoV-2/FLU/RSV testing.  Fact Sheet for Patients: EntrepreneurPulse.com.au  Fact Sheet for Healthcare Providers: IncredibleEmployment.be  This test is not yet approved or cleared by the [place] FDA and has been authorized for detection and/or diagnosis of SARS-CoV-2 by FDA under an Emergency Use Authorization (EUA). This EUA will remain in effect (meaning this test can be used) for the duration of the COVID-19 declaration under Section  564(b)(1) of the Act, 21 U.S.C. section 360bbb-3(b)(1), unless the authorization is terminated or revoked.  Performed at [facility], [address] [place], [place] [address]   Culture, blood (routine x 2)     Status: None   Collection Time: 05/25/21  3:43 PM   Specimen: BLOOD  Result Value Ref Range Status   Specimen Description BLOOD LEFT ARM  Final   Special Requests   Final    BOTTLES DRAWN AEROBIC AND ANAEROBIC Blood Culture adequate volume   Culture   Final    NO GROWTH 5 DAYS Performed at [facility], [address] [place], [place] [address]    Report Status 05/30/2021 FINAL  Final  Culture, blood (routine x 2)     Status: None   Collection Time: 05/25/21  8:21 PM   Specimen: BLOOD  Result Value Ref Range Status   Specimen Description BLOOD RARM  Final   Special Requests   Final    BOTTLES DRAWN AEROBIC AND ANAEROBIC Blood Culture adequate volume   Culture   Final    NO GROWTH 5 DAYS Performed at [facility], [address] [place],  [address]    Report Status 05/30/2021 FINAL  Final  MRSA Next Gen by PCR, Nasal     Status: Abnormal   Collection Time: 05/26/21 10:58 AM   Specimen: Nasal Mucosa; Nasal Swab  Result Value Ref Range Status   MRSA by PCR Next Gen DETECTED (A) NOT DETECTED Final    Comment: RESULT CALLED TO, READ BACK BY AND VERIFIED WITH: EMILY GANNON AT 7544 05/26/21.PMF (NOTE) The GeneXpert MRSA Assay (FDA approved for NASAL specimens only), is one component of a comprehensive MRSA colonization surveillance program. It is not intended to diagnose MRSA infection nor to guide or monitor treatment for MRSA infections. Test performance is not FDA approved in patients less than 50 years old. Performed at [facility], [address], [place],  [address]          Radiology Studies: No results found.      Scheduled Meds:  amoxicillin-clavulanate  1 tablet Per Tube Q12H    budesonide (PULMICORT) nebulizer solution  0.25 mg Nebulization BID   chlorhexidine  15 mL Mouth Rinse BID   Chlorhexidine Gluconate Cloth  6 each Topical Daily   feeding supplement (NEPRO CARB STEADY)  237 mL Per Tube TID BM   insulin aspart  0-9 Units Subcutaneous Q4H   mouth rinse  15 mL Mouth Rinse q12n4p   [START ON 05/31/2021] medroxyPROGESTERone  20 mg Per Tube Daily   [START ON 05/31/2021] multivitamin  15 mL Per Tube Daily   mupirocin ointment  1 application Nasal BID   [START ON 05/31/2021] pantoprazole sodium  40 mg Per Tube Daily   thiamine injection  100 mg Intravenous Q24H   Continuous Infusions:  sodium chloride Stopped (05/27/21 1435)   dextrose 5% lactated ringers 50 mL/hr at 05/30/21 0349     LOS: 5 days    Time spent: 26 minutes    Sharen Hones, MD [facility]   To contact the attending provider between 7A-7P or the covering provider during after hours 7P-7A, please log into the web site [email or web address] and access using universal [place] password for that web site. If you do not have the password, please call the hospital operator.  05/30/2021, 12:50 PM

## 2021-05-31 DIAGNOSIS — R6521 Severe sepsis with septic shock: Secondary | ICD-10-CM | POA: Diagnosis not present

## 2021-05-31 DIAGNOSIS — D696 Thrombocytopenia, unspecified: Secondary | ICD-10-CM

## 2021-05-31 DIAGNOSIS — J9601 Acute respiratory failure with hypoxia: Secondary | ICD-10-CM | POA: Diagnosis not present

## 2021-05-31 DIAGNOSIS — A419 Sepsis, unspecified organism: Secondary | ICD-10-CM | POA: Diagnosis not present

## 2021-05-31 DIAGNOSIS — J690 Pneumonitis due to inhalation of food and vomit: Secondary | ICD-10-CM | POA: Diagnosis not present

## 2021-05-31 LAB — MAGNESIUM: Magnesium: 2.2 mg/dL (ref 1.7–2.4)

## 2021-05-31 LAB — CBC WITH DIFFERENTIAL/PLATELET
Abs Immature Granulocytes: 0.07 10*3/uL (ref 0.00–0.07)
Basophils Absolute: 0 10*3/uL (ref 0.0–0.1)
Basophils Relative: 0 %
Eosinophils Absolute: 0 10*3/uL (ref 0.0–0.5)
Eosinophils Relative: 0 %
HCT: 27.2 % — ABNORMAL LOW (ref 36.0–46.0)
Hemoglobin: 8.5 g/dL — ABNORMAL LOW (ref 12.0–15.0)
Immature Granulocytes: 1 %
Lymphocytes Relative: 19 %
Lymphs Abs: 1.4 10*3/uL (ref 0.7–4.0)
MCH: 25.4 pg — ABNORMAL LOW (ref 26.0–34.0)
MCHC: 31.3 g/dL (ref 30.0–36.0)
MCV: 81.4 fL (ref 80.0–100.0)
Monocytes Absolute: 0.4 10*3/uL (ref 0.1–1.0)
Monocytes Relative: 5 %
Neutro Abs: 5.6 10*3/uL (ref 1.7–7.7)
Neutrophils Relative %: 75 %
Platelets: 122 10*3/uL — ABNORMAL LOW (ref 150–400)
RBC: 3.34 MIL/uL — ABNORMAL LOW (ref 3.87–5.11)
RDW: 20.4 % — ABNORMAL HIGH (ref 11.5–15.5)
WBC: 7.5 10*3/uL (ref 4.0–10.5)
nRBC: 0 % (ref 0.0–0.2)

## 2021-05-31 LAB — GLUCOSE, CAPILLARY
Glucose-Capillary: 81 mg/dL (ref 70–99)
Glucose-Capillary: 100 mg/dL — ABNORMAL HIGH (ref 70–99)
Glucose-Capillary: 109 mg/dL — ABNORMAL HIGH (ref 70–99)
Glucose-Capillary: 162 mg/dL — ABNORMAL HIGH (ref 70–99)
Glucose-Capillary: 170 mg/dL — ABNORMAL HIGH (ref 70–99)

## 2021-05-31 LAB — PHOSPHORUS: Phosphorus: 2.7 mg/dL (ref 2.5–4.6)

## 2021-05-31 MED ORDER — FREE WATER
100.0000 mL | Freq: Every day | Status: DC
  Administered 2021-05-31 – 2021-06-02 (×11): 100 mL

## 2021-05-31 MED ORDER — OXYCODONE HCL 5 MG/5ML PO SOLN
5.0000 mg | Freq: Once | ORAL | Status: DC
  Filled 2021-05-31 (×2): qty 5

## 2021-05-31 MED ORDER — POLYETHYLENE GLYCOL 3350 17 G PO PACK: 17.0000 g | PACK | Freq: Every day | ORAL | Status: DC | PRN

## 2021-05-31 MED ORDER — OXYCODONE HCL 5 MG/5ML PO SOLN
5.0000 mg | ORAL | Status: DC | PRN
  Administered 2021-05-31 – 2021-06-02 (×5): 5 mg
  Filled 2021-05-31 (×4): qty 5

## 2021-05-31 MED ORDER — LOPERAMIDE HCL 2 MG PO CAPS
2.0000 mg | ORAL_CAPSULE | Freq: Three times a day (TID) | ORAL | Status: DC
  Administered 2021-05-31: 2 mg via ORAL
  Filled 2021-05-31: qty 1

## 2021-05-31 MED ORDER — JUVEN PO PACK
1.0000 | PACK | Freq: Two times a day (BID) | ORAL | Status: DC
  Administered 2021-06-01 – 2021-06-02 (×4): 1

## 2021-05-31 MED ORDER — OXYBUTYNIN CHLORIDE 5 MG/5ML PO SYRP
5.0000 mg | ORAL_SOLUTION | Freq: Once | ORAL | Status: DC
  Filled 2021-05-31: qty 5

## 2021-05-31 MED ORDER — OSMOLITE 1.2 CAL PO LIQD
237.0000 mL | Freq: Every day | ORAL | Status: DC
  Administered 2021-06-02 (×3): 237 mL

## 2021-05-31 MED ORDER — OSMOLITE 1.5 CAL PO LIQD
120.0000 mL | Freq: Every day | ORAL | Status: AC
  Administered 2021-05-31 – 2021-06-01 (×6): 120 mL

## 2021-05-31 MED ORDER — OXYBUTYNIN CHLORIDE 5 MG/5ML PO SYRP: 5.0000 mg | ORAL_SOLUTION | Freq: Once | ORAL | Status: DC

## 2021-05-31 MED ORDER — OXYBUTYNIN CHLORIDE 5 MG PO TABS: 5.0000 mg | ORAL_TABLET | Freq: Once | ORAL | Status: DC

## 2021-05-31 MED ORDER — QUETIAPINE FUMARATE 25 MG PO TABS
25.0000 mg | ORAL_TABLET | Freq: Every day | ORAL | Status: DC
  Administered 2021-06-01: 25 mg
  Filled 2021-05-31: qty 1

## 2021-05-31 NOTE — TOC Progression Note (Addendum)
Transition of Care [facility]) - Progression Note    Patient Details  Name: Veronica Anderson MRN: [id number] Date of Birth: Nov 15, 1945  Transition of Care [facility]) CM/SW [place], LCSW Phone Number: 05/31/2021, 9:42 AM  Clinical Narrative:   [facility], and [facility] unable to accept home health nurse referral. Left message for [facility] representative.  12:43 pm: [facility], [facility], and [facility] are unable to accept referral. [facility] is checking but doubts they will be able to accept. Was on hold to speak with someone at [facility] for 10 minutes with no answer. Will try again later.  2:40 pm: [facility] is unable to accept referral. Still unable to reach anyone at [facility]. Called to update son. He will reach out to her PCP in 1-2 weeks to see if they can find anyone to accept her. Called [facility] for potential home PCP services but they will not be in network with [facility] Medicare until January 1.  Expected Discharge Plan: Home/Self Care Barriers to Discharge: Continued Medical Work up  Expected Discharge Plan and Services Expected Discharge Plan: Home/Self Care In-house Referral: Clinical Social Work   Post Acute Care Choice: [place] Living arrangements for the past 2 months: Single Family Home                                       Social Determinants of Health (SDOH) Interventions    Readmission Risk Interventions No flowsheet data found.

## 2021-05-31 NOTE — Progress Notes (Signed)
PROGRESS NOTE    Veronica Anderson  GLO:[id number] DOB: 22-Dec-1945 DOA: 05/25/2021 PCP: Tracie Harrier, MD    Brief Narrative:  Veronica Anderson is a 75 y.o. female with multiple medical problems including but not limited to stroke, CAD with previous MI, CAD, vitamin D deficiency, osteoarthritis, allergic rhinitis, hypertension UTI, CKD stage IV, anemia of chronic kidney disease, chronic diastolic CHF, chronic hypoxemic respiratory failure, type II DM with peripheral neuropathy, OSA.  She was brought to the hospital because of altered mental status and shortness of breath.  She was found to have septic shock secondary to community-acquired pneumonia, possibly aspiration pneumonia, acute hypoxemic respiratory failure and acute metabolic encephalopathy.  She was treated with empiric IV antibiotics, IV fluids and vasopressors.  Her condition improved and she was transferred to [facility] hospitalist service on 05/28/2021. PEG tube placed on 11/17, patient has significant dysphagia and a poor appetite.   Assessment & Plan:   Active Problems:   Acute respiratory failure with hypoxia (HCC)   Aspiration pneumonia of right lower lobe (HCC)   Pressure ulcer, sacrum   Malnutrition of moderate degree   Severe sepsis with septic shock (HCC)  Septic shock secondary to aspiration pneumonia. POA Acute on chronic hypoxemic respite failure secondary to aspiration pneumonia Aspiration pneumonia Tooth infection. Acute kidney injury secondary to septic shock. Continue finish up 7 days of antibiotics, currently on Augmentin.  Condition has improved.   Acute metabolic encephalopathy. Mental status has improved to baseline.   Failure to thrive. History of stroke. Anorexia. Dysphagia Moderate protein calorie malnutrition. PEG placed on 11/17, start tube feeding today.   Insomnia. She slept after giving Seroquel.   Hypokalemia Hypernatremia Hypophosphatemia  Level tomorrow.   Anemia chronic  disease. Iron and B12 level adequate.  Albumin is stable  Stage II sacral decubitus ulcers. Follow.       DVT prophylaxis: Lovenox Code Status: full Family Communication: Son updated at bedside Disposition Plan:      Status is: Inpatient   Remains inpatient appropriate because: Severity of disease, keep patient 1 more day to make sure patient can tolerate tube feeding.           I/O last 3 completed shifts: In: 611.3 [I.V.:530.2; IV Piggyback:81.1] Out: 800 [Urine:800] Total I/O In: 100 [Other:100] Out: -      Consultants:  None  Procedures: PEG  Antimicrobials: Augmentin.  Subjective: Patient slept better last night, PEG tube was placed yesterday. Denies any short of breath or cough. No abdominal pain or nausea vomiting. No dysuria or hematuria.  Objective: Vitals:   05/30/21 2135 05/31/21 0437 05/31/21 0441 05/31/21 0813  BP:  110/66  112/83  Pulse:  86  79  Resp:  20  12  Temp:  98.3 F (36.8 C)  (!) 97.3 F (36.3 C)  TempSrc:  Oral  Axillary  SpO2: 99% 100%    Weight:   87.7 kg   Height:        Intake/Output Summary (Last 24 hours) at 05/31/2021 1211 Last data filed at 05/31/2021 1022 Gross per 24 hour  Intake 451 ml  Output 500 ml  Net -49 ml   Filed Weights   05/27/21 0500 05/30/21 0500 05/31/21 0441  Weight: 86.2 kg 88.2 kg 87.7 kg    Examination:  General exam: Appears calm and comfortable  Respiratory system: Clear to auscultation. Respiratory effort normal. Cardiovascular system: S1 & S2 heard, RRR. No JVD, murmurs, rubs, gallops or clicks. No pedal edema. Gastrointestinal system: Abdomen is nondistended, soft  and nontender. No organomegaly or masses felt. Normal bowel sounds heard. Central nervous system: Alert and oriented x2. No focal neurological deficits. Extremities: Symmetric 5 x 5 power. Skin: No rashes, lesions or ulcers Psychiatry: Mood & affect appropriate.     Data Reviewed: I have personally reviewed  following labs and imaging studies  CBC: Recent Labs  Lab 05/25/21 1310 05/26/21 0500 05/27/21 0409 05/28/21 0535 05/29/21 0520 05/30/21 0516  WBC 7.7 15.2* 22.9* 17.4* 12.6* 7.6  NEUTROABS 5.8  --   --   --   --   --   HGB 10.2* 9.1* 7.0* 8.0* 7.9* 8.7*  HCT 32.7* 29.7* 22.6* 24.8* 24.6* 27.2*  MCV 80.9 80.3 80.1 80.3 78.8* 81.4  PLT 307 284 172 138* 135* 433*   Basic Metabolic Panel: Recent Labs  Lab 05/26/21 0302 05/26/21 0500 05/26/21 0952 05/27/21 0409 05/27/21 2135 05/28/21 0535 05/29/21 0520 05/30/21 0516  NA 147*  --   --  146*  --  143 144 145  K 3.3*  --    < > 2.9* 2.9* 4.5 3.3* 3.2*  CL 113*  --   --  100  --  101 102 101  CO2 18*  --   --  32  --  33* 32 29  GLUCOSE 140*  --   --  100*  --  92 103* 109*  BUN 16  --   --  14  --  14 12 14   CREATININE 1.15*  --   --  0.95  --  0.85 0.93 0.85  CALCIUM 7.8*  --   --  7.5*  --  7.3* 7.4* 7.4*  MG  --  1.7  --  1.9 1.7 2.3  --  2.3  PHOS  --  3.0  --  1.7* 2.4* 2.1* 3.0 2.8   < > = values in this interval not displayed.   GFR: Estimated Creatinine Clearance: 61.3 mL/min (by C-G formula based on SCr of 0.85 mg/dL). Liver Function Tests: Recent Labs  Lab 05/26/21 0302 05/27/21 0409  AST 50* 19  ALT 12 10  ALKPHOS 49 46  BILITOT 1.0 1.8*  PROT 5.8* 5.5*  ALBUMIN 2.2* 2.7*   No results for input(s): LIPASE, AMYLASE in the last 168 hours. No results for input(s): AMMONIA in the last 168 hours. Coagulation Profile: Recent Labs  Lab 05/25/21 1534  INR 1.1   Cardiac Enzymes: Recent Labs  Lab 05/26/21 0255  CKTOTAL 153   BNP (last 3 results) No results for input(s): PROBNP in the last 8760 hours. HbA1C: No results for input(s): HGBA1C in the last 72 hours. CBG: Recent Labs  Lab 05/30/21 1929 05/30/21 2332 05/31/21 0434 05/31/21 0819 05/31/21 1145  GLUCAP 124* 84 81 100* 109*   Lipid Profile: No results for input(s): CHOL, HDL, LDLCALC, TRIG, CHOLHDL, LDLDIRECT in the last 72  hours. Thyroid Function Tests: No results for input(s): TSH, T4TOTAL, FREET4, T3FREE, THYROIDAB in the last 72 hours. Anemia Panel: Recent Labs    05/29/21 0520 05/29/21 1519  VITAMINB12  --  490  TIBC 101*  --   IRON 89  --    Sepsis Labs: Recent Labs  Lab 05/25/21 1310 05/25/21 1543 05/25/21 1834 05/26/21 0145 05/26/21 0500 05/27/21 0409  PROCALCITON <0.10  --   --   --  21.00 17.56  LATICACIDVEN  --    < > >9.0* >9.0* 7.6* 1.9   < > = values in this interval not displayed.    Recent  Results (from the past 240 hour(s))  Urine Culture     Status: Abnormal   Collection Time: 05/25/21  1:00 PM   Specimen: Urine, Random  Result Value Ref Range Status   Specimen Description   Final    URINE, RANDOM Performed at [facility], [address] [place], [place] [address]    Special Requests   Final    NONE Performed at [facility], [address] [place], [place] [address]    Culture (A)  Final    <10,000 COLONIES/mL INSIGNIFICANT GROWTH Performed at [facility] [address] [place], [place] [address]    Report Status 05/27/2021 FINAL  Final  Resp Panel by RT-PCR (Flu A&B, Covid) Nasopharyngeal Swab     Status: None   Collection Time: 05/25/21  2:28 PM   Specimen: Nasopharyngeal Swab; Nasopharyngeal(NP) swabs in vial transport medium  Result Value Ref Range Status   SARS Coronavirus 2 by RT PCR NEGATIVE NEGATIVE Final    Comment: (NOTE) SARS-CoV-2 target nucleic acids are NOT DETECTED.  The SARS-CoV-2 RNA is generally detectable in upper respiratory specimens during the acute phase of infection. The lowest concentration of SARS-CoV-2 viral copies this assay can detect is 138 copies/mL. A negative result does not preclude SARS-Cov-2 infection and should not be used as the sole basis for treatment or other patient management decisions. A negative result may occur with  improper specimen collection/handling, submission of specimen  other than nasopharyngeal swab, presence of viral mutation(s) within the areas targeted by this assay, and inadequate number of viral copies(<138 copies/mL). A negative result must be combined with clinical observations, patient history, and epidemiological information. The expected result is Negative.  Fact Sheet for Patients:  EntrepreneurPulse.com.au  Fact Sheet for Healthcare Providers:  IncredibleEmployment.be  This test is no t yet approved or cleared by the [place] FDA and  has been authorized for detection and/or diagnosis of SARS-CoV-2 by FDA under an Emergency Use Authorization (EUA). This EUA will remain  in effect (meaning this test can be used) for the duration of the COVID-19 declaration under Section 564(b)(1) of the Act, 21 U.S.C.section 360bbb-3(b)(1), unless the authorization is terminated  or revoked sooner.       Influenza A by PCR NEGATIVE NEGATIVE Final   Influenza B by PCR NEGATIVE NEGATIVE Final    Comment: (NOTE) The Xpert Xpress SARS-CoV-2/FLU/RSV plus assay is intended as an aid in the diagnosis of influenza from Nasopharyngeal swab specimens and should not be used as a sole basis for treatment. Nasal washings and aspirates are unacceptable for Xpert Xpress SARS-CoV-2/FLU/RSV testing.  Fact Sheet for Patients: EntrepreneurPulse.com.au  Fact Sheet for Healthcare Providers: IncredibleEmployment.be  This test is not yet approved or cleared by the [place] FDA and has been authorized for detection and/or diagnosis of SARS-CoV-2 by FDA under an Emergency Use Authorization (EUA). This EUA will remain in effect (meaning this test can be used) for the duration of the COVID-19 declaration under Section 564(b)(1) of the Act, 21 U.S.C. section 360bbb-3(b)(1), unless the authorization is terminated or revoked.  Performed at [facility], [address]  [place], [place] [address]   Culture, blood (routine x 2)     Status: None   Collection Time: 05/25/21  3:43 PM   Specimen: BLOOD  Result Value Ref Range Status   Specimen Description BLOOD LEFT ARM  Final   Special Requests   Final    BOTTLES DRAWN AEROBIC AND ANAEROBIC Blood Culture  adequate volume   Culture   Final    NO GROWTH 5 DAYS Performed at [facility], [address] [place], [place] [address]    Report Status 05/30/2021 FINAL  Final  Culture, blood (routine x 2)     Status: None   Collection Time: 05/25/21  8:21 PM   Specimen: BLOOD  Result Value Ref Range Status   Specimen Description BLOOD RARM  Final   Special Requests   Final    BOTTLES DRAWN AEROBIC AND ANAEROBIC Blood Culture adequate volume   Culture   Final    NO GROWTH 5 DAYS Performed at [facility], [address], [place], [place] [address]    Report Status 05/30/2021 FINAL  Final  MRSA Next Gen by PCR, Nasal     Status: Abnormal   Collection Time: 05/26/21 10:58 AM   Specimen: Nasal Mucosa; Nasal Swab  Result Value Ref Range Status   MRSA by PCR Next Gen DETECTED (A) NOT DETECTED Final    Comment: RESULT CALLED TO, READ BACK BY AND VERIFIED WITH: EMILY GANNON AT 9892 05/26/21.PMF (NOTE) The GeneXpert MRSA Assay (FDA approved for NASAL specimens only), is one component of a comprehensive MRSA colonization surveillance program. It is not intended to diagnose MRSA infection nor to guide or monitor treatment for MRSA infections. Test performance is not FDA approved in patients less than 34 years old. Performed at [facility], [address], [place], [place] [address]          Radiology Studies: IR GASTROSTOMY TUBE MOD SED  Result Date: 05/30/2021 INDICATION: 75 year old with dysphagia. EXAM: PERCUTANEOUS GASTROSTOMY TUBE WITH FLUOROSCOPIC GUIDANCE Physician: Stephan Minister. Anselm Pancoast, MD MEDICATIONS: Ancef 2 g; Antibiotics were administered within 1 hour of the procedure.  Glucagon 1 mg IV ANESTHESIA/SEDATION: Versed 0.5 mg IV; Fentanyl 25 mcg IV Moderate Sedation Time:  30 minutes The patient was continuously monitored during the procedure by the interventional radiology nurse under my direct supervision. FLUOROSCOPY TIME:  Fluoroscopy Time: 6 minutes, 40 seconds, 74.0 mGy COMPLICATIONS: None immediate. PROCEDURE: Informed consent was obtained for a percutaneous gastrostomy tube. The patient was placed on the interventional table. An orogastric tube was placed with fluoroscopic guidance. The anterior abdomen was prepped and draped in sterile fashion. Maximal barrier sterile technique was utilized including caps, mask, sterile gowns, sterile gloves, sterile drape, hand hygiene and skin antiseptic. Stomach was inflated with air through the orogastric tube. The skin and subcutaneous tissues were anesthetized with 1% lidocaine. A 17 gauge needle was directed into the distended stomach with fluoroscopic guidance. A wire was advanced into the stomach and a T-tact was deployed. A 9-French vascular sheath was placed and the orogastric tube was snared using a Gooseneck snare device. The orogastric tube and snare were pulled out of the patient's mouth. The snare device was connected to a 20-French gastrostomy tube. The snare device and gastrostomy tube were pulled through the patient's mouth and out the anterior abdominal wall. The gastrostomy tube was cut to an appropriate length. Contrast injection through gastrostomy tube confirmed placement within the stomach. Fluoroscopic images were obtained for documentation. The gastrostomy tube was flushed with normal saline. IMPRESSION: Successful fluoroscopic guided percutaneous gastrostomy tube placement. Electronically Signed   By: Markus Daft M.D.   On: 05/30/2021 16:21        Scheduled Meds:  amoxicillin-clavulanate  1 tablet Per Tube Q12H   budesonide (PULMICORT) nebulizer solution  0.25 mg Nebulization BID   chlorhexidine  15 mL Mouth  Rinse BID   Chlorhexidine Gluconate Cloth  6 each Topical Daily   feeding supplement (NEPRO CARB STEADY)  237 mL Per Tube TID BM   insulin aspart  0-9 Units Subcutaneous Q4H   mouth rinse  15 mL Mouth Rinse q12n4p   medroxyPROGESTERone  20 mg Per Tube Daily   multivitamin  15 mL Per Tube Daily   pantoprazole sodium  40 mg Per Tube Daily   QUEtiapine  25 mg Oral QHS   thiamine injection  100 mg Intravenous Q24H   Continuous Infusions:  sodium chloride Stopped (05/27/21 1435)     LOS: 6 days    Time spent: 26 minutes    Sharen Hones, MD [facility]   To contact the attending provider between 7A-7P or the covering provider during after hours 7P-7A, please log into the web site [email or web address] and access using universal [place] password for that web site. If you do not have the password, please call the hospital operator.  05/31/2021, 12:11 PM

## 2021-05-31 NOTE — Progress Notes (Signed)
Rachael Fee NP notified family requests more pain med for pt, order given to give additional dose

## 2021-05-31 NOTE — Progress Notes (Addendum)
SLP Cancellation Note  Patient Details Name: Sung [place] MRN: [id number] DOB: Aug 11, 1945   Cancelled treatment:       Reason Eval/Treat Not Completed: Patient not medically ready (chart reviewed; consulted MD/NSG) Per chart notes, Family discussed w/ Palliative Care and MD and decided on PEG tube placement for pt for nutrition/hydration needs; Dietician notes reviewed -- weight loss of ~100lbs. PEG tube placed on 11/17. Pt has exhibited oropharyngeal phase dysphagia at evaluation/tx sessions during this admit, "Pt exhibited mild-moderate delay during the oral phase (oral holding/delayed oral transit w/ trials)". Pt has Baseline decline per chart notes. She requires total assistance w/ ADLs including feeding.  W/ placement of PEG tube for primary needs, pt and Family can focus on [place] po's of least restrictive consistency when pt's overall status has improved and she can discharge to her next venue of care to receive f/u there. Recommend frequent oral care and aspiration, Reflux precautions. MD updated and agreed w/ NPO status w/ PEG TFs.   Orinda Kenner, MS, CCC-SLP Speech Language Pathologist Rehab Services [phone] [facility] 05/31/2021, 4:17 PM

## 2021-05-31 NOTE — Progress Notes (Signed)
Physical Therapy Treatment Patient Details Name: Veronica Anderson MRN: [id number] DOB: 1946/01/27 Today's Date: 05/31/2021   History of Present Illness Pt. is a 75 y.o. female with past medical history of hypertension, hyperlipidemia, diabetes, CKD, CHF, and stroke with right hemiparesis who presents to the ED complaining of shortness of breath and altered mental status found to be in acute hypoxic respiratory failure due to suspected CAP. Central venous catheter placed on 05/26/21.    PT Comments    Pt seen for modified session due to poor tolerance for B LE/UE ROM. Son present for session, education provided on benefits of proper positioning to maintain upright posture and decrease cervical side bending. Pt's UE's supported on pillows to facilitate fluid return, heels floated off bed. Pt has all equipment she needs to return home and very supportive family.     Recommendations for follow up therapy are one component of a multi-disciplinary discharge planning process, led by the attending physician.  Recommendations may be updated based on patient status, additional functional criteria and insurance authorization.  Follow Up Recommendations  No PT follow up     Assistance Recommended at Discharge Frequent or constant Supervision/Assistance  Equipment Recommendations  None recommended by PT    Recommendations for Other Services       Precautions / Restrictions Precautions Precautions: Fall Restrictions Weight Bearing Restrictions: No     Mobility  Bed Mobility                    Transfers                   General transfer comment: Deferred due to baseline hoyer    Ambulation/Gait                   Stairs             Wheelchair Mobility    Modified Rankin (Stroke Patients Only)       Balance                                            Cognition Arousal/Alertness: Awake/alert Behavior During Therapy: Flat  affect Overall Cognitive Status: History of cognitive impairments - at baseline                                 General Comments: States PLOF accurately (hoyer for OOB and self-feeds).        Exercises General Exercises - Lower Extremity Ankle Circles/Pumps: PROM;Both;10 reps Heel Slides: PROM;Both;10 reps Hip ABduction/ADduction: PROM;Both;10 reps Other Exercises Other Exercises: PROM B Hip IR/ER x 10    General Comments General comments (skin integrity, edema, etc.): Discussed pt's home set up and environment with son who states no equipment needed.      Pertinent Vitals/Pain Pain Assessment: Faces Faces Pain Scale: Hurts a little bit Breathing: occasional labored breathing, short period of hyperventilation Negative Vocalization: repeated troubled calling out, loud moaning/groaning, crying Facial Expression: facial grimacing Body Language: tense, distressed pacing, fidgeting Consolability: distracted or reassured by voice/touch PAINAD Score: 7 Facial Expression: Tense Pain Location:  (All joints) Pain Descriptors / Indicators: Discomfort;Grimacing Pain Intervention(s): Monitored during session;Limited activity within patient's tolerance    Home Living  Prior Function            PT Goals (current goals can now be found in the care plan section) Acute Rehab PT Goals Patient Stated Goal: none stated    Frequency    Min 2X/week      PT Plan Current plan remains appropriate    Co-evaluation              AM-PAC PT "6 Clicks" Mobility   Outcome Measure  Help needed turning from your back to your side while in a flat bed without using bedrails?: Total Help needed moving from lying on your back to sitting on the side of a flat bed without using bedrails?: Total Help needed moving to and from a bed to a chair (including a wheelchair)?: Total Help needed standing up from a chair using your arms (e.g.,  wheelchair or bedside chair)?: Total Help needed to walk in hospital room?: Total Help needed climbing 3-5 steps with a railing? : Total 6 Click Score: 6    End of Session Equipment Utilized During Treatment: Oxygen Activity Tolerance: Patient limited by fatigue;Patient limited by pain Patient left: in bed;with call bell/phone within reach;with bed alarm set;with family/visitor present Nurse Communication: Mobility status;Need for lift equipment PT Visit Diagnosis: Unsteadiness on feet (R26.81);Muscle weakness (generalized) (M62.81);Other abnormalities of gait and mobility (R26.89)     Time: 2355-7322 PT Time Calculation (min) (ACUTE ONLY): 16 min  Charges:  $Therapeutic Exercise: 8-22 mins                    Mikel Cella, PTA    Josie Dixon 05/31/2021, 5:20 PM

## 2021-05-31 NOTE — Care Management Important Message (Signed)
Important Message  Patient Details  Name: Veronica Anderson MRN: [id number] Date of Birth: 09-Apr-1946   Medicare Important Message Given:  Yes  Copy of Medicare IM left in patient's room.  Attempted to contact Otis Dials, son, at [phone] to review Medicare IM, though unable to reach or leave VM at this time.    Dannette Barbara 05/31/2021, 1:48 PM

## 2021-06-01 DIAGNOSIS — E876 Hypokalemia: Secondary | ICD-10-CM

## 2021-06-01 DIAGNOSIS — R6521 Severe sepsis with septic shock: Secondary | ICD-10-CM | POA: Diagnosis not present

## 2021-06-01 DIAGNOSIS — A419 Sepsis, unspecified organism: Secondary | ICD-10-CM | POA: Diagnosis not present

## 2021-06-01 DIAGNOSIS — J9601 Acute respiratory failure with hypoxia: Secondary | ICD-10-CM | POA: Diagnosis not present

## 2021-06-01 DIAGNOSIS — E870 Hyperosmolality and hypernatremia: Secondary | ICD-10-CM

## 2021-06-01 DIAGNOSIS — J690 Pneumonitis due to inhalation of food and vomit: Secondary | ICD-10-CM | POA: Diagnosis not present

## 2021-06-01 LAB — BASIC METABOLIC PANEL
Anion gap: 6 (ref 5–15)
BUN: 15 mg/dL (ref 8–23)
CO2: 31 mmol/L (ref 22–32)
Calcium: 7.6 mg/dL — ABNORMAL LOW (ref 8.9–10.3)
Chloride: 107 mmol/L (ref 98–111)
Creatinine, Ser: 0.9 mg/dL (ref 0.44–1.00)
GFR, Estimated: >60 mL/min (ref >60)
Glucose, Bld: 143 mg/dL — ABNORMAL HIGH (ref 70–99)
Potassium: 3.1 mmol/L — ABNORMAL LOW (ref 3.5–5.1)
Sodium: 144 mmol/L (ref 135–145)

## 2021-06-01 LAB — GLUCOSE, CAPILLARY
Glucose-Capillary: 133 mg/dL — ABNORMAL HIGH (ref 70–99)
Glucose-Capillary: 148 mg/dL — ABNORMAL HIGH (ref 70–99)
Glucose-Capillary: 150 mg/dL — ABNORMAL HIGH (ref 70–99)
Glucose-Capillary: 158 mg/dL — ABNORMAL HIGH (ref 70–99)
Glucose-Capillary: 158 mg/dL — ABNORMAL HIGH (ref 70–99)
Glucose-Capillary: 194 mg/dL — ABNORMAL HIGH (ref 70–99)

## 2021-06-01 LAB — PHOSPHORUS: Phosphorus: 2.1 mg/dL — ABNORMAL LOW (ref 2.5–4.6)

## 2021-06-01 LAB — MAGNESIUM: Magnesium: 2 mg/dL (ref 1.7–2.4)

## 2021-06-01 MED ORDER — LOPERAMIDE HCL 2 MG PO CAPS: 2.0000 mg | ORAL_CAPSULE | Freq: Three times a day (TID) | ORAL | Status: DC | PRN

## 2021-06-01 MED ORDER — POTASSIUM PHOSPHATES 15 MMOLE/5ML IV SOLN
30.0000 mmol | Freq: Once | INTRAVENOUS | Status: AC
  Administered 2021-06-01: 30 mmol via INTRAVENOUS
  Filled 2021-06-01: qty 10

## 2021-06-01 NOTE — Progress Notes (Signed)
PROGRESS NOTE    Veronica Anderson  UKG:[id number] DOB: Oct 01, 1945 DOA: 05/25/2021 PCP: Tracie Harrier, MD   Chief complaint.  Diarrhea. Brief Narrative:  Veronica Anderson is a 75 y.o. female with multiple medical problems including but not limited to stroke, CAD with previous MI, CAD, vitamin D deficiency, osteoarthritis, allergic rhinitis, hypertension UTI, CKD stage IV, anemia of chronic kidney disease, chronic diastolic CHF, chronic hypoxemic respiratory failure, type II DM with peripheral neuropathy, OSA.  She was brought to the hospital because of altered mental status and shortness of breath.  She was found to have septic shock secondary to community-acquired pneumonia, possibly aspiration pneumonia, acute hypoxemic respiratory failure and acute metabolic encephalopathy.  She was treated with empiric IV antibiotics, IV fluids and vasopressors.  Her condition improved and she was transferred to [facility] hospitalist service on 05/28/2021. PEG tube placed on 11/17, patient has significant dysphagia and a poor appetite   Assessment & Plan:   Active Problems:   Acute respiratory failure with hypoxia (HCC)   Aspiration pneumonia of right lower lobe (HCC)   Pressure ulcer, sacrum   Malnutrition of moderate degree   Severe sepsis with septic shock (HCC)   Thrombocytopenia (HCC)   Hypokalemia   Hypophosphatemia  Diarrhea secondary to refeeding. Hypokalemia secondary to diarrhea.  Phosphatemia secondary to refeeding. Patient developed diarrhea after starting tube feeding.  Rectal tube was placed.  She is treated with Imodium.  This is due to refeeding.  Diarrhea seems to be better today, will change Imodium from scheduled to as needed. Will replete potassium and phosphorus, recheck level tomorrow. Will make sure her phosphorus level is normalized before discharge.   Septic shock secondary to aspiration pneumonia. POA Acute on chronic hypoxemic respite failure secondary to aspiration  pneumonia Aspiration pneumonia Tooth infection. Acute kidney injury secondary to septic shock. Antibiotics will be completed today.   Acute metabolic encephalopathy. Mental status has improved to baseline.   Failure to thrive. History of stroke. Anorexia. Dysphagia Moderate protein calorie malnutrition. PEG placed on 11/17, continue tube feeding   Insomnia. She slept after giving Seroquel.   Anemia chronic disease. Thrombocytopenia. Iron and B12 level adequate.  Hemoglobin stable, platelets also stable.  Stage II sacral decubitus ulcers. Follow.  Obstructive sleep apnea. Continue CPAP and  oxygen       DVT prophylaxis: Lovenox Code Status: full Family Communication: Son updated at bedside Disposition Plan:      Status is: Inpatient   Remains inpatient appropriate because: Severity of disease, keep patient 1 more day to make sure patient can tolerate tube feeding.        I/O last 3 completed shifts: In: 220 [Other:100; NG/GT:120] Out: 500 [Urine:500] No intake/output data recorded.     Consultants:  None  Procedures: None  Antimicrobials: None  Subjective: Patient developed significant diarrhea yesterday, started on Imodium.  Rectal tube was placed.  Diarrhea seems to be better today.  She did not have any nausea vomiting or abdominal pain. She has been sleeping the last 2 nights, she does not have any confusion anymore. She denies any short of breath or cough, she is still on 2 L, she is using CPAP at nighttime.   Objective: Vitals:   05/31/21 2057 06/01/21 0500 06/01/21 0528 06/01/21 0804  BP:   (!) 118/59   Pulse:   94   Resp:   18   Temp:   (!) 97.3 F (36.3 C)   TempSrc:   Oral   SpO2: 100%  100% 100%  Weight:  90 kg    Height:        Intake/Output Summary (Last 24 hours) at 06/01/2021 1020 Last data filed at 05/31/2021 2254 Gross per 24 hour  Intake 120 ml  Output --  Net 120 ml   Filed Weights   05/30/21 0500 05/31/21 0441  06/01/21 0500  Weight: 88.2 kg 87.7 kg 90 kg    Examination:  General exam: Appears calm and comfortable  Respiratory system: Clear to auscultation. Respiratory effort normal. Cardiovascular system: S1 & S2 heard, RRR. No JVD, murmurs, rubs, gallops or clicks. No pedal edema. Gastrointestinal system: Abdomen is nondistended, soft and nontender. No organomegaly or masses felt. Normal bowel sounds heard. Central nervous system: Alert and oriented x3. No focal neurological deficits. Extremities: Symmetric 5 x 5 power. Skin: No rashes, lesions or ulcers Psychiatry: Mood & affect appropriate.     Data Reviewed: I have personally reviewed following labs and imaging studies  CBC: Recent Labs  Lab 05/25/21 1310 05/26/21 0500 05/27/21 0409 05/28/21 0535 05/29/21 0520 05/30/21 0516 05/31/21 1247  WBC 7.7   < > 22.9* 17.4* 12.6* 7.6 7.5  NEUTROABS 5.8  --   --   --   --   --  5.6  HGB 10.2*   < > 7.0* 8.0* 7.9* 8.7* 8.5*  HCT 32.7*   < > 22.6* 24.8* 24.6* 27.2* 27.2*  MCV 80.9   < > 80.1 80.3 78.8* 81.4 81.4  PLT 307   < > 172 138* 135* 134* 122*   < > = values in this interval not displayed.   Basic Metabolic Panel: Recent Labs  Lab 05/27/21 0409 05/27/21 2135 05/28/21 0535 05/29/21 0520 05/30/21 0516 05/31/21 1247 06/01/21 0530  NA 146*  --  143 144 145  --  144  K 2.9* 2.9* 4.5 3.3* 3.2*  --  3.1*  CL 100  --  101 102 101  --  107  CO2 32  --  33* 32 29  --  31  GLUCOSE 100*  --  92 103* 109*  --  143*  BUN 14  --  14 12 14   --  15  CREATININE 0.95  --  0.85 0.93 0.85  --  0.90  CALCIUM 7.5*  --  7.3* 7.4* 7.4*  --  7.6*  MG 1.9 1.7 2.3  --  2.3 2.2 2.0  PHOS 1.7* 2.4* 2.1* 3.0 2.8 2.7 2.1*   GFR: Estimated Creatinine Clearance: 58.7 mL/min (by C-G formula based on SCr of 0.9 mg/dL). Liver Function Tests: Recent Labs  Lab 05/26/21 0302 05/27/21 0409  AST 50* 19  ALT 12 10  ALKPHOS 49 46  BILITOT 1.0 1.8*  PROT 5.8* 5.5*  ALBUMIN 2.2* 2.7*   No results  for input(s): LIPASE, AMYLASE in the last 168 hours. No results for input(s): AMMONIA in the last 168 hours. Coagulation Profile: Recent Labs  Lab 05/25/21 1534  INR 1.1   Cardiac Enzymes: Recent Labs  Lab 05/26/21 0255  CKTOTAL 153   BNP (last 3 results) No results for input(s): PROBNP in the last 8760 hours. HbA1C: No results for input(s): HGBA1C in the last 72 hours. CBG: Recent Labs  Lab 05/31/21 1145 05/31/21 1659 05/31/21 2051 06/01/21 0011 06/01/21 0530  GLUCAP 109* 162* 170* 158* 158*   Lipid Profile: No results for input(s): CHOL, HDL, LDLCALC, TRIG, CHOLHDL, LDLDIRECT in the last 72 hours. Thyroid Function Tests: No results for input(s): TSH, T4TOTAL, FREET4, T3FREE, THYROIDAB in the last  72 hours. Anemia Panel: Recent Labs    05/29/21 1519  VITAMINB12 490   Sepsis Labs: Recent Labs  Lab 05/25/21 1310 05/25/21 1543 05/25/21 1834 05/26/21 0145 05/26/21 0500 05/27/21 0409  PROCALCITON <0.10  --   --   --  21.00 17.56  LATICACIDVEN  --    < > >9.0* >9.0* 7.6* 1.9   < > = values in this interval not displayed.    Recent Results (from the past 240 hour(s))  Urine Culture     Status: Abnormal   Collection Time: 05/25/21  1:00 PM   Specimen: Urine, Random  Result Value Ref Range Status   Specimen Description   Final    URINE, RANDOM Performed at [facility], [address], [place], [place] [address]    Special Requests   Final    NONE Performed at [facility], [address], [place], [place] [address]    Culture (A)  Final    <10,000 COLONIES/mL INSIGNIFICANT GROWTH Performed at [facility] [address] [place], [place] [address]    Report Status 05/27/2021 FINAL  Final  Resp Panel by RT-PCR (Flu A&B, Covid) Nasopharyngeal Swab     Status: None   Collection Time: 05/25/21  2:28 PM   Specimen: Nasopharyngeal Swab; Nasopharyngeal(NP) swabs in vial transport medium  Result Value Ref Range Status   SARS  Coronavirus 2 by RT PCR NEGATIVE NEGATIVE Final    Comment: (NOTE) SARS-CoV-2 target nucleic acids are NOT DETECTED.  The SARS-CoV-2 RNA is generally detectable in upper respiratory specimens during the acute phase of infection. The lowest concentration of SARS-CoV-2 viral copies this assay can detect is 138 copies/mL. A negative result does not preclude SARS-Cov-2 infection and should not be used as the sole basis for treatment or other patient management decisions. A negative result may occur with  improper specimen collection/handling, submission of specimen other than nasopharyngeal swab, presence of viral mutation(s) within the areas targeted by this assay, and inadequate number of viral copies(<138 copies/mL). A negative result must be combined with clinical observations, patient history, and epidemiological information. The expected result is Negative.  Fact Sheet for Patients:  EntrepreneurPulse.com.au  Fact Sheet for Healthcare Providers:  IncredibleEmployment.be  This test is no t yet approved or cleared by the [place] FDA and  has been authorized for detection and/or diagnosis of SARS-CoV-2 by FDA under an Emergency Use Authorization (EUA). This EUA will remain  in effect (meaning this test can be used) for the duration of the COVID-19 declaration under Section 564(b)(1) of the Act, 21 U.S.C.section 360bbb-3(b)(1), unless the authorization is terminated  or revoked sooner.       Influenza A by PCR NEGATIVE NEGATIVE Final   Influenza B by PCR NEGATIVE NEGATIVE Final    Comment: (NOTE) The Xpert Xpress SARS-CoV-2/FLU/RSV plus assay is intended as an aid in the diagnosis of influenza from Nasopharyngeal swab specimens and should not be used as a sole basis for treatment. Nasal washings and aspirates are unacceptable for Xpert Xpress SARS-CoV-2/FLU/RSV testing.  Fact Sheet for  Patients: EntrepreneurPulse.com.au  Fact Sheet for Healthcare Providers: IncredibleEmployment.be  This test is not yet approved or cleared by the [place] FDA and has been authorized for detection and/or diagnosis of SARS-CoV-2 by FDA under an Emergency Use Authorization (EUA). This EUA will remain in effect (meaning this test can be used) for the duration of the COVID-19 declaration under Section 564(b)(1) of the Act, 21 U.S.C. section 360bbb-3(b)(1), unless  the authorization is terminated or revoked.  Performed at [facility], [address] [place], [place] [address]   Culture, blood (routine x 2)     Status: None   Collection Time: 05/25/21  3:43 PM   Specimen: BLOOD  Result Value Ref Range Status   Specimen Description BLOOD LEFT ARM  Final   Special Requests   Final    BOTTLES DRAWN AEROBIC AND ANAEROBIC Blood Culture adequate volume   Culture   Final    NO GROWTH 5 DAYS Performed at [facility], [address], [place], [place] [address]    Report Status 05/30/2021 FINAL  Final  Culture, blood (routine x 2)     Status: None   Collection Time: 05/25/21  8:21 PM   Specimen: BLOOD  Result Value Ref Range Status   Specimen Description BLOOD RARM  Final   Special Requests   Final    BOTTLES DRAWN AEROBIC AND ANAEROBIC Blood Culture adequate volume   Culture   Final    NO GROWTH 5 DAYS Performed at [facility], [address] [place], [place] [address]    Report Status 05/30/2021 FINAL  Final  MRSA Next Gen by PCR, Nasal     Status: Abnormal   Collection Time: 05/26/21 10:58 AM   Specimen: Nasal Mucosa; Nasal Swab  Result Value Ref Range Status   MRSA by PCR Next Gen DETECTED (A) NOT DETECTED Final    Comment: RESULT CALLED TO, READ BACK BY AND VERIFIED WITH: EMILY GANNON AT 1448 05/26/21.PMF (NOTE) The GeneXpert MRSA Assay (FDA approved for NASAL specimens only), is one component of a  comprehensive MRSA colonization surveillance program. It is not intended to diagnose MRSA infection nor to guide or monitor treatment for MRSA infections. Test performance is not FDA approved in patients less than 72 years old. Performed at [facility], [address] [place], [place] [address]          Radiology Studies: IR GASTROSTOMY TUBE MOD SED  Result Date: 05/30/2021 INDICATION: 75 year old with dysphagia. EXAM: PERCUTANEOUS GASTROSTOMY TUBE WITH FLUOROSCOPIC GUIDANCE Physician: Stephan Minister. Anselm Pancoast, MD MEDICATIONS: Ancef 2 g; Antibiotics were administered within 1 hour of the procedure. Glucagon 1 mg IV ANESTHESIA/SEDATION: Versed 0.5 mg IV; Fentanyl 25 mcg IV Moderate Sedation Time:  30 minutes The patient was continuously monitored during the procedure by the interventional radiology nurse under my direct supervision. FLUOROSCOPY TIME:  Fluoroscopy Time: 6 minutes, 40 seconds, 14.9 mGy COMPLICATIONS: None immediate. PROCEDURE: Informed consent was obtained for a percutaneous gastrostomy tube. The patient was placed on the interventional table. An orogastric tube was placed with fluoroscopic guidance. The anterior abdomen was prepped and draped in sterile fashion. Maximal barrier sterile technique was utilized including caps, mask, sterile gowns, sterile gloves, sterile drape, hand hygiene and skin antiseptic. Stomach was inflated with air through the orogastric tube. The skin and subcutaneous tissues were anesthetized with 1% lidocaine. A 17 gauge needle was directed into the distended stomach with fluoroscopic guidance. A wire was advanced into the stomach and a T-tact was deployed. A 9-French vascular sheath was placed and the orogastric tube was snared using a Gooseneck snare device. The orogastric tube and snare were pulled out of the patient's mouth. The snare device was connected to a 20-French gastrostomy tube. The snare device and gastrostomy tube were pulled through the  patient's mouth and out the anterior abdominal wall. The gastrostomy tube was cut to an appropriate length. Contrast injection through gastrostomy  tube confirmed placement within the stomach. Fluoroscopic images were obtained for documentation. The gastrostomy tube was flushed with normal saline. IMPRESSION: Successful fluoroscopic guided percutaneous gastrostomy tube placement. Electronically Signed   By: Markus Daft M.D.   On: 05/30/2021 16:21        Scheduled Meds:  budesonide (PULMICORT) nebulizer solution  0.25 mg Nebulization BID   chlorhexidine  15 mL Mouth Rinse BID   Chlorhexidine Gluconate Cloth  6 each Topical Daily   [START ON 06/02/2021] feeding supplement (OSMOLITE 1.2 CAL)  237 mL Per Tube 6 X Daily   feeding supplement (OSMOLITE 1.5 CAL)  120 mL Per Tube 6 X Daily   free water  100 mL Per Tube 6 X Daily   insulin aspart  0-9 Units Subcutaneous Q4H   mouth rinse  15 mL Mouth Rinse q12n4p   medroxyPROGESTERone  20 mg Per Tube Daily   nutrition supplement (JUVEN)  1 packet Per Tube BID BM   oxyCODONE  5 mg Per Tube Once   pantoprazole sodium  40 mg Per Tube Daily   QUEtiapine  25 mg Per Tube QHS   thiamine injection  100 mg Intravenous Q24H   Continuous Infusions:  sodium chloride Stopped (05/27/21 1435)   potassium PHOSPHATE IVPB (in mmol) 30 mmol (06/01/21 0831)     LOS: 7 days    Time spent: 32 minutes,  more than 50% percent time on direct patient care.    Sharen Hones, MD [facility]   To contact the attending provider between 7A-7P or the covering provider during after hours 7P-7A, please log into the web site [email or web address] and access using universal [place] password for that web site. If you do not have the password, please call the hospital operator.  06/01/2021, 10:20 AM

## 2021-06-01 NOTE — Progress Notes (Signed)
Son, Veronica Anderson, states that they will be taking the patient home without home health care and expressed interest in being taught how to administer bolus feedings. Education was given verbally to the son on how to properly give bolus feeds with a correct return verbalization. He was able to watch the morning feed as this RN explained the procedure step by step. For the evening feed he was able to provide a return demonstration on flushing, clamping/unclamping, giving bolus feed and free water flushes.

## 2021-06-02 DIAGNOSIS — J9601 Acute respiratory failure with hypoxia: Secondary | ICD-10-CM | POA: Diagnosis not present

## 2021-06-02 DIAGNOSIS — J690 Pneumonitis due to inhalation of food and vomit: Secondary | ICD-10-CM | POA: Diagnosis not present

## 2021-06-02 LAB — CBC WITH DIFFERENTIAL/PLATELET
Abs Immature Granulocytes: 0.08 10*3/uL — ABNORMAL HIGH (ref 0.00–0.07)
Basophils Absolute: 0 10*3/uL (ref 0.0–0.1)
Basophils Relative: 0 %
Eosinophils Absolute: 0.1 10*3/uL (ref 0.0–0.5)
Eosinophils Relative: 1 %
HCT: 24.7 % — ABNORMAL LOW (ref 36.0–46.0)
Hemoglobin: 7.8 g/dL — ABNORMAL LOW (ref 12.0–15.0)
Immature Granulocytes: 1 %
Lymphocytes Relative: 37 %
Lymphs Abs: 2 10*3/uL (ref 0.7–4.0)
MCH: 25.2 pg — ABNORMAL LOW (ref 26.0–34.0)
MCHC: 31.6 g/dL (ref 30.0–36.0)
MCV: 79.7 fL — ABNORMAL LOW (ref 80.0–100.0)
Monocytes Absolute: 0.8 10*3/uL (ref 0.1–1.0)
Monocytes Relative: 14 %
Neutro Abs: 2.5 10*3/uL (ref 1.7–7.7)
Neutrophils Relative %: 47 %
Platelets: 115 10*3/uL — ABNORMAL LOW (ref 150–400)
RBC: 3.1 MIL/uL — ABNORMAL LOW (ref 3.87–5.11)
RDW: 20.5 % — ABNORMAL HIGH (ref 11.5–15.5)
WBC: 5.5 10*3/uL (ref 4.0–10.5)
nRBC: 0 % (ref 0.0–0.2)

## 2021-06-02 LAB — BASIC METABOLIC PANEL
Anion gap: 5 (ref 5–15)
BUN: 16 mg/dL (ref 8–23)
CO2: 31 mmol/L (ref 22–32)
Calcium: 7.5 mg/dL — ABNORMAL LOW (ref 8.9–10.3)
Chloride: 106 mmol/L (ref 98–111)
Creatinine, Ser: 0.98 mg/dL (ref 0.44–1.00)
GFR, Estimated: >60 mL/min (ref >60)
Glucose, Bld: 145 mg/dL — ABNORMAL HIGH (ref 70–99)
Potassium: 3.4 mmol/L — ABNORMAL LOW (ref 3.5–5.1)
Sodium: 142 mmol/L (ref 135–145)

## 2021-06-02 LAB — PHOSPHORUS: Phosphorus: 2.4 mg/dL — ABNORMAL LOW (ref 2.5–4.6)

## 2021-06-02 LAB — GLUCOSE, CAPILLARY
Glucose-Capillary: 120 mg/dL — ABNORMAL HIGH (ref 70–99)
Glucose-Capillary: 186 mg/dL — ABNORMAL HIGH (ref 70–99)
Glucose-Capillary: 253 mg/dL — ABNORMAL HIGH (ref 70–99)

## 2021-06-02 LAB — MAGNESIUM: Magnesium: 2 mg/dL (ref 1.7–2.4)

## 2021-06-02 MED ORDER — FUROSEMIDE 40 MG PO TABS: 40.0000 mg | ORAL_TABLET | Freq: Every day | ORAL | 0 refills | Status: AC

## 2021-06-02 MED ORDER — PRAVASTATIN SODIUM 40 MG PO TABS: 40.0000 mg | ORAL_TABLET | Freq: Every day | ORAL | 0 refills | Status: AC

## 2021-06-02 MED ORDER — FREE WATER: 100.0000 mL | Freq: Every day | Status: AC

## 2021-06-02 MED ORDER — JUVEN PO PACK: 1.0000 | PACK | Freq: Two times a day (BID) | ORAL | 0 refills | Status: AC

## 2021-06-02 MED ORDER — PANTOPRAZOLE SODIUM 40 MG PO PACK: 40.0000 mg | PACK | Freq: Every day | ORAL | 0 refills | Status: AC

## 2021-06-02 MED ORDER — FERROUS SULFATE 300 (60 FE) MG/5ML PO SYRP: 300.0000 mg | ORAL_SOLUTION | Freq: Every day | ORAL | 0 refills | Status: AC

## 2021-06-02 MED ORDER — GABAPENTIN 300 MG PO CAPS: 300.0000 mg | ORAL_CAPSULE | Freq: Two times a day (BID) | ORAL | 0 refills | Status: AC

## 2021-06-02 MED ORDER — POTASSIUM PHOSPHATE MONOBASIC 500 MG PO TABS: 500.0000 mg | ORAL_TABLET | Freq: Every day | ORAL | 0 refills | Status: AC

## 2021-06-02 MED ORDER — POTASSIUM PHOSPHATES 15 MMOLE/5ML IV SOLN
30.0000 mmol | Freq: Once | INTRAVENOUS | Status: AC
  Administered 2021-06-02: 30 mmol via INTRAVENOUS
  Filled 2021-06-02: qty 10

## 2021-06-02 MED ORDER — OXYCODONE HCL 5 MG/5ML PO SOLN
5.0000 mg | Freq: Four times a day (QID) | ORAL | 0 refills | Status: AC | PRN
Start: 2021-06-02 — End: ?

## 2021-06-02 MED ORDER — OSMOLITE 1.2 CAL PO LIQD: 237.0000 mL | Freq: Every day | ORAL | 0 refills | Status: AC

## 2021-06-02 NOTE — Discharge Summary (Addendum)
Physician Discharge Summary  Patient ID: Veronica Anderson MRN: [id number] DOB/AGE: 1945-07-26 75 y.o.  Admit date: 05/25/2021 Discharge date: 06/02/2021  Admission Diagnoses:  Discharge Diagnoses:  Active Problems:   Acute respiratory failure with hypoxia (HCC)   Aspiration pneumonia of right lower lobe (HCC)   Pressure ulcer, sacrum   Malnutrition of moderate degree   Severe sepsis with septic shock (HCC)   Thrombocytopenia (HCC)   Hypokalemia   Hypophosphatemia   Hypernatremia   Discharged Condition: fair  Hospital Course:  Veronica Anderson is a 74 y.o. female with multiple medical problems including but not limited to stroke, CAD with previous MI, CAD, vitamin D deficiency, osteoarthritis, allergic rhinitis, hypertension UTI, CKD stage IV, anemia of chronic kidney disease, chronic diastolic CHF, chronic hypoxemic respiratory failure, type II DM with peripheral neuropathy, OSA.  She was brought to the hospital because of altered mental status and shortness of breath.  She was found to have septic shock secondary to community-acquired pneumonia, possibly aspiration pneumonia, acute hypoxemic respiratory failure and acute metabolic encephalopathy.  She was treated with empiric IV antibiotics, IV fluids and vasopressors.  Her condition improved and she was transferred to [facility] hospitalist service on 05/28/2021. PEG tube placed on 11/17, patient has significant dysphagia and a poor appetite  Diarrhea secondary to refeeding. Hypokalemia secondary to diarrhea.  Phosphatemia secondary to refeeding. Patient developed diarrhea after starting tube feeding.  Rectal tube was placed.  She is treated with Imodium.  This is due to refeeding.  Diarrhea has resolved today.  Phosphorus level 2.4 today, she was given 30 mmol of potassium phosphate again today.  I also prescribed oral potassium phosphate via tube feeding.  Septic shock secondary to aspiration pneumonia. POA Acute on chronic hypoxemic respite  failure secondary to aspiration pneumonia Aspiration pneumonia Tooth infection. Acute kidney injury secondary to septic shock. Antibiotics is completed.   Acute metabolic encephalopathy. Mental status has improved to baseline.     Failure to thrive. History of stroke. Anorexia. Dysphagia Moderate protein calorie malnutrition. PEG placed on 11/17, continue tube feeding. Patient has been seen by speech therapy, deemed to be still very high risk for aspiration.  Patient is still NPO. Due to high risk of recurrent aspiration, patient long-term prognosis is very poor.  Please refer to palliative care after discharge.    Insomnia. Improved.   Anemia chronic disease. Thrombocytopenia. Iron and B12 level adequate.  Hemoglobin is low, but not requiring blood transfusion, please recheck a CBC at the next office visit.   Stage II sacral decubitus ulcers.POA Refer patient to wound care. Social work was unable to secure home care.  Pressure Injury 09/11/17 Deep Tissue Injury - Purple or maroon localized area of discolored intact skin or blood-filled blister due to damage of underlying soft tissue from pressure and/or shear. boggy (Active)  09/11/17 0200  Location: Heel  Location Orientation: Left  Staging: Deep Tissue Injury - Purple or maroon localized area of discolored intact skin or blood-filled blister due to damage of underlying soft tissue from pressure and/or shear.  Wound Description (Comments): boggy  Present on Admission: Yes     Pressure Injury 05/26/21 Sacrum Bilateral Stage 2 -  Partial thickness loss of dermis presenting as a shallow open injury with a red, pink wound bed without slough. White, brown skin tears/ pressure ulcers (Active)  05/26/21 0000  Location: Sacrum  Location Orientation: Bilateral  Staging: Stage 2 -  Partial thickness loss of dermis presenting as a shallow open injury with a red,  pink wound bed without slough.  Wound Description (Comments):  White, brown skin tears/ pressure ulcers  Present on Admission: Yes         Obstructive sleep apnea. Chronic hypoxemic respite failure on 2 L oxygen. Continue CPAP and  oxygen  Consults: None  Significant Diagnostic Studies:   Treatments: antibiotics, tube feeding  Discharge Exam: Blood pressure 128/64, pulse 98, temperature 99.5 F (37.5 C), temperature source Oral, resp. rate 16, height 5\' 4"  (1.626 m), weight 90 kg, SpO2 100 %. General appearance: alert and cooperative Resp: clear to auscultation bilaterally Cardio: regular rate and rhythm, S1, S2 normal, no murmur, click, rub or gallop GI: soft, non-tender; bowel sounds normal; no masses,  no organomegaly Extremities: extremities normal, atraumatic, no cyanosis or edema  Disposition: Discharge disposition: 01-Home or Self Care       Discharge Instructions     Ambulatory referral to Wound Clinic   Complete by: As directed    Diet - low sodium heart healthy   Complete by: As directed    Discharge wound care:   Complete by: As directed    Refer to wound care   Increase activity slowly   Complete by: As directed       Allergies as of 06/02/2021       Reactions   Ace Inhibitors Cough        Medication List     STOP taking these medications    amLODipine 10 MG tablet Commonly known as: NORVASC   aspirin EC 81 MG tablet   famotidine 20 MG tablet Commonly known as: PEPCID   ferrous sulfate 325 (65 FE) MG tablet Replaced by: ferrous sulfate 300 (60 Fe) MG/5ML syrup   hydrALAZINE 50 MG tablet Commonly known as: APRESOLINE   insulin glargine 100 UNIT/ML Solostar Pen Commonly known as: Lantus SoloStar   insulin lispro 100 UNIT/ML KiwkPen Commonly known as: HumaLOG KwikPen   metoprolol tartrate 100 MG tablet Commonly known as: LOPRESSOR   montelukast 10 MG tablet Commonly known as: SINGULAIR   Vitamin D (Ergocalciferol) 1.25 MG (50000 UNIT) Caps capsule Commonly known as: DRISDOL        TAKE these medications    cloNIDine 0.3 mg/24hr patch Commonly known as: CATAPRES - Dosed in mg/24 hr PLACE 1 PATCH (0.3 MG TOTAL) ONTO THE SKIN ONCE A WEEK. What changed: when to take this   desonide 0.05 % ointment Commonly known as: DESOWEN Apply 1 application topically 2 (two) times daily.   feeding supplement (OSMOLITE 1.2 CAL) Liqd Place 237 mLs into feeding tube 6 (six) times daily.   nutrition supplement (JUVEN) Pack Place 1 packet into feeding tube 2 (two) times daily between meals.   ferrous sulfate 300 (60 Fe) MG/5ML syrup Take 5 mLs (300 mg total) by mouth daily. Replaces: ferrous sulfate 325 (65 FE) MG tablet   free water Soln Place 100 mLs into feeding tube 6 (six) times daily.   furosemide 40 MG tablet Commonly known as: LASIX Place 1 tablet (40 mg total) into feeding tube daily. What changed: how to take this   gabapentin 300 MG capsule Commonly known as: NEURONTIN Place 1 capsule (300 mg total) into feeding tube 2 (two) times daily. What changed: See the new instructions.   glucose blood test strip Commonly known as: Accu-Chek Aviva Plus 1 each by Other route 2 (two) times daily. Use as instructed What changed: Another medication with the same name was removed. Continue taking this medication, and follow the directions you  see here.   Insulin Pen Needle 32G X 6 MM Misc Commonly known as: NovoFine Inject 1 application into the skin 4 (four) times daily.   medroxyPROGESTERone 10 MG tablet Commonly known as: PROVERA Take 2 tablets (20 mg total) by mouth daily.   nystatin cream Commonly known as: MYCOSTATIN Apply 1 application topically 2 (two) times daily.   pantoprazole sodium 40 mg Commonly known as: PROTONIX Place 40 mg into feeding tube daily.   potassium phosphate (monobasic) 500 MG tablet Commonly known as: K-PHOS ORIGINAL Place 1 tablet (500 mg total) into feeding tube daily.   pravastatin 40 MG tablet Commonly known as:  PRAVACHOL Place 1 tablet (40 mg total) into feeding tube daily. What changed: how to take this               Discharge Care Instructions  (From admission, onward)           Start     Ordered   06/02/21 0000  Discharge wound care:       Comments: Refer to wound care   06/02/21 0938            Follow-up Information     Tracie Harrier, MD Follow up in 1 week(s).   Specialty: Internal Medicine Contact information: [place] [address] [phone]                35 minutes Signed: Sharen Hones 06/02/2021, 9:38 AM

## 2021-06-02 NOTE — TOC Transition Note (Signed)
Transition of Care [facility]) - CM/SW Discharge Note   Patient Details  Name: Veronica Anderson MRN: [id number] Date of Birth: 1946-02-19  Transition of Care [facility] CM/SW Contact:  Harriet Masson, RN Phone Number:[phone] 06/02/2021, 4:10 PM   Clinical Narrative:    Spoke with son [name] who will be the primary caregiver for this pt when she returns home. Peg supplies have been provided along with education to the son in caring for this pt due to no accepting [place] agency to address the requested needs. Request EMS due to pt's immobility. [facility] called to transport pt to her home residence. No other needs at this time.  TOC will remain available for any additional needs.   Final next level of care: Home/Self Care Barriers to Discharge: Barriers Resolved   Patient Goals and CMS Choice        Discharge Placement                  Name of family member notified: Rainelle Sulewski [phone]) Patient and family notified of of transfer: 06/02/21  Discharge Plan and Services In-house Referral: Clinical Social Work   Post Acute Care Choice: [place]                               Social Determinants of Health (SDOH) Interventions     Readmission Risk Interventions No flowsheet data found.

## 2021-06-22 ENCOUNTER — Emergency Department: Payer: Self-pay

## 2021-06-22 ENCOUNTER — Emergency Department: Payer: Medicare Other

## 2021-06-22 ENCOUNTER — Inpatient Hospital Stay
Admission: EM | Admit: 2021-06-22 | Discharge: 2021-07-14 | DRG: 871 | Disposition: E | Payer: Medicare Other | Attending: Internal Medicine | Admitting: Internal Medicine

## 2021-06-22 ENCOUNTER — Encounter: Payer: Self-pay | Admitting: Emergency Medicine

## 2021-06-22 ENCOUNTER — Other Ambulatory Visit: Payer: Self-pay

## 2021-06-22 DIAGNOSIS — R6520 Severe sepsis without septic shock: Secondary | ICD-10-CM | POA: Diagnosis not present

## 2021-06-22 DIAGNOSIS — J189 Pneumonia, unspecified organism: Secondary | ICD-10-CM | POA: Diagnosis present

## 2021-06-22 DIAGNOSIS — I130 Hypertensive heart and chronic kidney disease with heart failure and stage 1 through stage 4 chronic kidney disease, or unspecified chronic kidney disease: Secondary | ICD-10-CM | POA: Diagnosis present

## 2021-06-22 DIAGNOSIS — G9341 Metabolic encephalopathy: Secondary | ICD-10-CM | POA: Diagnosis not present

## 2021-06-22 DIAGNOSIS — R6521 Severe sepsis with septic shock: Secondary | ICD-10-CM | POA: Diagnosis present

## 2021-06-22 DIAGNOSIS — Z515 Encounter for palliative care: Secondary | ICD-10-CM

## 2021-06-22 DIAGNOSIS — E1122 Type 2 diabetes mellitus with diabetic chronic kidney disease: Secondary | ICD-10-CM | POA: Diagnosis present

## 2021-06-22 DIAGNOSIS — J45909 Unspecified asthma, uncomplicated: Secondary | ICD-10-CM | POA: Diagnosis present

## 2021-06-22 DIAGNOSIS — K219 Gastro-esophageal reflux disease without esophagitis: Secondary | ICD-10-CM | POA: Diagnosis present

## 2021-06-22 DIAGNOSIS — E785 Hyperlipidemia, unspecified: Secondary | ICD-10-CM | POA: Diagnosis present

## 2021-06-22 DIAGNOSIS — E669 Obesity, unspecified: Secondary | ICD-10-CM | POA: Diagnosis present

## 2021-06-22 DIAGNOSIS — E440 Moderate protein-calorie malnutrition: Secondary | ICD-10-CM | POA: Diagnosis present

## 2021-06-22 DIAGNOSIS — J690 Pneumonitis due to inhalation of food and vomit: Secondary | ICD-10-CM

## 2021-06-22 DIAGNOSIS — Z823 Family history of stroke: Secondary | ICD-10-CM

## 2021-06-22 DIAGNOSIS — D631 Anemia in chronic kidney disease: Secondary | ICD-10-CM | POA: Diagnosis present

## 2021-06-22 DIAGNOSIS — Z79899 Other long term (current) drug therapy: Secondary | ICD-10-CM

## 2021-06-22 DIAGNOSIS — G928 Other toxic encephalopathy: Secondary | ICD-10-CM | POA: Diagnosis present

## 2021-06-22 DIAGNOSIS — J9621 Acute and chronic respiratory failure with hypoxia: Secondary | ICD-10-CM | POA: Diagnosis present

## 2021-06-22 DIAGNOSIS — Z9989 Dependence on other enabling machines and devices: Secondary | ICD-10-CM | POA: Diagnosis not present

## 2021-06-22 DIAGNOSIS — Z7401 Bed confinement status: Secondary | ICD-10-CM

## 2021-06-22 DIAGNOSIS — Z794 Long term (current) use of insulin: Secondary | ICD-10-CM

## 2021-06-22 DIAGNOSIS — N184 Chronic kidney disease, stage 4 (severe): Secondary | ICD-10-CM | POA: Diagnosis present

## 2021-06-22 DIAGNOSIS — J9622 Acute and chronic respiratory failure with hypercapnia: Secondary | ICD-10-CM | POA: Diagnosis present

## 2021-06-22 DIAGNOSIS — I5031 Acute diastolic (congestive) heart failure: Secondary | ICD-10-CM | POA: Diagnosis not present

## 2021-06-22 DIAGNOSIS — I2510 Atherosclerotic heart disease of native coronary artery without angina pectoris: Secondary | ICD-10-CM | POA: Diagnosis present

## 2021-06-22 DIAGNOSIS — Z833 Family history of diabetes mellitus: Secondary | ICD-10-CM

## 2021-06-22 DIAGNOSIS — Z20822 Contact with and (suspected) exposure to covid-19: Secondary | ICD-10-CM | POA: Diagnosis present

## 2021-06-22 DIAGNOSIS — E1165 Type 2 diabetes mellitus with hyperglycemia: Secondary | ICD-10-CM | POA: Diagnosis present

## 2021-06-22 DIAGNOSIS — I252 Old myocardial infarction: Secondary | ICD-10-CM

## 2021-06-22 DIAGNOSIS — E559 Vitamin D deficiency, unspecified: Secondary | ICD-10-CM | POA: Diagnosis present

## 2021-06-22 DIAGNOSIS — E1142 Type 2 diabetes mellitus with diabetic polyneuropathy: Secondary | ICD-10-CM | POA: Diagnosis present

## 2021-06-22 DIAGNOSIS — I10 Essential (primary) hypertension: Secondary | ICD-10-CM | POA: Diagnosis not present

## 2021-06-22 DIAGNOSIS — Z8673 Personal history of transient ischemic attack (TIA), and cerebral infarction without residual deficits: Secondary | ICD-10-CM

## 2021-06-22 DIAGNOSIS — G4733 Obstructive sleep apnea (adult) (pediatric): Secondary | ICD-10-CM | POA: Diagnosis present

## 2021-06-22 DIAGNOSIS — N179 Acute kidney failure, unspecified: Secondary | ICD-10-CM | POA: Diagnosis not present

## 2021-06-22 DIAGNOSIS — J9601 Acute respiratory failure with hypoxia: Secondary | ICD-10-CM | POA: Diagnosis not present

## 2021-06-22 DIAGNOSIS — R509 Fever, unspecified: Secondary | ICD-10-CM

## 2021-06-22 DIAGNOSIS — A419 Sepsis, unspecified organism: Principal | ICD-10-CM | POA: Diagnosis present

## 2021-06-22 DIAGNOSIS — Z931 Gastrostomy status: Secondary | ICD-10-CM

## 2021-06-22 DIAGNOSIS — R4182 Altered mental status, unspecified: Secondary | ICD-10-CM | POA: Diagnosis present

## 2021-06-22 DIAGNOSIS — I5032 Chronic diastolic (congestive) heart failure: Secondary | ICD-10-CM | POA: Diagnosis present

## 2021-06-22 DIAGNOSIS — Z7189 Other specified counseling: Secondary | ICD-10-CM | POA: Diagnosis not present

## 2021-06-22 DIAGNOSIS — Z79890 Hormone replacement therapy (postmenopausal): Secondary | ICD-10-CM

## 2021-06-22 DIAGNOSIS — Z888 Allergy status to other drugs, medicaments and biological substances status: Secondary | ICD-10-CM

## 2021-06-22 DIAGNOSIS — R5381 Other malaise: Secondary | ICD-10-CM | POA: Diagnosis not present

## 2021-06-22 DIAGNOSIS — E875 Hyperkalemia: Secondary | ICD-10-CM | POA: Diagnosis not present

## 2021-06-22 DIAGNOSIS — Z87891 Personal history of nicotine dependence: Secondary | ICD-10-CM

## 2021-06-22 DIAGNOSIS — E119 Type 2 diabetes mellitus without complications: Secondary | ICD-10-CM | POA: Diagnosis not present

## 2021-06-22 LAB — COMPREHENSIVE METABOLIC PANEL
ALT: 10 U/L (ref 0–44)
AST: 18 U/L (ref 15–41)
Albumin: 2.8 g/dL — ABNORMAL LOW (ref 3.5–5.0)
Alkaline Phosphatase: 75 U/L (ref 38–126)
Anion gap: 13 (ref 5–15)
BUN: 50 mg/dL — ABNORMAL HIGH (ref 8–23)
CO2: 25 mmol/L (ref 22–32)
Calcium: 9.1 mg/dL (ref 8.9–10.3)
Chloride: 100 mmol/L (ref 98–111)
Creatinine, Ser: 1.22 mg/dL — ABNORMAL HIGH (ref 0.44–1.00)
GFR, Estimated: 46 mL/min — ABNORMAL LOW (ref >60)
Glucose, Bld: 384 mg/dL — ABNORMAL HIGH (ref 70–99)
Potassium: 6.5 mmol/L (ref 3.5–5.1)
Sodium: 138 mmol/L (ref 135–145)
Total Bilirubin: 1 mg/dL (ref 0.3–1.2)
Total Protein: 7.5 g/dL (ref 6.5–8.1)

## 2021-06-22 LAB — CBC WITH DIFFERENTIAL/PLATELET
Abs Immature Granulocytes: 0.35 10*3/uL — ABNORMAL HIGH (ref 0.00–0.07)
Basophils Absolute: 0.2 10*3/uL — ABNORMAL HIGH (ref 0.0–0.1)
Basophils Relative: 0 %
Eosinophils Absolute: 0 10*3/uL (ref 0.0–0.5)
Eosinophils Relative: 0 %
HCT: 34.5 % — ABNORMAL LOW (ref 36.0–46.0)
Hemoglobin: 10.3 g/dL — ABNORMAL LOW (ref 12.0–15.0)
Immature Granulocytes: 1 %
Lymphocytes Relative: 6 %
Lymphs Abs: 1.9 10*3/uL (ref 0.7–4.0)
MCH: 25.8 pg — ABNORMAL LOW (ref 26.0–34.0)
MCHC: 29.9 g/dL — ABNORMAL LOW (ref 30.0–36.0)
MCV: 86.3 fL (ref 80.0–100.0)
Monocytes Absolute: 2.3 10*3/uL — ABNORMAL HIGH (ref 0.1–1.0)
Monocytes Relative: 7 %
Neutro Abs: 29 10*3/uL — ABNORMAL HIGH (ref 1.7–7.7)
Neutrophils Relative %: 86 %
Platelets: 415 10*3/uL — ABNORMAL HIGH (ref 150–400)
RBC: 4 MIL/uL (ref 3.87–5.11)
RDW: 18.5 % — ABNORMAL HIGH (ref 11.5–15.5)
Smear Review: NORMAL
WBC: 33.7 10*3/uL — ABNORMAL HIGH (ref 4.0–10.5)
nRBC: 0 % (ref 0.0–0.2)

## 2021-06-22 LAB — LACTIC ACID, PLASMA
Lactic Acid, Venous: 3.4 mmol/L (ref 0.5–1.9)
Lactic Acid, Venous: 4.2 mmol/L (ref 0.5–1.9)
Lactic Acid, Venous: 3.7 mmol/L (ref 0.5–1.9)

## 2021-06-22 LAB — BLOOD GAS, VENOUS
Acid-Base Excess: 4.2 mmol/L — ABNORMAL HIGH (ref 0.0–2.0)
Bicarbonate: 29.7 mmol/L — ABNORMAL HIGH (ref 20.0–28.0)
O2 Saturation: 92.4 %
Patient temperature: 37
pCO2, Ven: 48 mmHg (ref 44.0–60.0)
pH, Ven: 7.4 (ref 7.250–7.430)
pO2, Ven: 65 mmHg — ABNORMAL HIGH (ref 32.0–45.0)

## 2021-06-22 LAB — BASIC METABOLIC PANEL
Anion gap: 13 (ref 5–15)
BUN: 48 mg/dL — ABNORMAL HIGH (ref 8–23)
CO2: 22 mmol/L (ref 22–32)
Calcium: 8.5 mg/dL — ABNORMAL LOW (ref 8.9–10.3)
Chloride: 102 mmol/L (ref 98–111)
Creatinine, Ser: 1.1 mg/dL — ABNORMAL HIGH (ref 0.44–1.00)
GFR, Estimated: 52 mL/min — ABNORMAL LOW (ref >60)
Glucose, Bld: 432 mg/dL — ABNORMAL HIGH (ref 70–99)
Potassium: 5.3 mmol/L — ABNORMAL HIGH (ref 3.5–5.1)
Sodium: 137 mmol/L (ref 135–145)

## 2021-06-22 LAB — TROPONIN I (HIGH SENSITIVITY)
Troponin I (High Sensitivity): 23 ng/L — ABNORMAL HIGH (ref <18)
Troponin I (High Sensitivity): 23 ng/L — ABNORMAL HIGH (ref <18)

## 2021-06-22 LAB — TSH: TSH: 2.06 u[IU]/mL (ref 0.350–4.500)

## 2021-06-22 LAB — BRAIN NATRIURETIC PEPTIDE: B Natriuretic Peptide: 800.2 pg/mL — ABNORMAL HIGH (ref 0.0–100.0)

## 2021-06-22 LAB — BETA-HYDROXYBUTYRIC ACID: Beta-Hydroxybutyric Acid: 0.31 mmol/L — ABNORMAL HIGH (ref 0.05–0.27)

## 2021-06-22 LAB — CBG MONITORING, ED
Glucose-Capillary: 383 mg/dL — ABNORMAL HIGH (ref 70–99)
Glucose-Capillary: 402 mg/dL — ABNORMAL HIGH (ref 70–99)
Glucose-Capillary: 426 mg/dL — ABNORMAL HIGH (ref 70–99)

## 2021-06-22 LAB — T4, FREE: Free T4: 1.24 ng/dL — ABNORMAL HIGH (ref 0.61–1.12)

## 2021-06-22 LAB — PHOSPHORUS: Phosphorus: 4.1 mg/dL (ref 2.5–4.6)

## 2021-06-22 LAB — RESP PANEL BY RT-PCR (FLU A&B, COVID) ARPGX2
Influenza A by PCR: NEGATIVE
Influenza B by PCR: NEGATIVE
SARS Coronavirus 2 by RT PCR: NEGATIVE

## 2021-06-22 LAB — MAGNESIUM: Magnesium: 2.1 mg/dL (ref 1.7–2.4)

## 2021-06-22 LAB — AMMONIA: Ammonia: 15 umol/L (ref 9–35)

## 2021-06-22 LAB — MRSA NEXT GEN BY PCR, NASAL: MRSA by PCR Next Gen: DETECTED — AB

## 2021-06-22 LAB — CK: Total CK: 27 U/L — ABNORMAL LOW (ref 38–234)

## 2021-06-22 MED ORDER — ONDANSETRON HCL 4 MG/2ML IJ SOLN: 4.0000 mg | Freq: Four times a day (QID) | INTRAMUSCULAR | Status: DC | PRN

## 2021-06-22 MED ORDER — INSULIN ASPART 100 UNIT/ML IJ SOLN
0.0000 [IU] | Freq: Three times a day (TID) | INTRAMUSCULAR | Status: DC
Start: 2021-06-22 — End: 2021-06-22
  Administered 2021-06-22: 20 [IU] via SUBCUTANEOUS
  Filled 2021-06-22: qty 1

## 2021-06-22 MED ORDER — PANTOPRAZOLE SODIUM 40 MG PO PACK: 40.0000 mg | PACK | Freq: Every day | ORAL | Status: DC

## 2021-06-22 MED ORDER — LACTATED RINGERS IV BOLUS (SEPSIS)
1000.0000 mL | Freq: Once | INTRAVENOUS | Status: AC
  Administered 2021-06-22: 1000 mL via INTRAVENOUS

## 2021-06-22 MED ORDER — TRAZODONE HCL 50 MG PO TABS: 25.0000 mg | ORAL_TABLET | Freq: Every evening | ORAL | Status: DC | PRN

## 2021-06-22 MED ORDER — JUVEN PO PACK
1.0000 | PACK | Freq: Two times a day (BID) | ORAL | Status: DC
  Administered 2021-06-23 – 2021-06-25 (×6): 1

## 2021-06-22 MED ORDER — INSULIN ASPART 100 UNIT/ML IJ SOLN
24.0000 [IU] | Freq: Once | INTRAMUSCULAR | Status: AC
  Administered 2021-06-22: 24 [IU] via SUBCUTANEOUS
  Filled 2021-06-22: qty 1

## 2021-06-22 MED ORDER — OSMOLITE 1.2 CAL PO LIQD
237.0000 mL | Freq: Every day | ORAL | Status: DC
  Administered 2021-06-22 – 2021-06-24 (×9): 237 mL

## 2021-06-22 MED ORDER — ENOXAPARIN SODIUM 40 MG/0.4ML IJ SOSY
40.0000 mg | PREFILLED_SYRINGE | INTRAMUSCULAR | Status: DC
  Administered 2021-06-22 – 2021-06-24 (×3): 40 mg via SUBCUTANEOUS
  Filled 2021-06-22 (×3): qty 0.4

## 2021-06-22 MED ORDER — FERROUS SULFATE 300 (60 FE) MG/5ML PO SYRP
300.0000 mg | ORAL_SOLUTION | Freq: Every day | ORAL | Status: DC
  Filled 2021-06-22: qty 5

## 2021-06-22 MED ORDER — SODIUM CHLORIDE 0.9 % IV SOLN
2.0000 g | Freq: Two times a day (BID) | INTRAVENOUS | Status: DC
  Administered 2021-06-22 – 2021-06-23 (×3): 2 g via INTRAVENOUS
  Filled 2021-06-22 (×4): qty 2

## 2021-06-22 MED ORDER — VANCOMYCIN HCL IN DEXTROSE 1-5 GM/200ML-% IV SOLN
1000.0000 mg | Freq: Once | INTRAVENOUS | Status: AC
  Administered 2021-06-22: 1000 mg via INTRAVENOUS
  Filled 2021-06-22: qty 200

## 2021-06-22 MED ORDER — MAGNESIUM HYDROXIDE 400 MG/5ML PO SUSP: 30.0000 mL | Freq: Every day | ORAL | Status: DC | PRN

## 2021-06-22 MED ORDER — VANCOMYCIN HCL 750 MG/150ML IV SOLN: 750.0000 mg | INTRAVENOUS | Status: DC

## 2021-06-22 MED ORDER — POTASSIUM PHOSPHATE MONOBASIC 500 MG PO TABS: 500.0000 mg | ORAL_TABLET | Freq: Every day | ORAL | Status: DC

## 2021-06-22 MED ORDER — METRONIDAZOLE 500 MG/100ML IV SOLN
500.0000 mg | Freq: Two times a day (BID) | INTRAVENOUS | Status: DC
  Administered 2021-06-23 (×3): 500 mg via INTRAVENOUS
  Filled 2021-06-22 (×4): qty 100

## 2021-06-22 MED ORDER — CALCIUM GLUCONATE-NACL 1-0.675 GM/50ML-% IV SOLN: 1.0000 g | Freq: Once | INTRAVENOUS | Status: DC

## 2021-06-22 MED ORDER — INSULIN ASPART 100 UNIT/ML IJ SOLN
0.0000 [IU] | INTRAMUSCULAR | Status: DC
  Administered 2021-06-23: 20 [IU] via SUBCUTANEOUS
  Administered 2021-06-23: 7 [IU] via SUBCUTANEOUS
  Administered 2021-06-23: 11 [IU] via SUBCUTANEOUS
  Administered 2021-06-24: 15 [IU] via SUBCUTANEOUS
  Administered 2021-06-24 (×2): 7 [IU] via SUBCUTANEOUS
  Administered 2021-06-24: 20 [IU] via SUBCUTANEOUS
  Administered 2021-06-24: 11 [IU] via SUBCUTANEOUS
  Administered 2021-06-25: 3 [IU] via SUBCUTANEOUS
  Administered 2021-06-25: 15 [IU] via SUBCUTANEOUS
  Administered 2021-06-25: 4 [IU] via SUBCUTANEOUS
  Administered 2021-06-25: 2 [IU] via SUBCUTANEOUS
  Filled 2021-06-22 (×13): qty 1

## 2021-06-22 MED ORDER — PANTOPRAZOLE 2 MG/ML SUSPENSION
40.0000 mg | Freq: Every day | ORAL | Status: DC
  Administered 2021-06-22 – 2021-06-25 (×4): 40 mg
  Filled 2021-06-22 (×6): qty 20

## 2021-06-22 MED ORDER — CLONIDINE HCL 0.3 MG/24HR TD PTWK
0.3000 mg | MEDICATED_PATCH | TRANSDERMAL | Status: DC
  Administered 2021-06-23: 0.3 mg via TRANSDERMAL
  Filled 2021-06-22: qty 1

## 2021-06-22 MED ORDER — OXYCODONE HCL 5 MG/5ML PO SOLN
5.0000 mg | Freq: Four times a day (QID) | ORAL | Status: DC | PRN
  Administered 2021-06-22: 5 mg
  Filled 2021-06-22 (×4): qty 5

## 2021-06-22 MED ORDER — INSULIN ASPART 100 UNIT/ML IV SOLN
5.0000 [IU] | Freq: Once | INTRAVENOUS | Status: AC
  Administered 2021-06-22: 5 [IU] via INTRAVENOUS
  Filled 2021-06-22 (×2): qty 0.05

## 2021-06-22 MED ORDER — METOCLOPRAMIDE HCL 5 MG/ML IJ SOLN: 10.0000 mg | Freq: Three times a day (TID) | INTRAMUSCULAR | Status: DC | PRN

## 2021-06-22 MED ORDER — SODIUM CHLORIDE 0.9 % IV SOLN: 2.0000 g | Freq: Once | INTRAVENOUS | Status: DC

## 2021-06-22 MED ORDER — ACETAMINOPHEN 325 MG PO TABS: 650.0000 mg | ORAL_TABLET | Freq: Four times a day (QID) | ORAL | Status: DC | PRN

## 2021-06-22 MED ORDER — SODIUM BICARBONATE 8.4 % IV SOLN
50.0000 meq | Freq: Once | INTRAVENOUS | Status: AC
  Administered 2021-06-22: 50 meq via INTRAVENOUS
  Filled 2021-06-22: qty 50

## 2021-06-22 MED ORDER — SODIUM CHLORIDE 0.9 % IV SOLN
2.0000 g | Freq: Once | INTRAVENOUS | Status: AC
  Administered 2021-06-22: 2 g via INTRAVENOUS
  Filled 2021-06-22: qty 2

## 2021-06-22 MED ORDER — GABAPENTIN 300 MG PO CAPS
300.0000 mg | ORAL_CAPSULE | Freq: Two times a day (BID) | ORAL | Status: DC
  Administered 2021-06-22 – 2021-06-25 (×7): 300 mg
  Filled 2021-06-22 (×7): qty 1

## 2021-06-22 MED ORDER — SODIUM CHLORIDE 0.9 % IV BOLUS
500.0000 mL | Freq: Once | INTRAVENOUS | Status: AC
  Administered 2021-06-22: 500 mL via INTRAVENOUS

## 2021-06-22 MED ORDER — ACETAMINOPHEN 650 MG RE SUPP: 650.0000 mg | Freq: Four times a day (QID) | RECTAL | Status: DC | PRN

## 2021-06-22 MED ORDER — FERROUS SULFATE 220 (44 FE) MG/5ML PO ELIX
300.0000 mg | ORAL_SOLUTION | Freq: Every day | ORAL | Status: DC
  Administered 2021-06-23 – 2021-06-25 (×3): 300 mg
  Filled 2021-06-22 (×3): qty 6.82

## 2021-06-22 MED ORDER — INSULIN ASPART 100 UNIT/ML IJ SOLN: 0.0000 [IU] | INTRAMUSCULAR | Status: DC

## 2021-06-22 MED ORDER — ACETAMINOPHEN 160 MG/5ML PO SOLN
1000.0000 mg | Freq: Once | ORAL | Status: AC
  Administered 2021-06-22: 1000 mg
  Filled 2021-06-22: qty 40.6

## 2021-06-22 MED ORDER — PRAVASTATIN SODIUM 20 MG PO TABS
40.0000 mg | ORAL_TABLET | Freq: Every evening | ORAL | Status: DC
  Administered 2021-06-22 – 2021-06-25 (×4): 40 mg
  Filled 2021-06-22 (×4): qty 2

## 2021-06-22 MED ORDER — ONDANSETRON HCL 4 MG PO TABS: 4.0000 mg | ORAL_TABLET | Freq: Four times a day (QID) | ORAL | Status: DC | PRN

## 2021-06-22 MED ORDER — METRONIDAZOLE 500 MG/100ML IV SOLN
500.0000 mg | Freq: Once | INTRAVENOUS | Status: AC
  Administered 2021-06-22: 500 mg via INTRAVENOUS
  Filled 2021-06-22: qty 100

## 2021-06-22 NOTE — ED Notes (Signed)
Mansy, MD sent message regarding patient's cbg of 402.

## 2021-06-22 NOTE — ED Notes (Signed)
IV team at bedside 

## 2021-06-22 NOTE — ED Notes (Signed)
Skin assessment: stage 1 pressure ulcer on bottom- pink in color. Gregary Signs, RN with this RN on assessment.

## 2021-06-22 NOTE — ED Notes (Signed)
Multiple attempts for IV's by this RN, Maggie RN, Gregary Signs RN, and Jari Pigg MD.

## 2021-06-22 NOTE — ED Notes (Addendum)
Per PICC team, patient won't be able to PICC line till tomorrow 12/11 at the earliest. Per team, they will not able to place line in ED if patient is still in ED due to problems with sterility.

## 2021-06-22 NOTE — Sepsis Progress Note (Signed)
Elink following code sepsis °

## 2021-06-22 NOTE — Consult Note (Signed)
Pharmacy Antibiotic Note  Veronica Anderson is a 75 y.o. female admitted on 06/25/2021 with sepsis.  Pharmacy has been consulted for Vancomycin and Cefepime dosing.  Plan: 1) Patient received Vancomycin 1g IV in ED. Vancomycin 750 mg IV Q 24 hrs. Goal AUC 400-550. Expected AUC: 462 Expected Css: 12.6 SCr used: 1.1  2) Cefepime 2g IV Q12 hours   Height: 5\' 4"  (162.6 cm) Weight: 90 kg (198 lb 6.6 oz) IBW/kg (Calculated) : 54.7  Temp (24hrs), Avg:99.8 F (37.7 C), Min:99 F (37.2 C), Max:100.6 F (38.1 C)  Recent Labs  Lab 06/28/2021 1003 07/12/2021 1320  WBC 33.7*  --   CREATININE 1.22* 1.10*  LATICACIDVEN 3.4* 4.2*    Estimated Creatinine Clearance: 48 mL/min (A) (by C-G formula based on SCr of 1.1 mg/dL (H)).    Allergies  Allergen Reactions   Ace Inhibitors Cough    Antimicrobials this admission: Vancomycin 12/10 >>  Cefepime 12/10 >>   Microbiology results: 12/10 BCx: pending 12/10 MRSA PCR: pending  Thank you for allowing pharmacy to be a part of this patient's care.  Pearla Dubonnet 07/04/2021 2:59 PM

## 2021-06-22 NOTE — ED Notes (Signed)
NP messaged about pt o2 sats and increased o2 needs. At this time

## 2021-06-22 NOTE — ED Provider Notes (Signed)
Angiocath insertion  Date/Time: 06/27/2021 10:49 PM Performed by: Carrie Mew, MD Authorized by: Carrie Mew, MD  Consent: The procedure was performed in an emergent situation. Patient identity confirmed: arm band Preparation: Patient was prepped and draped in the usual sterile fashion. Local anesthesia used: no  Anesthesia: Local anesthesia used: no  Sedation: Patient sedated: no  Patient tolerance: patient tolerated the procedure well with no immediate complications Comments: Continuous [place] vis. 1 attempt, R Fawn Kirk, MD 06/25/2021 2250

## 2021-06-22 NOTE — ED Notes (Signed)
Called lab to collect lactic acid lvl that is due

## 2021-06-22 NOTE — ED Notes (Signed)
Messaged provider about pt cbg

## 2021-06-22 NOTE — ED Provider Notes (Signed)
Procedures     ----------------------------------------- 5:34 PM on 06/18/2021 ----------------------------------------- Sepsis reassessment completed.  Third lactate is downtrending and less than 4.  Blood pressure has remained normal.  Vasopressors not indicated.  Further care by hospitalist team.     Carrie Mew, MD 06/21/2021 [phone]

## 2021-06-22 NOTE — ED Notes (Signed)
Called RN [facility] for report. RN stating that they are not assigned that bed and will call back when the room is assigned to a nurse

## 2021-06-22 NOTE — ED Notes (Signed)
Pharmacy called for IV insulin that was due at 1130

## 2021-06-22 NOTE — ED Notes (Signed)
Pt bedding changed and pure wick placed. Pt had small bm and urine in brief

## 2021-06-22 NOTE — ED Notes (Signed)
US at bedside

## 2021-06-22 NOTE — ED Notes (Signed)
PRN pain med requested from [place]

## 2021-06-22 NOTE — ED Notes (Signed)
Mansy, MD at bedside.

## 2021-06-22 NOTE — ED Notes (Signed)
RT called for low O2 stats.

## 2021-06-22 NOTE — Progress Notes (Addendum)
Spoke with the ED primary RN caring for this patient. This pt currently has PIV access for antibiotic infusion, is currently stable, and a second IVT nurse will reassess for a possible second IV site. Will reassess the patient in the AM for PICC placement.

## 2021-06-22 NOTE — ED Notes (Addendum)
Unable to flush pt IV md aware: messaged provider: the patients one IV is not flushing and pt endorsing pain when I attempt to flush. she has a picc ordered for tomorrow, bt we have no other access and IV team has also failed to place a line aswell. pt has multiple IV abx due over the next few hours. please advise.

## 2021-06-22 NOTE — ED Notes (Signed)
Pt endorsing bilat arm pain

## 2021-06-22 NOTE — H&P (Addendum)
[place]   PATIENT NAME: Veronica Anderson    MR#:  [id number]  DATE OF BIRTH:  15-Oct-1945  DATE OF ADMISSION:  07/01/2021  PRIMARY CARE PHYSICIAN: Tracie Harrier, MD   Patient is coming from: Home  REQUESTING/REFERRING PHYSICIAN: Marjean Donna, MD  CHIEF COMPLAINT:   Chief Complaint  Patient presents with   Code Sepsis    HISTORY OF PRESENT ILLNESS:  Carrin Vannostrand is a 75 y.o. African-American female with medical history significant for CHF, stage IV chronic kidney disease, type diabetes mellitus, hypertension, dyslipidemia and peripheral neuropathy as well as sleep apnea on home CPAP though an old CPAP machine, presented to the emergency room with acute onset of altered mental status since yesterday with associated cough productive of clear sputum as well as dyspnea with occasional wheezing.  She admits to chills without measured fever.  No nausea or vomiting or diarrhea.  No chest pain or palpitations.  No dysuria, oliguria or urinary frequency or urgency or flank pain.  Oximetry was 84% on room air and 94% on 50% FiO2 on Ventimask and was later placed on high flow nasal cannula.  The patient was recently admitted here for aspiration pneumonia and sepsis and discharged on 06/02/2021.  ED Course: When she came to the ER, temperature was 100.6/38.1 with a blood pressure of 91/54 with heart rate of 103 and respiratory to 24.  Labs revealed potassium of 5.3 and glucose of 432 with a BUN of 48 and creatinine 1.1 and calcium 8.5.  Lactic acid was 4.2 and high-sensitivity troponin I was 23.  CBC showed significant cytosis of 33.7 with neutrophilia and macrocytosis as well as anemia.  Influenza antigens and COVID-19 PCR came back negative.  Blood cultures were drawn. EKG as reviewed by me : Pending. Imaging: Chest x-ray showed persistent right basilar more opacity suspicious for pneumonia.  Right upper quadrant ultrasound revealed cholelithiasis without evidence of cholecystitis.  The  patient was given 1 g of p.o. Tylenol, 5 units of subcu NovoLog and 1 ampoule of sodium bicarbonate as well as 2 L bolus of IV lactated Ringer and 1 L bolus of IV normal saline.  She was started on broad-spectrum IV antibiotics with vancomycin, cefepime and Flagyl.  She will be admitted to a stepdown unit bed for further evaluation and management. PAST MEDICAL HISTORY:   Past Medical History:  Diagnosis Date   Allergy    Anemia    Asthma    Cataract    bilateral   CHF (NYHA class II, ACC/AHA stage C) (HCC)    Chronic kidney disease    stage IV (severe), Dr. Aleene Davidson   Constipation    Diabetes mellitus without complication ([place])    Edema    feet/ankles   GERD (gastroesophageal reflux disease)    Hyperlipidemia    Hypertension    Left ankle pain    Lumbago    Myocardial infarction (HCC)    Neuropathy    Neuropathy    OA (osteoarthritis)    Obesity    Obstructive sleep apnea syndrome    CPAP   Paresthesia    Foot   Protein calorie malnutrition (HCC)    Requires supplemental oxygen    Stroke ([place])    Vitamin D deficiency     PAST SURGICAL HISTORY:   Past Surgical History:  Procedure Laterality Date   ANKLE SURGERY Left    CATARACT EXTRACTION W/PHACO Right 12/20/2015   Procedure: CATARACT EXTRACTION PHACO AND INTRAOCULAR LENS PLACEMENT ([place]);  Surgeon: Eulogio Bear, MD;  Location: [facility] ORS;  Service: Ophthalmology;  Laterality: Right;  [place] 2.50 AP% 21.8 CDE 37.04 Fluid pack lot # [id number]   CATARACT EXTRACTION W/PHACO Left 02/14/2016   Procedure: CATARACT EXTRACTION PHACO AND INTRAOCULAR LENS PLACEMENT ([facility]);  Surgeon: Eulogio Bear, MD;  Location: [facility] ORS;  Service: Ophthalmology;  Laterality: Left;  [place] 3.12 AP% 42.1 CDE 47.12 Fluid Pack lot # [id number]   COLONOSCOPY     FRACTURE SURGERY Left    ankle   IR GASTROSTOMY TUBE MOD SED  05/30/2021    SOCIAL HISTORY:   Social History   Tobacco Use   Smoking status: Former    Types: Cigarettes    Quit date:  07/14/1994    Years since quitting: 26.9   Smokeless tobacco: Never  Substance Use Topics   Alcohol use: No    Alcohol/week: 0.0 standard drinks    FAMILY HISTORY:   Family History  Problem Relation Age of Onset   Diabetes type II Other     DRUG ALLERGIES:   Allergies  Allergen Reactions   Ace Inhibitors Cough    REVIEW OF SYSTEMS:   ROS As per history of present illness. All pertinent systems were reviewed above. Constitutional, HEENT, cardiovascular, respiratory, GI, GU, musculoskeletal, neuro, psychiatric, endocrine, integumentary and hematologic systems were reviewed and are otherwise negative/unremarkable except for positive findings mentioned above in the HPI.   MEDICATIONS AT HOME:   Prior to Admission medications   Medication Sig Start Date End Date Taking? Authorizing Provider  cloNIDine (CATAPRES - DOSED IN MG/24 HR) 0.3 mg/24hr patch PLACE 1 PATCH (0.3 MG TOTAL) ONTO THE SKIN ONCE A WEEK. Patient taking differently: Place 0.3 mg onto the skin every Sunday. 01/28/18  Yes Sowles, Drue Stager, MD  ferrous sulfate 300 (60 Fe) MG/5ML syrup Take 5 mLs (300 mg total) by mouth daily. 06/02/21 07/02/21 Yes Sharen Hones, MD  furosemide (LASIX) 40 MG tablet Place 1 tablet (40 mg total) into feeding tube daily. 06/02/21  Yes Sharen Hones, MD  gabapentin (NEURONTIN) 300 MG capsule Place 1 capsule (300 mg total) into feeding tube 2 (two) times daily. 06/02/21  Yes Sharen Hones, MD  medroxyPROGESTERone (PROVERA) 10 MG tablet Take 2 tablets (20 mg total) by mouth daily. 11/18/17  Yes Gae Dry, MD  Nutritional Supplements (FEEDING SUPPLEMENT, OSMOLITE 1.2 CAL,) LIQD Place 237 mLs into feeding tube 6 (six) times daily. 06/02/21  Yes Sharen Hones, MD  oxyCODONE (ROXICODONE) 5 MG/5ML solution Place 5 mLs (5 mg total) into feeding tube every 6 (six) hours as needed for moderate pain. 06/02/21  Yes Sharen Hones, MD  pravastatin (PRAVACHOL) 40 MG tablet Place 1 tablet (40 mg total) into  feeding tube daily. 06/02/21  Yes Sharen Hones, MD  desonide (DESOWEN) 0.05 % ointment Apply 1 application topically 2 (two) times daily. 10/23/17   Poulose, Bethel Born, NP  glucose blood (ACCU-CHEK AVIVA PLUS) test strip 1 each by Other route 2 (two) times daily. Use as instructed 08/06/17   Steele Sizer, MD  Insulin Pen Needle (NOVOFINE) 32G X 6 MM MISC Inject 1 application into the skin 4 (four) times daily. 10/23/17   Poulose, Bethel Born, NP  nutrition supplement, JUVEN, (JUVEN) PACK Place 1 packet into feeding tube 2 (two) times daily between meals. Patient not taking: Reported on 06/20/2021 06/02/21   Sharen Hones, MD  nystatin cream (MYCOSTATIN) Apply 1 application topically 2 (two) times daily. 05/09/21   [provider]  pantoprazole sodium (PROTONIX)  40 mg Place 40 mg into feeding tube daily. Patient not taking: Reported on 07/07/2021 06/02/21   Sharen Hones, MD  potassium phosphate, monobasic, (K-PHOS ORIGINAL) 500 MG tablet Place 1 tablet (500 mg total) into feeding tube daily. Patient not taking: Reported on 07/09/2021 06/02/21   Sharen Hones, MD  Water For Irrigation, Sterile (FREE WATER) SOLN Place 100 mLs into feeding tube 6 (six) times daily. 06/02/21   Sharen Hones, MD      VITAL SIGNS:  Blood pressure 109/66, pulse 96, temperature (!) 100.6 F (38.1 C), temperature source Rectal, resp. rate (!) 21, height 5\' 4"  (1.626 m), weight 90 kg, SpO2 97 %.  PHYSICAL EXAMINATION:  Physical Exam  GENERAL:  75 y.o.-year-old African-American female patient lying in the bed with mild respiratory distress with conversational dyspnea.  She was on O2 Ventimask then.   EYES: Pupils equal, round, reactive to light and accommodation. No scleral icterus. Extraocular muscles intact.  HEENT: Head atraumatic, normocephalic. Oropharynx and nasopharynx clear.  NECK:  Supple, no jugular venous distention. No thyroid enlargement, no tenderness.  LUNGS: Diminished bibasilar breath sounds  with right basal crackles. CARDIOVASCULAR: Regular rate and rhythm, S1, S2 normal. No murmurs, rubs, or gallops.  ABDOMEN: Soft, nondistended, nontender. Bowel sounds present. No organomegaly or mass.  EXTREMITIES: No pedal edema, cyanosis, or clubbing.  NEUROLOGIC: Cranial nerves II through XII are intact. Muscle strength 5/5 in all extremities. Sensation intact. Gait not checked.  PSYCHIATRIC: The patient is alert and oriented x 3.  Normal affect and good eye contact. SKIN: No obvious rash, lesion, or ulcer.   LABORATORY PANEL:   CBC Recent Labs  Lab 06/27/2021 1003  WBC 33.7*  HGB 10.3*  HCT 34.5*  PLT 415*   ------------------------------------------------------------------------------------------------------------------  Chemistries  Recent Labs  Lab 07/13/2021 1003 07/04/2021 1320  NA 138 137  K 6.5* 5.3*  CL 100 102  CO2 25 22  GLUCOSE 384* 432*  BUN 50* 48*  CREATININE 1.22* 1.10*  CALCIUM 9.1 8.5*  MG 2.1  --   AST 18  --   ALT 10  --   ALKPHOS 75  --   BILITOT 1.0  --    ------------------------------------------------------------------------------------------------------------------  Cardiac Enzymes No results for input(s): TROPONINI in the last 168 hours. ------------------------------------------------------------------------------------------------------------------  RADIOLOGY:  CT HEAD WO CONTRAST (5MM)  Result Date: 06/17/2021 CLINICAL DATA:  Head trauma.  Sepsis.  Altered mental status. EXAM: CT HEAD WITHOUT CONTRAST TECHNIQUE: Contiguous axial images were obtained from the base of the skull through the vertex without intravenous contrast. COMPARISON:  05/25/2021 FINDINGS: Brain: No evidence of acute infarction, hemorrhage, hydrocephalus, extra-axial collection or mass lesion/mass effect. Chronic right cerebellar infarct. Patchy low-density changes within the periventricular and subcortical white matter compatible with chronic microvascular ischemic  change. Moderate diffuse cerebral volume loss. Vascular: Atherosclerotic calcifications involving the large vessels of the skull base. No unexpected hyperdense vessel. Skull: Normal. Negative for fracture or focal lesion. Sinuses/Orbits: Bilateral mastoid effusions, left worse than right. Left maxillary sinus mucosal thickening. Other: None. IMPRESSION: 1. No acute intracranial findings. 2. Chronic microvascular ischemic change and cerebral volume loss. 3. Bilateral mastoid effusions, left worse than right. Electronically Signed   By: Davina Poke D.O.   On: 06/23/2021 14:25   DG Chest Portable 1 View  Result Date: 06/24/2021 CLINICAL DATA:  Shortness of breath EXAM: PORTABLE CHEST 1 VIEW COMPARISON:  05/28/2021 FINDINGS: Persistent right basilar opacity. No significant pleural effusion. No pneumothorax. Stable cardiomediastinal contours. IMPRESSION: Persistent right basilar opacity suspicious  for pneumonia. Electronically Signed   By: Macy Mis M.D.   On: 07/07/2021 11:04   [place] EKG SITE RITE  Result Date: 06/25/2021 If Site Rite image not attached, placement could not be confirmed due to current cardiac rhythm.  CT CHEST ABDOMEN PELVIS WO CONTRAST  Result Date: 07/03/2021 CLINICAL DATA:  Possible sepsis. Patient discharge from hospital last week with pneumonia. Oxygen dependent. EXAM: CT CHEST, ABDOMEN AND PELVIS WITHOUT CONTRAST TECHNIQUE: Multidetector CT imaging of the chest, abdomen and pelvis was performed following the standard protocol without IV contrast. COMPARISON:  05/25/2021 FINDINGS: CT CHEST FINDINGS Cardiovascular: Heart is normal in size. Calcified plaque over the left main and 3 vessel coronary arteries. Thoracic aorta is normal in caliber. There is calcified plaque over the thoracic aorta. Remaining vascular structures are unremarkable. Mediastinum/Nodes: No mediastinal or hilar adenopathy visualized on this noncontrast study. Remaining mediastinal structures are  unremarkable. Mild prominence of the proximal to mid esophagus containing ingested material which may be due to dysmotility versus reflux. Lungs/Pleura: Lungs are adequately inflated demonstrate interval worsening of consolidation over the right lower lobe. Interval development of right lower lobe bronchial obstruction beginning over the right mainstem bronchus. Subtle hazy nodular airspace opacification over the right upper lobe. Interval development/worsening of patchy opacification over the posterior left lower lobe. Musculoskeletal: No focal abnormality. CT ABDOMEN PELVIS FINDINGS Hepatobiliary: Moderate cholelithiasis. Liver and biliary tree are normal. Pancreas: Unremarkable.  Mild fatty atrophy. Spleen: Normal. Adrenals/Urinary Tract: Adrenal glands are normal. Kidneys are normal in size without hydronephrosis. Punctate nonobstructing stone over the lower pole right kidney. Ureters and bladder are unremarkable. Stomach/Bowel: Percutaneous gastrostomy tube in adequate position. Small bowel is normal. Appendix is normal. Mild diverticulosis of the colon. The descending colon is decompressed. Mild fecal retention over the rectosigmoid colon. Vascular/Lymphatic: Calcified plaque over the abdominal aorta which is normal in caliber. No adenopathy. Reproductive: Moderately enlarged uterus with multiple calcified fibroids as the largest fibroid measures proximally 5.7 cm. Ovaries unremarkable. Other: No significant free fluid or focal inflammatory change. Musculoskeletal: No focal abnormality. IMPRESSION: 1. Interval worsening of consolidation over the right lower lobe with new right lower lobe bronchial obstruction beginning at the right mainstem bronchus. This is likely mostly related to postobstructive atelectasis as concomitant infection is possible. Subtle hazy nodular airspace opacification over the right upper lobe and posterior left lower lobe suggesting an infectious process and less likely inflammatory  disease. 2. Moderate cholelithiasis. 3. Ingested material within the proximal to mid esophagus which may be due to dysmotility or reflux. 4. Punctate nonobstructing right renal stone. 5. Mild colonic diverticulosis. 6. Enlarged fibroid uterus. 7. Aortic atherosclerosis. Atherosclerotic coronary artery disease. Aortic Atherosclerosis (ICD10-I70.0). Electronically Signed   By: Marin Olp M.D.   On: 07/02/2021 14:38   US ABDOMEN LIMITED RUQ (LIVER/GB)  Result Date: 06/21/2021 CLINICAL DATA:  Fever EXAM: ULTRASOUND ABDOMEN LIMITED RIGHT UPPER QUADRANT COMPARISON:  05/26/2021 FINDINGS: Gallbladder: Sludge and stones noted within the gallbladder lumen. No significant gallbladder wall thickening allowing for under distension. No significant pericholecystic fluid. Sonographic Murphy sign negative per technologist. Common bile duct: Diameter: 6 mm Liver: No focal hepatic lesion. Mild nonspecific coarsening of parenchymal echogenicity. Portal vein is patent on color Doppler imaging with normal direction of blood flow towards the liver. Other: None. IMPRESSION: Redemonstration of cholelithiasis without definitive evidence of acute cholecystitis. Electronically Signed   By: Miachel Roux M.D.   On: 06/23/2021 12:06      IMPRESSION AND PLAN:  Principal Problem:   Sepsis  due to pneumonia ([place])  1.  Right basal community-acquired pneumonia with subsequent sepsis as manifested by fever, tachycardia, tachypnea and significant leukocytosis.  The patient has evidence of severe sepsis with lactic acid of more than 2.  I do not believe she is in septic shock. - The patient will be admitted to stepdown unit bed. - We will continue antibiotic therapy with IV, cefepime and Flagyl for broad-spectrum coverage for now given her significant leukocytosis. - Mucolytic therapy and bronchodilator therapy will be added. - We will follow blood and sputum culture. - Pneumonia antigens will be checked.  2.  Uncontrolled type 2  diabetes mellitus with hyperglycemia and peripheral neuropathy. - The patient will be placed on supplement coverage with resistant NovoLog per protocol. - We will continue Neurontin.  3.  Dyslipidemia. - We will continue statin therapy.  4.  Essential hypertension. - We will monitor BP and resume her antihypertensives.  5.  Obstructive sleep apnea. - She will have CPAP nightly. - She will likely need a new prescription for new CPAP upon discharge.   DVT prophylaxis: Lovenox.  Code Status: full code.  Family Communication:  The plan of care was discussed in details with the patient (and family). I answered all questions. The patient agreed to proceed with the above mentioned plan. Further management will depend upon hospital course. Disposition Plan: Back to previous home environment Consults called: none.  All the records are reviewed and case discussed with ED provider.  Status is: Inpatient  Remains inpatient appropriate because:Ongoing diagnostic testing needed not appropriate for outpatient work up, Unsafe d/c plan, IV treatments appropriate due to intensity of illness or inability to take PO, and Inpatient level of care appropriate due to severity of illness   Dispo: The patient is from: Home              Anticipated d/c is to: Home              Patient currently is not medically stable to d/c.              Difficult to place patient: No  TOTAL TIME TAKING CARE OF THIS PATIENT: 60 minutes.     Christel Mormon M.D on 07/06/2021 at 2:47 PM  [facility]   From 7 PM-7 AM, contact night-coverage [email or web address]  CC: Primary care physician; Tracie Harrier, MD

## 2021-06-22 NOTE — ED Notes (Signed)
Messaged provider about pt CBG. Awaiting orders. Pt endorsing pain but with op2 sat this RN does not feel it is appropriate to give roxy into peg tube

## 2021-06-22 NOTE — Progress Notes (Signed)
PHARMACY -  BRIEF ANTIBIOTIC NOTE   Pharmacy has received consult(s) for cefepime and vancomycin from an ED provider.  The patient's profile has been reviewed for ht/wt/allergies/indication/available labs.    One time order(s) placed for vancomycin 1 g + cefepime 2 g  Further antibiotics/pharmacy consults should be ordered by admitting physician if indicated.                       Thank you,  Tawnya Crook, PharmD, BCPS Clinical Pharmacist 06/23/2021 10:43 AM

## 2021-06-22 NOTE — ED Triage Notes (Signed)
Patient to ED from home via EMS for AMS. Sepsis alert called in the field. Patient was discharged from hospital last week with pneumonia. Patient chronically wears 2L [place] at home and is currently on 4L with EMS. Patient responding to voice at this time.

## 2021-06-22 NOTE — Sepsis Progress Note (Signed)
Notified provider and bedside nurse of need to order and draw repeat lactic acid.  

## 2021-06-22 NOTE — Progress Notes (Signed)
CODE SEPSIS - PHARMACY COMMUNICATION  **Broad Spectrum Antibiotics should be administered within 1 hour of Sepsis diagnosis**  Time Code Sepsis Called/Page Received: 1040  Antibiotics Ordered: cefepime/metronidazole/vancomycin  Time of 1st antibiotic administration: [place], PharmD, BCPS Clinical Pharmacist 06/27/2021 11:42 AM

## 2021-06-22 NOTE — ED Provider Notes (Signed)
[facility] Emergency Department Provider Note  ____________________________________________   Event Date/Time   First MD Initiated Contact with Patient 07/11/2021 [id number]     (approximate)  I have reviewed the triage vital signs and the nursing notes.   HISTORY  Chief Complaint Code Sepsis    HPI Veronica Anderson is a 75 y.o. female  stroke, CAD with previous MI, CAD, vitamin D deficiency, osteoarthritis, allergic rhinitis, hypertension UTI, CKD stage IV, anemia of chronic kidney disease, chronic diastolic CHF, chronic hypoxemic respiratory failure, type II DM with peripheral neuropathy, OSA who comes in with concerns for code sepsis.  Patient is typically on 2 L of oxygen but had to be turned up to 4 L.  Patient is able to wake to name and she is able to repeat back what her name is but otherwise is not able to give any history of her illness  Unable to get full HPI from patient due to altered mental status.  She was noted to have elevated glucose levels with EMS.   On review of records patient was just discharged from the hospital on 11/20 after admission from 11/10.  At that time she was noted to have diarrhea secondary to refeeding syndrome after a G-tube was recently placed as well as septic shock thought to be secondary to aspirin pneumonia.  She is noted to be confused and the symptoms got better with treatment.            Past Medical History:  Diagnosis Date   Allergy    Anemia    Asthma    Cataract    bilateral   CHF (NYHA class II, ACC/AHA stage C) (HCC)    Chronic kidney disease    stage IV (severe), Dr. Aleene Davidson   Constipation    Diabetes mellitus without complication ([place])    Edema    feet/ankles   GERD (gastroesophageal reflux disease)    Hyperlipidemia    Hypertension    Left ankle pain    Lumbago    Myocardial infarction ([place])    Neuropathy    Neuropathy    OA (osteoarthritis)    Obesity    Obstructive sleep apnea syndrome     CPAP   Paresthesia    Foot   Protein calorie malnutrition (HCC)    Requires supplemental oxygen    Stroke ([place])    Vitamin D deficiency     Patient Active Problem List   Diagnosis Date Noted   Hypokalemia 06/01/2021   Hypophosphatemia 06/01/2021   Hypernatremia 06/01/2021   Thrombocytopenia ([place]) 05/31/2021   Severe sepsis with septic shock ([place]) 05/28/2021   Malnutrition of moderate degree 05/27/2021   Pressure ulcer, sacrum 05/26/2021    Class: Stage 2   Aspiration pneumonia of right lower lobe (HCC)    Acute respiratory failure with hypoxia ([place]) 05/25/2021   Acute respiratory failure with hypoxemia ([place]) 06/28/2018   Post-menopausal bleeding 11/18/2017   UTI (urinary tract infection) 09/11/2017   Pressure injury of skin 09/11/2017   Chronic kidney disease (CKD), stage IV (severe) (HCC) 07/29/2017   Mild protein-calorie malnutrition ([place]) 07/29/2017   Anemia due to chronic kidney disease    Prolonged Q-T interval on ECG 12/23/2016   CHF (congestive heart failure), NYHA class I ([place]) 11/07/2016   Nocturnal oxygen desaturation 06/26/2015   Anemia in chronic illness 02/23/2015   Asthma, mild intermittent 02/23/2015   Benign essential HTN 59/16/3846   Diastolic heart failure, NYHA class 3 ([place]) 02/23/2015  Benign hypertension with chronic kidney disease, stage III ([place]) 02/23/2015   CN (constipation) 02/23/2015   Diabetes mellitus with neuropathy ([place]) 02/23/2015   Dyslipidemia 02/23/2015   Elevated ferritin 02/23/2015   Gastro-esophageal reflux disease without esophagitis 02/23/2015   LBP (low back pain) 02/23/2015   Morbid obesity due to excess calories ([place]) 02/23/2015   Obstructive apnea 02/23/2015   Osteoarthritis 02/23/2015   Neuropathy 02/23/2015   Perennial allergic rhinitis with seasonal variation 02/23/2015   History of pneumonia 02/23/2015   Vitamin D deficiency 02/23/2015   Cataract 04/16/2010   Background diabetic retinopathy ([place]) 12/18/2009    Past  Surgical History:  Procedure Laterality Date   ANKLE SURGERY Left    CATARACT EXTRACTION W/PHACO Right 12/20/2015   Procedure: CATARACT EXTRACTION PHACO AND INTRAOCULAR LENS PLACEMENT (IOC);  Surgeon: Eulogio Bear, MD;  Location: [facility] ORS;  Service: Ophthalmology;  Laterality: Right;  [place] 2.50 AP% 21.8 CDE 37.04 Fluid pack lot # [id number]   CATARACT EXTRACTION W/PHACO Left 02/14/2016   Procedure: CATARACT EXTRACTION PHACO AND INTRAOCULAR LENS PLACEMENT (IOC);  Surgeon: Eulogio Bear, MD;  Location: [facility] ORS;  Service: Ophthalmology;  Laterality: Left;  [place] 3.12 AP% 42.1 CDE 47.12 Fluid Pack lot # [id number]   COLONOSCOPY     FRACTURE SURGERY Left    ankle   IR GASTROSTOMY TUBE MOD SED  05/30/2021    Prior to Admission medications   Medication Sig Start Date End Date Taking? Authorizing Provider  cloNIDine (CATAPRES - DOSED IN MG/24 HR) 0.3 mg/24hr patch PLACE 1 PATCH (0.3 MG TOTAL) ONTO THE SKIN ONCE A WEEK. Patient taking differently: Place 0.3 mg onto the skin every Sunday. 01/28/18   Steele Sizer, MD  desonide (DESOWEN) 0.05 % ointment Apply 1 application topically 2 (two) times daily. 10/23/17   Poulose, Bethel Born, NP  ferrous sulfate 300 (60 Fe) MG/5ML syrup Take 5 mLs (300 mg total) by mouth daily. 06/02/21 07/02/21  Sharen Hones, MD  furosemide (LASIX) 40 MG tablet Place 1 tablet (40 mg total) into feeding tube daily. 06/02/21   Sharen Hones, MD  gabapentin (NEURONTIN) 300 MG capsule Place 1 capsule (300 mg total) into feeding tube 2 (two) times daily. 06/02/21   Sharen Hones, MD  glucose blood (ACCU-CHEK AVIVA PLUS) test strip 1 each by Other route 2 (two) times daily. Use as instructed 08/06/17   Steele Sizer, MD  Insulin Pen Needle (NOVOFINE) 32G X 6 MM MISC Inject 1 application into the skin 4 (four) times daily. 10/23/17   Poulose, Bethel Born, NP  medroxyPROGESTERone (PROVERA) 10 MG tablet Take 2 tablets (20 mg total) by mouth daily. 11/18/17   Gae Dry, MD   nutrition supplement, JUVEN, Fanny Dance) PACK Place 1 packet into feeding tube 2 (two) times daily between meals. 06/02/21   Sharen Hones, MD  Nutritional Supplements (FEEDING SUPPLEMENT, OSMOLITE 1.2 CAL,) LIQD Place 237 mLs into feeding tube 6 (six) times daily. 06/02/21   Sharen Hones, MD  nystatin cream (MYCOSTATIN) Apply 1 application topically 2 (two) times daily. 05/09/21   [provider]  oxyCODONE (ROXICODONE) 5 MG/5ML solution Place 5 mLs (5 mg total) into feeding tube every 6 (six) hours as needed for moderate pain. 06/02/21   Sharen Hones, MD  pantoprazole sodium (PROTONIX) 40 mg Place 40 mg into feeding tube daily. 06/02/21   Sharen Hones, MD  potassium phosphate, monobasic, (K-PHOS ORIGINAL) 500 MG tablet Place 1 tablet (500 mg total) into feeding tube daily. 06/02/21   Sharen Hones, MD  pravastatin (PRAVACHOL) 40 MG tablet Place 1 tablet (40 mg total) into feeding tube daily. 06/02/21   Sharen Hones, MD  Water For Irrigation, Sterile (FREE WATER) SOLN Place 100 mLs into feeding tube 6 (six) times daily. 06/02/21   Sharen Hones, MD    Allergies Ace inhibitors  Family History  Problem Relation Age of Onset   Diabetes type II Other     Social History Social History   Tobacco Use   Smoking status: Former    Types: Cigarettes    Quit date: 07/14/1994    Years since quitting: 26.9   Smokeless tobacco: Never  Vaping Use   Vaping Use: Never used  Substance Use Topics   Alcohol use: No    Alcohol/week: 0.0 standard drinks   Drug use: No      Review of Systems Unable to get full review of systems due to altered mental status ____________________________________________   PHYSICAL EXAM:  VITAL SIGNS: ED Triage Vitals  Enc Vitals Group     BP 06/13/2021 0951 (!) 111/55     Pulse Rate 06/21/2021 0951 (!) 107     Resp 07/02/2021 0951 (!) 26     Temp 07/11/2021 0951 99 F (37.2 C)     Temp Source 06/30/2021 0951 Oral     SpO2 07/08/2021 0943 99 %     Weight 06/14/2021  0949 198 lb 6.6 oz (90 kg)     Height 06/15/2021 0949 5\' 4"  (1.626 m)     Head Circumference --      Peak Flow --      Pain Score 06/30/2021 0949 0     Pain Loc --      Pain Edu? --      Excl. in [place]? --     Constitutional: Patient wakes to voice and is able to say her name and she is alert and oriented x1 Eyes: Eyes have bilateral crusting noted on them but she is able to open them Head: Atraumatic. Nose: No congestion/rhinnorhea. Mouth/Throat: Mucous membranes are moist.   Neck: No stridor. Trachea Midline. FROM Cardiovascular: Tachycardic. Grossly normal heart sounds.  Good peripheral circulation. Respiratory: Normal respiratory effort.  No retractions. Lungs CTAB.   Gastrointestinal: Soft and nontender. No distention. No abdominal bruits. G-tube in place Musculoskeletal: No lower extremity tenderness nor edema.  No joint effusions. Neurologic: Patient awakes to name, unable to follow commands to get a full neurological assessment Skin:  Skin is warm, dry Psychiatric: Unable to fully assess due to altered mental status GU: Deferred   ____________________________________________   LABS (all labs ordered are listed, but only abnormal results are displayed)  Labs Reviewed  CULTURE, BLOOD (ROUTINE X 2)  CULTURE, BLOOD (ROUTINE X 2)  CBC WITH DIFFERENTIAL/PLATELET  COMPREHENSIVE METABOLIC PANEL  MAGNESIUM  URINALYSIS, ROUTINE W REFLEX MICROSCOPIC  LACTIC ACID, PLASMA  LACTIC ACID, PLASMA  BRAIN NATRIURETIC PEPTIDE  BLOOD GAS, VENOUS  PHOSPHORUS  AMMONIA  BETA-HYDROXYBUTYRIC ACID  CK  TSH  T4, FREE  TROPONIN I (HIGH SENSITIVITY)   ____________________________________________   ED ECG REPORT I, Vanessa [place], the attending physician, personally viewed and interpreted this ECG.  Sinus tachycardia rate of 107 without any ST elevation but does have T wave versions into aVL V2 through V6, normal intervals.  These inversions in V3 through V6 do appear  new ____________________________________________  RADIOLOGY I, Vanessa [place], personally viewed and evaluated these images (plain radiographs) as part of my medical decision making, as well as reviewing the  written report by the radiologist.  ED MD interpretation: concern for PNA  Official radiology report(s): CT HEAD WO CONTRAST (5MM)  Result Date: 07/12/2021 CLINICAL DATA:  Head trauma.  Sepsis.  Altered mental status. EXAM: CT HEAD WITHOUT CONTRAST TECHNIQUE: Contiguous axial images were obtained from the base of the skull through the vertex without intravenous contrast. COMPARISON:  05/25/2021 FINDINGS: Brain: No evidence of acute infarction, hemorrhage, hydrocephalus, extra-axial collection or mass lesion/mass effect. Chronic right cerebellar infarct. Patchy low-density changes within the periventricular and subcortical white matter compatible with chronic microvascular ischemic change. Moderate diffuse cerebral volume loss. Vascular: Atherosclerotic calcifications involving the large vessels of the skull base. No unexpected hyperdense vessel. Skull: Normal. Negative for fracture or focal lesion. Sinuses/Orbits: Bilateral mastoid effusions, left worse than right. Left maxillary sinus mucosal thickening. Other: None. IMPRESSION: 1. No acute intracranial findings. 2. Chronic microvascular ischemic change and cerebral volume loss. 3. Bilateral mastoid effusions, left worse than right. Electronically Signed   By: Davina Poke D.O.   On: 07/09/2021 14:25   DG Chest Portable 1 View  Result Date: 07/10/2021 CLINICAL DATA:  Shortness of breath EXAM: PORTABLE CHEST 1 VIEW COMPARISON:  05/28/2021 FINDINGS: Persistent right basilar opacity. No significant pleural effusion. No pneumothorax. Stable cardiomediastinal contours. IMPRESSION: Persistent right basilar opacity suspicious for pneumonia. Electronically Signed   By: Macy Mis M.D.   On: 06/18/2021 11:04   [place] EKG SITE RITE  Result Date:  06/16/2021 If Site Rite image not attached, placement could not be confirmed due to current cardiac rhythm.  CT CHEST ABDOMEN PELVIS WO CONTRAST  Result Date: 07/11/2021 CLINICAL DATA:  Possible sepsis. Patient discharge from hospital last week with pneumonia. Oxygen dependent. EXAM: CT CHEST, ABDOMEN AND PELVIS WITHOUT CONTRAST TECHNIQUE: Multidetector CT imaging of the chest, abdomen and pelvis was performed following the standard protocol without IV contrast. COMPARISON:  05/25/2021 FINDINGS: CT CHEST FINDINGS Cardiovascular: Heart is normal in size. Calcified plaque over the left main and 3 vessel coronary arteries. Thoracic aorta is normal in caliber. There is calcified plaque over the thoracic aorta. Remaining vascular structures are unremarkable. Mediastinum/Nodes: No mediastinal or hilar adenopathy visualized on this noncontrast study. Remaining mediastinal structures are unremarkable. Mild prominence of the proximal to mid esophagus containing ingested material which may be due to dysmotility versus reflux. Lungs/Pleura: Lungs are adequately inflated demonstrate interval worsening of consolidation over the right lower lobe. Interval development of right lower lobe bronchial obstruction beginning over the right mainstem bronchus. Subtle hazy nodular airspace opacification over the right upper lobe. Interval development/worsening of patchy opacification over the posterior left lower lobe. Musculoskeletal: No focal abnormality. CT ABDOMEN PELVIS FINDINGS Hepatobiliary: Moderate cholelithiasis. Liver and biliary tree are normal. Pancreas: Unremarkable.  Mild fatty atrophy. Spleen: Normal. Adrenals/Urinary Tract: Adrenal glands are normal. Kidneys are normal in size without hydronephrosis. Punctate nonobstructing stone over the lower pole right kidney. Ureters and bladder are unremarkable. Stomach/Bowel: Percutaneous gastrostomy tube in adequate position. Small bowel is normal. Appendix is normal. Mild  diverticulosis of the colon. The descending colon is decompressed. Mild fecal retention over the rectosigmoid colon. Vascular/Lymphatic: Calcified plaque over the abdominal aorta which is normal in caliber. No adenopathy. Reproductive: Moderately enlarged uterus with multiple calcified fibroids as the largest fibroid measures proximally 5.7 cm. Ovaries unremarkable. Other: No significant free fluid or focal inflammatory change. Musculoskeletal: No focal abnormality. IMPRESSION: 1. Interval worsening of consolidation over the right lower lobe with new right lower lobe bronchial obstruction beginning at the right mainstem bronchus. This  is likely mostly related to postobstructive atelectasis as concomitant infection is possible. Subtle hazy nodular airspace opacification over the right upper lobe and posterior left lower lobe suggesting an infectious process and less likely inflammatory disease. 2. Moderate cholelithiasis. 3. Ingested material within the proximal to mid esophagus which may be due to dysmotility or reflux. 4. Punctate nonobstructing right renal stone. 5. Mild colonic diverticulosis. 6. Enlarged fibroid uterus. 7. Aortic atherosclerosis. Atherosclerotic coronary artery disease. Aortic Atherosclerosis (ICD10-I70.0). Electronically Signed   By: Marin Olp M.D.   On: 06/21/2021 14:38   US ABDOMEN LIMITED RUQ (LIVER/GB)  Result Date: 06/25/2021 CLINICAL DATA:  Fever EXAM: ULTRASOUND ABDOMEN LIMITED RIGHT UPPER QUADRANT COMPARISON:  05/26/2021 FINDINGS: Gallbladder: Sludge and stones noted within the gallbladder lumen. No significant gallbladder wall thickening allowing for under distension. No significant pericholecystic fluid. Sonographic Murphy sign negative per technologist. Common bile duct: Diameter: 6 mm Liver: No focal hepatic lesion. Mild nonspecific coarsening of parenchymal echogenicity. Portal vein is patent on color Doppler imaging with normal direction of blood flow towards the liver.  Other: None. IMPRESSION: Redemonstration of cholelithiasis without definitive evidence of acute cholecystitis. Electronically Signed   By: Miachel Roux M.D.   On: 07/08/2021 12:06    ____________________________________________   PROCEDURES  Procedure(s) performed (including Critical Care):  .1-3 Lead EKG Interpretation Performed by: Vanessa [place]se[place], MD Authorized by: Vanessa [place], MD     Interpretation: abnormal     ECG rate:  100s   ECG rate assessment: tachycardic     Rhythm: sinus tachycardia     Ectopy: none     Conduction: normal   .Critical Care Performed by: Vanessa [place], MD Authorized by: Vanessa [place], MD   Critical care provider statement:    Critical care time (minutes):  35   Critical care was necessary to treat or prevent imminent or life-threatening deterioration of the following conditions:  Sepsis   Critical care was time spent personally by me on the following activities:  Development of treatment plan with patient or surrogate, discussions with consultants, evaluation of patient's response to treatment, examination of patient, ordering and review of laboratory studies, ordering and review of radiographic studies, ordering and performing treatments and interventions, pulse oximetry, re-evaluation of patient's condition and review of old charts   ____________________________________________   INITIAL IMPRESSION / [place] / ED COURSE  Veronica Anderson was evaluated in Emergency Department on 06/18/2021 for the symptoms described in the history of present illness. She was evaluated in the context of the global COVID-19 pandemic, which necessitated consideration that the patient might be at risk for infection with the SARS-CoV-2 virus that causes COVID-19. Institutional protocols and algorithms that pertain to the evaluation of patients at risk for COVID-19 are in a state of rapid change based on information released by regulatory bodies including the CDC and  federal and state organizations. These policies and algorithms were followed during the patient's care in the ED.    Patient comes in with concerns for altered mental status, increasing oxygen requirement.  Chest x-ray ordered evaluate for aspiration, pneumonia, cardiac markers, EKG to evaluate for ACS.  Patient be kept on the cardiac monitor.  Labs ordered evaluate for any electrolyte abnormalities, AKI.  We will get blood cultures, lactate to evaluate for sepsis given tachycardia and elevated respiratory rate. Also get labs evaluate for DKA.  Given patient's critically ill we will keep her on the cardiac monitor  10:51 AM patient's white count came back at  33,000 and a rectal temperature shows that she is febrile.  Therefore at this time sepsis alert was called.  Patient started on broad-spectrum antibiotics.  Patient had a low blood pressure so full fluid resuscitation was ordered 30 ml/kg.  I did review patient had recent echocardiogram that was normal Tylenol was given through G-tube.  Patient's son is in the room who reports that patient is full code even though she was on hospice.  Son is now in the room who states that he noted that she had was having some elevated heart rates with concern for some low oxygen levels since yesterday.  She is normally nonambulatory but able to carry on a conversation and she was more fatigued today.  She uses CPAP at nighttime.  Patient is currently on 5 L of oxygen with oxygen levels in the 90s  VBG is reassuring with no evidence of hypercapnia   CT imaging was negative just shows a consolidation and concern for infection of the lung.  Ultrasound without evidence of cholecystitis.  Patient had to be placed on nonrebreather secondary to increasing oxygen needs.  Blood pressures have been stable for 5 hours she has not required any additional pressors.  I placed a consult for PICC she is got 1 line but we have made attempts to get additional line since been  difficult.  I discussed with the hospital team for admission to stepdown although patient does have a chance that she may worsen and require ICU  2:55 PM patient's heart rates remain normal, blood pressures have maps above 65.  Lactate slightly rising but still complaining of fluid and will get repeat lactate afterwards.  Patient does meet criteria for septic shock given elevated lactate and continue to closely monitor admitting to the hospital team        ____________________________________________   FINAL CLINICAL IMPRESSION(S) / ED DIAGNOSES   Final diagnoses:  Fever  Septic shock ([place])  Postobstructive pneumonia      MEDICATIONS GIVEN DURING THIS VISIT:  Medications  vancomycin (VANCOCIN) IVPB 1000 mg/200 mL premix (1,000 mg Intravenous New Bag/Given 06/18/2021 1357)  oxyCODONE (ROXICODONE) 5 MG/5ML solution 5 mg (has no administration in time range)  cloNIDine (CATAPRES - Dosed in mg/24 hr) patch 0.3 mg (has no administration in time range)  pravastatin (PRAVACHOL) tablet 40 mg (has no administration in time range)  pantoprazole sodium (PROTONIX) 40 mg (has no administration in time range)  ferrous sulfate 300 (60 Fe) MG/5ML syrup 300 mg (has no administration in time range)  gabapentin (NEURONTIN) capsule 300 mg (has no administration in time range)  nutrition supplement (JUVEN) (JUVEN) powder packet 1 packet (has no administration in time range)  feeding supplement (OSMOLITE 1.2 CAL) liquid 237 mL (has no administration in time range)  potassium phosphate (monobasic) (K-PHOS ORIGINAL) tablet 500 mg (has no administration in time range)  enoxaparin (LOVENOX) injection 40 mg (has no administration in time range)  ceFEPIme (MAXIPIME) 2 g in sodium chloride 0.9 % 100 mL IVPB (has no administration in time range)  metroNIDAZOLE (FLAGYL) IVPB 500 mg (has no administration in time range)  vancomycin (VANCOCIN) IVPB 1000 mg/200 mL premix (has no administration in time range)   acetaminophen (TYLENOL) tablet 650 mg (has no administration in time range)    Or  acetaminophen (TYLENOL) suppository 650 mg (has no administration in time range)  traZODone (DESYREL) tablet 25 mg (has no administration in time range)  magnesium hydroxide (MILK OF MAGNESIA) suspension 30 mL (has no administration in  time range)  ondansetron (ZOFRAN) tablet 4 mg (has no administration in time range)    Or  ondansetron (ZOFRAN) injection 4 mg (has no administration in time range)  metoCLOPramide (REGLAN) injection 10 mg (has no administration in time range)  sodium chloride 0.9 % bolus 500 mL (0 mLs Intravenous Stopped 06/28/2021 1218)  ceFEPIme (MAXIPIME) 2 g in sodium chloride 0.9 % 100 mL IVPB (0 g Intravenous Stopped 06/15/2021 1143)  metroNIDAZOLE (FLAGYL) IVPB 500 mg (0 mg Intravenous Stopped 06/27/2021 1356)  lactated ringers bolus 1,000 mL (1,000 mLs Intravenous New Bag/Given 07/02/2021 1217)    And  lactated ringers bolus 1,000 mL (1,000 mLs Intravenous New Bag/Given 07/02/2021 1216)  sodium chloride 0.9 % bolus 500 mL (0 mLs Intravenous Stopped 06/18/2021 1218)  acetaminophen (TYLENOL) 160 MG/5ML solution 1,000 mg (1,000 mg Per Tube Given 07/11/2021 1218)  insulin aspart (novoLOG) injection 5 Units (5 Units Intravenous Given 06/14/2021 1352)  sodium bicarbonate injection 50 mEq (50 mEq Intravenous Given 07/11/2021 1352)     ED Discharge Orders     None        Note:  This document was prepared using Dragon voice recognition software and may include unintentional dictation errors.    Vanessa [place], MD 06/27/2021 1455

## 2021-06-23 DIAGNOSIS — R5381 Other malaise: Secondary | ICD-10-CM | POA: Diagnosis not present

## 2021-06-23 DIAGNOSIS — J189 Pneumonia, unspecified organism: Secondary | ICD-10-CM | POA: Diagnosis not present

## 2021-06-23 DIAGNOSIS — E119 Type 2 diabetes mellitus without complications: Secondary | ICD-10-CM

## 2021-06-23 DIAGNOSIS — J9601 Acute respiratory failure with hypoxia: Secondary | ICD-10-CM | POA: Diagnosis not present

## 2021-06-23 DIAGNOSIS — A419 Sepsis, unspecified organism: Secondary | ICD-10-CM | POA: Diagnosis not present

## 2021-06-23 DIAGNOSIS — G4733 Obstructive sleep apnea (adult) (pediatric): Secondary | ICD-10-CM

## 2021-06-23 DIAGNOSIS — I10 Essential (primary) hypertension: Secondary | ICD-10-CM

## 2021-06-23 DIAGNOSIS — Z9989 Dependence on other enabling machines and devices: Secondary | ICD-10-CM

## 2021-06-23 LAB — BLOOD CULTURE ID PANEL (REFLEXED) - BCID2

## 2021-06-23 LAB — GLUCOSE, CAPILLARY
Glucose-Capillary: 194 mg/dL — ABNORMAL HIGH (ref 70–99)
Glucose-Capillary: 252 mg/dL — ABNORMAL HIGH (ref 70–99)
Glucose-Capillary: 429 mg/dL — ABNORMAL HIGH (ref 70–99)
Glucose-Capillary: 236 mg/dL — ABNORMAL HIGH (ref 70–99)

## 2021-06-23 LAB — CBC
HCT: 31.7 % — ABNORMAL LOW (ref 36.0–46.0)
Hemoglobin: 9.6 g/dL — ABNORMAL LOW (ref 12.0–15.0)
MCH: 25.7 pg — ABNORMAL LOW (ref 26.0–34.0)
MCHC: 30.3 g/dL (ref 30.0–36.0)
MCV: 85 fL (ref 80.0–100.0)
Platelets: 458 10*3/uL — ABNORMAL HIGH (ref 150–400)
RBC: 3.73 MIL/uL — ABNORMAL LOW (ref 3.87–5.11)
RDW: 18.2 % — ABNORMAL HIGH (ref 11.5–15.5)
WBC: 35 10*3/uL — ABNORMAL HIGH (ref 4.0–10.5)
nRBC: 0 % (ref 0.0–0.2)

## 2021-06-23 LAB — PROTIME-INR
INR: 1.3 — ABNORMAL HIGH (ref 0.8–1.2)
Prothrombin Time: 16.3 seconds — ABNORMAL HIGH (ref 11.4–15.2)

## 2021-06-23 LAB — COMPREHENSIVE METABOLIC PANEL
ALT: 12 U/L (ref 0–44)
AST: 13 U/L — ABNORMAL LOW (ref 15–41)
Albumin: 2.5 g/dL — ABNORMAL LOW (ref 3.5–5.0)
Alkaline Phosphatase: 76 U/L (ref 38–126)
Anion gap: 10 (ref 5–15)
BUN: 52 mg/dL — ABNORMAL HIGH (ref 8–23)
CO2: 25 mmol/L (ref 22–32)
Calcium: 8.7 mg/dL — ABNORMAL LOW (ref 8.9–10.3)
Chloride: 103 mmol/L (ref 98–111)
Creatinine, Ser: 1.05 mg/dL — ABNORMAL HIGH (ref 0.44–1.00)
GFR, Estimated: 55 mL/min — ABNORMAL LOW (ref >60)
Glucose, Bld: 91 mg/dL (ref 70–99)
Potassium: 4.5 mmol/L (ref 3.5–5.1)
Sodium: 138 mmol/L (ref 135–145)
Total Bilirubin: 0.7 mg/dL (ref 0.3–1.2)
Total Protein: 6.8 g/dL (ref 6.5–8.1)

## 2021-06-23 LAB — BLOOD GAS, ARTERIAL
Acid-Base Excess: 1.3 mmol/L (ref 0.0–2.0)
Bicarbonate: 25.9 mmol/L (ref 20.0–28.0)
Delivery systems: POSITIVE
Expiratory PAP: 6
FIO2: 0.4
Inspiratory PAP: 14
O2 Saturation: 93.6 %
Patient temperature: 37
pCO2 arterial: 40 mmHg (ref 32.0–48.0)
pH, Arterial: 7.42 (ref 7.350–7.450)
pO2, Arterial: 68 mmHg — ABNORMAL LOW (ref 83.0–108.0)

## 2021-06-23 LAB — CBG MONITORING, ED
Glucose-Capillary: 115 mg/dL — ABNORMAL HIGH (ref 70–99)
Glucose-Capillary: 245 mg/dL — ABNORMAL HIGH (ref 70–99)
Glucose-Capillary: 87 mg/dL (ref 70–99)

## 2021-06-23 LAB — CORTISOL-AM, BLOOD: Cortisol - AM: 41.1 ug/dL — ABNORMAL HIGH (ref 6.7–22.6)

## 2021-06-23 LAB — PROCALCITONIN: Procalcitonin: 1.69 ng/mL

## 2021-06-23 LAB — GLUCOSE, RANDOM: Glucose, Bld: 438 mg/dL — ABNORMAL HIGH (ref 70–99)

## 2021-06-23 MED ORDER — CHLORHEXIDINE GLUCONATE CLOTH 2 % EX PADS: 6.0000 | MEDICATED_PAD | Freq: Every day | CUTANEOUS | Status: DC

## 2021-06-23 MED ORDER — VANCOMYCIN HCL IN DEXTROSE 1-5 GM/200ML-% IV SOLN
1000.0000 mg | INTRAVENOUS | Status: DC
  Administered 2021-06-23: 1000 mg via INTRAVENOUS
  Filled 2021-06-23: qty 200

## 2021-06-23 MED ORDER — INSULIN ASPART 100 UNIT/ML IJ SOLN
20.0000 [IU] | Freq: Once | INTRAMUSCULAR | Status: AC
  Administered 2021-06-23: 20 [IU] via SUBCUTANEOUS
  Filled 2021-06-23: qty 1

## 2021-06-23 MED ORDER — SODIUM CHLORIDE 0.9% FLUSH: 10.0000 mL | INTRAVENOUS | Status: DC | PRN

## 2021-06-23 MED ORDER — SODIUM CHLORIDE 0.9% FLUSH
10.0000 mL | Freq: Two times a day (BID) | INTRAVENOUS | Status: DC
  Administered 2021-06-23 – 2021-06-24 (×2): 10 mL
  Administered 2021-06-24 – 2021-06-25 (×3): 30 mL

## 2021-06-23 MED ORDER — MUPIROCIN 2 % EX OINT
1.0000 "application " | TOPICAL_OINTMENT | Freq: Two times a day (BID) | CUTANEOUS | Status: DC
  Administered 2021-06-23 – 2021-06-25 (×5): 1 via NASAL
  Filled 2021-06-23 (×3): qty 22

## 2021-06-23 MED ORDER — CHLORHEXIDINE GLUCONATE CLOTH 2 % EX PADS
6.0000 | MEDICATED_PAD | Freq: Every day | CUTANEOUS | Status: DC
  Administered 2021-06-23 – 2021-06-24 (×2): 6 via TOPICAL

## 2021-06-23 NOTE — Progress Notes (Signed)
Peripherally Inserted Central Catheter Placement  The IV Nurse has discussed with the patient and/or persons authorized to consent for the patient, the purpose of this procedure and the potential benefits and risks involved with this procedure.  The benefits include less needle sticks, lab draws from the catheter, and the patient may be discharged home with the catheter. Risks include, but not limited to, infection, bleeding, blood clot (thrombus formation), and puncture of an artery; nerve damage and irregular heartbeat and possibility to perform a PICC exchange if needed/ordered by physician.  Alternatives to this procedure were also discussed.  Bard Power PICC patient education guide, fact sheet on infection prevention and patient information card has been provided to patient /or left at bedside.   Consent obtained with son at bedside   PICC Placement Documentation  PICC Double Lumen 06/23/21 PICC Left Brachial 44 cm 0 cm (Active)  Indication for Insertion or Continuance of Line Poor Vasculature-patient has had multiple peripheral attempts or PIVs lasting less than 24 hours 06/23/21 1600  Exposed Catheter (cm) 0 cm 06/23/21 1600  Site Assessment Clean;Dry;Intact 06/23/21 1600  Lumen #1 Status Flushed;Saline locked;Blood return noted 06/23/21 1600  Lumen #2 Status Flushed;Saline locked;Blood return noted 06/23/21 1600  Dressing Type Transparent;Securing device 06/23/21 1600  Dressing Status Clean;Dry;Intact 06/23/21 1600  Antimicrobial disc in place? Yes 06/23/21 1600  Safety Lock Not Applicable 73/41/93 7902  Line Care Connections checked and tightened 06/23/21 1600  Dressing Intervention New dressing 06/23/21 1600  Dressing Change Due 06/30/21 06/23/21 1600       Holley Bouche Loomis 06/23/2021, 4:09 PM

## 2021-06-23 NOTE — ED Notes (Signed)
This RN at bedside to obtain bloodwork. Pt currently sleeping. VSS. Son is at bedside. Pt clean and dry at this time.

## 2021-06-23 NOTE — Progress Notes (Signed)
PHARMACY - PHYSICIAN COMMUNICATION CRITICAL VALUE ALERT - BLOOD CULTURE IDENTIFICATION (BCID)  Veronica Anderson is a 75 y.o. female admitted on 07/02/2021 with sepsis.  Assessment:  BCID identified GPRs growing in 1 of 4 bottles (aerobic bottle). Organism was not identified by BCID.   Name of physician (or Provider) Contacted: Dr. Sidney Ace  Current antibiotics: Vancomycin, cefepime, and Flagyl  Changes to prescribed antibiotics recommended:  Unclear if this is a contaminant, pt is currently on empiric broad spectrum antibiotics. Recommend continuing current antibiotics at this time.   Results for orders placed or performed during the hospital encounter of 06/25/2021  Blood Culture ID Panel (Reflexed) (Collected: 07/08/2021 10:03 AM)  Result Value Ref Range   Enterococcus faecalis NOT DETECTED NOT DETECTED   Enterococcus Faecium NOT DETECTED NOT DETECTED   Listeria monocytogenes NOT DETECTED NOT DETECTED   Staphylococcus species NOT DETECTED NOT DETECTED   Staphylococcus aureus (BCID) NOT DETECTED NOT DETECTED   Staphylococcus epidermidis NOT DETECTED NOT DETECTED   Staphylococcus lugdunensis NOT DETECTED NOT DETECTED   Streptococcus species NOT DETECTED NOT DETECTED   Streptococcus agalactiae NOT DETECTED NOT DETECTED   Streptococcus pneumoniae NOT DETECTED NOT DETECTED   Streptococcus pyogenes NOT DETECTED NOT DETECTED   A.calcoaceticus-baumannii NOT DETECTED NOT DETECTED   Bacteroides fragilis NOT DETECTED NOT DETECTED   Enterobacterales NOT DETECTED NOT DETECTED   Enterobacter cloacae complex NOT DETECTED NOT DETECTED   Escherichia coli NOT DETECTED NOT DETECTED   Klebsiella aerogenes NOT DETECTED NOT DETECTED   Klebsiella oxytoca NOT DETECTED NOT DETECTED   Klebsiella pneumoniae NOT DETECTED NOT DETECTED   Proteus species NOT DETECTED NOT DETECTED   Salmonella species NOT DETECTED NOT DETECTED   Serratia marcescens NOT DETECTED NOT DETECTED   Haemophilus influenzae NOT DETECTED NOT  DETECTED   Neisseria meningitidis NOT DETECTED NOT DETECTED   Pseudomonas aeruginosa NOT DETECTED NOT DETECTED   Stenotrophomonas maltophilia NOT DETECTED NOT DETECTED   Candida albicans NOT DETECTED NOT DETECTED   Candida auris NOT DETECTED NOT DETECTED   Candida glabrata NOT DETECTED NOT DETECTED   Candida krusei NOT DETECTED NOT DETECTED   Candida parapsilosis NOT DETECTED NOT DETECTED   Candida tropicalis NOT DETECTED NOT DETECTED   Cryptococcus neoformans/gattii NOT DETECTED NOT DETECTED    Darnelle Bos, PharmD 06/23/2021  8:21 PM

## 2021-06-23 NOTE — Progress Notes (Signed)
PROGRESS NOTE  Veronica Anderson GNF:[id number] DOB: August 11, 1945 DOA: 06/15/2021 PCP: Tracie Harrier, MD   LOS: 1 day   Brief narrative:  Veronica Anderson is a 75 years old female with past medical history of CHF, stage IV CKD, type 2 diabetes, hypertension, dyslipidemia and peripheral neuropathy with sleep apnea on CPAP presented to hospital with altered mental status with cough and wheezing.  Initially patient was noted to be hypoxic with pulse ox of 84% on room air which improved to 94% on 50% FiO2 Ventimask.  Of note, patient was recently admitted to the hospital for aspiration pneumonia and sepsis and was discharged on 06/02/2021.  On this presentation patient had a fever of 100.6 F with borderline low blood pressure at 91/54 with mild tachypnea and tachycardia.  Initial labs showed potassium of 5.3 with glucose of 432. high-sensitivity troponin I was 23.  CBC showed significant leukocytosis of 33.7 with neutrophilia and macrocytosis as well as anemia.  Influenza antigens and COVID-19 PCR came back negative.  Blood cultures were drawn.  X-ray showed a persistent right basilar opacity suspicious for pneumonia.  Right upper quadrant ultrasound showed cholelithiasis without any evidence of cholecystitis.  In the ED, patient was given IV fluids antibiotic with vancomycin cefepime and Flagyl and patient was then  was admitted to the hospital for further evaluation and treatment.  Assessment/Plan:  Principal Problem:   Sepsis due to pneumonia ([place])  Sepsis secondary to right basal pneumonia with acute hypoxic respiratory failure.  Currently on BiPAP. Patient had signs and symptoms of sepsis as evidenced by fever, tachycardia, tachypnea and significant leukocytosis with elevated lactate.  We will continue IV cefepime, vancomycin and Flagyl for now.  We will follow blood cultures, sputum culture.  Continue bronchodilators.  Blood cultures negative in less than 24 hours.  Patient received septic fluid bolus  in the ED.  Temperature max of 100.6 F.  Patient required high flow nasal cannula oxygen and will continue BiPAP.  Patient appears to be somnolent at this time but appears to be more comfortable as per the patient's son.   Uncontrolled type 2 diabetes mellitus with hyperglycemia and peripheral neuropathy. Continue sliding scale insulin, Accu-Cheks, diabetic diet.  Continue Neurontin   Dyslipidemia. Continue pravastatin.  Essential hypertension. On clonidine patch.  Patient takes Lasix 40 daily at home.  Will hold for,  Obstructive sleep apnea. Will likely need CPAP new prescription on discharge since she has older CPAP and has not been changed in several years now..  Continue BiPAP while in the hospital  Debility, deconditioning.  Get PT evaluation when patient is more stable.  Ethics goals of care.  We will get palliative care consultation.  Patient's son states full code at this time  DVT prophylaxis: enoxaparin (LOVENOX) injection 40 mg Start: 07/11/2021 2200   Code Status: Full code as per the patient's son at bedside.  Family Communication: Spoke with the patient's son at bedside.  Son states that the patient was in hospice in the past and was recently discharged from hospice at home.  Status is: Inpatient  Remains inpatient appropriate because: Acute hypoxic respiratory failure on BiPAP, sepsis, pneumonia   Consultants: None  Procedures: BiPAP  Anti-infectives:  Vancomycin metronidazole and cefepime  Anti-infectives (From admission, onward)    Start     Dose/Rate Route Frequency Ordered Stop   06/23/21 1600  vancomycin (VANCOREADY) IVPB 750 mg/150 mL        750 mg 150 mL/hr over 60 Minutes Intravenous Every 24 hours 06/18/2021  1509     07/13/2021 2200  metroNIDAZOLE (FLAGYL) IVPB 500 mg        500 mg 100 mL/hr over 60 Minutes Intravenous Every 12 hours 07/10/2021 1438 06/29/21 2159   07/13/2021 2200  ceFEPIme (MAXIPIME) 2 g in sodium chloride 0.9 % 100 mL IVPB        2  g 200 mL/hr over 30 Minutes Intravenous Every 12 hours 06/14/2021 1511     07/11/2021 1445  ceFEPIme (MAXIPIME) 2 g in sodium chloride 0.9 % 100 mL IVPB  Status:  Discontinued        2 g 200 mL/hr over 30 Minutes Intravenous  Once 07/09/2021 1438 06/23/2021 1511   06/28/2021 1445  vancomycin (VANCOCIN) IVPB 1000 mg/200 mL premix        1,000 mg 200 mL/hr over 60 Minutes Intravenous  Once 07/09/2021 1438 06/13/2021 1710   06/19/2021 1045  ceFEPIme (MAXIPIME) 2 g in sodium chloride 0.9 % 100 mL IVPB        2 g 200 mL/hr over 30 Minutes Intravenous  Once 06/28/2021 1040 07/06/2021 1143   06/23/2021 1045  metroNIDAZOLE (FLAGYL) IVPB 500 mg        500 mg 100 mL/hr over 60 Minutes Intravenous  Once 07/05/2021 1040 07/08/2021 1356   07/02/2021 1045  vancomycin (VANCOCIN) IVPB 1000 mg/200 mL premix        1,000 mg 200 mL/hr over 60 Minutes Intravenous  Once 07/12/2021 1040 06/16/2021 1501       Subjective: Today, patient was seen and examined at bedside.  On BiPAP.  Patient's son at bedside.  Patient appears to be somnolent.  Since on discharge status he appears to be more comfortable with breathing at this time.  Objective: Vitals:   06/23/21 0800 06/23/21 0829  BP: (!) 100/51   Pulse:  (!) 109  Resp: 20 19  Temp:    SpO2: 98% 90%    Intake/Output Summary (Last 24 hours) at 06/23/2021 0910 Last data filed at 06/23/2021 0110 Gross per 24 hour  Intake 200 ml  Output --  Net 200 ml   Filed Weights   06/29/2021 0949  Weight: 90 kg   Body mass index is 34.06 kg/m.   Physical Exam:  GENERAL: Patient is somnolent, on BiPAP, obese HENT: No scleral pallor or icterus. Pupils equally reactive to light. Oral mucosa is moist NECK: is supple, no gross swelling noted. CHEST: Coarse breath sounds noted, crackles noted CVS: S1 and S2 heard, no murmur. Regular rate and rhythm.  ABDOMEN: Soft, non-tender, bowel sounds are present.  PEG tube in place. EXTREMITIES: No edema. CNS moving extremities. SKIN: warm and dry  without rashes.  Data Review: I have personally reviewed the following laboratory data and studies,  CBC: Recent Labs  Lab 07/08/2021 1003 06/23/21 0420  WBC 33.7* 35.0*  NEUTROABS 29.0*  --   HGB 10.3* 9.6*  HCT 34.5* 31.7*  MCV 86.3 85.0  PLT 415* 749*   Basic Metabolic Panel: Recent Labs  Lab 06/23/2021 1003 07/04/2021 1320 06/23/21 0420  NA 138 137 138  K 6.5* 5.3* 4.5  CL 100 102 103  CO2 25 22 25   GLUCOSE 384* 432* 91  BUN 50* 48* 52*  CREATININE 1.22* 1.10* 1.05*  CALCIUM 9.1 8.5* 8.7*  MG 2.1  --   --   PHOS 4.1  --   --    Liver Function Tests: Recent Labs  Lab 07/06/2021 1003 06/23/21 0420  AST 18 13*  ALT 10  12  ALKPHOS 75 76  BILITOT 1.0 0.7  PROT 7.5 6.8  ALBUMIN 2.8* 2.5*   No results for input(s): LIPASE, AMYLASE in the last 168 hours. Recent Labs  Lab 06/24/2021 1320  AMMONIA 15   Cardiac Enzymes: Recent Labs  Lab 07/07/2021 1003  CKTOTAL 27*   BNP (last 3 results) Recent Labs    05/25/21 1310 06/20/2021 1003  BNP 103.1* 800.2*    ProBNP (last 3 results) No results for input(s): PROBNP in the last 8760 hours.  CBG: Recent Labs  Lab 07/12/2021 1741 06/25/2021 2048 06/23/21 0003 06/23/21 0419 06/23/21 0807  GLUCAP 402* 426* 245* 87 115*   Recent Results (from the past 240 hour(s))  Blood culture (routine x 2)     Status: None (Preliminary result)   Collection Time: 06/21/2021 10:03 AM   Specimen: BLOOD  Result Value Ref Range Status   Specimen Description BLOOD BLOOD LEFT HAND  Final   Special Requests   Final    BOTTLES DRAWN AEROBIC AND ANAEROBIC Blood Culture results may not be optimal due to an inadequate volume of blood received in culture bottles   Culture   Final    NO GROWTH < 24 HOURS Performed at [facility], [address] [place], [place] [address]    Report Status PENDING  Incomplete  Blood culture (routine x 2)     Status: None (Preliminary result)   Collection Time: 06/21/2021 10:03 AM   Specimen: BLOOD   Result Value Ref Range Status   Specimen Description BLOOD BLOOD LEFT FOREARM  Final   Special Requests   Final    BOTTLES DRAWN AEROBIC AND ANAEROBIC Blood Culture results may not be optimal due to an inadequate volume of blood received in culture bottles   Culture   Final    NO GROWTH < 24 HOURS Performed at [facility], [address] [place], [place] [address]    Report Status PENDING  Incomplete  Resp Panel by RT-PCR (Flu A&B, Covid) Nasopharyngeal Swab     Status: None   Collection Time: 06/24/2021 10:34 AM   Specimen: Nasopharyngeal Swab; Nasopharyngeal(NP) swabs in vial transport medium  Result Value Ref Range Status   SARS Coronavirus 2 by RT PCR NEGATIVE NEGATIVE Final    Comment: (NOTE) SARS-CoV-2 target nucleic acids are NOT DETECTED.  The SARS-CoV-2 RNA is generally detectable in upper respiratory specimens during the acute phase of infection. The lowest concentration of SARS-CoV-2 viral copies this assay can detect is 138 copies/mL. A negative result does not preclude SARS-Cov-2 infection and should not be used as the sole basis for treatment or other patient management decisions. A negative result may occur with  improper specimen collection/handling, submission of specimen other than nasopharyngeal swab, presence of viral mutation(s) within the areas targeted by this assay, and inadequate number of viral copies(<138 copies/mL). A negative result must be combined with clinical observations, patient history, and epidemiological information. The expected result is Negative.  Fact Sheet for Patients:  EntrepreneurPulse.com.au  Fact Sheet for Healthcare Providers:  IncredibleEmployment.be  This test is no t yet approved or cleared by the [place] FDA and  has been authorized for detection and/or diagnosis of SARS-CoV-2 by FDA under an Emergency Use Authorization (EUA). This EUA will remain  in effect (meaning this  test can be used) for the duration of the COVID-19 declaration under Section 564(b)(1) of the Act, 21 U.S.C.section 360bbb-3(b)(1), unless the authorization is terminated  or revoked sooner.  Influenza A by PCR NEGATIVE NEGATIVE Final   Influenza B by PCR NEGATIVE NEGATIVE Final    Comment: (NOTE) The Xpert Xpress SARS-CoV-2/FLU/RSV plus assay is intended as an aid in the diagnosis of influenza from Nasopharyngeal swab specimens and should not be used as a sole basis for treatment. Nasal washings and aspirates are unacceptable for Xpert Xpress SARS-CoV-2/FLU/RSV testing.  Fact Sheet for Patients: EntrepreneurPulse.com.au  Fact Sheet for Healthcare Providers: IncredibleEmployment.be  This test is not yet approved or cleared by the [place] FDA and has been authorized for detection and/or diagnosis of SARS-CoV-2 by FDA under an Emergency Use Authorization (EUA). This EUA will remain in effect (meaning this test can be used) for the duration of the COVID-19 declaration under Section 564(b)(1) of the Act, 21 U.S.C. section 360bbb-3(b)(1), unless the authorization is terminated or revoked.  Performed at [facility], [address] [place], [place] [address]   MRSA Next Gen by PCR, Nasal     Status: Abnormal   Collection Time: 06/25/2021  4:11 PM   Specimen: Nasal Mucosa; Nasal Swab  Result Value Ref Range Status   MRSA by PCR Next Gen DETECTED (A) NOT DETECTED Final    Comment: RESULT CALLED TO, READ BACK BY AND VERIFIED WITH: LINDSAY BLACK 06/21/2021 @ 1742 BY SB (NOTE) The GeneXpert MRSA Assay (FDA approved for NASAL specimens only), is one component of a comprehensive MRSA colonization surveillance program. It is not intended to diagnose MRSA infection nor to guide or monitor treatment for MRSA infections. Test performance is not FDA approved in patients less than 25 years old. Performed at [facility], [address], [place],  [address]      Studies: CT HEAD WO CONTRAST (5MM)  Result Date: 07/09/2021 CLINICAL DATA:  Head trauma.  Sepsis.  Altered mental status. EXAM: CT HEAD WITHOUT CONTRAST TECHNIQUE: Contiguous axial images were obtained from the base of the skull through the vertex without intravenous contrast. COMPARISON:  05/25/2021 FINDINGS: Brain: No evidence of acute infarction, hemorrhage, hydrocephalus, extra-axial collection or mass lesion/mass effect. Chronic right cerebellar infarct. Patchy low-density changes within the periventricular and subcortical white matter compatible with chronic microvascular ischemic change. Moderate diffuse cerebral volume loss. Vascular: Atherosclerotic calcifications involving the large vessels of the skull base. No unexpected hyperdense vessel. Skull: Normal. Negative for fracture or focal lesion. Sinuses/Orbits: Bilateral mastoid effusions, left worse than right. Left maxillary sinus mucosal thickening. Other: None. IMPRESSION: 1. No acute intracranial findings. 2. Chronic microvascular ischemic change and cerebral volume loss. 3. Bilateral mastoid effusions, left worse than right. Electronically Signed   By: Davina Poke D.O.   On: 06/20/2021 14:25   DG Chest Portable 1 View  Result Date: 06/14/2021 CLINICAL DATA:  Shortness of breath EXAM: PORTABLE CHEST 1 VIEW COMPARISON:  05/28/2021 FINDINGS: Persistent right basilar opacity. No significant pleural effusion. No pneumothorax. Stable cardiomediastinal contours. IMPRESSION: Persistent right basilar opacity suspicious for pneumonia. Electronically Signed   By: Macy Mis M.D.   On: 06/21/2021 11:04   [place] EKG SITE RITE  Result Date: 07/09/2021 If Site Rite image not attached, placement could not be confirmed due to current cardiac rhythm.  CT CHEST ABDOMEN PELVIS WO CONTRAST  Result Date: 07/06/2021 CLINICAL DATA:  Possible sepsis. Patient discharge from hospital last week with  pneumonia. Oxygen dependent. EXAM: CT CHEST, ABDOMEN AND PELVIS WITHOUT CONTRAST TECHNIQUE: Multidetector CT imaging of the chest, abdomen and pelvis was performed following the standard protocol without IV contrast. COMPARISON:  05/25/2021 FINDINGS:  CT CHEST FINDINGS Cardiovascular: Heart is normal in size. Calcified plaque over the left main and 3 vessel coronary arteries. Thoracic aorta is normal in caliber. There is calcified plaque over the thoracic aorta. Remaining vascular structures are unremarkable. Mediastinum/Nodes: No mediastinal or hilar adenopathy visualized on this noncontrast study. Remaining mediastinal structures are unremarkable. Mild prominence of the proximal to mid esophagus containing ingested material which may be due to dysmotility versus reflux. Lungs/Pleura: Lungs are adequately inflated demonstrate interval worsening of consolidation over the right lower lobe. Interval development of right lower lobe bronchial obstruction beginning over the right mainstem bronchus. Subtle hazy nodular airspace opacification over the right upper lobe. Interval development/worsening of patchy opacification over the posterior left lower lobe. Musculoskeletal: No focal abnormality. CT ABDOMEN PELVIS FINDINGS Hepatobiliary: Moderate cholelithiasis. Liver and biliary tree are normal. Pancreas: Unremarkable.  Mild fatty atrophy. Spleen: Normal. Adrenals/Urinary Tract: Adrenal glands are normal. Kidneys are normal in size without hydronephrosis. Punctate nonobstructing stone over the lower pole right kidney. Ureters and bladder are unremarkable. Stomach/Bowel: Percutaneous gastrostomy tube in adequate position. Small bowel is normal. Appendix is normal. Mild diverticulosis of the colon. The descending colon is decompressed. Mild fecal retention over the rectosigmoid colon. Vascular/Lymphatic: Calcified plaque over the abdominal aorta which is normal in caliber. No adenopathy. Reproductive: Moderately enlarged  uterus with multiple calcified fibroids as the largest fibroid measures proximally 5.7 cm. Ovaries unremarkable. Other: No significant free fluid or focal inflammatory change. Musculoskeletal: No focal abnormality. IMPRESSION: 1. Interval worsening of consolidation over the right lower lobe with new right lower lobe bronchial obstruction beginning at the right mainstem bronchus. This is likely mostly related to postobstructive atelectasis as concomitant infection is possible. Subtle hazy nodular airspace opacification over the right upper lobe and posterior left lower lobe suggesting an infectious process and less likely inflammatory disease. 2. Moderate cholelithiasis. 3. Ingested material within the proximal to mid esophagus which may be due to dysmotility or reflux. 4. Punctate nonobstructing right renal stone. 5. Mild colonic diverticulosis. 6. Enlarged fibroid uterus. 7. Aortic atherosclerosis. Atherosclerotic coronary artery disease. Aortic Atherosclerosis (ICD10-I70.0). Electronically Signed   By: Marin Olp M.D.   On: 06/21/2021 14:38   US ABDOMEN LIMITED RUQ (LIVER/GB)  Result Date: 07/05/2021 CLINICAL DATA:  Fever EXAM: ULTRASOUND ABDOMEN LIMITED RIGHT UPPER QUADRANT COMPARISON:  05/26/2021 FINDINGS: Gallbladder: Sludge and stones noted within the gallbladder lumen. No significant gallbladder wall thickening allowing for under distension. No significant pericholecystic fluid. Sonographic Murphy sign negative per technologist. Common bile duct: Diameter: 6 mm Liver: No focal hepatic lesion. Mild nonspecific coarsening of parenchymal echogenicity. Portal vein is patent on color Doppler imaging with normal direction of blood flow towards the liver. Other: None. IMPRESSION: Redemonstration of cholelithiasis without definitive evidence of acute cholecystitis. Electronically Signed   By: Miachel Roux M.D.   On: 07/02/2021 12:06      Flora Lipps, MD  [facility] 06/23/2021  If 7PM-7AM,  please contact night-coverage

## 2021-06-23 NOTE — Consult Note (Signed)
Pharmacy Antibiotic Note  Veronica Anderson is a 75 y.o. female admitted on 06/16/2021 with sepsis.  Pharmacy has been consulted for Vancomycin and Cefepime dosing.  Plan: 1) Renal function improving. Changing to Vancomycin 1000 mg IV Q 24 hrs. Goal AUC 400-550. Expected AUC: 476.4 Expected Css: 11.3 SCr used: 1.05  2) Cefepime 2g IV Q12 hours   Height: 5\' 4"  (162.6 cm) Weight: 90 kg (198 lb 6.6 oz) IBW/kg (Calculated) : 54.7  Temp (24hrs), Avg:98.7 F (37.1 C), Min:96.6 F (35.9 C), Max:100.6 F (38.1 C)  Recent Labs  Lab 07/11/2021 1003 07/07/2021 1320 07/02/2021 1625 06/23/21 0420  WBC 33.7*  --   --  35.0*  CREATININE 1.22* 1.10*  --  1.05*  LATICACIDVEN 3.4* 4.2* 3.7*  --      Estimated Creatinine Clearance: 50.3 mL/min (A) (by C-G formula based on SCr of 1.05 mg/dL (H)).    Allergies  Allergen Reactions   Ace Inhibitors Cough    Antimicrobials this admission: Vancomycin 12/10 >>  Cefepime 12/10 >>   Microbiology results: 12/10 BCx: NG<24hrs 12/10 MRSA PCR: positive  Thank you for allowing pharmacy to be a part of this patient's care.  Saanya Zieske A Elisha Mcgruder 06/23/2021 9:27 AM

## 2021-06-23 NOTE — ED Notes (Signed)
Called Dietary at this time for pt's feeding supplement, pt states that they will send momentarily.

## 2021-06-23 NOTE — Progress Notes (Addendum)
Patient had multiple runs of non-sustained ventricular tachycardia with HR into the 150's. Patients 1630 CBG was 429.   Dr. Louanne Belton made aware of the above. Additional insulin given per MD orders. See MAR.

## 2021-06-24 DIAGNOSIS — R5381 Other malaise: Secondary | ICD-10-CM | POA: Diagnosis not present

## 2021-06-24 DIAGNOSIS — A419 Sepsis, unspecified organism: Secondary | ICD-10-CM | POA: Diagnosis not present

## 2021-06-24 DIAGNOSIS — J9601 Acute respiratory failure with hypoxia: Secondary | ICD-10-CM | POA: Diagnosis not present

## 2021-06-24 DIAGNOSIS — J189 Pneumonia, unspecified organism: Secondary | ICD-10-CM | POA: Diagnosis not present

## 2021-06-24 LAB — GLUCOSE, CAPILLARY
Glucose-Capillary: 100 mg/dL — ABNORMAL HIGH (ref 70–99)
Glucose-Capillary: 119 mg/dL — ABNORMAL HIGH (ref 70–99)
Glucose-Capillary: 225 mg/dL — ABNORMAL HIGH (ref 70–99)
Glucose-Capillary: 249 mg/dL — ABNORMAL HIGH (ref 70–99)
Glucose-Capillary: 313 mg/dL — ABNORMAL HIGH (ref 70–99)
Glucose-Capillary: 383 mg/dL — ABNORMAL HIGH (ref 70–99)
Glucose-Capillary: 389 mg/dL — ABNORMAL HIGH (ref 70–99)
Glucose-Capillary: 270 mg/dL — ABNORMAL HIGH (ref 70–99)

## 2021-06-24 LAB — COMPREHENSIVE METABOLIC PANEL
ALT: 11 U/L (ref 0–44)
AST: 13 U/L — ABNORMAL LOW (ref 15–41)
Albumin: 1.9 g/dL — ABNORMAL LOW (ref 3.5–5.0)
Alkaline Phosphatase: 67 U/L (ref 38–126)
Anion gap: 8 (ref 5–15)
BUN: 62 mg/dL — ABNORMAL HIGH (ref 8–23)
CO2: 27 mmol/L (ref 22–32)
Calcium: 8.5 mg/dL — ABNORMAL LOW (ref 8.9–10.3)
Chloride: 103 mmol/L (ref 98–111)
Creatinine, Ser: 1.12 mg/dL — ABNORMAL HIGH (ref 0.44–1.00)
GFR, Estimated: 51 mL/min — ABNORMAL LOW (ref >60)
Glucose, Bld: 126 mg/dL — ABNORMAL HIGH (ref 70–99)
Potassium: 5.5 mmol/L — ABNORMAL HIGH (ref 3.5–5.1)
Sodium: 138 mmol/L (ref 135–145)
Total Bilirubin: 0.8 mg/dL (ref 0.3–1.2)
Total Protein: 6.4 g/dL — ABNORMAL LOW (ref 6.5–8.1)

## 2021-06-24 LAB — CBC
HCT: 25 % — ABNORMAL LOW (ref 36.0–46.0)
Hemoglobin: 7.6 g/dL — ABNORMAL LOW (ref 12.0–15.0)
MCH: 25.4 pg — ABNORMAL LOW (ref 26.0–34.0)
MCHC: 30.4 g/dL (ref 30.0–36.0)
MCV: 83.6 fL (ref 80.0–100.0)
Platelets: 310 10*3/uL (ref 150–400)
RBC: 2.99 MIL/uL — ABNORMAL LOW (ref 3.87–5.11)
RDW: 18.2 % — ABNORMAL HIGH (ref 11.5–15.5)
WBC: 28.5 10*3/uL — ABNORMAL HIGH (ref 4.0–10.5)
nRBC: 0 % (ref 0.0–0.2)

## 2021-06-24 LAB — MAGNESIUM: Magnesium: 1.9 mg/dL (ref 1.7–2.4)

## 2021-06-24 LAB — POTASSIUM: Potassium: 5 mmol/L (ref 3.5–5.1)

## 2021-06-24 LAB — PHOSPHORUS: Phosphorus: 3.2 mg/dL (ref 2.5–4.6)

## 2021-06-24 MED ORDER — SODIUM CHLORIDE 0.9 % IV SOLN
2.0000 g | Freq: Two times a day (BID) | INTRAVENOUS | Status: DC
  Administered 2021-06-24 – 2021-06-25 (×2): 2 g via INTRAVENOUS
  Filled 2021-06-24 (×4): qty 2

## 2021-06-24 MED ORDER — OSMOLITE 1.5 CAL PO LIQD
237.0000 mL | Freq: Every day | ORAL | Status: DC
  Administered 2021-06-24 – 2021-06-25 (×9): 237 mL

## 2021-06-24 MED ORDER — OSMOLITE 1.5 CAL PO LIQD: 1000.0000 mL | Freq: Every day | ORAL | Status: DC

## 2021-06-24 MED ORDER — FREE WATER
120.0000 mL | Freq: Every day | Status: DC
  Administered 2021-06-24 – 2021-06-25 (×9): 120 mL

## 2021-06-24 MED ORDER — INSULIN ASPART 100 UNIT/ML IJ SOLN
10.0000 [IU] | Freq: Once | INTRAMUSCULAR | Status: AC
  Administered 2021-06-24: 10 [IU] via SUBCUTANEOUS

## 2021-06-24 MED ORDER — ALBUMIN HUMAN 25 % IV SOLN
25.0000 g | Freq: Four times a day (QID) | INTRAVENOUS | Status: AC
  Administered 2021-06-24: 25 g via INTRAVENOUS
  Administered 2021-06-25: 12.5 g via INTRAVENOUS
  Administered 2021-06-25 (×2): 25 g via INTRAVENOUS
  Filled 2021-06-24 (×4): qty 100

## 2021-06-24 MED ORDER — VANCOMYCIN HCL IN DEXTROSE 1-5 GM/200ML-% IV SOLN
1000.0000 mg | INTRAVENOUS | Status: DC
  Administered 2021-06-24 – 2021-06-25 (×2): 1000 mg via INTRAVENOUS
  Filled 2021-06-24 (×2): qty 200

## 2021-06-24 MED ORDER — SODIUM CHLORIDE 0.9 % IV SOLN
2.0000 g | INTRAVENOUS | Status: DC
  Administered 2021-06-24: 2 g via INTRAVENOUS
  Filled 2021-06-24: qty 2

## 2021-06-24 MED ORDER — INSULIN ASPART 100 UNIT/ML IJ SOLN
3.0000 [IU] | Freq: Every day | INTRAMUSCULAR | Status: DC
  Administered 2021-06-25 (×5): 3 [IU] via SUBCUTANEOUS
  Filled 2021-06-24 (×5): qty 1

## 2021-06-24 MED ORDER — CHLORHEXIDINE GLUCONATE CLOTH 2 % EX PADS
6.0000 | MEDICATED_PAD | Freq: Every day | CUTANEOUS | Status: DC
  Administered 2021-06-25: 6 via TOPICAL

## 2021-06-24 NOTE — Progress Notes (Signed)
Initial Nutrition Assessment  DOCUMENTATION CODES:   Non-severe (moderate) malnutrition in context of chronic illness  INTERVENTION:   Osmolite 1.6- Give six cartons daily via tube- Flush with 40m of water before and after each feed.   Regimen provides 2130kcal/day, 90g/day protein and 18036mday free water   Juven Fruit Punch BID via tube, each serving provides 95kcal and 2.5g of protein (amino acids glutamine and arginine)  NUTRITION DIAGNOSIS:   Moderate Malnutrition related to chronic illness (CVA, DM, CKD) as evidenced by mild fat depletion, moderate fat depletion, moderate muscle depletion, severe muscle depletion, 7 percent weight loss in 1 month.  GOAL:   Patient will meet greater than or equal to 90% of their needs  MONITOR:   Labs, Weight trends, TF tolerance, Skin, I & O's  REASON FOR ASSESSMENT:   Other (Comment) (home tube feeder)    ASSESSMENT:   [age]/o female with h/o CVA, HTN, CHF, DM, HLD, GERD, CKD III, OSA and recent admission for aspiration PNA requiring IR G-tube who is admitted with CAP.  Pt s/p IR 1F G-tube placement 11/17  Pt is well known to this RD from her recent previous admission. Met with pt and pt's son in room today. Pt unable to provide any nutrition related history so this was obtained from pt's son at bedside. Pt with chronic G-tube and receives Osmolite 1.5- 6 cartons daily via her tube along with 12052mf water with each feed. Son reports that pt has been tolerating her tube feeds well at home. Per chart, pt is down 14lbs(7%) over the past month; this is significant weight loss. Spoke with [facility] has been receiving Osmolite 1.5 regularly from company; RD unsure of why pt has lost weight. Weight loss may be related to fluid changes. Will resume pt's home tube feed regimen. Per RN, pt's sacral wounds are almost healed; will continue Juven.   Medications reviewed and include: lovenox, ferrous sulfate, insulin, juven, protonix,  cefepime, vancomycin   Labs reviewed: K 5.5(H), BUN 62(H), creat 1.12(H), P 3.2 wnl, Mg 1.9 wnl Wbc- 28.5(H), Hgb 7.6(L), Hct 25.0(L) Cbgs- 225, 100, 119 x 24 hrs AIC 6.6(H)- 11/12  NUTRITION - FOCUSED PHYSICAL EXAM:  Flowsheet Row Most Recent Value  Orbital Region No depletion  Upper Arm Region Moderate depletion  Thoracic and Lumbar Region Mild depletion  Buccal Region No depletion  Temple Region No depletion  Clavicle Bone Region No depletion  Clavicle and Acromion Bone Region No depletion  Scapular Bone Region Moderate depletion  Dorsal Hand Moderate depletion  Patellar Region Moderate depletion  Anterior Thigh Region Moderate depletion  Posterior Calf Region Severe depletion  Edema (RD Assessment) None  Hair Reviewed  Eyes Reviewed  Mouth Reviewed  Skin Reviewed  Nails Reviewed   Diet Order:   Diet Order             Diet NPO time specified  Diet effective now                  EDUCATION NEEDS:   No education needs have been identified at this time  Skin:  Skin Assessment: Reviewed RN Assessment (Skin breakdown sacrum)  Last BM:  12/10  Height:   Ht Readings from Last 1 Encounters:  06/25/2021 _0  (1.626 m)    Weight:   Wt Readings from Last 1 Encounters:  06/23/21 81.9 kg    Ideal Body Weight:  54.5 kg  BMI:  Body mass index is 30.99 kg/m.  Estimated Nutritional  Needs:   Kcal:  1700-2000kcal/day  Protein:  85-100g/day  Fluid:  1.4-1.6L/day  Koleen Distance MS, RD, LDN Please refer to [facility] for RD and/or RD on-call/weekend/after hours pager

## 2021-06-24 NOTE — Progress Notes (Signed)
PROGRESS NOTE  Veronica Anderson DBZ:[id number] DOB: 04/13/1946 DOA: 07/04/2021 PCP: Tracie Harrier, MD   LOS: 2 days   Brief narrative: Veronica Anderson is a 75 years old female with past medical history of CHF, stage IV CKD, type 2 diabetes, hypertension, dyslipidemia and peripheral neuropathy with sleep apnea on CPAP presented to hospital with altered mental status with cough and wheezing.  Initially patient was noted to be hypoxic with pulse ox of 84% on room air which improved to 94% on 50% FiO2 Ventimask.  Of note, patient was recently admitted to the hospital for aspiration pneumonia and sepsis and was discharged on 06/02/2021.  On this presentation patient had a fever of 100.6 F with borderline low blood pressure at 91/54 with mild tachypnea and tachycardia.  Initial labs showed potassium of 5.3 with glucose of 432. high-sensitivity troponin I was 23.  CBC showed significant leukocytosis of 33.7 with neutrophilia and macrocytosis as well as anemia.  Influenza antigens and COVID-19 PCR came back negative.  Blood cultures were drawn.  X-ray showed a persistent right basilar opacity suspicious for pneumonia.  Right upper quadrant ultrasound showed cholelithiasis without any evidence of cholecystitis.  In the ED, patient was given IV fluids antibiotic with vancomycin cefepime and Flagyl and patient was then  was admitted to the hospital for further evaluation and treatment.  Assessment/Plan:  Principal Problem:   Sepsis due to pneumonia ([place])  Sepsis secondary to right basal pneumonia with acute hypoxic respiratory failure. Patient had signs and symptoms of sepsis as evidenced by fever, tachycardia, tachypnea and significant leukocytosis with elevated lactate.  Patient was initially on IV cefepime, vancomycin and Flagyl.  Blood cultures showing gram-positive rods in aerobic bottles.  We will continue with broad-spectrum antibiotics since the patient is very sick.  On BiPAP continuously since  yesterday.  Opening eyes only today.  Not much of improvement.  We will get pulmonary consultation since the patient is full code.   Uncontrolled type 2 diabetes mellitus with hyperglycemia and peripheral neuropathy. Continue sliding scale insulin, Accu-Cheks, diabetic diet.  Latest POC glucose of 249.   Dyslipidemia. Continue pravastatin via PEG tube..  Essential hypertension. On clonidine patch.  Patient takes Lasix 40 daily at home.  We will continue to hold Lasix for now.  Borderline hyperkalemia.  We will continue to monitor.  Obstructive sleep apnea. Will likely need CPAP new prescription on discharge since she has older CPAP and has not been changed in several years now as per the  patient's son...  Continue BiPAP while in the hospital  Debility, deconditioning.  PT evaluation when patient is stable and more mentally alert.  Has diffuse flaccidity..  Ethics goals of care.  Pending palliative care consultation.  Patient's son states full code at this time.  Of note patient was on hospice in the last discharge home.  DVT prophylaxis: enoxaparin (LOVENOX) injection 40 mg Start: 07/10/2021 2200  Code Status: Full code    Family Communication:  I again spoke with the patient's son and sister at bedside.  Discussed about poor prognosis in this situation with encephalopathy, respiratory failure.  Indicated that intubation might be the neck step but might be more detrimental to the patient especially with extreme deconditioning, diffuse pneumonia with recent hospitalization.  Status is: Inpatient  Remains inpatient appropriate because: Acute hypoxic respiratory failure on BiPAP, metabolic encephalopathy, sepsis, pneumonia  Consultants: Palliative care pending PCCM.  Procedures: BiPAP  Anti-infectives:  Vancomycin and cefepime   Subjective: Today, patient was seen and examined  at bedside.  On BiPAP throughout the night.  Not following commands, opens eyes spontaneously.   Patient's sister at bedside  Objective: Vitals:   06/24/21 0400 06/24/21 0746  BP: (!) 116/50   Pulse: 89 (!) 101  Resp: 16 (!) 22  Temp: 98.4 F (36.9 C)   SpO2: 97% 97%    Intake/Output Summary (Last 24 hours) at 06/24/2021 0813 Last data filed at 06/24/2021 0600 Gross per 24 hour  Intake --  Output 400 ml  Net -400 ml    Filed Weights   06/13/2021 0949 06/23/21 0931  Weight: 90 kg 81.9 kg   Body mass index is 30.99 kg/m.   Physical Exam:  GENERAL: Not following commands, opens eyes spontaneously, obese built, on BiPAP, ill-appearing and deconditioned. HENT: No scleral pallor or icterus. Oral mucosa is moist NECK: is supple, no gross swelling noted. CHEST: Coarse breath sounds noted, CVS: S1 and S2 heard, no murmur. Regular rate and rhythm.  ABDOMEN: Soft, non-tender, bowel sounds are present.  PEG tube in place. EXTREMITIES: Flaccidity on all the limbs. CNS flaccidity on the limbs SKIN: warm and dry without rashes.  Data Review: I have personally reviewed the following laboratory data and studies,  CBC: Recent Labs  Lab 06/23/2021 1003 06/23/21 0420 06/24/21 0520  WBC 33.7* 35.0* 28.5*  NEUTROABS 29.0*  --   --   HGB 10.3* 9.6* 7.6*  HCT 34.5* 31.7* 25.0*  MCV 86.3 85.0 83.6  PLT 415* 458* 361    Basic Metabolic Panel: Recent Labs  Lab 06/24/2021 1003 06/30/2021 1320 06/23/21 0420 06/23/21 1656 06/24/21 0520  NA 138 137 138  --  138  K 6.5* 5.3* 4.5  --  5.5*  CL 100 102 103  --  103  CO2 25 22 25   --  27  GLUCOSE 384* 432* 91 438* 126*  BUN 50* 48* 52*  --  62*  CREATININE 1.22* 1.10* 1.05*  --  1.12*  CALCIUM 9.1 8.5* 8.7*  --  8.5*  MG 2.1  --   --   --  1.9  PHOS 4.1  --   --   --  3.2    Liver Function Tests: Recent Labs  Lab 07/13/2021 1003 06/23/21 0420 06/24/21 0520  AST 18 13* 13*  ALT 10 12 11   ALKPHOS 75 76 67  BILITOT 1.0 0.7 0.8  PROT 7.5 6.8 6.4*  ALBUMIN 2.8* 2.5* 1.9*    No results for input(s): LIPASE, AMYLASE in the  last 168 hours. Recent Labs  Lab 06/15/2021 1320  AMMONIA 15    Cardiac Enzymes: Recent Labs  Lab 06/13/2021 1003  CKTOTAL 27*    BNP (last 3 results) Recent Labs    05/25/21 1310 06/30/2021 1003  BNP 103.1* 800.2*     ProBNP (last 3 results) No results for input(s): PROBNP in the last 8760 hours.  CBG: Recent Labs  Lab 06/23/21 1628 06/23/21 2039 06/24/21 0053 06/24/21 0408 06/24/21 0736  GLUCAP 429* 236* 119* 100* 225*    Recent Results (from the past 240 hour(s))  Blood culture (routine x 2)     Status: None (Preliminary result)   Collection Time: 06/30/2021 10:03 AM   Specimen: BLOOD  Result Value Ref Range Status   Specimen Description   Final    BLOOD BLOOD LEFT HAND Performed at [facility], [address] [place], [place] [address]    Special Requests   Final    BOTTLES DRAWN AEROBIC AND ANAEROBIC Blood Culture  results may not be optimal due to an inadequate volume of blood received in culture bottles Performed at [facility], [address] [place], [place] [address]    Culture  Setup Time   Final    Organism ID to follow [place] CRITICAL RESULT CALLED TO, READ BACK BY AND VERIFIED WITH: MORGAN HICKS AT 2005 06/23/2021 GAA    Culture GRAM POSITIVE RODS  Final   Report Status PENDING  Incomplete  Blood culture (routine x 2)     Status: None (Preliminary result)   Collection Time: 06/29/2021 10:03 AM   Specimen: BLOOD  Result Value Ref Range Status   Specimen Description BLOOD BLOOD LEFT FOREARM  Final   Special Requests   Final    BOTTLES DRAWN AEROBIC AND ANAEROBIC Blood Culture results may not be optimal due to an inadequate volume of blood received in culture bottles   Culture   Final    NO GROWTH 2 DAYS Performed at [facility], [address] [place], [place] [address]    Report Status PENDING  Incomplete  Blood Culture ID Panel (Reflexed)     Status: None   Collection Time:  06/27/2021 10:03 AM  Result Value Ref Range Status   Enterococcus faecalis NOT DETECTED NOT DETECTED Final   Enterococcus Faecium NOT DETECTED NOT DETECTED Final   Listeria monocytogenes NOT DETECTED NOT DETECTED Final    Comment: GRAM STAIN RESULT READ BACK BY AND VERIFIED WITH MORGAN HICKS AT 2005 06/23/2021 GAA   Staphylococcus species NOT DETECTED NOT DETECTED Final   Staphylococcus aureus (BCID) NOT DETECTED NOT DETECTED Final   Staphylococcus epidermidis NOT DETECTED NOT DETECTED Final   Staphylococcus lugdunensis NOT DETECTED NOT DETECTED Final   Streptococcus species NOT DETECTED NOT DETECTED Final   Streptococcus agalactiae NOT DETECTED NOT DETECTED Final   Streptococcus pneumoniae NOT DETECTED NOT DETECTED Final   Streptococcus pyogenes NOT DETECTED NOT DETECTED Final   A.calcoaceticus-baumannii NOT DETECTED NOT DETECTED Final   Bacteroides fragilis NOT DETECTED NOT DETECTED Final   Enterobacterales NOT DETECTED NOT DETECTED Final   Enterobacter cloacae complex NOT DETECTED NOT DETECTED Final   Escherichia coli NOT DETECTED NOT DETECTED Final   Klebsiella aerogenes NOT DETECTED NOT DETECTED Final   Klebsiella oxytoca NOT DETECTED NOT DETECTED Final   Klebsiella pneumoniae NOT DETECTED NOT DETECTED Final   Proteus species NOT DETECTED NOT DETECTED Final   Salmonella species NOT DETECTED NOT DETECTED Final   Serratia marcescens NOT DETECTED NOT DETECTED Final   Haemophilus influenzae NOT DETECTED NOT DETECTED Final   Neisseria meningitidis NOT DETECTED NOT DETECTED Final   Pseudomonas aeruginosa NOT DETECTED NOT DETECTED Final   Stenotrophomonas maltophilia NOT DETECTED NOT DETECTED Final   Candida albicans NOT DETECTED NOT DETECTED Final   Candida auris NOT DETECTED NOT DETECTED Final   Candida glabrata NOT DETECTED NOT DETECTED Final   Candida krusei NOT DETECTED NOT DETECTED Final   Candida parapsilosis NOT DETECTED NOT DETECTED Final   Candida tropicalis NOT DETECTED NOT  DETECTED Final   Cryptococcus neoformans/gattii NOT DETECTED NOT DETECTED Final    Comment: Performed at [facility], [address] [place],  [address]  Resp Panel by RT-PCR (Flu A&B, Covid) Nasopharyngeal Swab     Status: None   Collection Time: 06/30/2021 10:34 AM   Specimen: Nasopharyngeal Swab; Nasopharyngeal(NP) swabs in vial transport medium  Result Value Ref Range Status   SARS Coronavirus 2 by RT PCR NEGATIVE NEGATIVE Final  Comment: (NOTE) SARS-CoV-2 target nucleic acids are NOT DETECTED.  The SARS-CoV-2 RNA is generally detectable in upper respiratory specimens during the acute phase of infection. The lowest concentration of SARS-CoV-2 viral copies this assay can detect is 138 copies/mL. A negative result does not preclude SARS-Cov-2 infection and should not be used as the sole basis for treatment or other patient management decisions. A negative result may occur with  improper specimen collection/handling, submission of specimen other than nasopharyngeal swab, presence of viral mutation(s) within the areas targeted by this assay, and inadequate number of viral copies(<138 copies/mL). A negative result must be combined with clinical observations, patient history, and epidemiological information. The expected result is Negative.  Fact Sheet for Patients:  EntrepreneurPulse.com.au  Fact Sheet for Healthcare Providers:  IncredibleEmployment.be  This test is no t yet approved or cleared by the [place] FDA and  has been authorized for detection and/or diagnosis of SARS-CoV-2 by FDA under an Emergency Use Authorization (EUA). This EUA will remain  in effect (meaning this test can be used) for the duration of the COVID-19 declaration under Section 564(b)(1) of the Act, 21 U.S.C.section 360bbb-3(b)(1), unless the authorization is terminated  or revoked sooner.       Influenza A by PCR NEGATIVE NEGATIVE Final    Influenza B by PCR NEGATIVE NEGATIVE Final    Comment: (NOTE) The Xpert Xpress SARS-CoV-2/FLU/RSV plus assay is intended as an aid in the diagnosis of influenza from Nasopharyngeal swab specimens and should not be used as a sole basis for treatment. Nasal washings and aspirates are unacceptable for Xpert Xpress SARS-CoV-2/FLU/RSV testing.  Fact Sheet for Patients: EntrepreneurPulse.com.au  Fact Sheet for Healthcare Providers: IncredibleEmployment.be  This test is not yet approved or cleared by the [place] FDA and has been authorized for detection and/or diagnosis of SARS-CoV-2 by FDA under an Emergency Use Authorization (EUA). This EUA will remain in effect (meaning this test can be used) for the duration of the COVID-19 declaration under Section 564(b)(1) of the Act, 21 U.S.C. section 360bbb-3(b)(1), unless the authorization is terminated or revoked.  Performed at [facility], [address] [place], [place] [address]   MRSA Next Gen by PCR, Nasal     Status: Abnormal   Collection Time: 06/19/2021  4:11 PM   Specimen: Nasal Mucosa; Nasal Swab  Result Value Ref Range Status   MRSA by PCR Next Gen DETECTED (A) NOT DETECTED Final    Comment: RESULT CALLED TO, READ BACK BY AND VERIFIED WITH: LINDSAY BLACK 06/15/2021 @ 1742 BY SB (NOTE) The GeneXpert MRSA Assay (FDA approved for NASAL specimens only), is one component of a comprehensive MRSA colonization surveillance program. It is not intended to diagnose MRSA infection nor to guide or monitor treatment for MRSA infections. Test performance is not FDA approved in patients less than 91 years old. Performed at [facility], [address] [place], [place] [address]       Studies: CT HEAD WO CONTRAST (5MM)  Result Date: 06/15/2021 CLINICAL DATA:  Head trauma.  Sepsis.  Altered mental status. EXAM: CT HEAD WITHOUT CONTRAST TECHNIQUE: Contiguous axial images were  obtained from the base of the skull through the vertex without intravenous contrast. COMPARISON:  05/25/2021 FINDINGS: Brain: No evidence of acute infarction, hemorrhage, hydrocephalus, extra-axial collection or mass lesion/mass effect. Chronic right cerebellar infarct. Patchy low-density changes within the periventricular and subcortical white matter compatible with chronic microvascular ischemic change. Moderate diffuse cerebral volume loss. Vascular: Atherosclerotic calcifications involving the large vessels  of the skull base. No unexpected hyperdense vessel. Skull: Normal. Negative for fracture or focal lesion. Sinuses/Orbits: Bilateral mastoid effusions, left worse than right. Left maxillary sinus mucosal thickening. Other: None. IMPRESSION: 1. No acute intracranial findings. 2. Chronic microvascular ischemic change and cerebral volume loss. 3. Bilateral mastoid effusions, left worse than right. Electronically Signed   By: Davina Poke D.O.   On: 07/01/2021 14:25   DG Chest Portable 1 View  Result Date: 06/13/2021 CLINICAL DATA:  Shortness of breath EXAM: PORTABLE CHEST 1 VIEW COMPARISON:  05/28/2021 FINDINGS: Persistent right basilar opacity. No significant pleural effusion. No pneumothorax. Stable cardiomediastinal contours. IMPRESSION: Persistent right basilar opacity suspicious for pneumonia. Electronically Signed   By: Macy Mis M.D.   On: 07/12/2021 11:04   [place] EKG SITE RITE  Result Date: 06/25/2021 If Site Rite image not attached, placement could not be confirmed due to current cardiac rhythm.  CT CHEST ABDOMEN PELVIS WO CONTRAST  Result Date: 07/01/2021 CLINICAL DATA:  Possible sepsis. Patient discharge from hospital last week with pneumonia. Oxygen dependent. EXAM: CT CHEST, ABDOMEN AND PELVIS WITHOUT CONTRAST TECHNIQUE: Multidetector CT imaging of the chest, abdomen and pelvis was performed following the standard protocol without IV contrast. COMPARISON:  05/25/2021 FINDINGS: CT  CHEST FINDINGS Cardiovascular: Heart is normal in size. Calcified plaque over the left main and 3 vessel coronary arteries. Thoracic aorta is normal in caliber. There is calcified plaque over the thoracic aorta. Remaining vascular structures are unremarkable. Mediastinum/Nodes: No mediastinal or hilar adenopathy visualized on this noncontrast study. Remaining mediastinal structures are unremarkable. Mild prominence of the proximal to mid esophagus containing ingested material which may be due to dysmotility versus reflux. Lungs/Pleura: Lungs are adequately inflated demonstrate interval worsening of consolidation over the right lower lobe. Interval development of right lower lobe bronchial obstruction beginning over the right mainstem bronchus. Subtle hazy nodular airspace opacification over the right upper lobe. Interval development/worsening of patchy opacification over the posterior left lower lobe. Musculoskeletal: No focal abnormality. CT ABDOMEN PELVIS FINDINGS Hepatobiliary: Moderate cholelithiasis. Liver and biliary tree are normal. Pancreas: Unremarkable.  Mild fatty atrophy. Spleen: Normal. Adrenals/Urinary Tract: Adrenal glands are normal. Kidneys are normal in size without hydronephrosis. Punctate nonobstructing stone over the lower pole right kidney. Ureters and bladder are unremarkable. Stomach/Bowel: Percutaneous gastrostomy tube in adequate position. Small bowel is normal. Appendix is normal. Mild diverticulosis of the colon. The descending colon is decompressed. Mild fecal retention over the rectosigmoid colon. Vascular/Lymphatic: Calcified plaque over the abdominal aorta which is normal in caliber. No adenopathy. Reproductive: Moderately enlarged uterus with multiple calcified fibroids as the largest fibroid measures proximally 5.7 cm. Ovaries unremarkable. Other: No significant free fluid or focal inflammatory change. Musculoskeletal: No focal abnormality. IMPRESSION: 1. Interval worsening of  consolidation over the right lower lobe with new right lower lobe bronchial obstruction beginning at the right mainstem bronchus. This is likely mostly related to postobstructive atelectasis as concomitant infection is possible. Subtle hazy nodular airspace opacification over the right upper lobe and posterior left lower lobe suggesting an infectious process and less likely inflammatory disease. 2. Moderate cholelithiasis. 3. Ingested material within the proximal to mid esophagus which may be due to dysmotility or reflux. 4. Punctate nonobstructing right renal stone. 5. Mild colonic diverticulosis. 6. Enlarged fibroid uterus. 7. Aortic atherosclerosis. Atherosclerotic coronary artery disease. Aortic Atherosclerosis (ICD10-I70.0). Electronically Signed   By: Marin Olp M.D.   On: 06/27/2021 14:38   US ABDOMEN LIMITED RUQ (LIVER/GB)  Result Date: 06/14/2021 CLINICAL DATA:  Fever  EXAM: ULTRASOUND ABDOMEN LIMITED RIGHT UPPER QUADRANT COMPARISON:  05/26/2021 FINDINGS: Gallbladder: Sludge and stones noted within the gallbladder lumen. No significant gallbladder wall thickening allowing for under distension. No significant pericholecystic fluid. Sonographic Murphy sign negative per technologist. Common bile duct: Diameter: 6 mm Liver: No focal hepatic lesion. Mild nonspecific coarsening of parenchymal echogenicity. Portal vein is patent on color Doppler imaging with normal direction of blood flow towards the liver. Other: None. IMPRESSION: Redemonstration of cholelithiasis without definitive evidence of acute cholecystitis. Electronically Signed   By: Miachel Roux M.D.   On: 06/27/2021 12:06      Flora Lipps, MD  [facility] 06/24/2021  If 7PM-7AM, please contact night-coverage

## 2021-06-24 NOTE — Progress Notes (Signed)
PHARMACY - PHYSICIAN COMMUNICATION CRITICAL VALUE ALERT - BLOOD CULTURE IDENTIFICATION (BCID)  Tambra Muller is an 75 y.o. female who presented to [facility] on 06/24/2021 with a chief complaint of sepsis, PNA   Assessment:  Strep pneumo in 4 of 4 bottles, no resistance  (include suspected source if known)  Name of physician (or Provider) Contacted: Mansy  Current antibiotics: Vanc, Cefepime , metronidazole   Changes to prescribed antibiotics recommended:  Will d/c current abx and start ceftriaxone 2 gm IV Q24H   Results for orders placed or performed during the hospital encounter of 06/25/2021  Blood Culture ID Panel (Reflexed) (Collected: 07/05/2021 10:03 AM)  Result Value Ref Range   Enterococcus faecalis NOT DETECTED NOT DETECTED   Enterococcus Faecium NOT DETECTED NOT DETECTED   Listeria monocytogenes NOT DETECTED NOT DETECTED   Staphylococcus species NOT DETECTED NOT DETECTED   Staphylococcus aureus (BCID) NOT DETECTED NOT DETECTED   Staphylococcus epidermidis NOT DETECTED NOT DETECTED   Staphylococcus lugdunensis NOT DETECTED NOT DETECTED   Streptococcus species NOT DETECTED NOT DETECTED   Streptococcus agalactiae NOT DETECTED NOT DETECTED   Streptococcus pneumoniae NOT DETECTED NOT DETECTED   Streptococcus pyogenes NOT DETECTED NOT DETECTED   A.calcoaceticus-baumannii NOT DETECTED NOT DETECTED   Bacteroides fragilis NOT DETECTED NOT DETECTED   Enterobacterales NOT DETECTED NOT DETECTED   Enterobacter cloacae complex NOT DETECTED NOT DETECTED   Escherichia coli NOT DETECTED NOT DETECTED   Klebsiella aerogenes NOT DETECTED NOT DETECTED   Klebsiella oxytoca NOT DETECTED NOT DETECTED   Klebsiella pneumoniae NOT DETECTED NOT DETECTED   Proteus species NOT DETECTED NOT DETECTED   Salmonella species NOT DETECTED NOT DETECTED   Serratia marcescens NOT DETECTED NOT DETECTED   Haemophilus influenzae NOT DETECTED NOT DETECTED   Neisseria meningitidis NOT DETECTED NOT DETECTED    Pseudomonas aeruginosa NOT DETECTED NOT DETECTED   Stenotrophomonas maltophilia NOT DETECTED NOT DETECTED   Candida albicans NOT DETECTED NOT DETECTED   Candida auris NOT DETECTED NOT DETECTED   Candida glabrata NOT DETECTED NOT DETECTED   Candida krusei NOT DETECTED NOT DETECTED   Candida parapsilosis NOT DETECTED NOT DETECTED   Candida tropicalis NOT DETECTED NOT DETECTED   Cryptococcus neoformans/gattii NOT DETECTED NOT DETECTED    Tycen Dockter D 06/24/2021  1:09 AM

## 2021-06-24 NOTE — Progress Notes (Addendum)
GOALS OF CARE DISCUSSION  The Clinical status was relayed to family in detail. Son at Bedside Updated and notified of patients medical condition.    Patient remains unresponsive and will not open eyes to command.   Patient is having a weak cough and struggling to remove secretions.   Patient with increased WOB and using accessory muscles to breathe Explained to family course of therapy and the modalities  On BiPAP  Patient with Progressive multiorgan failure with a very high probablity of a very minimal chance of meaningful recovery despite all aggressive and optimal medical therapy.  PATIENT REMAINS FULL CODE  Family understands the situation.   Family are satisfied with Plan of action and management. All questions answered  Additional CC time 31 mins   Tiwan Schnitker Patricia Pesa, M.D.  [facility]  Medical Director [place] Director [facility] Cardio-Pulmonary Department

## 2021-06-24 NOTE — TOC Initial Note (Signed)
Transition of Care [facility]) - Initial/Assessment Note    Patient Details  Name: Veronica Anderson MRN: [id number] Date of Birth: 09-24-1945  [facility]) CM/SW Contact:    Shelbie Hutching, RN Phone Number: 06/24/2021, 4:30 PM  Clinical Narrative:                  Patient admitted to the hospital with sepsis due to pneumonia, patient was on Bipap this morning and HFNC now.  Patient's son was at the bedside this morning and RNCM met with patient and son at the bedside.  Patient lives with son, she was active with Hospice, son thinks [facility].  He no longer wants Hospice and wants patient to return home with home health.  [place] referral given to Sarah with [facility] for RN, PT and OT, she will submit to see if they can accept.    Patient has all needed DME- hospital bed, hoyer lift, wheelchair, and CPAP (75 years old).  RNCM will follow up with [employer] to see if patient can get a new CPAP.    TOC will cont to follow and assist with disposition.   Expected Discharge Plan: [place] Barriers to Discharge: Continued Medical Work up   Patient Goals and CMS Choice Patient states their goals for this hospitalization and ongoing recovery are:: Patient's son would like for her to get better and return home with home health services. CMS Medicare.gov Compare Post Acute Care list provided to:: Patient Represenative (must comment) Choice offered to / list presented to : Adult Children  Expected Discharge Plan and Services Expected Discharge Plan: [place]   Discharge Planning Services: CM Consult Post Acute Care Choice: [place] arrangements for the past 2 months: Single Family Home                 DME Arranged: N/A DME Agency: NA       HH Arranged: RN, PT, OT HH Agency: [facility] Date [place]: 06/24/21 Time [place]: 1628 Representative spoke with at [place]: Rema Jasmine  Prior Living  Arrangements/Services Living arrangements for the past 2 months: [place] with:: Adult Children Patient language and need for interpreter reviewed:: Yes Do you feel safe going back to the place where you live?: Yes      Need for Family Participation in Patient Care: Yes (Comment) Care giver support system in place?: Yes (comment) (son) Current home services: DME (hospital bed, hoyer lift, lift chair, wheelchcair, CPAP) Criminal Activity/Legal Involvement Pertinent to Current Situation/Hospitalization: No - Comment as needed  Activities of Daily Living      Permission Sought/Granted Permission sought to share information with : Case Manager, Family Supports, Customer service manager Permission granted to share information with : Yes, Verbal Permission Granted  Share Information with NAME: Joane Postel  Permission granted to share info w AGENCY: [place] agency  Permission granted to share info w Relationship: son  Permission granted to share info w Contact Information: [phone]  Emotional Assessment Appearance:: Appears stated age Attitude/Demeanor/Rapport: Unable to Assess Affect (typically observed): Unable to Assess Orientation: : Fluctuating Orientation (Suspected and/or reported Sundowners) Alcohol / Substance Use: Not Applicable Psych Involvement: No (comment)  Admission diagnosis:  Fever [R50.9] Postobstructive pneumonia [J18.9] Septic shock ([place]) [A41.9, R65.21] Sepsis due to pneumonia ([place]) [J18.9, A41.9] Patient Active Problem List   Diagnosis Date Noted   Sepsis due to pneumonia ([place]) 06/15/2021   Hypokalemia  06/01/2021   Hypophosphatemia 06/01/2021   Hypernatremia 06/01/2021   Thrombocytopenia ([place]) 05/31/2021   Severe sepsis with septic shock ([place]) 05/28/2021   Malnutrition of moderate degree 05/27/2021   Pressure ulcer, sacrum 05/26/2021    Class: Stage 2   Aspiration pneumonia of right lower lobe (HCC)    Acute respiratory  failure with hypoxia ([place]) 05/25/2021   Acute respiratory failure with hypoxemia (HCC) 06/28/2018   Post-menopausal bleeding 11/18/2017   UTI (urinary tract infection) 09/11/2017   Pressure injury of skin 09/11/2017   Chronic kidney disease (CKD), stage IV (severe) (HCC) 07/29/2017   Mild protein-calorie malnutrition ([place]) 07/29/2017   Anemia due to chronic kidney disease    Prolonged Q-T interval on ECG 12/23/2016   CHF (congestive heart failure), NYHA class I ([place]) 11/07/2016   Nocturnal oxygen desaturation 06/26/2015   Anemia in chronic illness 02/23/2015   Asthma, mild intermittent 02/23/2015   Benign essential HTN 63/87/5643   Diastolic heart failure, NYHA class 3 ([place]) 02/23/2015   Benign hypertension with chronic kidney disease, stage III ([place]) 02/23/2015   CN (constipation) 02/23/2015   Diabetes mellitus with neuropathy ([place]) 02/23/2015   Dyslipidemia 02/23/2015   Elevated ferritin 02/23/2015   Gastro-esophageal reflux disease without esophagitis 02/23/2015   LBP (low back pain) 02/23/2015   Morbid obesity due to excess calories ([place]) 02/23/2015   Obstructive apnea 02/23/2015   Osteoarthritis 02/23/2015   Neuropathy 02/23/2015   Perennial allergic rhinitis with seasonal variation 02/23/2015   History of pneumonia 02/23/2015   Vitamin D deficiency 02/23/2015   Cataract 04/16/2010   Background diabetic retinopathy ([place]) 12/18/2009   PCP:  Tracie Harrier, MD Pharmacy:   [id number] [place] [place] [id number]: [phone]: [phone]    Social Determinants of Health (SDOH) Interventions    Readmission Risk Interventions Readmission Risk Prevention Plan 06/24/2021  Transportation Screening Complete  PCP or Specialist Appt within 3-5 Days Complete  HRI or Home Care Consult Complete  Social Work Consult for [place] Complete  Palliative Care Screening Complete  Medication  Review (Press photographer Complete  Some recent data might be hidden

## 2021-06-24 NOTE — Progress Notes (Addendum)
Inpatient Diabetes Program Recommendations  AACE/ADA: New Consensus Statement on Inpatient Glycemic Control (2015)  Target Ranges:  Prepandial:   less than 140 mg/dL      Peak postprandial:   less than 180 mg/dL (1-2 hours)      Critically ill patients:  140 - 180 mg/dL    Latest Reference Range & Units 06/24/21 00:53 06/24/21 04:08 06/24/21 07:36 06/24/21 11:58  Glucose-Capillary 70 - 99 mg/dL 119 (H) 100 (H) 225 (H)  7 units Novolog @0921  249 (H)    Home DM Meds None listed--Per Chart Review, pt was taking Lantus 28 units daily + Humalog 5 units TID an admission 05/25/2021--When d/c'd home 06/02/2021, Insulins were stopped (gets Bolus TF at home)    Current Orders: Novolog 0-20 units Q4H     MD- Note pt Getting Osmolite Bolus feeds 237 ml 6 times per day.  Having CBGs >200.  Please consider:  1. Change Novolog SSi to the following times to coincide with Bolus feeds: 6am/ 9am/ 12pm/ 3pm/ 6pm/ 9pm  2. Start Novolog tube feed boluses: Novolog 3 units 6 times per day with Tube Feeding bolus 6am/ 9am/ 12pm/ 3pm/ 6pm/ 9pm Hold if Tube Feed bolus Held for any reason     --Will follow patient during hospitalization--  Wyn Quaker RN, MSN, CDE Diabetes Coordinator Inpatient Glycemic Control Team Team Pager: [phone] (8a-5p)

## 2021-06-24 NOTE — Consult Note (Signed)
NAME:  Veronica Anderson, MRN:  [id number], DOB:  03/19/1946, LOS: 2 ADMISSION DATE:  06/25/2021  BRIEF SYNOPSIS  History of Present Illness:  11/12 ICU ADMISSION  75 y.o with significant PMH as below who presented to the ED with chief complaints of shortness of breath and altered mental status. increased shortness of breath decreased mental status.  Patient's son stated he tried to feed her but could not hold anything in her mouth and drooling from the right.  Patient's son called EMS today due to worsening symptoms.  On EMS arrival, patient was noted with sats of 78% on therefore was placed on nasal cannula at 2 L with improvement in sats to 95%.  Patient was also covered in vomit and feces  12/10 ADMISSION for increased WOB, signs of aspiration, lethargy placed on biPAP  12/12 PCCM consulted for worsening resp failure  Pertinent  Medical History  2 admission in last 2 months  Significant Hospital Events: Including procedures, antibiotic start and stop dates in addition to other pertinent events   12/10 admitted for increased SOB, resp distress, placed on biPAP 12/12 PCCM consulted for worsening resp failure     Micro Data:  12/10 VIRAL PANEL NEG  Antimicrobials:   Antibiotics Given (last 72 hours)     Date/Time Action Medication Dose Rate   06/29/2021 1051 New Bag/Given   ceFEPIme (MAXIPIME) 2 g in sodium chloride 0.9 % 100 mL IVPB 2 g 200 mL/hr   06/15/2021 1218 New Bag/Given   metroNIDAZOLE (FLAGYL) IVPB 500 mg 500 mg 100 mL/hr   07/03/2021 1357 New Bag/Given   vancomycin (VANCOCIN) IVPB 1000 mg/200 mL premix 1,000 mg 200 mL/hr   06/14/2021 1610 New Bag/Given   vancomycin (VANCOCIN) IVPB 1000 mg/200 mL premix 1,000 mg 200 mL/hr   06/22/2021 2215 New Bag/Given   ceFEPIme (MAXIPIME) 2 g in sodium chloride 0.9 % 100 mL IVPB 2 g 200 mL/hr   06/23/21 0001 New Bag/Given   metroNIDAZOLE (FLAGYL) IVPB 500 mg 500 mg 100 mL/hr   06/23/21 1050 New Bag/Given   ceFEPIme (MAXIPIME) 2 g in  sodium chloride 0.9 % 100 mL IVPB 2 g 200 mL/hr   06/23/21 1128 New Bag/Given   metroNIDAZOLE (FLAGYL) IVPB 500 mg 500 mg 100 mL/hr   06/23/21 1752 New Bag/Given   vancomycin (VANCOCIN) IVPB 1000 mg/200 mL premix 1,000 mg 200 mL/hr   06/23/21 2147 New Bag/Given   ceFEPIme (MAXIPIME) 2 g in sodium chloride 0.9 % 100 mL IVPB 2 g 200 mL/hr   06/23/21 2222 New Bag/Given   metroNIDAZOLE (FLAGYL) IVPB 500 mg 500 mg 100 mL/hr   06/24/21 0355 New Bag/Given   cefTRIAXone (ROCEPHIN) 2 g in sodium chloride 0.9 % 100 mL IVPB 2 g 200 mL/hr            Interim History / Subjective:  High risk for intubation and cardiac arrest      Objective   Blood pressure (!) 138/57, pulse 93, temperature 99.6 F (37.6 C), temperature source Axillary, resp. rate (!) 33, height 5\' 4"  (1.626 m), weight 81.9 kg, SpO2 100 %.        Intake/Output Summary (Last 24 hours) at 06/24/2021 1339 Last data filed at 06/24/2021 0600 Gross per 24 hour  Intake --  Output 400 ml  Net -400 ml   Filed Weights   06/16/2021 0949 06/23/21 0931  Weight: 90 kg 81.9 kg      REVIEW OF SYSTEMS  PATIENT IS UNABLE TO PROVIDE COMPLETE REVIEW  OF SYSTEMS DUE TO SEVERE CRITICAL ILLNESS AND TOXIC METABOLIC ENCEPHALOPATHY   PHYSICAL EXAMINATION:  GENERAL:critically ill appearing, +resp distress EYES: Pupils equal, round, reactive to light.  No scleral icterus.  MOUTH: Moist mucosal membrane.  NECK: Supple.  PULMONARY: +rhonchi, +wheezing CARDIOVASCULAR: S1 and S2.  No murmurs  GASTROINTESTINAL: Soft, nontender, -distended. Positive bowel sounds.  MUSCULOSKELETAL: No swelling, clubbing, or edema.  NEUROLOGIC: obtunded SKIN:intact,warm,dry     Labs/imaging that I havepersonally reviewed  (right click and "Reselect all SmartList Selections" daily)     ASSESSMENT AND PLAN SYNOPSIS  75 yo morbidly obese female with multiple medical issues with h/o CVA with decompensated diastolic heart failure with signs and  symptoms of aspiration  Severe ACUTE Hypoxic and Hypercapnic Respiratory Failure BiPAP as needed High risk for intubation and cardiac arrest    CARDIAC FAILURE-acute diastolic dysfunction -oxygen as needed -Lasix as tolerated -follow up cardiac enzymes as indicated   CARDIAC ICU monitoring   ACUTE KIDNEY INJURY/Renal Failure -continue Foley Catheter-assess need -Avoid nephrotoxic agents -Follow urine output, BMP -Ensure adequate renal perfusion, optimize oxygenation -Renal dose medications   Intake/Output Summary (Last 24 hours) at 06/24/2021 1339 Last data filed at 06/24/2021 0600 Gross per 24 hour  Intake --  Output 400 ml  Net -400 ml     NEUROLOGY Acute toxic metabolic encephalopathy CVA with aspiration pneumonia   INFECTIOUS DISEASE -continue antibiotics as prescribed -follow up cultures  ENDO - ICU hypoglycemic\Hyperglycemia protocol -check FSBS per protocol   GI GI PROPHYLAXIS as indicated  NUTRITIONAL STATUS DIET-->avoid feeding by mouth Constipation protocol as indicated TF as tolerated   ELECTROLYTES -follow labs as needed -replace as needed -pharmacy consultation and following   ACUTE ANEMIA- TRANSFUSE AS NEEDED CONSIDER TRANSFUSION  IF HGB<7 DVT PRX with TED/SCD's ONLY     Best practice (right click and "Reselect all SmartList Selections" daily)  Diet: NPO DVT prophylaxis: LMWH GI prophylaxis: PPI Glucose control:  SSI Yes Central venous access:  Yes, and it is still needed Arterial line:  N/A Foley:  Yes, and it is still needed Mobility:  bed rest  Code Status:  FULL Disposition:ICU  Labs   CBC: Recent Labs  Lab 06/30/2021 1003 06/23/21 0420 06/24/21 0520  WBC 33.7* 35.0* 28.5*  NEUTROABS 29.0*  --   --   HGB 10.3* 9.6* 7.6*  HCT 34.5* 31.7* 25.0*  MCV 86.3 85.0 83.6  PLT 415* 458* 562    Basic Metabolic Panel: Recent Labs  Lab 07/10/2021 1003 06/28/2021 1320 06/23/21 0420 06/23/21 1656 06/24/21 0520  NA  138 137 138  --  138  K 6.5* 5.3* 4.5  --  5.5*  CL 100 102 103  --  103  CO2 25 22 25   --  27  GLUCOSE 384* 432* 91 438* 126*  BUN 50* 48* 52*  --  62*  CREATININE 1.22* 1.10* 1.05*  --  1.12*  CALCIUM 9.1 8.5* 8.7*  --  8.5*  MG 2.1  --   --   --  1.9  PHOS 4.1  --   --   --  3.2   GFR: Estimated Creatinine Clearance: 44.9 mL/min (A) (by C-G formula based on SCr of 1.12 mg/dL (H)). Recent Labs  Lab 06/20/2021 1003 06/21/2021 1320 07/05/2021 1625 06/23/21 0420 06/24/21 0520  PROCALCITON  --   --   --  1.69  --   WBC 33.7*  --   --  35.0* 28.5*  LATICACIDVEN 3.4* 4.2* 3.7*  --   --  Liver Function Tests: Recent Labs  Lab 07/01/2021 1003 06/23/21 0420 06/24/21 0520  AST 18 13* 13*  ALT 10 12 11   ALKPHOS 75 76 67  BILITOT 1.0 0.7 0.8  PROT 7.5 6.8 6.4*  ALBUMIN 2.8* 2.5* 1.9*   No results for input(s): LIPASE, AMYLASE in the last 168 hours. Recent Labs  Lab 06/27/2021 1320  AMMONIA 15    ABG    Component Value Date/Time   PHART 7.42 06/23/2021 1835   PCO2ART 40 06/23/2021 1835   PO2ART 68 (L) 06/23/2021 1835   HCO3 25.9 06/23/2021 1835   ACIDBASEDEF 7.2 (H) 05/26/2021 0100   O2SAT 93.6 06/23/2021 1835     Coagulation Profile: Recent Labs  Lab 06/23/21 0420  INR 1.3*    Cardiac Enzymes: Recent Labs  Lab 06/19/2021 1003  CKTOTAL 27*    HbA1C: Hemoglobin A1C  Date/Time Value Ref Range Status  04/09/2014 05:22 AM 6.2 4.2 - 6.3 % Final    Comment:    The American Diabetes Association recommends that a primary goal of therapy should be <7% and that physicians should reevaluate the treatment regimen in patients with HbA1c values consistently >8%.   11/24/2013 02:56 AM 7.1 (H) 4.2 - 6.3 % Final    Comment:    The American Diabetes Association recommends that a primary goal of therapy should be <7% and that physicians should reevaluate the treatment regimen in patients with HbA1c values consistently >8%.    Hgb A1c MFr Bld  Date/Time Value Ref Range  Status  05/25/2021 03:43 PM 6.6 (H) 4.8 - 5.6 % Final    Comment:    (NOTE)         Prediabetes: 5.7 - 6.4         Diabetes: >6.4         Glycemic control for adults with diabetes: <7.0   06/28/2018 08:30 AM 5.7 (H) 4.8 - 5.6 % Final    Comment:    (NOTE) Pre diabetes:          5.7%-6.4% Diabetes:              >6.4% Glycemic control for   <7.0% adults with diabetes     CBG: Recent Labs  Lab 06/23/21 2039 06/24/21 0053 06/24/21 0408 06/24/21 0736 06/24/21 1158  GLUCAP 236* 119* 100* 225* 249*     Past Medical History:  She,  has a past medical history of Allergy, Anemia, Asthma, Cataract, CHF (NYHA class II, ACC/AHA stage C) ([place]), Chronic kidney disease, Constipation, Diabetes mellitus without complication ([place]), Edema, GERD (gastroesophageal reflux disease), Hyperlipidemia, Hypertension, Left ankle pain, Lumbago, Myocardial infarction ([employer]), Neuropathy, Neuropathy, OA (osteoarthritis), Obesity, Obstructive sleep apnea syndrome, Paresthesia, Protein calorie malnutrition ([place]), Requires supplemental oxygen, Stroke ([place]), and Vitamin D deficiency.   Surgical History:   Past Surgical History:  Procedure Laterality Date   ANKLE SURGERY Left    CATARACT EXTRACTION W/PHACO Right 12/20/2015   Procedure: CATARACT EXTRACTION PHACO AND INTRAOCULAR LENS PLACEMENT (IOC);  Surgeon: Eulogio Bear, MD;  Location: [facility] ORS;  Service: Ophthalmology;  Laterality: Right;  [place] 2.50 AP% 21.8 CDE 37.04 Fluid pack lot # [id number]   CATARACT EXTRACTION W/PHACO Left 02/14/2016   Procedure: CATARACT EXTRACTION PHACO AND INTRAOCULAR LENS PLACEMENT (IOC);  Surgeon: Eulogio Bear, MD;  Location: [facility] ORS;  Service: Ophthalmology;  Laterality: Left;  [place] 3.12 AP% 42.1 CDE 47.12 Fluid Pack lot # [id number]   COLONOSCOPY     FRACTURE SURGERY Left    ankle  IR GASTROSTOMY TUBE MOD SED  05/30/2021     Social History:   reports that she quit smoking about 26 years ago. Her smoking use included  cigarettes. She has never used smokeless tobacco. She reports that she does not drink alcohol and does not use drugs.   Family History:  Her family history includes Diabetes type II in an other family member.   Allergies Allergies  Allergen Reactions   Ace Inhibitors Cough     Home Medications  Prior to Admission medications   Medication Sig Start Date End Date Taking? Authorizing Provider  acetaminophen (TYLENOL) 325 MG tablet Take 325 mg by mouth daily as needed. 07/30/17  Yes [provider]  cloNIDine (CATAPRES - DOSED IN MG/24 HR) 0.3 mg/24hr patch PLACE 1 PATCH (0.3 MG TOTAL) ONTO THE SKIN ONCE A WEEK. Patient taking differently: Place 0.3 mg onto the skin every Sunday. 01/28/18  Yes Sowles, Drue Stager, MD  ferrous sulfate 300 (60 Fe) MG/5ML syrup Take 5 mLs (300 mg total) by mouth daily. 06/02/21 07/02/21 Yes Sharen Hones, MD  furosemide (LASIX) 40 MG tablet Place 1 tablet (40 mg total) into feeding tube daily. 06/02/21  Yes Sharen Hones, MD  gabapentin (NEURONTIN) 300 MG capsule Place 1 capsule (300 mg total) into feeding tube 2 (two) times daily. 06/02/21  Yes Sharen Hones, MD  hyoscyamine (LEVSIN SL) 0.125 MG SL tablet Place 0.125 mg under the tongue every 4 (four) hours as needed. 06/19/21  Yes [provider]  medroxyPROGESTERone (PROVERA) 10 MG tablet Take 2 tablets (20 mg total) by mouth daily. 11/18/17  Yes Gae Dry, MD  Nutritional Supplements (FEEDING SUPPLEMENT, OSMOLITE 1.2 CAL,) LIQD Place 237 mLs into feeding tube 6 (six) times daily. 06/02/21  Yes Sharen Hones, MD  oxyCODONE (ROXICODONE) 5 MG/5ML solution Place 5 mLs (5 mg total) into feeding tube every 6 (six) hours as needed for moderate pain. 06/02/21  Yes Sharen Hones, MD  pravastatin (PRAVACHOL) 40 MG tablet Place 1 tablet (40 mg total) into feeding tube daily. 06/02/21  Yes Sharen Hones, MD  desonide (DESOWEN) 0.05 % ointment Apply 1 application topically 2 (two) times daily. 10/23/17   Poulose,  Bethel Born, NP  glucose blood (ACCU-CHEK AVIVA PLUS) test strip 1 each by Other route 2 (two) times daily. Use as instructed 08/06/17   Steele Sizer, MD  Insulin Pen Needle (NOVOFINE) 32G X 6 MM MISC Inject 1 application into the skin 4 (four) times daily. 10/23/17   Poulose, Bethel Born, NP  nutrition supplement, JUVEN, (JUVEN) PACK Place 1 packet into feeding tube 2 (two) times daily between meals. Patient not taking: Reported on 06/19/2021 06/02/21   Sharen Hones, MD  nystatin cream (MYCOSTATIN) Apply 1 application topically 2 (two) times daily. 05/09/21   [provider]  pantoprazole sodium (PROTONIX) 40 mg Place 40 mg into feeding tube daily. 06/02/21   Sharen Hones, MD  potassium phosphate, monobasic, (K-PHOS ORIGINAL) 500 MG tablet Place 1 tablet (500 mg total) into feeding tube daily. Patient not taking: Reported on 06/18/2021 06/02/21   Sharen Hones, MD  Water For Irrigation, Sterile (FREE WATER) SOLN Place 100 mLs into feeding tube 6 (six) times daily. 06/02/21   Sharen Hones, MD       DVT/GI PRX  assessed I Assessed the need for Labs I Assessed the need for Foley I Assessed the need for Central Venous Line Family Discussion when available I Assessed the need for Mobilization I made an Assessment of medications to be  adjusted accordingly Safety Risk assessment completed  CASE DISCUSSED IN MULTIDISCIPLINARY ROUNDS WITH ICU TEAM     Critical Care Time devoted to patient care services described in this note is 65 minutes.   Critical care was necessary to treat /prevent imminent and life-threatening deterioration.   PATIENT WITH VERY POOR PROGNOSIS I ANTICIPATE PROLONGED ICU LOS  Patient is critically ill. Patient with Multiorgan failure and at high risk for cardiac arrest and death.    Corrin Parker, M.D.  [facility]  Medical Director [place] Director [facility] Cardio-Pulmonary Department

## 2021-06-24 NOTE — Progress Notes (Signed)
   06/24/21 1015  Clinical Encounter Type  Visited With Patient and family together  Visit Type Initial;Spiritual support;Social support  Referral From Chaplain  Consult/Referral To [place] checked in on PT to provide support to her family. PT's son and sister were at beside during visit. They both communicated having faith in God. Chaplain ministered with intentional presence, prayer, and reflective listening. Chaplain provided a safe space for expression of emotions. Chaplain will FU with family tomorrow.    Andee Poles, MDiv

## 2021-06-24 NOTE — Progress Notes (Signed)
Patient alert, responsive to voice, on HHF. Patients can follow some commands, wiggle toes/fingers. Patient started responding to questions by nodding her head. Foam applied to skin tear on sacrum, powder applied to moisture associated damage under skin fold.

## 2021-06-24 NOTE — Progress Notes (Signed)
Pharmacy - Brief Note - Blood culture result and antibiotic dosing  Streptococcus pneumoniae reported by pharmacist overnight in blood cultures was in error.  This result was for another patient in ICU.  This patient has GPR in 1/4 bottles with nothing detected on BCID and may represent a contaminant.  The antibiotics on this patient were adjusted to ceftriaxone.    Plan: Notified hospitalist of error with reported blood culture result D/c Ceftriaxone and resume vancomycin and cefepime Vancomycin 1gm IV q24h remains appropriate (not dose missed).   AUC goal 400-600 Using SCr 1.12, Vd 0.72, predicted AUC on this dose is 477.6 (Cmin 13.1) Resumed cefepime 2gm IV q12h based on current CrCl = 45 ml/min Follow renal function and cultures  Doreene Eland, PharmD, BCPS, BCIDP Work Cell: [phone] 06/24/2021 11:14 AM

## 2021-06-25 ENCOUNTER — Inpatient Hospital Stay: Payer: Medicare Other

## 2021-06-25 DIAGNOSIS — G9341 Metabolic encephalopathy: Secondary | ICD-10-CM

## 2021-06-25 DIAGNOSIS — Z7189 Other specified counseling: Secondary | ICD-10-CM

## 2021-06-25 DIAGNOSIS — A419 Sepsis, unspecified organism: Principal | ICD-10-CM

## 2021-06-25 DIAGNOSIS — J189 Pneumonia, unspecified organism: Secondary | ICD-10-CM

## 2021-06-25 LAB — MAGNESIUM: Magnesium: 1.9 mg/dL (ref 1.7–2.4)

## 2021-06-25 LAB — CBC
HCT: 21.8 % — ABNORMAL LOW (ref 36.0–46.0)
Hemoglobin: 6.6 g/dL — ABNORMAL LOW (ref 12.0–15.0)
MCH: 25.6 pg — ABNORMAL LOW (ref 26.0–34.0)
MCHC: 30.3 g/dL (ref 30.0–36.0)
MCV: 84.5 fL (ref 80.0–100.0)
Platelets: 275 10*3/uL (ref 150–400)
RBC: 2.58 MIL/uL — ABNORMAL LOW (ref 3.87–5.11)
RDW: 18.3 % — ABNORMAL HIGH (ref 11.5–15.5)
WBC: 23.3 10*3/uL — ABNORMAL HIGH (ref 4.0–10.5)
nRBC: 0 % (ref 0.0–0.2)

## 2021-06-25 LAB — URINALYSIS, ROUTINE W REFLEX MICROSCOPIC
Bilirubin Urine: NEGATIVE
Glucose, UA: NEGATIVE mg/dL
Ketones, ur: 5 mg/dL — AB
Nitrite: NEGATIVE
Protein, ur: NEGATIVE mg/dL
Specific Gravity, Urine: 1.017 (ref 1.005–1.030)
WBC, UA: >50 WBC/hpf — ABNORMAL HIGH (ref 0–5)
pH: 5 (ref 5.0–8.0)

## 2021-06-25 LAB — BASIC METABOLIC PANEL
Anion gap: 6 (ref 5–15)
BUN: 75 mg/dL — ABNORMAL HIGH (ref 8–23)
CO2: 28 mmol/L (ref 22–32)
Calcium: 8.4 mg/dL — ABNORMAL LOW (ref 8.9–10.3)
Chloride: 104 mmol/L (ref 98–111)
Creatinine, Ser: 0.85 mg/dL (ref 0.44–1.00)
GFR, Estimated: >60 mL/min (ref >60)
Glucose, Bld: 139 mg/dL — ABNORMAL HIGH (ref 70–99)
Potassium: 4.7 mmol/L (ref 3.5–5.1)
Sodium: 138 mmol/L (ref 135–145)

## 2021-06-25 LAB — PREPARE RBC (CROSSMATCH)

## 2021-06-25 LAB — PHOSPHORUS: Phosphorus: 1.6 mg/dL — ABNORMAL LOW (ref 2.5–4.6)

## 2021-06-25 LAB — STREP PNEUMONIAE URINARY ANTIGEN: Strep Pneumo Urinary Antigen: NEGATIVE

## 2021-06-25 LAB — GLUCOSE, CAPILLARY
Glucose-Capillary: 143 mg/dL — ABNORMAL HIGH (ref 70–99)
Glucose-Capillary: 198 mg/dL — ABNORMAL HIGH (ref 70–99)
Glucose-Capillary: 178 mg/dL — ABNORMAL HIGH (ref 70–99)
Glucose-Capillary: 305 mg/dL — ABNORMAL HIGH (ref 70–99)

## 2021-06-25 MED ORDER — GLYCOPYRROLATE 1 MG PO TABS
1.0000 mg | ORAL_TABLET | ORAL | Status: DC | PRN
  Filled 2021-06-25: qty 1

## 2021-06-25 MED ORDER — SODIUM CHLORIDE 0.9% IV SOLUTION: Freq: Once | INTRAVENOUS | Status: AC

## 2021-06-25 MED ORDER — ACETAMINOPHEN 650 MG RE SUPP: 650.0000 mg | Freq: Four times a day (QID) | RECTAL | Status: DC | PRN

## 2021-06-25 MED ORDER — GLYCOPYRROLATE 0.2 MG/ML IJ SOLN
0.2000 mg | INTRAMUSCULAR | Status: DC | PRN
  Filled 2021-06-25: qty 1

## 2021-06-25 MED ORDER — SODIUM PHOSPHATES 45 MMOLE/15ML IV SOLN
30.0000 mmol | Freq: Once | INTRAVENOUS | Status: AC
  Administered 2021-06-25: 30 mmol via INTRAVENOUS
  Filled 2021-06-25: qty 10

## 2021-06-25 MED ORDER — DEXTROSE 5 % IV SOLN: INTRAVENOUS | Status: DC

## 2021-06-25 MED ORDER — DIPHENHYDRAMINE HCL 50 MG/ML IJ SOLN: 25.0000 mg | INTRAMUSCULAR | Status: DC | PRN

## 2021-06-25 MED ORDER — GLYCOPYRROLATE 0.2 MG/ML IJ SOLN
0.2000 mg | INTRAMUSCULAR | Status: DC | PRN
  Administered 2021-06-25 – 2021-06-26 (×2): 0.2 mg via INTRAVENOUS
  Filled 2021-06-25 (×3): qty 1

## 2021-06-25 MED ORDER — POLYVINYL ALCOHOL 1.4 % OP SOLN
1.0000 [drp] | Freq: Four times a day (QID) | OPHTHALMIC | Status: DC | PRN
  Filled 2021-06-25: qty 15

## 2021-06-25 MED ORDER — MIDAZOLAM HCL 2 MG/2ML IJ SOLN: 2.0000 mg | INTRAMUSCULAR | Status: DC | PRN

## 2021-06-25 MED ORDER — ACETAMINOPHEN 325 MG PO TABS: 650.0000 mg | ORAL_TABLET | Freq: Four times a day (QID) | ORAL | Status: DC | PRN

## 2021-06-25 MED ORDER — MORPHINE SULFATE (PF) 2 MG/ML IV SOLN
2.0000 mg | INTRAVENOUS | Status: DC | PRN
  Administered 2021-06-25: 2 mg via INTRAVENOUS
  Administered 2021-06-26: 02:00:00 3 mg via INTRAVENOUS
  Filled 2021-06-25: qty 1
  Filled 2021-06-25: qty 2

## 2021-06-25 NOTE — Progress Notes (Signed)
NAME:  Veronica Anderson, MRN:  [id number], DOB:  11-18-1945, LOS: 3 ADMISSION DATE:  06/21/2021  History of Present Illness:  11/12 ICU ADMISSION  75 y.o with significant PMH as below who presented to the ED with chief complaints of shortness of breath and altered mental status. increased shortness of breath decreased mental status.  Patient's son stated he tried to feed her but could not hold anything in her mouth and drooling from the right.  Patient's son called EMS today due to worsening symptoms.  On EMS arrival, patient was noted with sats of 78% on therefore was placed on nasal cannula at 2 L with improvement in sats to 95%.  Patient was also covered in vomit and feces   12/10 ADMISSION for increased WOB, signs of aspiration, lethargy placed on biPAP   12/12 PCCM consulted for worsening resp failure   Pertinent  Medical History  2 admission in last 2 months   Significant Hospital Events: Including procedures, antibiotic start and stop dates in addition to other pertinent events   12/10 admitted for increased SOB, resp distress, placed on biPAP 12/12 PCCM consulted for worsening resp failure 12/13 remains on high flow [place], tolerated biPAP last night           Interim History / Subjective:  Remains high risk aspiration High risk for intubation Prognosis is poor      Objective   Blood pressure (!) 114/45, pulse 86, temperature 99.2 F (37.3 C), temperature source Oral, resp. rate 18, height 5\' 4"  (1.626 m), weight 81.9 kg, SpO2 100 %.    FiO2 (%):  [34 %-35 %] 35 %   Intake/Output Summary (Last 24 hours) at 06/25/2021 0844 Last data filed at 06/25/2021 1601 Gross per 24 hour  Intake 2273.37 ml  Output 900 ml  Net 1373.37 ml   Filed Weights   07/02/2021 0949 06/23/21 0931  Weight: 90 kg 81.9 kg      REVIEW OF SYSTEMS  PATIENT IS UNABLE TO PROVIDE COMPLETE REVIEW OF SYSTEMS DUE TO SEVERE CRITICAL ILLNESS AND TOXIC METABOLIC ENCEPHALOPATHY  ALL OTHER ROS ARE  NEGATIVE   PHYSICAL EXAMINATION:  GENERAL:critically ill appearing, +resp distress EYES: Pupils equal, round, reactive to light.  No scleral icterus.  MOUTH: Moist mucosal membrane. High flow  NECK: Supple.  PULMONARY: +rhonchi,  CARDIOVASCULAR: S1 and S2.  No murmurs  GASTROINTESTINAL: Soft, nontender, -distended. Positive bowel sounds.  MUSCULOSKELETAL: +edema.  NEUROLOGIC: obtunded SKIN:intact,warm,dry    Labs/imaging that I havepersonally reviewed  (right click and "Reselect all SmartList Selections" daily)      ASSESSMENT AND PLAN SYNOPSIS 75 yo morbidly obese female with multiple medical issues with h/o CVA with decompensated diastolic heart failure with signs and symptoms of aspiration  Severe ACUTE Hypoxic and Hypercapnic Respiratory Failure High risk for intubation High risk for aspiration Poor prognosis  Poor resp effort   CARDIAC FAILURE-acute diastolic dysfunction -oxygen as needed -Lasix as tolerated -follow up cardiac enzymes as indicated   CARDIAC ICU monitoring   ACUTE KIDNEY INJURY/Renal Failure -continue Foley Catheter-assess need -Avoid nephrotoxic agents -Follow urine output, BMP -Ensure adequate renal perfusion, optimize oxygenation -Renal dose medications   Intake/Output Summary (Last 24 hours) at 06/25/2021 0844 Last data filed at 06/25/2021 0932 Gross per 24 hour  Intake 2273.37 ml  Output 900 ml  Net 1373.37 ml     NEUROLOGY Acute toxic metabolic encephalopathy CVA with aspiration pneumonia   INFECTIOUS DISEASE -continue antibiotics as prescribed -follow up cultures  ENDO - ICU  hypoglycemic\Hyperglycemia protocol -check FSBS per protocol   GI GI PROPHYLAXIS as indicated  NUTRITIONAL STATUS DIET-->avoid feeding by mouth Constipation protocol as indicated   ELECTROLYTES -follow labs as needed -replace as needed -pharmacy consultation and following     Best practice (right click and "Reselect all  SmartList Selections" daily)  Diet:  NPO DVT prophylaxis: LMWH GI prophylaxis: PPI Foley:  Yes, and it is still needed Mobility:  bed rest  Code Status:  FULL CODE Disposition: ICU  Labs   CBC: Recent Labs  Lab 07/01/2021 1003 06/23/21 0420 06/24/21 0520 06/25/21 0426  WBC 33.7* 35.0* 28.5* 23.3*  NEUTROABS 29.0*  --   --   --   HGB 10.3* 9.6* 7.6* 6.6*  HCT 34.5* 31.7* 25.0* 21.8*  MCV 86.3 85.0 83.6 84.5  PLT 415* 458* 310 253    Basic Metabolic Panel: Recent Labs  Lab 07/04/2021 1003 06/20/2021 1320 06/23/21 0420 06/23/21 1656 06/24/21 0520 06/24/21 2030 06/25/21 0426  NA 138 137 138  --  138  --  138  K 6.5* 5.3* 4.5  --  5.5* 5.0 4.7  CL 100 102 103  --  103  --  104  CO2 25 22 25   --  27  --  28  GLUCOSE 384* 432* 91 438* 126*  --  139*  BUN 50* 48* 52*  --  62*  --  75*  CREATININE 1.22* 1.10* 1.05*  --  1.12*  --  0.85  CALCIUM 9.1 8.5* 8.7*  --  8.5*  --  8.4*  MG 2.1  --   --   --  1.9  --  1.9  PHOS 4.1  --   --   --  3.2  --  1.6*   GFR: Estimated Creatinine Clearance: 59.2 mL/min (by C-G formula based on SCr of 0.85 mg/dL). Recent Labs  Lab 06/30/2021 1003 06/14/2021 1320 07/02/2021 1625 06/23/21 0420 06/24/21 0520 06/25/21 0426  PROCALCITON  --   --   --  1.69  --   --   WBC 33.7*  --   --  35.0* 28.5* 23.3*  LATICACIDVEN 3.4* 4.2* 3.7*  --   --   --     Liver Function Tests: Recent Labs  Lab 07/10/2021 1003 06/23/21 0420 06/24/21 0520  AST 18 13* 13*  ALT 10 12 11   ALKPHOS 75 76 67  BILITOT 1.0 0.7 0.8  PROT 7.5 6.8 6.4*  ALBUMIN 2.8* 2.5* 1.9*   No results for input(s): LIPASE, AMYLASE in the last 168 hours. Recent Labs  Lab 07/01/2021 1320  AMMONIA 15    ABG    Component Value Date/Time   PHART 7.42 06/23/2021 1835   PCO2ART 40 06/23/2021 1835   PO2ART 68 (L) 06/23/2021 1835   HCO3 25.9 06/23/2021 1835   ACIDBASEDEF 7.2 (H) 05/26/2021 0100   O2SAT 93.6 06/23/2021 1835     Coagulation Profile: Recent Labs  Lab  06/23/21 0420  INR 1.3*    Cardiac Enzymes: Recent Labs  Lab 07/11/2021 1003  CKTOTAL 27*    HbA1C: Hemoglobin A1C  Date/Time Value Ref Range Status  04/09/2014 05:22 AM 6.2 4.2 - 6.3 % Final    Comment:    The American Diabetes Association recommends that a primary goal of therapy should be <7% and that physicians should reevaluate the treatment regimen in patients with HbA1c values consistently >8%.   11/24/2013 02:56 AM 7.1 (H) 4.2 - 6.3 % Final    Comment:  The American Diabetes Association recommends that a primary goal of therapy should be <7% and that physicians should reevaluate the treatment regimen in patients with HbA1c values consistently >8%.    Hgb A1c MFr Bld  Date/Time Value Ref Range Status  05/25/2021 03:43 PM 6.6 (H) 4.8 - 5.6 % Final    Comment:    (NOTE)         Prediabetes: 5.7 - 6.4         Diabetes: >6.4         Glycemic control for adults with diabetes: <7.0   06/28/2018 08:30 AM 5.7 (H) 4.8 - 5.6 % Final    Comment:    (NOTE) Pre diabetes:          5.7%-6.4% Diabetes:              >6.4% Glycemic control for   <7.0% adults with diabetes     CBG: Recent Labs  Lab 06/24/21 2023 06/24/21 2106 06/24/21 2316 06/25/21 0359 06/25/21 0736  GLUCAP 383* 389* 270* 143* 198*    Allergies Allergies  Allergen Reactions   Ace Inhibitors Cough       DVT/GI PRX  assessed I Assessed the need for Labs I Assessed the need for Foley I Assessed the need for Central Venous Line Family Discussion when available I Assessed the need for Mobilization I made an Assessment of medications to be adjusted accordingly Safety Risk assessment completed  CASE DISCUSSED IN MULTIDISCIPLINARY ROUNDS WITH ICU TEAM     Critical Care Time devoted to patient care services described in this note is 45 minutes.  Critical care was necessary to treat or prevent imminent or life-threatening deterioration.   PATIENT WITH VERY POOR PROGNOSIS I ANTICIPATE  PROLONGED ICU LOS  Patient with Multiorgan failure and at high risk for cardiac arrest and death.    Corrin Parker, M.D.  [facility]  Medical Director [place] Director [facility] Cardio-Pulmonary Department

## 2021-06-25 NOTE — Plan of Care (Signed)

## 2021-06-25 NOTE — Consult Note (Addendum)
Consultation Note Date: 06/25/2021   Patient Name: Veronica Anderson  DOB: 1946-04-01  MRN: [id number]  Age / Sex: 75 y.o., female  PCP: Tracie Harrier, MD Referring Physician: Flora Lipps, MD  Reason for Consultation: Establishing goals of care  HPI/Patient Profile: Veronica Anderson is a 75 y.o. African-American female with medical history significant for CHF, stage IV chronic kidney disease, type diabetes mellitus, hypertension, dyslipidemia and peripheral neuropathy as well as sleep apnea on home CPAP though an old CPAP machine, presented to the emergency room with acute onset of altered mental status since yesterday with associated cough productive of clear sputum as well as dyspnea with occasional wheezing.  She admits to chills without measured fever.  Clinical Assessment and Goals of Care: Patient is resting in bed with eyes closed. Her son is at bedside. He states his mother had a stroke around 27 years ago. He states he has been living with her and providing care for her since. He shares that he is in the middle of a divorce. He tells me he met his wife, and a family member offered to help out so he could be married and move into a home with her. He states around 4 years later the family member also had a stroke and was unable to help, so he had to move back in with his mother, and he and his wife decided to divorce.  We discussed her recent hospitalization and PEG placement. He states she has remained NPO. He tells me after speaking with several providers it was decided the PNA could be related to a dirty CPAP that has never had equipment changed, and lost chip where there has been no regulation of settings. He states when he asked about the chip he was told as long as it was blowing air it was fine. He tells me he cleaned it the best he could but nobody told him about changing out equipment.   We discussed  her diagnosis, prognosis, GOC, EOL wishes disposition and options.  A detailed discussion was had today regarding advanced directives.  Concepts specific to code status, artifical feeding and hydration, IV antibiotics and rehospitalization were discussed.  The difference between an aggressive medical intervention path and a comfort care path was discussed.  Values and goals of care important to patient and family were attempted to be elicited.  Discussed limitations of medical interventions to prolong quality of life in some situations and discussed the concept of human mortality.  We discussed her recent hospice. Son tells me they never wanted hospice, that what they wanted was a home health nurse to come out. He states he felt forced into choosing hospice. He wants any indicated care at this time, full code/full scope. She is bed-bound and uses a Engineer, drilling; son states she has verbalized contentment with her QOL.   He is hopeful changing the CPAP and creating settings will stop the PNA. We discussed a scenario where the PNA is not due to the CPAP, and could be due to aspiration of secretions,  if there is no other oral intake. Discussed having outpatient palliative to follow. Discussed the option of implementing hospice should she continue to develop PNA. Son is agreeable.      SUMMARY OF RECOMMENDATIONS   Full code/full scope.  Recommend outpatient palliative to continue conversations.       Primary Diagnoses: Present on Admission:  Sepsis due to pneumonia [facility])   I have reviewed the medical record, interviewed the patient and family, and examined the patient. The following aspects are pertinent.  Past Medical History:  Diagnosis Date   Allergy    Anemia    Asthma    Cataract    bilateral   CHF (NYHA class II, ACC/AHA stage C) (HCC)    Chronic kidney disease    stage IV (severe), Dr. Aleene Davidson   Constipation    Diabetes mellitus without complication (HCC)    Edema    feet/ankles    GERD (gastroesophageal reflux disease)    Hyperlipidemia    Hypertension    Left ankle pain    Lumbago    Myocardial infarction (HCC)    Neuropathy    Neuropathy    OA (osteoarthritis)    Obesity    Obstructive sleep apnea syndrome    CPAP   Paresthesia    Foot   Protein calorie malnutrition (HCC)    Requires supplemental oxygen    Stroke (HCC)    Vitamin D deficiency    Social History   Socioeconomic History   Marital status: Single    Spouse name: Not on file   Number of children: Not on file   Years of education: Not on file   Highest education level: Not on file  Occupational History   Not on file  Tobacco Use   Smoking status: Former    Types: Cigarettes    Quit date: 07/14/1994    Years since quitting: 26.9   Smokeless tobacco: Never  Vaping Use   Vaping Use: Never used  Substance and Sexual Activity   Alcohol use: No    Alcohol/week: 0.0 standard drinks   Drug use: No   Sexual activity: Not Currently    Birth control/protection: Post-menopausal  Other Topics Concern   Not on file  Social History Narrative   Not on file   Social Determinants of Health   Financial Resource Strain: Not on file  Food Insecurity: Not on file  Transportation Needs: Not on file  Physical Activity: Not on file  Stress: Not on file  Social Connections: Not on file   Family History  Problem Relation Age of Onset   Diabetes type II Other    Scheduled Meds:  Chlorhexidine Gluconate Cloth  6 each Topical Q0600   cloNIDine  0.3 mg Transdermal Q Sun   enoxaparin (LOVENOX) injection  40 mg Subcutaneous Q24H   feeding supplement (OSMOLITE 1.5 CAL)  237 mL Per Tube 6 X Daily   ferrous sulfate  300 mg Per Tube Daily   free water  120 mL Per Tube 6 X Daily   gabapentin  300 mg Per Tube BID   insulin aspart  0-20 Units Subcutaneous Q4H   insulin aspart  3 Units Subcutaneous 6 X Daily   mupirocin ointment  1 application Nasal BID   nutrition supplement (JUVEN)  1 packet Per Tube  BID BM   pantoprazole sodium  40 mg Per Tube Daily   pravastatin  40 mg Per Tube QPM   sodium chloride flush  10-40 mL Intracatheter Q12H  Continuous Infusions:  albumin human 25 g (06/25/21 0837)   ceFEPime (MAXIPIME) IV 2 g (06/25/21 0947)   sodium phosphate  Dextrose 5% IVPB 30 mmol (06/25/21 1021)   vancomycin Stopped (06/24/21 1656)   PRN Meds:.acetaminophen **OR** acetaminophen, magnesium hydroxide, metoCLOPramide (REGLAN) injection, ondansetron **OR** ondansetron (ZOFRAN) IV, oxyCODONE, sodium chloride flush, traZODone Medications Prior to Admission:  Prior to Admission medications   Medication Sig Start Date End Date Taking? Authorizing Provider  acetaminophen (TYLENOL) 325 MG tablet Take 325 mg by mouth daily as needed. 07/30/17  Yes [provider]  cloNIDine (CATAPRES - DOSED IN MG/24 HR) 0.3 mg/24hr patch PLACE 1 PATCH (0.3 MG TOTAL) ONTO THE SKIN ONCE A WEEK. Patient taking differently: Place 0.3 mg onto the skin every Sunday. 01/28/18  Yes Sowles, Drue Stager, MD  ferrous sulfate 300 (60 Fe) MG/5ML syrup Take 5 mLs (300 mg total) by mouth daily. 06/02/21 07/02/21 Yes Sharen Hones, MD  furosemide (LASIX) 40 MG tablet Place 1 tablet (40 mg total) into feeding tube daily. 06/02/21  Yes Sharen Hones, MD  gabapentin (NEURONTIN) 300 MG capsule Place 1 capsule (300 mg total) into feeding tube 2 (two) times daily. 06/02/21  Yes Sharen Hones, MD  hyoscyamine (LEVSIN SL) 0.125 MG SL tablet Place 0.125 mg under the tongue every 4 (four) hours as needed. 06/19/21  Yes [provider]  medroxyPROGESTERone (PROVERA) 10 MG tablet Take 2 tablets (20 mg total) by mouth daily. 11/18/17  Yes Gae Dry, MD  Nutritional Supplements (FEEDING SUPPLEMENT, OSMOLITE 1.2 CAL,) LIQD Place 237 mLs into feeding tube 6 (six) times daily. 06/02/21  Yes Sharen Hones, MD  oxyCODONE (ROXICODONE) 5 MG/5ML solution Place 5 mLs (5 mg total) into feeding tube every 6 (six) hours as needed for  moderate pain. 06/02/21  Yes Sharen Hones, MD  pravastatin (PRAVACHOL) 40 MG tablet Place 1 tablet (40 mg total) into feeding tube daily. 06/02/21  Yes Sharen Hones, MD  desonide (DESOWEN) 0.05 % ointment Apply 1 application topically 2 (two) times daily. 10/23/17   Poulose, Bethel Born, NP  glucose blood (ACCU-CHEK AVIVA PLUS) test strip 1 each by Other route 2 (two) times daily. Use as instructed 08/06/17   Steele Sizer, MD  Insulin Pen Needle (NOVOFINE) 32G X 6 MM MISC Inject 1 application into the skin 4 (four) times daily. 10/23/17   Poulose, Bethel Born, NP  nutrition supplement, JUVEN, (JUVEN) PACK Place 1 packet into feeding tube 2 (two) times daily between meals. Patient not taking: Reported on 06/14/2021 06/02/21   Sharen Hones, MD  nystatin cream (MYCOSTATIN) Apply 1 application topically 2 (two) times daily. 05/09/21   [provider]  pantoprazole sodium (PROTONIX) 40 mg Place 40 mg into feeding tube daily. 06/02/21   Sharen Hones, MD  potassium phosphate, monobasic, (K-PHOS ORIGINAL) 500 MG tablet Place 1 tablet (500 mg total) into feeding tube daily. Patient not taking: Reported on 06/29/2021 06/02/21   Sharen Hones, MD  Water For Irrigation, Sterile (FREE WATER) SOLN Place 100 mLs into feeding tube 6 (six) times daily. 06/02/21   Sharen Hones, MD   Allergies  Allergen Reactions   Ace Inhibitors Cough   Review of Systems  Unable to perform ROS  Physical Exam Constitutional:      Comments: Eyes closed.   Pulmonary:     Effort: Pulmonary effort is normal.    Vital Signs: BP (!) 159/58    Pulse (!) 105    Temp 98.5 F (36.9 C) (Oral)  Resp (!) 38    Ht _0  (1.626 m)    Wt 81.9 kg    SpO2 93%    BMI 30.99 kg/m  Pain Scale: CPOT   Pain Score: 0-No pain   SpO2: SpO2: 93 % O2 Device:SpO2: 93 % O2 Flow Rate: .O2 Flow Rate (L/min): 15 L/min  IO: Intake/output summary:  Intake/Output Summary (Last 24 hours) at 06/25/2021 1203 Last data filed at 06/25/2021  1100 Gross per 24 hour  Intake 2573.37 ml  Output 1220 ml  Net 1353.37 ml    LBM: Last BM Date: 06/23/21 Baseline Weight: Weight: 90 kg Most recent weight: Weight: 81.9 kg       Time In: 10:25 Time Out: 11:15 Time Total: 50 min Greater than 50%  of this time was spent counseling and coordinating care related to the above assessment and plan.  Signed by: Asencion Gowda, NP   Please contact Palliative Medicine Team phone at [phone] for questions and concerns.  For individual provider: See Shea Evans

## 2021-06-25 NOTE — Progress Notes (Addendum)
PROGRESS NOTE  Veronica Anderson UXL:[id number] DOB: [birth year] DOA: 07/01/2021 PCP: Tracie Harrier, MD   LOS: 3 days   Brief narrative:  Veronica Anderson is a 75 years old female with past medical history of CHF, stage IV CKD, type 2 diabetes, hypertension, dyslipidemia and peripheral neuropathy with sleep apnea on CPAP presented to hospital with altered mental status with cough and wheezing.  Initially patient was noted to be hypoxic with pulse ox of 84% on room air which improved to 94% on 50% FiO2 Ventimask.  Of note, patient was recently admitted to the hospital for aspiration pneumonia and sepsis and was discharged on 06/02/2021.  On this presentation, patient had a fever of 100.6 F with borderline low blood pressure at 91/54 with mild tachypnea and tachycardia.  Initial labs showed potassium of 5.3 with glucose of 432. high-sensitivity troponin I was 23.  CBC showed significant leukocytosis of 33.7 with neutrophilia and macrocytosis as well as anemia.  Influenza antigens and COVID-19 PCR came back negative.  Blood cultures were drawn.  X-ray showed a persistent right basilar opacity suspicious for pneumonia.  Right upper quadrant ultrasound showed cholelithiasis without any evidence of cholecystitis.  In the ED, patient was given IV fluids antibiotic with vancomycin cefepime and Flagyl and patient was then  was admitted to the hospital for further evaluation and treatment.  Assessment/Plan:  Principal Problem:   Sepsis due to pneumonia ([place])  Sepsis secondary to right basal pneumonia with acute hypoxic respiratory failure.  on IV cefepime, vancomycin. Blood cultures showing gram-positive rods in aerobic bottles.  We will continue with broad-spectrum antibiotics since the patient is very sick.  Patient was on BiPAP initially which was changed to high flow nasal cannula since 06/24/2021.  We will continue to monitor.  PCCM on board at this time.  Patient still has significant leukocytosis.   Urinalysis shows a large leukocytes.  Legionella and strep pneumoniae urinary antigen has been ordered today.  CBC Latest Ref Rng & Units 06/25/2021 06/24/2021 06/23/2021  WBC 4.0 - 10.5 K/uL 23.3(H) 28.5(H) 35.0(H)  Hemoglobin 12.0 - 15.0 g/dL 6.6(L) 7.6(L) 9.6(L)  Hematocrit 36.0 - 46.0 % 21.8(L) 25.0(L) 31.7(L)  Platelets 150 - 400 K/uL 275 310 458(H)      Uncontrolled type 2 diabetes mellitus with hyperglycemia and peripheral neuropathy. Continue sliding scale insulin, Accu-Cheks, diabetic diet.  Latest POC glucose of 178  Anemia.  Hemoglobin noted at 6.6 today.  Will benefit from transfusion.  No evidence of bleeding.  Could be secondary to chronic disease and anemia of inflammation with hemodilution..  Check hemoglobin in AM.   Dyslipidemia. Continue pravastatin via PEG tube..  Essential hypertension. On clonidine patch.  Patient takes Lasix 40 milligrams daily at home.  We will continue to hold Lasix for now.  Borderline hyperkalemia.   Potassium from today at 4.7.  Monitor BMP in AM.  Hypophosphatemia.  Phosphorus of 1.6.  We will replenish, Check levels in a.m.Marland Kitchen  Patient was given IV 30 mmol of K-Phos today  Nutrition.  Continue PEG tube feeding.  Obstructive sleep apnea. Will likely need CPAP new prescription on discharge since she has older CPAP and has not been changed in several years now as per the  patient's son.   Debility, profound deconditioning.  PT evaluation when patient is more stable Patient does have generalized weakness and flaccidity in her extremities.  Patient was bedbound prior to coming to the hospital  Ethics goals of care.  Pending palliative care consultation.  Of note patient  was on hospice in the last discharge home.  Currently full code.  Overall prognosis of the patient appears to be poor to me.  DVT prophylaxis: enoxaparin (LOVENOX) injection 40 mg Start: 06/18/2021 2200  Code Status: Full code   Family Communication:  I again spoke with the  patient's son at bedside.  Status is: Inpatient  Remains inpatient appropriate because: Acute hypoxic respiratory failure on high flow nasal cannula, metabolic encephalopathy, sepsis, pneumonia, profound debility and deconditioning  Consultants: Palliative care pending PCCM.  Procedures: BiPAP Transfusion of PRBC.  Anti-infectives:  Vancomycin and cefepime IV  Subjective: Today, patient was seen and examined at bedside.  Patient appears to be little more alert and is squeezing hands and opening mouth on command.  Unable to verbalize much.  Patient's son at bedside.  Vitals:   06/25/21 0645 06/25/21 0700  BP:  (!) 114/45  Pulse: 84 86  Resp: 17 18  Temp:    SpO2: 100% 100%    Intake/Output Summary (Last 24 hours) at 06/25/2021 0749 Last data filed at 06/25/2021 1448 Gross per 24 hour  Intake 2510.37 ml  Output 945 ml  Net 1565.37 ml    Filed Weights   06/30/2021 0949 06/23/21 0931  Weight: 90 kg 81.9 kg   Body mass index is 30.99 kg/m.   Physical Exam:  General: Obese built, on high flow nasal cannula, spontaneously opens eyes, squeezing hands and following few commands,  HENT:   No scleral pallor or icterus noted. Oral mucosa is moist.  Chest: Coarse breath sounds noted bilaterally  CVS: S1 &S2 heard. No murmur.  Regular rate and rhythm. Abdomen: PEG tube in place, soft nontender. Extremities: No cyanosis, clubbing or edema.  Peripheral pulses are palpable.  Flaccidity over the extremities. Psych: More alert awake, following few commands CNS:  Generalized weakness noted for flaccidity with extremities Skin: Warm and dry.  No rashes noted.  Data Review: I have personally reviewed the following laboratory data and studies,  CBC: Recent Labs  Lab 07/11/2021 1003 06/23/21 0420 06/24/21 0520 06/25/21 0426  WBC 33.7* 35.0* 28.5* 23.3*  NEUTROABS 29.0*  --   --   --   HGB 10.3* 9.6* 7.6* 6.6*  HCT 34.5* 31.7* 25.0* 21.8*  MCV 86.3 85.0 83.6 84.5  PLT 415*  458* 310 185    Basic Metabolic Panel: Recent Labs  Lab 06/24/2021 1003 07/12/2021 1320 06/23/21 0420 06/23/21 1656 06/24/21 0520 06/24/21 2030 06/25/21 0426  NA 138 137 138  --  138  --  138  K 6.5* 5.3* 4.5  --  5.5* 5.0 4.7  CL 100 102 103  --  103  --  104  CO2 25 22 25   --  27  --  28  GLUCOSE 384* 432* 91 438* 126*  --  139*  BUN 50* 48* 52*  --  62*  --  75*  CREATININE 1.22* 1.10* 1.05*  --  1.12*  --  0.85  CALCIUM 9.1 8.5* 8.7*  --  8.5*  --  8.4*  MG 2.1  --   --   --  1.9  --  1.9  PHOS 4.1  --   --   --  3.2  --  1.6*    Liver Function Tests: Recent Labs  Lab 07/09/2021 1003 06/23/21 0420 06/24/21 0520  AST 18 13* 13*  ALT 10 12 11   ALKPHOS 75 76 67  BILITOT 1.0 0.7 0.8  PROT 7.5 6.8 6.4*  ALBUMIN 2.8* 2.5* 1.9*  No results for input(s): LIPASE, AMYLASE in the last 168 hours. Recent Labs  Lab 06/30/2021 1320  AMMONIA 15    Cardiac Enzymes: Recent Labs  Lab 06/16/2021 1003  CKTOTAL 27*    BNP (last 3 results) Recent Labs    05/25/21 1310 06/16/2021 1003  BNP 103.1* 800.2*     ProBNP (last 3 results) No results for input(s): PROBNP in the last 8760 hours.  CBG: Recent Labs  Lab 06/24/21 2023 06/24/21 2106 06/24/21 2316 06/25/21 0359 06/25/21 0736  GLUCAP 383* 389* 270* 143* 198*    Recent Results (from the past 240 hour(s))  Blood culture (routine x 2)     Status: None (Preliminary result)   Collection Time: 06/21/2021 10:03 AM   Specimen: BLOOD  Result Value Ref Range Status   Specimen Description   Final    BLOOD BLOOD LEFT HAND Performed at [facility], [address] [place], [place] [address]    Special Requests   Final    BOTTLES DRAWN AEROBIC AND ANAEROBIC Blood Culture results may not be optimal due to an inadequate volume of blood received in culture bottles Performed at [facility], [address] [place], [place] [address]    Culture  Setup Time   Final    Organism ID to follow [place] TO, READ BACK BY AND VERIFIED WITH: MORGAN HICKS AT 2005 06/23/2021 GAA    Culture   Final    GRAM POSITIVE RODS TOO YOUNG TO READ Performed at [facility], [place] [address], [place], [place] [address]    Report Status PENDING  Incomplete  Blood culture (routine x 2)     Status: None (Preliminary result)   Collection Time: 07/04/2021 10:03 AM   Specimen: BLOOD  Result Value Ref Range Status   Specimen Description BLOOD BLOOD LEFT FOREARM  Final   Special Requests   Final    BOTTLES DRAWN AEROBIC AND ANAEROBIC Blood Culture results may not be optimal due to an inadequate volume of blood received in culture bottles   Culture   Final    NO GROWTH 3 DAYS Performed at [facility], [address] [place], [place] [address]    Report Status PENDING  Incomplete  Blood Culture ID Panel (Reflexed)     Status: None   Collection Time: 07/04/2021 10:03 AM  Result Value Ref Range Status   Enterococcus faecalis NOT DETECTED NOT DETECTED Final   Enterococcus Faecium NOT DETECTED NOT DETECTED Final   Listeria monocytogenes NOT DETECTED NOT DETECTED Final    Comment: GRAM STAIN RESULT READ BACK BY AND VERIFIED WITH MORGAN HICKS AT 2005 06/23/2021 GAA   Staphylococcus species NOT DETECTED NOT DETECTED Final   Staphylococcus aureus (BCID) NOT DETECTED NOT DETECTED Final   Staphylococcus epidermidis NOT DETECTED NOT DETECTED Final   Staphylococcus lugdunensis NOT DETECTED NOT DETECTED Final   Streptococcus species NOT DETECTED NOT DETECTED Final   Streptococcus agalactiae NOT DETECTED NOT DETECTED Final   Streptococcus pneumoniae NOT DETECTED NOT DETECTED Final   Streptococcus pyogenes NOT DETECTED NOT DETECTED Final   A.calcoaceticus-baumannii NOT DETECTED NOT DETECTED Final   Bacteroides fragilis NOT DETECTED NOT DETECTED Final   Enterobacterales NOT DETECTED NOT DETECTED Final   Enterobacter cloacae complex NOT DETECTED NOT DETECTED  Final   Escherichia coli NOT DETECTED NOT DETECTED Final   Klebsiella aerogenes NOT DETECTED NOT DETECTED Final   Klebsiella oxytoca NOT DETECTED NOT DETECTED Final  Klebsiella pneumoniae NOT DETECTED NOT DETECTED Final   Proteus species NOT DETECTED NOT DETECTED Final   Salmonella species NOT DETECTED NOT DETECTED Final   Serratia marcescens NOT DETECTED NOT DETECTED Final   Haemophilus influenzae NOT DETECTED NOT DETECTED Final   Neisseria meningitidis NOT DETECTED NOT DETECTED Final   Pseudomonas aeruginosa NOT DETECTED NOT DETECTED Final   Stenotrophomonas maltophilia NOT DETECTED NOT DETECTED Final   Candida albicans NOT DETECTED NOT DETECTED Final   Candida auris NOT DETECTED NOT DETECTED Final   Candida glabrata NOT DETECTED NOT DETECTED Final   Candida krusei NOT DETECTED NOT DETECTED Final   Candida parapsilosis NOT DETECTED NOT DETECTED Final   Candida tropicalis NOT DETECTED NOT DETECTED Final   Cryptococcus neoformans/gattii NOT DETECTED NOT DETECTED Final    Comment: Performed at [facility], [address] [place], [place] [address]  Resp Panel by RT-PCR (Flu A&B, Covid) Nasopharyngeal Swab     Status: None   Collection Time: 07/11/2021 10:34 AM   Specimen: Nasopharyngeal Swab; Nasopharyngeal(NP) swabs in vial transport medium  Result Value Ref Range Status   SARS Coronavirus 2 by RT PCR NEGATIVE NEGATIVE Final    Comment: (NOTE) SARS-CoV-2 target nucleic acids are NOT DETECTED.  The SARS-CoV-2 RNA is generally detectable in upper respiratory specimens during the acute phase of infection. The lowest concentration of SARS-CoV-2 viral copies this assay can detect is 138 copies/mL. A negative result does not preclude SARS-Cov-2 infection and should not be used as the sole basis for treatment or other patient management decisions. A negative result may occur with  improper specimen collection/handling, submission of specimen other than nasopharyngeal swab,  presence of viral mutation(s) within the areas targeted by this assay, and inadequate number of viral copies(<138 copies/mL). A negative result must be combined with clinical observations, patient history, and epidemiological information. The expected result is Negative.  Fact Sheet for Patients:  EntrepreneurPulse.com.au  Fact Sheet for Healthcare Providers:  IncredibleEmployment.be  This test is no t yet approved or cleared by the [place] FDA and  has been authorized for detection and/or diagnosis of SARS-CoV-2 by FDA under an Emergency Use Authorization (EUA). This EUA will remain  in effect (meaning this test can be used) for the duration of the COVID-19 declaration under Section 564(b)(1) of the Act, 21 U.S.C.section 360bbb-3(b)(1), unless the authorization is terminated  or revoked sooner.       Influenza A by PCR NEGATIVE NEGATIVE Final   Influenza B by PCR NEGATIVE NEGATIVE Final    Comment: (NOTE) The Xpert Xpress SARS-CoV-2/FLU/RSV plus assay is intended as an aid in the diagnosis of influenza from Nasopharyngeal swab specimens and should not be used as a sole basis for treatment. Nasal washings and aspirates are unacceptable for Xpert Xpress SARS-CoV-2/FLU/RSV testing.  Fact Sheet for Patients: EntrepreneurPulse.com.au  Fact Sheet for Healthcare Providers: IncredibleEmployment.be  This test is not yet approved or cleared by the [place] FDA and has been authorized for detection and/or diagnosis of SARS-CoV-2 by FDA under an Emergency Use Authorization (EUA). This EUA will remain in effect (meaning this test can be used) for the duration of the COVID-19 declaration under Section 564(b)(1) of the Act, 21 U.S.C. section 360bbb-3(b)(1), unless the authorization is terminated or revoked.  Performed at [facility], [address] [place], [place] [address]   MRSA Next Gen  by PCR, Nasal     Status: Abnormal   Collection Time: 07/08/2021  4:11 PM   Specimen: Nasal Mucosa;  Nasal Swab  Result Value Ref Range Status   MRSA by PCR Next Gen DETECTED (A) NOT DETECTED Final    Comment: RESULT CALLED TO, READ BACK BY AND VERIFIED WITH: LINDSAY BLACK 06/17/2021 @ 1742 BY SB (NOTE) The GeneXpert MRSA Assay (FDA approved for NASAL specimens only), is one component of a comprehensive MRSA colonization surveillance program. It is not intended to diagnose MRSA infection nor to guide or monitor treatment for MRSA infections. Test performance is not FDA approved in patients less than 14 years old. Performed at [facility], [address] [place], [place] [address]       Studies: No results found.   Flora Lipps, MD  [facility] 06/25/2021  If 7PM-7AM, please contact night-coverage

## 2021-06-25 NOTE — TOC Progression Note (Signed)
Transition of Care [facility]) - Progression Note    Patient Details  Name: Veronica Anderson MRN: [id number] Date of Birth: 05-19-1946  Transition of Care [facility]) CM/SW Contact  Shelbie Hutching, RN Phone Number: 06/25/2021, 12:07 PM  Clinical Narrative:    [facility]) can accept patient for RN, PT, and OT services at home.  Reached out to [employer] about the old CPAP, she gets her oxygen and her enteral feeds from [employer] but since the CPAP is so old patient will need to be re-evaluated for CPAP with new sleep study.  TOC will follow patient progress and discuss with MD what would be best when ready for DC, she may qualify for Bi Pap or NIV.     Expected Discharge Plan: [place] Barriers to Discharge: Continued Medical Work up  Expected Discharge Plan and Services Expected Discharge Plan: [place]   Discharge Planning Services: CM Consult Post Acute Care Choice: [place] arrangements for the past 2 months: Single Family Home                 DME Arranged: N/A DME Agency: NA       HH Arranged: RN, PT, OT HH Agency: [place] Date [place]: 06/24/21 Time [place]: 1628 Representative spoke with at [place]: [place] ([place]) Interventions    Readmission Risk Interventions Readmission Risk Prevention Plan 06/24/2021  Transportation Screening Complete  PCP or Specialist Appt within 3-5 Days Complete  HRI or [place] Complete  Social Work Consult for [place] Planning/Counseling Complete  Palliative Care Screening Complete  Medication Review Press photographer) Complete  Some recent data might be hidden

## 2021-06-25 NOTE — Significant Event (Addendum)
[facility] Cross Coverage Note   I received a message from nursing staff at approximately 7:28 pm: Hi. Can we please D/C all of her orders as she is now [place] only? Thank you in advance.  I reviewed the chart and confirmed that patient's family discussed with ICU consultant and has agreed to place patient on comfort care. This was also confirmed with primary attending team.   Active treatment orders have been discontinued and the goal is for patient comfort only.   Bed reassignment changed to med-surg, no telemetry placed.   Dr. Tobie Poet

## 2021-06-25 NOTE — Progress Notes (Signed)
GOALS OF CARE DISCUSSION  The Clinical status was relayed to family in detail. Son at bedside Updated and notified of patients medical condition.    Patient remains unresponsive and will not open eyes to command.   Patient is having a weak cough and struggling to remove secretions.   Patient with increased WOB and using accessory muscles to breathe Explained to family course of therapy and the modalities   HIGH RISK FOR INTUBATION AND CARDIAC ARREST  PATIENT REQUESTS [facility]SF[facility] TO [facility] or [facility].  I HAVE MADE SEVERAL CALLS TO [facility] AND [facility] AND AT THIS TIME THEY WILL NOT BE ACCEPTING THE PATIENT.   Patient with Progressive multiorgan failure with a very high probablity of a very minimal chance of meaningful recovery despite all aggressive and optimal medical therapy.  PATIENT REMAINS FULL CODE  Family DOES NOT understand the situation that patient has grave prognosis and very poor quality of life.    Family are satisfied with Plan of action and management. All questions answered  Additional CC time 35 mins   Norlan Rann Patricia Pesa, M.D.  [facility]  Medical Director [place] Director [facility] Cardio-Pulmonary Department

## 2021-06-25 NOTE — Plan of Care (Signed)
Discussed with patient in front of son plan of care for the evening, pain management and the potential need for albumin with some teach back displayed.  Problem: Education: Goal: Knowledge of General Education information will improve Description: Including pain rating scale, medication(s)/side effects and non-pharmacologic comfort measures Outcome: Progressing   Problem: Health Behavior/Discharge Planning: Goal: Ability to manage health-related needs will improve Outcome: Progressing

## 2021-06-25 NOTE — Progress Notes (Signed)
GOALS OF CARE DISCUSSION  The Clinical status was relayed to family in detail. Son at bedside Updated and notified of patients medical condition.    Patient remains unresponsive and will not open eyes to command.   Patient is having a weak cough and struggling to remove secretions.   Patient with increased WOB and using accessory muscles to breathe Explained to family course of therapy and the modalities   Patient is suffering and suffocating and dying process with suffering  Patient with Progressive multiorgan failure with a very high probablity of a very minimal chance of meaningful recovery despite all aggressive and optimal medical therapy.     The SOn has talked with rest of family and they ALL  have consented and agreed to DNR/DNI and would like to proceed with Comfort care measures.  Family are satisfied with Plan of action and management. All questions answered  Additional CC time 35 mins   Eylin Pontarelli Patricia Pesa, M.D.  [facility]  Medical Director [place] Director [facility] Cardio-Pulmonary Department

## 2021-06-26 DIAGNOSIS — Z515 Encounter for palliative care: Secondary | ICD-10-CM

## 2021-06-26 LAB — CULTURE, BLOOD (ROUTINE X 2)

## 2021-06-26 LAB — LEGIONELLA PNEUMOPHILA SEROGP 1 UR AG: L. pneumophila Serogp 1 Ur Ag: NEGATIVE

## 2021-06-26 LAB — BPAM RBC
Blood Product Expiration Date: 202301082359
ISSUE DATE / TIME: 202212131050
Unit Type and Rh: 7300

## 2021-06-26 LAB — TYPE AND SCREEN
ABO/RH(D): B POS
Antibody Screen: NEGATIVE
Unit division: 0

## 2021-06-27 LAB — CULTURE, BLOOD (ROUTINE X 2): Culture: NO GROWTH

## 2021-07-14 NOTE — Progress Notes (Signed)
Pt arrived to floor with ICU nurse and tech on a bed. Family on floor at this time. Pt agonal breathing on arrival to room. Pt pronounced by 2 RN's at 0410 with family at bedside. Honor bridge notified and pt assessed. Attending notified. Family was given emotional support and allowed to stay by patient for a period of time. Family had no questions left and  chose a funeral home, then left the floor.

## 2021-07-14 NOTE — Progress Notes (Signed)
Report given to [facility], 1C RN and patient care transferred. Comfort Care only. 2L [place] in placed. RN in the room upon patient arrival. Family at the bedside. No questions or concerns voiced.

## 2021-07-14 NOTE — Death Summary Note (Addendum)
DEATH SUMMARY   Patient Details  Name: Veronica Anderson MRN: [id number] DOB: 1945-12-11  Admission/Discharge Information   Admit Date:  07/12/21  Date of Death: Date of Death: 2021/07/16  Time of Death: Time of Death: 0410  Length of Stay: 4  Referring Physician: Tracie Harrier, MD   Reason(s) for Hospitalization  Altered mental status  Diagnoses  Preliminary cause of death:   Sepsis secondary to  pneumonia  Secondary Diagnoses (including complications and co-morbidities):  Principal Problem:   Sepsis due to pneumonia [facility]) Active Problems:   End of life care   [facility] Course (including significant findings, care, treatment, and services provided and events leading to death)  Veronica Anderson is a 76 y.o. year old female  with past medical history of CHF, stage IV CKD, type 2 diabetes, hypertension, dyslipidemia and peripheral neuropathy with sleep apnea on CPAP presented to the hospital with altered mental status with cough and wheezing.  Initially, patient was noted to be hypoxic with pulse ox of 84% on room air which improved to 94% on 50% FiO2 Ventimask.  Of note, patient was recently admitted to the hospital for aspiration pneumonia and sepsis and was discharged on 06/02/2021.  On this presentation, patient had a fever of 100.6 F with borderline low blood pressure at 91/54 with mild tachypnea and tachycardia.  Initial labs showed potassium of 5.3 with glucose of 432. high-sensitivity troponin I was 23.  CBC showed significant leukocytosis of 33.7 with neutrophilia and macrocytosis as well as anemia.  Influenza antigens and COVID-19 PCR came back negative.  Blood cultures were drawn.  X-ray showed a persistent right basilar opacity suspicious for pneumonia.  Right upper quadrant ultrasound showed cholelithiasis without any evidence of cholecystitis.  In the ED, patient was given IV fluids, antibiotic with vancomycin, cefepime and Flagyl and patient was then  was admitted to the  hospital for further evaluation and treatment.  Following conditions were addressed during hospitalization,  Sepsis secondary to right basal pneumonia with acute hypoxic respiratory failure. Patient received IV cefepime, vancomycin during hospitalization.. Blood cultures showed gram-positive rods in aerobic bottles. BiPAP initially which was changed to high flow nasal cannula since 06/24/2021.  Continued to deteriorate clinically so critical care team was consulted.  Goals of care was discussed and plan was to proceed with comfort care   Uncontrolled type 2 diabetes mellitus with hyperglycemia and peripheral neuropathy.   Anemia.   Could be secondary to chronic disease and anemia of inflammation with hemodilution.  Patient received transfusion during hospitalization.   Dyslipidemia. Continue pravastatin via PEG tube..  Essential hypertension. Patient was on clonidine patch.     Borderline hyperkalemia.   Improved.   Hypophosphatemia.  Phosphorus of 1.6.     Nutrition.  Was on PEG tube feeding.  Obstructive sleep apnea. Was on CPAP and high flow nasal cannula during hospitalization.   Debility, profound deconditioning.  Patient had generalized weakness and flaccidity in her extremities.  Patient was bedbound prior to coming to the hospital  Moderate protein calorie malnutrition. Present on admission. Seen by dietician and was on nutritional supplements during hospitalization.   Acute diastolic heart failure. Was on oxygen and lasix as tolerated.   Ethics goals of care.  Patient was on hospice in the last discharge home.  Patient continued to deteriorate during hospitalization and comfort care was initiated.  Pertinent Labs and Studies  Significant Diagnostic Studies CT HEAD WO CONTRAST (5MM)  Result Date: 2021/07/12 CLINICAL DATA:  Head trauma.  Sepsis.  Altered mental status. EXAM: CT HEAD WITHOUT CONTRAST TECHNIQUE: Contiguous axial images were obtained from the base of the  skull through the vertex without intravenous contrast. COMPARISON:  05/25/2021 FINDINGS: Brain: No evidence of acute infarction, hemorrhage, hydrocephalus, extra-axial collection or mass lesion/mass effect. Chronic right cerebellar infarct. Patchy low-density changes within the periventricular and subcortical white matter compatible with chronic microvascular ischemic change. Moderate diffuse cerebral volume loss. Vascular: Atherosclerotic calcifications involving the large vessels of the skull base. No unexpected hyperdense vessel. Skull: Normal. Negative for fracture or focal lesion. Sinuses/Orbits: Bilateral mastoid effusions, left worse than right. Left maxillary sinus mucosal thickening. Other: None. IMPRESSION: 1. No acute intracranial findings. 2. Chronic microvascular ischemic change and cerebral volume loss. 3. Bilateral mastoid effusions, left worse than right. Electronically Signed   By: Davina Poke D.O.   On: 06/15/2021 14:25   IR GASTROSTOMY TUBE MOD SED  Result Date: 05/30/2021 INDICATION: 76 year old with dysphagia. EXAM: PERCUTANEOUS GASTROSTOMY TUBE WITH FLUOROSCOPIC GUIDANCE Physician: Stephan Minister. Anselm Pancoast, MD MEDICATIONS: Ancef 2 g; Antibiotics were administered within 1 hour of the procedure. Glucagon 1 mg IV ANESTHESIA/SEDATION: Versed 0.5 mg IV; Fentanyl 25 mcg IV Moderate Sedation Time:  30 minutes The patient was continuously monitored during the procedure by the interventional radiology nurse under my direct supervision. FLUOROSCOPY TIME:  Fluoroscopy Time: 6 minutes, 40 seconds, 96.2 mGy COMPLICATIONS: None immediate. PROCEDURE: Informed consent was obtained for a percutaneous gastrostomy tube. The patient was placed on the interventional table. An orogastric tube was placed with fluoroscopic guidance. The anterior abdomen was prepped and draped in sterile fashion. Maximal barrier sterile technique was utilized including caps, mask, sterile gowns, sterile gloves, sterile drape, hand  hygiene and skin antiseptic. Stomach was inflated with air through the orogastric tube. The skin and subcutaneous tissues were anesthetized with 1% lidocaine. A 17 gauge needle was directed into the distended stomach with fluoroscopic guidance. A wire was advanced into the stomach and a T-tact was deployed. A 9-French vascular sheath was placed and the orogastric tube was snared using a [place] snare device. The orogastric tube and snare were pulled out of the patient's mouth. The snare device was connected to a 20-French gastrostomy tube. The snare device and gastrostomy tube were pulled through the patient's mouth and out the anterior abdominal wall. The gastrostomy tube was cut to an appropriate length. Contrast injection through gastrostomy tube confirmed placement within the stomach. Fluoroscopic images were obtained for documentation. The gastrostomy tube was flushed with normal saline. IMPRESSION: Successful fluoroscopic guided percutaneous gastrostomy tube placement. Electronically Signed   By: Markus Daft M.D.   On: 05/30/2021 16:21   DG Chest Portable 1 View  Result Date: 06/23/2021 CLINICAL DATA:  Shortness of breath EXAM: PORTABLE CHEST 1 VIEW COMPARISON:  05/28/2021 FINDINGS: Persistent right basilar opacity. No significant pleural effusion. No pneumothorax. Stable cardiomediastinal contours. IMPRESSION: Persistent right basilar opacity suspicious for pneumonia. Electronically Signed   By: Macy Mis M.D.   On: 06/28/2021 11:04   [place] EKG SITE RITE  Result Date: 06/28/2021 If Site Rite image not attached, placement could not be confirmed due to current cardiac rhythm.  CT CHEST ABDOMEN PELVIS WO CONTRAST  Result Date: 07/12/2021 CLINICAL DATA:  Possible sepsis. Patient discharge from hospital last week with pneumonia. Oxygen dependent. EXAM: CT CHEST, ABDOMEN AND PELVIS WITHOUT CONTRAST TECHNIQUE: Multidetector CT imaging of the chest, abdomen and pelvis was performed following the  standard protocol without IV contrast. COMPARISON:  05/25/2021 FINDINGS: CT CHEST FINDINGS Cardiovascular: Heart is normal  in size. Calcified plaque over the left main and 3 vessel coronary arteries. Thoracic aorta is normal in caliber. There is calcified plaque over the thoracic aorta. Remaining vascular structures are unremarkable. Mediastinum/Nodes: No mediastinal or hilar adenopathy visualized on this noncontrast study. Remaining mediastinal structures are unremarkable. Mild prominence of the proximal to mid esophagus containing ingested material which may be due to dysmotility versus reflux. Lungs/Pleura: Lungs are adequately inflated demonstrate interval worsening of consolidation over the right lower lobe. Interval development of right lower lobe bronchial obstruction beginning over the right mainstem bronchus. Subtle hazy nodular airspace opacification over the right upper lobe. Interval development/worsening of patchy opacification over the posterior left lower lobe. Musculoskeletal: No focal abnormality. CT ABDOMEN PELVIS FINDINGS Hepatobiliary: Moderate cholelithiasis. Liver and biliary tree are normal. Pancreas: Unremarkable.  Mild fatty atrophy. Spleen: Normal. Adrenals/Urinary Tract: Adrenal glands are normal. Kidneys are normal in size without hydronephrosis. Punctate nonobstructing stone over the lower pole right kidney. Ureters and bladder are unremarkable. Stomach/Bowel: Percutaneous gastrostomy tube in adequate position. Small bowel is normal. Appendix is normal. Mild diverticulosis of the colon. The descending colon is decompressed. Mild fecal retention over the rectosigmoid colon. Vascular/Lymphatic: Calcified plaque over the abdominal aorta which is normal in caliber. No adenopathy. Reproductive: Moderately enlarged uterus with multiple calcified fibroids as the largest fibroid measures proximally 5.7 cm. Ovaries unremarkable. Other: No significant free fluid or focal inflammatory change.  Musculoskeletal: No focal abnormality. IMPRESSION: 1. Interval worsening of consolidation over the right lower lobe with new right lower lobe bronchial obstruction beginning at the right mainstem bronchus. This is likely mostly related to postobstructive atelectasis as concomitant infection is possible. Subtle hazy nodular airspace opacification over the right upper lobe and posterior left lower lobe suggesting an infectious process and less likely inflammatory disease. 2. Moderate cholelithiasis. 3. Ingested material within the proximal to mid esophagus which may be due to dysmotility or reflux. 4. Punctate nonobstructing right renal stone. 5. Mild colonic diverticulosis. 6. Enlarged fibroid uterus. 7. Aortic atherosclerosis. Atherosclerotic coronary artery disease. Aortic Atherosclerosis (ICD10-I70.0). Electronically Signed   By: Marin Olp M.D.   On: 06/24/2021 14:38   US ABDOMEN LIMITED RUQ (LIVER/GB)  Result Date: 07/04/2021 CLINICAL DATA:  Fever EXAM: ULTRASOUND ABDOMEN LIMITED RIGHT UPPER QUADRANT COMPARISON:  05/26/2021 FINDINGS: Gallbladder: Sludge and stones noted within the gallbladder lumen. No significant gallbladder wall thickening allowing for under distension. No significant pericholecystic fluid. Sonographic Murphy sign negative per technologist. Common bile duct: Diameter: 6 mm Liver: No focal hepatic lesion. Mild nonspecific coarsening of parenchymal echogenicity. Portal vein is patent on color Doppler imaging with normal direction of blood flow towards the liver. Other: None. IMPRESSION: Redemonstration of cholelithiasis without definitive evidence of acute cholecystitis. Electronically Signed   By: Miachel Roux M.D.   On: 07/08/2021 12:06    Microbiology Recent Results (from the past 240 hour(s))  Blood culture (routine x 2)     Status: None   Collection Time: 06/17/2021 10:03 AM   Specimen: BLOOD  Result Value Ref Range Status   Specimen Description   Final    BLOOD BLOOD LEFT  HAND Performed at [facility], [address] [place], [place] [address]    Special Requests   Final    BOTTLES DRAWN AEROBIC AND ANAEROBIC Blood Culture results may not be optimal due to an inadequate volume of blood received in culture bottles Performed at [facility], [address], [place], [place] [address]    Culture  Setup Time  Final    Organism ID to follow GRAM POSITIVE RODS AEROBIC BOTTLE ONLY CRITICAL RESULT CALLED TO, READ BACK BY AND VERIFIED WITH: MORGAN HICKS AT 2005 06/23/2021 GAA    Culture   Final    Turicella otitidis Standardized susceptibility testing for this organism is not available. Performed at [facility], [place] [address] [place], [place] [address]    Report Status July 07, 2021 FINAL  Final  Blood culture (routine x 2)     Status: None   Collection Time: 07/11/2021 10:03 AM   Specimen: BLOOD  Result Value Ref Range Status   Specimen Description BLOOD BLOOD LEFT FOREARM  Final   Special Requests   Final    BOTTLES DRAWN AEROBIC AND ANAEROBIC Blood Culture results may not be optimal due to an inadequate volume of blood received in culture bottles   Culture   Final    NO GROWTH 5 DAYS Performed at [facility], [address] [place], [place] [address]    Report Status 06/27/2021 FINAL  Final  Blood Culture ID Panel (Reflexed)     Status: None   Collection Time: 07/08/2021 10:03 AM  Result Value Ref Range Status   Enterococcus faecalis NOT DETECTED NOT DETECTED Final   Enterococcus Faecium NOT DETECTED NOT DETECTED Final   Listeria monocytogenes NOT DETECTED NOT DETECTED Final    Comment: GRAM STAIN RESULT READ BACK BY AND VERIFIED WITH MORGAN HICKS AT 2005 06/23/2021 GAA   Staphylococcus species NOT DETECTED NOT DETECTED Final   Staphylococcus aureus (BCID) NOT DETECTED NOT DETECTED Final   Staphylococcus epidermidis NOT DETECTED NOT DETECTED Final   Staphylococcus lugdunensis NOT DETECTED NOT DETECTED Final    Streptococcus species NOT DETECTED NOT DETECTED Final   Streptococcus agalactiae NOT DETECTED NOT DETECTED Final   Streptococcus pneumoniae NOT DETECTED NOT DETECTED Final   Streptococcus pyogenes NOT DETECTED NOT DETECTED Final   A.calcoaceticus-baumannii NOT DETECTED NOT DETECTED Final   Bacteroides fragilis NOT DETECTED NOT DETECTED Final   Enterobacterales NOT DETECTED NOT DETECTED Final   Enterobacter cloacae complex NOT DETECTED NOT DETECTED Final   Escherichia coli NOT DETECTED NOT DETECTED Final   Klebsiella aerogenes NOT DETECTED NOT DETECTED Final   Klebsiella oxytoca NOT DETECTED NOT DETECTED Final   Klebsiella pneumoniae NOT DETECTED NOT DETECTED Final   Proteus species NOT DETECTED NOT DETECTED Final   Salmonella species NOT DETECTED NOT DETECTED Final   Serratia marcescens NOT DETECTED NOT DETECTED Final   Haemophilus influenzae NOT DETECTED NOT DETECTED Final   Neisseria meningitidis NOT DETECTED NOT DETECTED Final   Pseudomonas aeruginosa NOT DETECTED NOT DETECTED Final   Stenotrophomonas maltophilia NOT DETECTED NOT DETECTED Final   Candida albicans NOT DETECTED NOT DETECTED Final   Candida auris NOT DETECTED NOT DETECTED Final   Candida glabrata NOT DETECTED NOT DETECTED Final   Candida krusei NOT DETECTED NOT DETECTED Final   Candida parapsilosis NOT DETECTED NOT DETECTED Final   Candida tropicalis NOT DETECTED NOT DETECTED Final   Cryptococcus neoformans/gattii NOT DETECTED NOT DETECTED Final    Comment: Performed at [facility], [address] [place],  [address]  Resp Panel by RT-PCR (Flu A&B, Covid) Nasopharyngeal Swab     Status: None   Collection Time: 06/13/2021 10:34 AM   Specimen: Nasopharyngeal Swab; Nasopharyngeal(NP) swabs in vial transport medium  Result Value Ref Range Status   SARS Coronavirus 2 by RT PCR NEGATIVE NEGATIVE Final    Comment: (NOTE) SARS-CoV-2 target nucleic acids are NOT DETECTED.  The SARS-CoV-2 RNA is generally  detectable in upper respiratory specimens during the acute phase of infection. The lowest concentration of SARS-CoV-2 viral copies this assay can detect is 138 copies/mL. A negative result does not preclude SARS-Cov-2 infection and should not be used as the sole basis for treatment or other patient management decisions. A negative result may occur with  improper specimen collection/handling, submission of specimen other than nasopharyngeal swab, presence of viral mutation(s) within the areas targeted by this assay, and inadequate number of viral copies(<138 copies/mL). A negative result must be combined with clinical observations, patient history, and epidemiological information. The expected result is Negative.  Fact Sheet for Patients:  EntrepreneurPulse.com.au  Fact Sheet for Healthcare Providers:  IncredibleEmployment.be  This test is no t yet approved or cleared by the [place] FDA and  has been authorized for detection and/or diagnosis of SARS-CoV-2 by FDA under an Emergency Use Authorization (EUA). This EUA will remain  in effect (meaning this test can be used) for the duration of the COVID-19 declaration under Section 564(b)(1) of the Act, 21 U.S.C.section 360bbb-3(b)(1), unless the authorization is terminated  or revoked sooner.       Influenza A by PCR NEGATIVE NEGATIVE Final   Influenza B by PCR NEGATIVE NEGATIVE Final    Comment: (NOTE) The Xpert Xpress SARS-CoV-2/FLU/RSV plus assay is intended as an aid in the diagnosis of influenza from Nasopharyngeal swab specimens and should not be used as a sole basis for treatment. Nasal washings and aspirates are unacceptable for Xpert Xpress SARS-CoV-2/FLU/RSV testing.  Fact Sheet for Patients: EntrepreneurPulse.com.au  Fact Sheet for Healthcare Providers: IncredibleEmployment.be  This test is not yet approved or cleared by the [place] FDA  and has been authorized for detection and/or diagnosis of SARS-CoV-2 by FDA under an Emergency Use Authorization (EUA). This EUA will remain in effect (meaning this test can be used) for the duration of the COVID-19 declaration under Section 564(b)(1) of the Act, 21 U.S.C. section 360bbb-3(b)(1), unless the authorization is terminated or revoked.  Performed at [facility], [address] [place], [place] [address]   MRSA Next Gen by PCR, Nasal     Status: Abnormal   Collection Time: 06/21/2021  4:11 PM   Specimen: Nasal Mucosa; Nasal Swab  Result Value Ref Range Status   MRSA by PCR Next Gen DETECTED (A) NOT DETECTED Final    Comment: RESULT CALLED TO, READ BACK BY AND VERIFIED WITH: LINDSAY BLACK 07/11/2021 @ 1742 BY SB (NOTE) The GeneXpert MRSA Assay (FDA approved for NASAL specimens only), is one component of a comprehensive MRSA colonization surveillance program. It is not intended to diagnose MRSA infection nor to guide or monitor treatment for MRSA infections. Test performance is not FDA approved in patients less than 57 years old. Performed at [facility], [address] [place], [place] [address]     Lab Basic Metabolic Panel: Recent Labs  Lab 07/07/2021 1003 06/21/2021 1320 06/23/21 0420 06/23/21 1656 06/24/21 0520 06/24/21 2030 06/25/21 0426  NA 138 137 138  --  138  --  138  K 6.5* 5.3* 4.5  --  5.5* 5.0 4.7  CL 100 102 103  --  103  --  104  CO2 25 22 25   --  27  --  28  GLUCOSE 384* 432* 91 438* 126*  --  139*  BUN 50* 48* 52*  --  62*  --  75*  CREATININE 1.22* 1.10* 1.05*  --  1.12*  --  0.85  CALCIUM 9.1 8.5* 8.7*  --  8.5*  --  8.4*  MG 2.1  --   --   --  1.9  --  1.9  PHOS 4.1  --   --   --  3.2  --  1.6*   Liver Function Tests: Recent Labs  Lab 07/11/2021 1003 06/23/21 0420 06/24/21 0520  AST 18 13* 13*  ALT 10 12 11   ALKPHOS 75 76 67  BILITOT 1.0 0.7 0.8  PROT 7.5 6.8 6.4*  ALBUMIN 2.8* 2.5* 1.9*   No results for  input(s): LIPASE, AMYLASE in the last 168 hours. Recent Labs  Lab 07/05/2021 1320  AMMONIA 15   CBC: Recent Labs  Lab 07/10/2021 1003 06/23/21 0420 06/24/21 0520 06/25/21 0426  WBC 33.7* 35.0* 28.5* 23.3*  NEUTROABS 29.0*  --   --   --   HGB 10.3* 9.6* 7.6* 6.6*  HCT 34.5* 31.7* 25.0* 21.8*  MCV 86.3 85.0 83.6 84.5  PLT 415* 458* 310 275   Cardiac Enzymes: Recent Labs  Lab 07/01/2021 1003  CKTOTAL 27*   Sepsis Labs: Recent Labs  Lab 06/30/2021 1003 06/23/2021 1320 07/10/2021 1625 06/23/21 0420 06/24/21 0520 06/25/21 0426  PROCALCITON  --   --   --  1.69  --   --   WBC 33.7*  --   --  35.0* 28.5* 23.3*  LATICACIDVEN 3.4* 4.2* 3.7*  --   --   --     Procedures/Operations   Transfusion of PRBC BiPAP   Omer Monter 06/27/2021, 12:21 PM

## 2021-07-14 DEATH — deceased
